# Patient Record
Sex: Female | Born: 1965
Health system: Southern US, Community
[De-identification: ages and names within clinical notes are randomized; demographics above are authoritative.]

## PROBLEM LIST (undated history)

## (undated) DIAGNOSIS — K5790 Diverticulosis of intestine, part unspecified, without perforation or abscess without bleeding: Secondary | ICD-10-CM

## (undated) DIAGNOSIS — R112 Nausea with vomiting, unspecified: Secondary | ICD-10-CM

## (undated) DIAGNOSIS — I10 Essential (primary) hypertension: Secondary | ICD-10-CM

## (undated) DIAGNOSIS — M797 Fibromyalgia: Secondary | ICD-10-CM

## (undated) DIAGNOSIS — E119 Type 2 diabetes mellitus without complications: Secondary | ICD-10-CM

## (undated) DIAGNOSIS — M1990 Unspecified osteoarthritis, unspecified site: Secondary | ICD-10-CM

## (undated) DIAGNOSIS — Z9889 Other specified postprocedural states: Secondary | ICD-10-CM

## (undated) DIAGNOSIS — K56609 Unspecified intestinal obstruction, unspecified as to partial versus complete obstruction: Secondary | ICD-10-CM

## (undated) DIAGNOSIS — G4730 Sleep apnea, unspecified: Secondary | ICD-10-CM

## (undated) DIAGNOSIS — F909 Attention-deficit hyperactivity disorder, unspecified type: Secondary | ICD-10-CM

## (undated) HISTORY — DX: Sleep apnea, unspecified: G47.30

## (undated) HISTORY — PX: OOPHORECTOMY: SHX86

## (undated) HISTORY — PX: COLONOSCOPY: SHX174

## (undated) HISTORY — PX: ABDOMINAL HYSTERECTOMY: SHX81

## (undated) HISTORY — DX: Other specified postprocedural states: Z98.890

## (undated) HISTORY — DX: Diverticulosis of intestine, part unspecified, without perforation or abscess without bleeding: K57.90

## (undated) HISTORY — PX: TOTAL ABDOMINAL HYSTERECTOMY: SHX209

## (undated) HISTORY — PX: MYOMECTOMY: SHX85

## (undated) HISTORY — DX: Unspecified osteoarthritis, unspecified site: M19.90

## (undated) HISTORY — DX: Other specified postprocedural states: R11.2

## (undated) HISTORY — PX: LAPAROSCOPY: SHX197

---

## 2008-07-08 ENCOUNTER — Inpatient Hospital Stay: Payer: Self-pay | Admitting: Unknown Physician Specialty

## 2015-09-12 DIAGNOSIS — G4733 Obstructive sleep apnea (adult) (pediatric): Secondary | ICD-10-CM | POA: Insufficient documentation

## 2015-09-12 DIAGNOSIS — Z9989 Dependence on other enabling machines and devices: Secondary | ICD-10-CM

## 2015-09-12 DIAGNOSIS — Z9109 Other allergy status, other than to drugs and biological substances: Secondary | ICD-10-CM | POA: Insufficient documentation

## 2015-09-12 DIAGNOSIS — F419 Anxiety disorder, unspecified: Secondary | ICD-10-CM | POA: Insufficient documentation

## 2015-09-12 DIAGNOSIS — I10 Essential (primary) hypertension: Secondary | ICD-10-CM | POA: Insufficient documentation

## 2017-01-06 DIAGNOSIS — F331 Major depressive disorder, recurrent, moderate: Secondary | ICD-10-CM | POA: Insufficient documentation

## 2017-01-06 DIAGNOSIS — F411 Generalized anxiety disorder: Secondary | ICD-10-CM | POA: Insufficient documentation

## 2017-06-05 ENCOUNTER — Encounter: Payer: Self-pay | Admitting: Osteopathic Medicine

## 2017-06-09 ENCOUNTER — Encounter: Payer: Self-pay | Admitting: Osteopathic Medicine

## 2017-11-02 LAB — HM MAMMOGRAPHY

## 2018-02-27 ENCOUNTER — Ambulatory Visit (INDEPENDENT_AMBULATORY_CARE_PROVIDER_SITE_OTHER): Payer: No Typology Code available for payment source | Admitting: Osteopathic Medicine

## 2018-02-27 ENCOUNTER — Telehealth: Payer: Self-pay

## 2018-02-27 ENCOUNTER — Encounter: Payer: Self-pay | Admitting: Osteopathic Medicine

## 2018-02-27 VITALS — BP 138/81 | HR 66 | Temp 98.1°F | Ht 66.54 in | Wt 226.5 lb

## 2018-02-27 DIAGNOSIS — R21 Rash and other nonspecific skin eruption: Secondary | ICD-10-CM

## 2018-02-27 DIAGNOSIS — Z1211 Encounter for screening for malignant neoplasm of colon: Secondary | ICD-10-CM | POA: Insufficient documentation

## 2018-02-27 DIAGNOSIS — N8110 Cystocele, unspecified: Secondary | ICD-10-CM | POA: Insufficient documentation

## 2018-02-27 DIAGNOSIS — Z87898 Personal history of other specified conditions: Secondary | ICD-10-CM

## 2018-02-27 DIAGNOSIS — Z8659 Personal history of other mental and behavioral disorders: Secondary | ICD-10-CM | POA: Diagnosis not present

## 2018-02-27 DIAGNOSIS — G4733 Obstructive sleep apnea (adult) (pediatric): Secondary | ICD-10-CM

## 2018-02-27 DIAGNOSIS — Z8639 Personal history of other endocrine, nutritional and metabolic disease: Secondary | ICD-10-CM | POA: Diagnosis not present

## 2018-02-27 DIAGNOSIS — M2550 Pain in unspecified joint: Secondary | ICD-10-CM | POA: Insufficient documentation

## 2018-02-27 DIAGNOSIS — Z90710 Acquired absence of both cervix and uterus: Secondary | ICD-10-CM | POA: Insufficient documentation

## 2018-02-27 DIAGNOSIS — K7581 Nonalcoholic steatohepatitis (NASH): Secondary | ICD-10-CM | POA: Insufficient documentation

## 2018-02-27 DIAGNOSIS — Z9989 Dependence on other enabling machines and devices: Secondary | ICD-10-CM

## 2018-02-27 DIAGNOSIS — F900 Attention-deficit hyperactivity disorder, predominantly inattentive type: Secondary | ICD-10-CM | POA: Insufficient documentation

## 2018-02-27 DIAGNOSIS — G8929 Other chronic pain: Secondary | ICD-10-CM

## 2018-02-27 DIAGNOSIS — E039 Hypothyroidism, unspecified: Secondary | ICD-10-CM | POA: Insufficient documentation

## 2018-02-27 DIAGNOSIS — M549 Dorsalgia, unspecified: Secondary | ICD-10-CM

## 2018-02-27 HISTORY — DX: Acquired absence of both cervix and uterus: Z90.710

## 2018-02-27 HISTORY — DX: Personal history of other specified conditions: Z87.898

## 2018-02-27 MED ORDER — CLOTRIMAZOLE-BETAMETHASONE 1-0.05 % EX CREA: 1.0000 "application " | TOPICAL_CREAM | Freq: Two times a day (BID) | CUTANEOUS | 0 refills | Status: DC

## 2018-02-27 MED ORDER — HYDROCHLOROTHIAZIDE 25 MG PO TABS: 25.0000 mg | ORAL_TABLET | Freq: Every day | ORAL | 1 refills | Status: DC

## 2018-02-27 NOTE — Telephone Encounter (Signed)
Sent!

## 2018-02-27 NOTE — Progress Notes (Signed)
HPI: Melanie [name] is a 52 y.o. female who  has no past medical history on file.  she presents to [facility] today, 02/27/18,  for chief complaint of: New to establish  Pleasant new patient here to establish care.  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  No complaints to address today.  We spent some time going over previous medical history.   CARDIOVASCULAR Essential hypertension, controlled on hydrochlorothiazide 25 mg Hyperlipidemia: Per patient, last available records reviewed from 2017: Total cholesterol 248, triglyceride 308, HDL 51, LDL 135.  NEUROLOGICAL/PSYCH History of anxiety depression, later attributed mostly to attention deficit disorder.  Patient states her moods tend to go in waves/cycles throughout the year, typically worse in the darker winter months. Limited psych records available - last visit appears to be 09/04/2017 w/ Dr Antony Contras. Meds at that time Wellbutrin and Lexapro. Dx MDD, ADHD, GAD.  Hx sleep apnea, patient is using CPAP and doing well on this device.  REPRODUCTIVE Status post hysterectomy for fibroids.  History of GYN abscess/ovarian tumor.  Would also like to get set up with GYN, has some concerns about possible vaginal prolapse, mom uses a pessary, she is concerned that this might be something that she could use.  GASTROINTESTINAL Previous colonoscopy done several years ago.  Requests referral for colonoscopy for colon cancer screening purposes.  Ports history of [place]/evaded liver enzymes.  ENDOCRINE History of hypothyroidism.  Previously on Armour Thyroid, not on any thyroid replacement right now. Deviously following with [facility]. History of PCOS History of prediabetes  MUSCULOSKELETAL/RHEUM Previously followed with rheumatologist.  Intermittent joint pain.  HEENT  SKIN Rash: on L hand between 3rd and 4th digits (see photo).  Cracking, has been present  for about a week or so.  Thinks is getting worse since she has to use alcohol to wash her hands frequently at work.  Other: Patient is not particularly clear about which supplementation/over-the-counter medicines she is taking or was previously prescribed by [facility].  She states that she may be thinking about getting back on magnesium, zinc, berberine  Last available records from previous PCP 12/07/2016 reviewed. EKG ok (obtained for clearance prior to starting stimulant meds w/ psych), concern for UTI.        Past medical, surgical, social and family history reviewed:  Patient Active Problem List   Diagnosis Date Noted  . Attention deficit hyperactivity disorder (ADHD), predominantly inattentive type 02/27/2018  . Arthralgia 02/27/2018  . Vaginal prolapse 02/27/2018  . Screening for malignant neoplasm of colon 02/27/2018  . H/O: hysterectomy 02/27/2018  . History of prediabetes 02/27/2018  . History of hypothyroidism 02/27/2018  . History of depression 02/27/2018  . History of anxiety 02/27/2018  . History of ADHD 02/27/2018  . NASH (nonalcoholic steatohepatitis) 02/27/2018  . GAD (generalized anxiety disorder) 01/06/2017  . Moderate episode of recurrent major depressive disorder ([place]) 01/06/2017  . Anxiety 09/12/2015  . Environmental allergies 09/12/2015  . Essential hypertension 09/12/2015  . OSA on CPAP 09/12/2015    History reviewed. No pertinent surgical history.  Social History   Tobacco Use  . Smoking status: Never Smoker  . Smokeless tobacco: Never Used  Substance Use Topics  . Alcohol use: Not Currently    History reviewed. No pertinent family history.   Current medication list and allergy/intolerance information reviewed:    Hydrochlorothiazide 25 mg daily  Allergies  Allergen Reactions  . Sulfa Antibiotics Hives      Review  of Systems:  Constitutional:  No  fever, no chills, No recent illness, No unintentional weight changes. No significant  fatigue.   HEENT: No  headache, no vision change, no hearing change, No sore throat, No  sinus pressure  Cardiac: No  chest pain, No  pressure, No palpitations, No  Orthopnea  Respiratory:  No  shortness of breath. No  Cough  Gastrointestinal: No  abdominal pain, No  nausea, No  vomiting,  No  blood in stool, No  diarrhea, No  constipation   Musculoskeletal: No new myalgia/arthralgia  Skin: +Rash, No other wounds/concerning lesions  Genitourinary: No  incontinence, No  abnormal genital bleeding, No abnormal genital discharge  Hem/Onc: No  easy bruising/bleeding, No  abnormal lymph node  Endocrine: No cold intolerance,  No heat intolerance. No polyuria/polydipsia/polyphagia   Neurologic: No  weakness, No  dizziness, No  slurred speech/focal weakness/facial droop  Psychiatric: No  concerns with depression, No  concerns with anxiety, No sleep problems, No mood problems  Exam:  BP 138/81 (BP Location: Left Arm, Patient Position: Sitting, Cuff Size: Large)   Pulse 66   Temp 98.1 F (36.7 C) (Oral)   Ht 5' 6.54" (1.69 m)   Wt 226 lb 8 oz (102.7 kg)   BMI 35.97 kg/m   Constitutional: VS see above. General Appearance: alert, well-developed, well-nourished, NAD  Eyes: Normal lids and conjunctive, non-icteric sclera  Ears, Nose, Mouth, Throat: MMM, Normal external inspection ears/nares/mouth/lips/gums. TM normal bilaterally. Pharynx/tonsils no erythema, no exudate. Nasal mucosa normal.   Neck: No masses, trachea midline. No thyroid enlargement. No tenderness/mass appreciated. No lymphadenopathy  Respiratory: Normal respiratory effort. no wheeze, no rhonchi, no rales  Cardiovascular: S1/S2 normal, no murmur, no rub/gallop auscultated. RRR. No lower extremity edema. Pedal pulse II/IV bilaterally DP and PT. No carotid bruit or JVD. No abdominal aortic bruit.  Gastrointestinal: Nontender, no masses. No hepatomegaly, no splenomegaly. No hernia appreciated. Bowel sounds normal. Rectal  exam deferred.   Musculoskeletal: Gait normal. No clubbing/cyanosis of digits.   Neurological: Normal balance/coordination. No tremor. No cranial nerve deficit on limited exam. Motor and sensation intact and symmetric. Cerebellar reflexes intact.   Skin: warm, dry. See photo of rash: No concerning nevi or subq nodules on limited exam.    Psychiatric: Normal judgment/insight. Normal mood and affect. Oriented x3. A bit disorganized/flighty but normal thought process                    ASSESSMENT/PLAN: The primary encounter diagnosis was Rash and nonspecific skin eruption. Diagnoses of NASH (nonalcoholic steatohepatitis), History of ADHD, History of anxiety, History of depression, History of hypothyroidism, History of prediabetes, OSA on CPAP, H/O: hysterectomy, Screening for malignant neoplasm of colon, Vaginal prolapse, Arthralgia, unspecified joint, and Chronic bilateral back pain, unspecified back location were also pertinent to this visit.  Chronic medical conditions overall seems stable, will of course wait on lab results for certain things particularly thyroid levels.  Rash seems consistent with possible eczema, irritated by frequent handwashing/alcohol use at work.  Possible fungal superinfection, will treat with Lotrisone and consider biopsy/dermatology referral if not improving as expected.  Believable ADHD history.   Orders Placed This Encounter  Procedures  . CBC  . COMPLETE METABOLIC PANEL WITH GFR  . Lipid panel  . TSH  . T4, free  . Hemoglobin A1c  . Ambulatory referral to Gastroenterology  . Ambulatory referral to Obstetrics / Gynecology  . Ambulatory referral to Chiropractic       Visit summary  with medication list and pertinent instructions was printed for patient to review. All questions at time of visit were answered - patient instructed to contact office with any additional concerns. ER/RTC precautions were reviewed with the patient.   Follow-up  plan: Return for annual wellness physical sometime next few months, otherwise as needed for other medical concerns .   Please note: voice recognition software was used to produce this document, and typos may escape review. Please contact Dr. Sheppard Coil for any needed clarifications.

## 2018-02-27 NOTE — Telephone Encounter (Signed)
Pt returned to clinic - she requested meds to be transferred to [place]. I've called and cancelled rxs sent to [facility]. Pls send rxs to [facility] on file. Thanks.

## 2018-02-28 LAB — CBC
HEMATOCRIT: 43.2 % (ref 35.0–45.0)
HEMOGLOBIN: 15.1 g/dL (ref 11.7–15.5)
MCH: 30.3 pg (ref 27.0–33.0)
MCHC: 35 g/dL (ref 32.0–36.0)
MCV: 86.7 fL (ref 80.0–100.0)
MPV: 11.5 fL (ref 7.5–12.5)
Platelets: 343 10*3/uL (ref 140–400)
RBC: 4.98 10*6/uL (ref 3.80–5.10)
RDW: 11.8 % (ref 11.0–15.0)
WBC: 5.4 10*3/uL (ref 3.8–10.8)

## 2018-02-28 LAB — COMPLETE METABOLIC PANEL WITH GFR
AG RATIO: 1.3 (calc) (ref 1.0–2.5)
ALBUMIN MSPROF: 4.4 g/dL (ref 3.6–5.1)
ALT: 64 U/L — ABNORMAL HIGH (ref 6–29)
AST: 39 U/L — ABNORMAL HIGH (ref 10–35)
Alkaline phosphatase (APISO): 95 U/L (ref 33–130)
BUN: 12 mg/dL (ref 7–25)
CALCIUM: 9.8 mg/dL (ref 8.6–10.4)
CO2: 27 mmol/L (ref 20–32)
CREATININE: 0.75 mg/dL (ref 0.50–1.05)
Chloride: 102 mmol/L (ref 98–110)
GFR, EST AFRICAN AMERICAN: 108 mL/min/{1.73_m2} (ref >=60)
GFR, EST NON AFRICAN AMERICAN: 93 mL/min/{1.73_m2} (ref >=60)
GLOBULIN: 3.4 g/dL (ref 1.9–3.7)
Glucose, Bld: 112 mg/dL — ABNORMAL HIGH (ref 65–99)
POTASSIUM: 3.9 mmol/L (ref 3.5–5.3)
SODIUM: 139 mmol/L (ref 135–146)
TOTAL PROTEIN: 7.8 g/dL (ref 6.1–8.1)
Total Bilirubin: 0.6 mg/dL (ref 0.2–1.2)

## 2018-02-28 LAB — LIPID PANEL
CHOL/HDL RATIO: 5.2 (calc) — AB (ref <5.0)
Cholesterol: 283 mg/dL — ABNORMAL HIGH (ref <200)
HDL: 54 mg/dL (ref >50)
LDL Cholesterol (Calc): 179 mg/dL (calc) — ABNORMAL HIGH
NON-HDL CHOLESTEROL (CALC): 229 mg/dL — AB (ref <130)
Triglycerides: 281 mg/dL — ABNORMAL HIGH (ref <150)

## 2018-02-28 LAB — TSH: TSH: 2.81 m[IU]/L

## 2018-02-28 LAB — HEMOGLOBIN A1C
HEMOGLOBIN A1C: 6.2 %{Hb} — AB (ref <5.7)
MEAN PLASMA GLUCOSE: 131 (calc)
eAG (mmol/L): 7.3 (calc)

## 2018-02-28 LAB — T4, FREE: FREE T4: 1.1 ng/dL (ref 0.8–1.8)

## 2018-03-14 MED FILL — HYDROCHLOROTHIAZIDE 25 MG T: 25 | 90 days supply | Qty: 90 | Fill #0

## 2018-03-23 ENCOUNTER — Telehealth: Payer: Self-pay

## 2018-03-23 ENCOUNTER — Telehealth: Payer: Self-pay | Admitting: Nurse Practitioner

## 2018-03-23 DIAGNOSIS — R059 Cough, unspecified: Secondary | ICD-10-CM

## 2018-03-23 DIAGNOSIS — R05 Cough: Secondary | ICD-10-CM

## 2018-03-23 MED ORDER — BENZONATATE 100 MG PO CAPS: 100.0000 mg | ORAL_CAPSULE | Freq: Three times a day (TID) | ORAL | 0 refills | Status: DC | PRN

## 2018-03-23 NOTE — Telephone Encounter (Signed)
Pt called requesting a rx for benzonatate pills. As per pt, thinks she has URI. Pt was given a appt for 03/26/18. Pls advise if rx is appropriate. Thanks.

## 2018-03-23 NOTE — Progress Notes (Signed)

## 2018-03-24 MED ORDER — BENZONATATE 100 MG PO CAPS: 100.0000 mg | ORAL_CAPSULE | Freq: Three times a day (TID) | ORAL | 0 refills | Status: DC | PRN

## 2018-03-24 NOTE — Telephone Encounter (Signed)
Went ahead and sent medication into [facility]

## 2018-03-26 ENCOUNTER — Ambulatory Visit: Payer: Self-pay | Admitting: Osteopathic Medicine

## 2018-03-26 NOTE — Telephone Encounter (Signed)
Noted. Pt received a call from pharmacy regarding rx is ready to be pick up.

## 2018-03-27 ENCOUNTER — Telehealth: Payer: No Typology Code available for payment source | Admitting: Nurse Practitioner

## 2018-03-27 DIAGNOSIS — J00 Acute nasopharyngitis [common cold]: Secondary | ICD-10-CM

## 2018-03-27 NOTE — Progress Notes (Signed)

## 2018-03-28 ENCOUNTER — Ambulatory Visit (INDEPENDENT_AMBULATORY_CARE_PROVIDER_SITE_OTHER): Payer: No Typology Code available for payment source | Admitting: Physician Assistant

## 2018-03-28 ENCOUNTER — Encounter: Payer: Self-pay | Admitting: Physician Assistant

## 2018-03-28 VITALS — BP 130/83 | HR 82 | Temp 98.9°F | Ht 66.54 in | Wt 226.5 lb

## 2018-03-28 DIAGNOSIS — R059 Cough, unspecified: Secondary | ICD-10-CM

## 2018-03-28 DIAGNOSIS — R05 Cough: Secondary | ICD-10-CM

## 2018-03-28 DIAGNOSIS — J0100 Acute maxillary sinusitis, unspecified: Secondary | ICD-10-CM | POA: Diagnosis not present

## 2018-03-28 MED ORDER — HYDROCODONE-HOMATROPINE 5-1.5 MG/5ML PO SYRP: 5.0000 mL | ORAL_SOLUTION | Freq: Two times a day (BID) | ORAL | 0 refills | Status: DC

## 2018-03-28 MED ORDER — PREDNISONE 20 MG PO TABS: ORAL_TABLET | ORAL | 0 refills | Status: DC

## 2018-03-28 NOTE — Progress Notes (Signed)
Subjective:     Patient ID: Melanie [name], female   DOB: June 07, 1966, 52 y.o.   MRN: [id number]  HPI Patient is a 52 yo female presenting today complaining of a cough and sinus pressure. She states that she started feeling sick last Sunday and by Tuesday she had developed a cough. She had an E-visit on 03/23/18 and was told to use conservative management of ibuprofen and Tessalon Perles. She tried this but a few days later she developed congestion, sinus pain, a headache, and some ear pain. She had another E-visit yesteday (03/27/18) and was prescribed Augmentin for sinusitis. She has taken 3 doses of the Augmentin and does state that the sinus pain, ear pain, and headache have lessened. In addition to the [employer], she also tried Mucinex, sudafed, and takes an albuterol inhaler at night for the cough. She still complains of a cough, postnasal drip, and states that her throat feels irritated. She admits to some exertional SOB and states that her chest feels tight but says it might be from coughing. She denies chest pain. She denies fever or chills. She states she feels like she is not getting better, and just feels worn out and weak. 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   .. Active Ambulatory Problems    Diagnosis Date Noted  . Anxiety 09/12/2015  . Attention deficit hyperactivity disorder (ADHD), predominantly inattentive type 02/27/2018  . Environmental allergies 09/12/2015  . Essential hypertension 09/12/2015  . GAD (generalized anxiety disorder) 01/06/2017  . Moderate episode of recurrent major depressive disorder ([place]) 01/06/2017  . OSA on CPAP 09/12/2015  . Arthralgia 02/27/2018  . Vaginal prolapse 02/27/2018  . Screening for malignant neoplasm of colon 02/27/2018  . H/O: hysterectomy 02/27/2018  . History of prediabetes 02/27/2018  . History of hypothyroidism 02/27/2018  . History of depression 02/27/2018  . History of anxiety 02/27/2018  .  History of ADHD 02/27/2018  . NASH (nonalcoholic steatohepatitis) 02/27/2018   Resolved Ambulatory Problems    Diagnosis Date Noted  . No Resolved Ambulatory Problems   No Additional Past Medical History    .  Review of Systems  Constitutional: Positive for fatigue. Negative for chills and fever.  HENT: Positive for congestion, postnasal drip, rhinorrhea, sinus pressure, sinus pain and sore throat.   Respiratory: Positive for cough, chest tightness and shortness of breath.   Cardiovascular: Negative for chest pain.  Neurological: Positive for headaches.       Objective:   Physical Exam  Constitutional: She is oriented to person, place, and time. She appears well-developed and well-nourished.  HENT:  Head: Normocephalic and atraumatic.  Right Ear: Tympanic membrane and external ear normal.  Left Ear: Tympanic membrane, external ear and ear canal normal.  Nose: Rhinorrhea present.  Mouth/Throat: Posterior oropharyngeal erythema present.  Nasal mucousa is erythematous and irritated.  Cardiovascular: Normal rate and regular rhythm.  Pulmonary/Chest: Effort normal and breath sounds normal. She has no wheezes.  Neurological: She is alert and oriented to person, place, and time.  Psychiatric: She has a normal mood and affect. Her behavior is normal.       Assessment:     Marland KitchenMarland KitchenDiagnoses and all orders for this visit:  Cough -     predniSONE (DELTASONE) 20 MG tablet; One tablet twice a day for 5 days. -     HYDROcodone-homatropine (HYCODAN) 5-1.5 MG/5ML syrup; Take 5 mLs by mouth 2 (two) times daily.  Acute non-recurrent maxillary sinusitis -  predniSONE (DELTASONE) 20 MG tablet; One tablet twice a day for 5 days.       Plan:     Discussed importance of continuing to take and finish Augmentin to help clear up the lingering sinusitis. Should start to feel better with continued use of the antibiotic Prescribed oral prednisone to help alleviate the cough and reduce  inflammation. Explained that prednisone should help reduce the cough. Continue to use albuterol as needed Prescribed Hycodan for next 5 days to help with the cough at night. Instructed patient to take before bed.   Discussed importance of rest. Can use cough drops or lozenges to help reduce coughing while at work.  [place] controlled substance database reviewed without concerns.    Marland KitchenVernetta Honey PA-C, have reviewed and agree with the above documentation in it's entirety.

## 2018-03-28 NOTE — Patient Instructions (Signed)

## 2018-03-29 ENCOUNTER — Encounter: Payer: Self-pay | Admitting: Physician Assistant

## 2018-04-02 ENCOUNTER — Encounter: Payer: Self-pay | Admitting: Obstetrics & Gynecology

## 2018-04-02 ENCOUNTER — Ambulatory Visit (INDEPENDENT_AMBULATORY_CARE_PROVIDER_SITE_OTHER): Payer: No Typology Code available for payment source | Admitting: Obstetrics & Gynecology

## 2018-04-02 VITALS — BP 127/86 | HR 77 | Ht 67.0 in | Wt 225.0 lb

## 2018-04-02 DIAGNOSIS — N952 Postmenopausal atrophic vaginitis: Secondary | ICD-10-CM | POA: Diagnosis not present

## 2018-04-02 MED ORDER — ESTRADIOL 10 MCG VA TABS: ORAL_TABLET | VAGINAL | 6 refills | Status: DC

## 2018-04-02 NOTE — Progress Notes (Signed)
   Subjective:    Patient ID: Melanie [name], female    DOB: [birth year], 52 y.o.   MRN: [id number]  HPI  Pt presents for first visit.  She had a TAH BSO many years ago with dr. Clarene Essex in [place] due to a large ovarian cyst (it was not cancer).  Pt does not have a cervix, all nml paps, and cervical pathology benign on hysterectomy specimen.  Pt feels a heaviness and pain at times deep on perineum / vagina when she is pushing stretchers at work.  It hasn't happened recently.  Not present today. Pt has not taken anything for the pain.  Pt wonders if there is prolapse causing the heaviness.  No discharge.  Not sexually active at this time.   Review of Systems  Constitutional: Negative.   Respiratory: Negative.   Cardiovascular: Negative.   Gastrointestinal: Negative.   Genitourinary:       Vaginal heaviness att times  Musculoskeletal:       Pulling sensation when pushing stretchers, right side  Psychiatric/Behavioral: Negative.        Objective:   Physical Exam  Constitutional: She is oriented to person, place, and time. She appears well-developed and well-nourished. No distress.  HENT:  Head: Normocephalic and atraumatic.  Eyes: Conjunctivae are normal.  Cardiovascular: Normal rate.  Pulmonary/Chest: Effort normal.  Abdominal: Soft. Bowel sounds are normal. She exhibits no distension and no mass. There is no tenderness. There is no rebound and no guarding.  Genitourinary:  Genitourinary Comments: Tanner V No rectocele, cystocele, vaginal prolapse Uterus and adnexa surgically absent +atrohpic vaginitis  Musculoskeletal: She exhibits no edema.  Neurological: She is alert and oriented to person, place, and time.  Skin: Skin is warm and dry.  Psychiatric: She has a normal mood and affect.  Vitals reviewed.  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  Atrohpic vaginitis--Rx with vagifem 2.  If not better, can refer to pelvic PT for evaluation 3.  Pap smear not indicated due to histroy above 4.  Mammogram--I don't see one in chart; will have RN look nad call patient if has not received one.  30 minutes spent face to face with patient with >50% counseling

## 2018-04-06 ENCOUNTER — Telehealth: Payer: Self-pay

## 2018-04-06 DIAGNOSIS — F419 Anxiety disorder, unspecified: Secondary | ICD-10-CM

## 2018-04-06 DIAGNOSIS — F900 Attention-deficit hyperactivity disorder, predominantly inattentive type: Secondary | ICD-10-CM

## 2018-04-06 NOTE — Telephone Encounter (Signed)
Pt called requesting a [place] referral. Pt would like referral to be sent to our [facility] providers in our building [address] [address]). Thanks.

## 2018-04-09 NOTE — Telephone Encounter (Signed)
Spoke with Pt, she is looking for a combination of both talk therapy and medication management. Routing back to PCP.

## 2018-04-09 NOTE — Telephone Encounter (Signed)
I am happy to place the referral.  Was she specific whether she wanted to see a counselor to talk therapy, psychiatrist for medication management, or both? Referral pended

## 2018-04-09 NOTE — Telephone Encounter (Signed)
Pt has been updated. No other inquiries during call.

## 2018-04-09 NOTE — Telephone Encounter (Signed)
Okay, order is in for referral to behavioral health downstairs for counseling and for psychiatry.  Thank you!

## 2018-04-23 ENCOUNTER — Ambulatory Visit (INDEPENDENT_AMBULATORY_CARE_PROVIDER_SITE_OTHER): Payer: No Typology Code available for payment source | Admitting: Osteopathic Medicine

## 2018-04-23 DIAGNOSIS — Z23 Encounter for immunization: Secondary | ICD-10-CM | POA: Diagnosis not present

## 2018-05-17 MED FILL — ESTRADIOL 10 MCG TABS: 10 | 30 days supply | Qty: 18 | Fill #0

## 2018-05-18 ENCOUNTER — Ambulatory Visit (HOSPITAL_COMMUNITY): Payer: Self-pay | Admitting: Licensed Clinical Social Worker

## 2018-05-21 ENCOUNTER — Ambulatory Visit (HOSPITAL_COMMUNITY): Payer: Self-pay | Admitting: Psychiatry

## 2018-05-22 ENCOUNTER — Ambulatory Visit (HOSPITAL_COMMUNITY): Payer: Self-pay | Admitting: Licensed Clinical Social Worker

## 2018-05-30 ENCOUNTER — Ambulatory Visit (HOSPITAL_COMMUNITY): Payer: Self-pay | Admitting: Psychiatry

## 2018-06-04 ENCOUNTER — Encounter (HOSPITAL_COMMUNITY): Payer: Self-pay | Admitting: Psychiatry

## 2018-06-04 ENCOUNTER — Other Ambulatory Visit: Payer: Self-pay

## 2018-06-04 ENCOUNTER — Ambulatory Visit (INDEPENDENT_AMBULATORY_CARE_PROVIDER_SITE_OTHER): Payer: No Typology Code available for payment source | Admitting: Psychiatry

## 2018-06-04 VITALS — BP 144/100 | HR 89 | Ht 67.0 in | Wt 231.0 lb

## 2018-06-04 DIAGNOSIS — F900 Attention-deficit hyperactivity disorder, predominantly inattentive type: Secondary | ICD-10-CM

## 2018-06-04 DIAGNOSIS — F331 Major depressive disorder, recurrent, moderate: Secondary | ICD-10-CM | POA: Diagnosis not present

## 2018-06-04 DIAGNOSIS — F411 Generalized anxiety disorder: Secondary | ICD-10-CM | POA: Diagnosis not present

## 2018-06-04 MED ORDER — BUPROPION HCL ER (SR) 100 MG PO TB12: 100.0000 mg | ORAL_TABLET | Freq: Two times a day (BID) | ORAL | 1 refills | Status: DC

## 2018-06-04 MED FILL — buPROPion HCL ER (SR) 100 M: 100 | 30 days supply | Qty: 60 | Fill #0

## 2018-06-04 MED FILL — HYDROCHLOROTHIAZIDE 25 MG T: 25 | 90 days supply | Qty: 90 | Fill #1

## 2018-06-04 NOTE — Progress Notes (Signed)
Psychiatric Initial Adult Assessment   Patient Identification: Melanie [name] MRN:  [id number] Date of Evaluation:  06/04/2018 Referral Source: primary care Chief Complaint:   Chief Complaint    Establish Care     Visit Diagnosis:    ICD-10-CM   1. Moderate episode of recurrent major depressive disorder (HCC) F33.1   2. GAD (generalized anxiety disorder) F41.1   3. Attention deficit hyperactivity disorder (ADHD), predominantly inattentive type F90.0     History of Present Illness: 52 years old currently single Caucasian female referred by primary care physician for management of depression and anxiety. Works as Therapist, sports at [employer]   Patient has been on different medication the past but became noncompliant states Wellbutrin has helped in the past.  She is feeling down depressed a motivation decreased energy crying spells says holiday season is difficult for her because she does not have any kids States medication did help was Wellbutrin but then she stopped it.  She is currently working as a Therapist, sports at [employer] worries excessive at times she has difficulty sleeping or maintaining sleep.  She worries about her future she does not have any kids she worries about her mom and finances. Says more dysphoric as holiday season is here and gets dysphoric during this and winter time.   States she has ADHD since early years but was never treated in the past when she was a kid but then later on she has been on different medication but she believes she is still functioning at work because she tries to keep herself busy but she does get distracted at times at home cannot finish chores gets lazy and forgetful  Modifying factor mom Aggravating factors; being single no kids  Duration on and off for 5 to 10 years  Severity of depression is 4 out of 10.  10 being no depression No associated psychotic symptoms no manic symptoms currently or in the past Denies drug  Has seen Psychiatrist in  [place] for 2 years. She left her practice, later patient stopped her meds.   Past Psychiatric History: depression  Previous Psychotropic Medications: Yes   Substance Abuse History in the last 12 months:  No.  Consequences of Substance Abuse: NA  Past Medical History: History reviewed. No pertinent past medical history.  Past Surgical History:  Procedure Laterality Date  . ABDOMINAL HYSTERECTOMY    . MYOMECTOMY      Family Psychiatric History: depression. Mom has depression, sister has Depression, anxiety  Family History:  Family History  Problem Relation Age of Onset  . Lung cancer Father   . Diabetes Mother   . Skin cancer Mother     Social History:   Social History   Socioeconomic History  . Marital status: Divorced    Spouse name: Not on file  . Number of children: Not on file  . Years of education: Not on file  . Highest education level: Not on file  Occupational History  . Not on file  Social Needs  . Financial resource strain: Not on file  . Food insecurity:    Worry: Not on file    Inability: Not on file  . Transportation needs:    Medical: Not on file    Non-medical: Not on file  Tobacco Use  . Smoking status: Never Smoker  . Smokeless tobacco: Never Used  Substance and Sexual Activity  . Alcohol use: Yes    Alcohol/week: 1.0 standard drinks    Types: 1 Cans of  beer per week    Comment: occ  . Drug use: Never  . Sexual activity: Not Currently    Birth control/protection: Surgical  Lifestyle  . Physical activity:    Days per week: Not on file    Minutes per session: Not on file  . Stress: Not on file  Relationships  . Social connections:    Talks on phone: Not on file    Gets together: Not on file    Attends religious service: Not on file    Active member of club or organization: Not on file    Attends meetings of clubs or organizations: Not on file    Relationship status: Not on file  Other Topics Concern  . Not on file  Social  History Narrative  . Not on file    Additional Social History: grew up with parents, GM . Somewhat stressed growing up. DAd was alcoholic, there was bickering at home . No physical trauma Married once he was controlling so she left. No kids   Allergies:   Allergies  Allergen Reactions  . Clarithromycin Other (See Comments) and Rash    Other Reaction: Fever  . Morphine Other (See Comments)    Other Reaction: mental status alerted  . Sulfa Antibiotics Hives    Metabolic Disorder Labs: Lab Results  Component Value Date   HGBA1C 6.2 (H) 02/27/2018   MPG 131 02/27/2018   No results found for: PROLACTIN Lab Results  Component Value Date   CHOL 283 (H) 02/27/2018   TRIG 281 (H) 02/27/2018   HDL 54 02/27/2018   CHOLHDL 5.2 (H) 02/27/2018   LDLCALC 179 (H) 02/27/2018   Lab Results  Component Value Date   TSH 2.81 02/27/2018    Therapeutic Level Labs: No results found for: LITHIUM No results found for: CBMZ No results found for: VALPROATE  Current Medications: Current Outpatient Medications  Medication Sig Dispense Refill  . aspirin EC 81 MG tablet Take 81 mg by mouth daily.    . cholecalciferol (VITAMIN D3) 25 MCG (1000 UT) tablet Take 2,000 Units by mouth daily.    . Estradiol 10 MCG TABS vaginal tablet Insert one tablet vaginally nightly for 2 weeks then twice a week. 20 tablet 6  . hydrochlorothiazide (HYDRODIURIL) 25 MG tablet Take 1 tablet (25 mg total) by mouth daily. 90 tablet 1  . Omega-3 Fatty Acids (FISH OIL) 1000 MG CAPS Take 1,400 mg by mouth.    . vitamin B-12 (CYANOCOBALAMIN) 500 MCG tablet Take 2,500 mcg by mouth daily.    Marland Kitchen amoxicillin-clavulanate (AUGMENTIN) 250-125 MG tablet Take 1 tablet by mouth 3 (three) times daily.    . benzonatate (TESSALON PERLES) 100 MG capsule Take 1 capsule (100 mg total) by mouth 3 (three) times daily as needed for cough. (Patient not taking: Reported on 04/02/2018) 20 capsule 0  . buPROPion (WELLBUTRIN SR) 100 MG 12 hr tablet  Take 1 tablet (100 mg total) by mouth 2 (two) times daily. 60 tablet 1  . clotrimazole-betamethasone (LOTRISONE) cream Apply 1 application topically 2 (two) times daily. (Patient not taking: Reported on 04/02/2018) 45 g 0  . HYDROcodone-homatropine (HYCODAN) 5-1.5 MG/5ML syrup Take 5 mLs by mouth 2 (two) times daily. (Patient not taking: Reported on 04/02/2018) 50 mL 0  . predniSONE (DELTASONE) 20 MG tablet One tablet twice a day for 5 days. (Patient not taking: Reported on 04/02/2018) 10 tablet 0   No current facility-administered medications for this visit.     Musculoskeletal: Strength &  Muscle Tone: within normal limits Gait & Station: normal Patient leans: no lean  Psychiatric Specialty Exam: Review of Systems  Constitutional: Positive for malaise/fatigue.  Cardiovascular: Negative for chest pain.  Skin: Negative for rash.  Neurological: Negative for tremors.  Psychiatric/Behavioral: Positive for depression.    Blood pressure (!) 144/100, pulse 89, height 5' 7"  (1.702 m), weight 231 lb (104.8 kg).Body mass index is 36.18 kg/m.  General Appearance: Casual  Eye Contact:  Fair  Speech:  Normal Rate  Volume:  Decreased  Mood:  Depressed  Affect:  Congruent  Thought Process:  Goal Directed  Orientation:  Full (Time, Place, and Person)  Thought Content:  Logical  Suicidal Thoughts:  No  Homicidal Thoughts:  No  Memory:  Immediate;   Fair Recent;   Fair  Judgement:  Fair  Insight:  Fair  Psychomotor Activity:  Decreased  Concentration:  Concentration: Fair and Attention Span: Fair  Recall:  [employer] of Knowledge:Fair  Language: Good  Akathisia:  No  Handed:  Right  AIMS (if indicated):  not done  Assets:  Desire for Improvement  ADL's:  Intact  Cognition: WNL  Sleep:  Fair   Screenings: GAD-7     Office Visit from 06/04/2018 in [place]  Total GAD-7 Score  13    PHQ2-9     Office Visit from 06/04/2018 in [place] Office Visit from 02/27/2018 in [place]  PHQ-2 Total Score  4  1  PHQ-9 Total Score  19  8      Assessment and Plan: as follows  MDD , moderate recurrent: re start wellbutrin 166m increase to 2058min one week GAD: refer to therapy. Build up depression medication for now as it effects and makes anxiety worse  AdHD: functioning at job fair. Will keep wellbutrin  For now for adhd Says she does not have high blood pressure, had coffee and anxiety when came here today. Gets it checked by primary care. No chest pain .  More than 50% time spent in counseling and coordination of care including patient education reviewed side effects and concerns were addressed  Follow-up in 4 weeks or earlier if needed   NaMerian CapronMD 12/2/201911:40 AM

## 2018-06-06 ENCOUNTER — Ambulatory Visit (INDEPENDENT_AMBULATORY_CARE_PROVIDER_SITE_OTHER): Payer: No Typology Code available for payment source | Admitting: Licensed Clinical Social Worker

## 2018-06-06 DIAGNOSIS — F900 Attention-deficit hyperactivity disorder, predominantly inattentive type: Secondary | ICD-10-CM

## 2018-06-06 DIAGNOSIS — F411 Generalized anxiety disorder: Secondary | ICD-10-CM

## 2018-06-06 DIAGNOSIS — F331 Major depressive disorder, recurrent, moderate: Secondary | ICD-10-CM | POA: Diagnosis not present

## 2018-06-07 ENCOUNTER — Encounter (HOSPITAL_COMMUNITY): Payer: Self-pay | Admitting: Licensed Clinical Social Worker

## 2018-06-07 NOTE — Progress Notes (Signed)
Comprehensive Clinical Assessment (CCA) Note  06/07/2018 Melanie [name] [id number]  Visit Diagnosis:      ICD-10-CM   1. Moderate episode of recurrent major depressive disorder (HCC) F33.1   2. GAD (generalized anxiety disorder) F41.1   3. Attention deficit hyperactivity disorder (ADHD), predominantly inattentive type F90.0       CCA Part One  Part One has been completed on paper by the patient.  (See scanned document in Chart Review)  CCA Part Two A  Intake/Chief Complaint:  CCA Intake With Chief Complaint CCA Part Two Date: 06/06/18 CCA Part Two Time: 1514 Chief Complaint/Presenting Problem: Referred for concerns related to depression and anxiety Patients Currently Reported Symptoms/Problems: Tearful   Not wanting to get up in the morning.  Can't focus.  Mind races.  Upset with herself for not accomplishing as much as she would have liked during a period of time when she took time off work.  Notes this time of year has historically been difficult for her because it reminds her of things she doesn't have (family, children, a significant other)  Stays up until she is exhausted because otherwise she has trouble falling asleep Amount of sleep varies.  Appetite has varied, sometimes overeats and other times lacks appetite  Everything feels like it takes a big effort  Anxiety has been elevated.  Sometimes gets stuck on what ifs  Individual's Strengths: Mom is her main source of support  Has some friends  Says she is funny, caring, and can be creative and crafty Individual's Preferences: Wants to get back to her norm...feel like it doesn't take so much effort to do things.  Also wants to learn to accept things as they are. Type of Services Patient Feels Are Needed: Therapy and medication management Initial Clinical Notes/Concerns: Patient stopped taking psych meds in early summer.  Over time symptoms of depression and anxiety worsened to the point where she concluded she needed to  reestablish MH care.  Diagnosed with ADHD in the 2000s by her psychiatrist and therapist.  History of taking Ritalin and Adderall.  Has found them to be helpful.     Mental Health Symptoms Depression:  Depression: Difficulty Concentrating, Fatigue, Hopelessness, Increase/decrease in appetite, Irritability, Sleep (too much or little), Tearfulness, Worthlessness  Mania:     Anxiety:   Anxiety: Worrying, Tension, Sleep, Restlessness, Irritability, Fatigue, Difficulty concentrating  Psychosis:  Psychosis: N/A  Trauma:  Trauma: N/A  Obsessions:  Obsessions: N/A  Compulsions:  Compulsions: N/A  Inattention:  Inattention: Fails to pay attention/makes careless mistakes, Forgetful, Loses things, Poor follow-through on tasks, Does not seem to listen, Disorganized, Symptoms before age 61, Symptoms present in 2 or more settings(Has to write down things on paper to remember them)  Hyperactivity/Impulsivity:  Hyperactivity/Impulsivity: Feeling of restlessness, Difficulty waiting turn, Fidgets with hands/feet, Symptoms present before age 9, Several symptoms present in 2 of more settings  Oppositional/Defiant Behaviors:  Oppositional/Defiant Behaviors: N/A  Borderline Personality:  Emotional Irregularity: N/A  Other Mood/Personality Symptoms:      Mental Status Exam Appearance and self-care  Stature:  Stature: Average  Weight:  Weight: Obese  Clothing:  Clothing: Casual  Grooming:  Grooming: Normal  Cosmetic use:  Cosmetic Use: None  Posture/gait:  Posture/Gait: Normal  Motor activity:  Motor Activity: Not Remarkable  Sensorium  Attention:  Attention: Normal  Concentration:  Concentration: Normal  Orientation:  Orientation: X5  Recall/memory:  Recall/Memory: Normal  Affect and Mood  Affect:  Affect: Tearful  Mood:  Mood: Anxious, Depressed  Relating  Eye contact:  Eye Contact: Fleeting  Facial expression:  Facial Expression: Depressed  Attitude toward examiner:  Attitude Toward Examiner:  Cooperative  Thought and Language  Speech flow: Speech Flow: Normal  Thought content:     Preoccupation:  Preoccupations: Guilt  Hallucinations:     Organization:     Transport planner of Knowledge:  Fund of Knowledge: Average  Intelligence:  Intelligence: Average  Abstraction:  Abstraction: Normal  Judgement:  Judgement: Normal  Reality Testing:  Reality Testing: Adequate  Insight:  Insight: Fair  Decision Making:  Decision Making: Vacilates(Tends to [employer])  Social Functioning  Social Maturity:  Social Maturity: Isolates  Social Judgement:  Social Judgement: Normal  Stress  Stressors:  Stressors: Work  Coping Ability:  Coping Ability: Research officer, political party Deficits:     Supports:      Family and Psychosocial History: Family history Marital status: Single Does patient have children?: No  Childhood History:  Childhood History By whom was/is the patient raised?: Grandparents, Mother Additional childhood history information: Dad drank a lot  Mom was high strung  Sister acted out a lot  "We never had money.  It was rough."  Mom also had depression and anxiety. Patient's description of current relationship with people who raised him/her: Dad died in 11-Sep-1997.  Had lung cancer and COPD.  He had been sick for a long time.  Relationship with him had been good.    Mom "I love her to death.  She is my best friend.  Sometimes I want to pinch her head off though."  Sometimes feels like mom doesn't treat her like an adult.   Does patient have siblings?: Yes Number of Siblings: 1 Description of patient's current relationship with siblings: Sister, Maudie Mercury- 60 years older, not very close Lives in [place].   Did patient suffer any verbal/emotional/physical/sexual abuse as a child?: No Did patient suffer from severe childhood neglect?: No Has patient ever been sexually abused/assaulted/raped as an adolescent or adult?: No Was the patient ever a victim of a crime or a disaster?:  No Witnessed domestic violence?: (Parents argued a lot.  Sometimes threw things.) Has patient been effected by domestic violence as an adult?: No  CCA Part Two B  Employment/Work Situation: Employment / Work Situation Employment situation: Employed Where is patient currently employed?: [employer] as an Therapist, sports How long has patient been employed?: 6 months in current position  Education: Education Did Teacher, adult education From [employer]?: Yes Did Physicist, medical?: Yes What Type of College Degree Do you Have?: BA Did You Have Any Difficulty At School?: Yes(Notes it took a lot of effort to do well in school because of her issues with inattention) Were Any Medications Ever Prescribed For These Difficulties?: No  Religion: Religion/Spirituality Are You A Religious Person?: No  Leisure/Recreation: Leisure / Recreation Leisure and Hobbies: Too much social media  Watching TV  Wants to be more active  Sometimes chooses not to engage in activities because she doesn't want to go to them alone  Exercise/Diet: Exercise/Diet Do You Exercise?: Yes(but not too much  She is on her feet at work) Have You Gained or Lost A Significant Amount of Weight in the Past Six Months?: Yes-Gained Number of Pounds Gained: 10 Do You Follow a Special Diet?: No(Admits to too many sweets, doesn't eat beef or pork) Do You Have Any Trouble Sleeping?: Yes Explanation of Sleeping Difficulties: Trouble falling asleep  CCA Part Two C  Alcohol/Drug Use:  Alcohol / Drug Use History of alcohol / drug use?: No history of alcohol / drug abuse                      CCA Part Three  ASAM's:  Six Dimensions of Multidimensional Assessment  Dimension 1:  Acute Intoxication and/or Withdrawal Potential:     Dimension 2:  Biomedical Conditions and Complications:     Dimension 3:  Emotional, Behavioral, or Cognitive Conditions and Complications:     Dimension 4:  Readiness to Change:     Dimension 5:   Relapse, Continued use, or Continued Problem Potential:     Dimension 6:  Recovery/Living Environment:      Substance use Disorder (SUD)    Social Function:  Social Functioning Social Maturity: Isolates Social Judgement: Normal  Stress:  Stress Stressors: Work Coping Ability: Exhausted Patient Takes Medications The Way The Doctor Instructed?: Yes  Risk Assessment- Self-Harm Potential: Risk Assessment For Self-Harm Potential Thoughts of Self-Harm: No current thoughts Additional Comments for Self-Harm Potential: Denies history of self-harm  Risk Assessment -Dangerous to Others Potential: Risk Assessment For Dangerous to Others Potential Method: No Plan Additional Comments for Danger to Others Potential: Denies history of harm to others  DSM5 Diagnoses: Patient Active Problem List   Diagnosis Date Noted  . Attention deficit hyperactivity disorder (ADHD), predominantly inattentive type 02/27/2018  . Arthralgia 02/27/2018  . Screening for malignant neoplasm of colon 02/27/2018  . H/O: hysterectomy 02/27/2018  . History of prediabetes 02/27/2018  . History of hypothyroidism 02/27/2018  . History of depression 02/27/2018  . History of anxiety 02/27/2018  . History of ADHD 02/27/2018  . NASH (nonalcoholic steatohepatitis) 02/27/2018  . GAD (generalized anxiety disorder) 01/06/2017  . Moderate episode of recurrent major depressive disorder ([place]) 01/06/2017  . Anxiety 09/12/2015  . Environmental allergies 09/12/2015  . Essential hypertension 09/12/2015  . OSA on CPAP 09/12/2015      Recommendations for Services/Supports/Treatments: Recommendations for Services/Supports/Treatments Recommendations For Services/Supports/Treatments: Individual Therapy, Medication Management    Garnette Scheuermann

## 2018-06-15 ENCOUNTER — Encounter: Payer: Self-pay | Admitting: Osteopathic Medicine

## 2018-06-18 ENCOUNTER — Ambulatory Visit (INDEPENDENT_AMBULATORY_CARE_PROVIDER_SITE_OTHER): Payer: No Typology Code available for payment source | Admitting: Licensed Clinical Social Worker

## 2018-06-18 DIAGNOSIS — F900 Attention-deficit hyperactivity disorder, predominantly inattentive type: Secondary | ICD-10-CM | POA: Diagnosis not present

## 2018-06-18 DIAGNOSIS — F331 Major depressive disorder, recurrent, moderate: Secondary | ICD-10-CM | POA: Diagnosis not present

## 2018-06-18 DIAGNOSIS — F411 Generalized anxiety disorder: Secondary | ICD-10-CM | POA: Diagnosis not present

## 2018-06-18 NOTE — Progress Notes (Signed)
   THERAPIST PROGRESS NOTE  Session Time: 11:43am-12:30pm  Participation Level: Active  Behavioral Response: CasualAlert  Anxious and tearful  Type of Therapy: Individual Therapy  Treatment Goals addressed:   Interventions: Treatment planning   Suicidal/Homicidal: Denied both  Therapist Interventions: Collaborated with patient to develop her treatment plan after exploring her concerns further.  Introduced the concept of mindfulness and explained how there is potential for it to be helpful in addressing a number of issues.   Showed patient a workbook they might use called The Mindful Way Workbook.  Patient took a picture of it to see if she might want to purchase her own copy.     Summary:  Apologized for being late.  She overslept.  Had some trouble articulating what she would like to achieve in therapy.  Tearful as she described how overwhelmed she feels at times when her mind is scattered in different directions.    Not familiar with mindfulness.  Indicated she is open to learning more about it.  Hoping it will help her to feel more calm and focused.            Plan: Return in approximately two weeks.  Will educate further about mindfulness.  Diagnosis:  GAD                     MDD recurrent, moderate                      ADHD Inattentive type    Garnette Scheuermann, LCSW 06/18/2018

## 2018-07-09 ENCOUNTER — Ambulatory Visit (INDEPENDENT_AMBULATORY_CARE_PROVIDER_SITE_OTHER): Payer: No Typology Code available for payment source | Admitting: Licensed Clinical Social Worker

## 2018-07-09 ENCOUNTER — Ambulatory Visit (HOSPITAL_COMMUNITY): Payer: No Typology Code available for payment source | Admitting: Licensed Clinical Social Worker

## 2018-07-09 ENCOUNTER — Ambulatory Visit (HOSPITAL_COMMUNITY): Payer: Self-pay | Admitting: Licensed Clinical Social Worker

## 2018-07-09 DIAGNOSIS — F900 Attention-deficit hyperactivity disorder, predominantly inattentive type: Secondary | ICD-10-CM

## 2018-07-09 DIAGNOSIS — F411 Generalized anxiety disorder: Secondary | ICD-10-CM | POA: Diagnosis not present

## 2018-07-09 DIAGNOSIS — F331 Major depressive disorder, recurrent, moderate: Secondary | ICD-10-CM | POA: Diagnosis not present

## 2018-07-10 ENCOUNTER — Ambulatory Visit (HOSPITAL_COMMUNITY): Payer: No Typology Code available for payment source | Admitting: Psychiatry

## 2018-07-10 NOTE — Progress Notes (Signed)
   THERAPIST PROGRESS NOTE  Session Time: 2:11pm-3:04pm  Participation Level: Active  Behavioral Response: CasualAlert  Anxious and tearful  Type of Therapy: Individual Therapy  Treatment Goals addressed: Reduce anxiety and increase satisfaction with accomplishments  Interventions: Mindfulness   Suicidal/Homicidal: Denied both  Therapist Interventions: Returned to the topic of mindfulness.  Emphasized how learning to focus on the present can help you to feel more in control of your emotions.  Explained how it can be useful to practice at times when you catch yourself having unhelpful thoughts.  Had patient watch a couple videos about mindfulness including one describing the practice of meditation.  Recommended a daily practice of mindfulness for 5-10 minutes.        Summary:  Reported her anxiety remains elevated and her thoughts are scattered.  She was tearful as she talked about how exhausted she feels.  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  She wonders if it is a good fit for her.   Noted feeling as though she is not accomplishing the things that need to get done. Appeared to be skeptical about the potential to train her brain to slow down and let go of judgment.  Willing to learn more about how to apply mindfulness though.               Plan: Return in approximately two weeks.  Will explain different ways of practicing mindfulness.  Teach a focused breathing exercise. Treatment plan review is due 09/17/18  Diagnosis:  GAD                     MDD recurrent, moderate                      ADHD Inattentive type    Garnette Scheuermann, LCSW 07/10/2018

## 2018-07-17 ENCOUNTER — Encounter

## 2018-07-17 ENCOUNTER — Ambulatory Visit (INDEPENDENT_AMBULATORY_CARE_PROVIDER_SITE_OTHER): Payer: No Typology Code available for payment source | Admitting: Psychiatry

## 2018-07-17 ENCOUNTER — Encounter (HOSPITAL_COMMUNITY): Payer: Self-pay | Admitting: Psychiatry

## 2018-07-17 VITALS — BP 136/88 | HR 75 | Ht 67.0 in | Wt 228.0 lb

## 2018-07-17 DIAGNOSIS — F331 Major depressive disorder, recurrent, moderate: Secondary | ICD-10-CM | POA: Diagnosis not present

## 2018-07-17 DIAGNOSIS — F900 Attention-deficit hyperactivity disorder, predominantly inattentive type: Secondary | ICD-10-CM

## 2018-07-17 DIAGNOSIS — F411 Generalized anxiety disorder: Secondary | ICD-10-CM

## 2018-07-17 MED ORDER — ESCITALOPRAM OXALATE 10 MG PO TABS: 10.0000 mg | ORAL_TABLET | Freq: Every day | ORAL | 0 refills | Status: DC

## 2018-07-17 MED ORDER — AMPHETAMINE-DEXTROAMPHETAMINE 10 MG PO TABS: 10.0000 mg | ORAL_TABLET | Freq: Every day | ORAL | 0 refills | Status: DC

## 2018-07-17 NOTE — Progress Notes (Signed)
[facility] Outpatient Follow up visit   Patient Identification: Melanie [name] MRN:  [id number] Date of Evaluation:  07/17/2018 Referral Source: primary care Chief Complaint:    Visit Diagnosis:    ICD-10-CM   1. Moderate episode of recurrent major depressive disorder (HCC) F33.1   2. GAD (generalized anxiety disorder) F41.1   3. Attention deficit hyperactivity disorder (ADHD), predominantly inattentive type F90.0     History of Present Illness: 53 years old currently single Caucasian female referred by primary care physician for management of depression and anxiety. Works as Therapist, sports at [employer]   Patient has been on different medication in the past but noncompliant history. We started Wellbutrin last time because she felt it has helped some in the past.  She is endorsing some increased anxiety she did not continue for long  She has initially mentioned that she feels lonely because of having no kids.  As of now she brought some notes that she is distracted forgetful she cannot get things done at home doing her job although this is 13 hours she is able to function because she keeps herself busy but in general at home she gets more distracted and forgetful that upsets her makes her frustrated and that adds to her depression anxiety. She is working therapy but does not feel that it is a good fit or she does not feel that she is able to get her point across over follow-up the modality of therapy as of now  And may discuss it t in the next 1-1 session Says more dysphoric as holiday season is here and gets dysphoric but some better since holiday season is gone.    Modifying factor mom Aggravating factors; being single no kids  Duration on and off for 5 to 10 years  Severity of depression 5/10 No associated psychotic symptoms no manic symptoms currently or in the past Denies drug use   Past Psychiatric History: depression  Previous Psychotropic Medications: Yes   Substance Abuse  History in the last 12 months:  No.  Consequences of Substance Abuse: NA  Past Medical History: History reviewed. No pertinent past medical history.  Past Surgical History:  Procedure Laterality Date  . ABDOMINAL HYSTERECTOMY    . MYOMECTOMY      Family Psychiatric History: depression. Mom has depression, sister has Depression, anxiety  Family History:  Family History  Problem Relation Age of Onset  . Lung cancer Father   . Diabetes Mother   . Skin cancer Mother     Social History:   Social History   Socioeconomic History  . Marital status: Divorced    Spouse name: Not on file  . Number of children: Not on file  . Years of education: Not on file  . Highest education level: Not on file  Occupational History  . Not on file  Social Needs  . Financial resource strain: Not on file  . Food insecurity:    Worry: Not on file    Inability: Not on file  . Transportation needs:    Medical: Not on file    Non-medical: Not on file  Tobacco Use  . Smoking status: Never Smoker  . Smokeless tobacco: Never Used  Substance and Sexual Activity  . Alcohol use: Yes    Alcohol/week: 1.0 standard drinks    Types: 1 Cans of beer per week    Comment: occ  . Drug use: Never  . Sexual activity: Not Currently    Birth control/protection: Surgical  Lifestyle  . Physical activity:    Days per week: Not on file    Minutes per session: Not on file  . Stress: Not on file  Relationships  . Social connections:    Talks on phone: Not on file    Gets together: Not on file    Attends religious service: Not on file    Active member of club or organization: Not on file    Attends meetings of clubs or organizations: Not on file    Relationship status: Not on file  Other Topics Concern  . Not on file  Social History Narrative  . Not on file      Allergies:   Allergies  Allergen Reactions  . Clarithromycin Other (See Comments) and Rash    Other Reaction: Fever  . Morphine Other  (See Comments)    Other Reaction: mental status alerted  . Sulfa Antibiotics Hives    Metabolic Disorder Labs: Lab Results  Component Value Date   HGBA1C 6.2 (H) 02/27/2018   MPG 131 02/27/2018   No results found for: PROLACTIN Lab Results  Component Value Date   CHOL 283 (H) 02/27/2018   TRIG 281 (H) 02/27/2018   HDL 54 02/27/2018   CHOLHDL 5.2 (H) 02/27/2018   LDLCALC 179 (H) 02/27/2018   Lab Results  Component Value Date   TSH 2.81 02/27/2018    Therapeutic Level Labs: No results found for: LITHIUM No results found for: CBMZ No results found for: VALPROATE  Current Medications: Current Outpatient Medications  Medication Sig Dispense Refill  . 5-Hydroxytryptophan (5-HTP MAXIMUM STRENGTH PO) Take by mouth.    Marland Kitchen aspirin EC 81 MG tablet Take 81 mg by mouth daily.    Marland Kitchen buPROPion (WELLBUTRIN SR) 100 MG 12 hr tablet Take 1 tablet (100 mg total) by mouth 2 (two) times daily. 60 tablet 1  . cholecalciferol (VITAMIN D3) 25 MCG (1000 UT) tablet Take 2,000 Units by mouth daily.    . Estradiol 10 MCG TABS vaginal tablet Insert one tablet vaginally nightly for 2 weeks then twice a week. 20 tablet 6  . hydrochlorothiazide (HYDRODIURIL) 25 MG tablet Take 1 tablet (25 mg total) by mouth daily. 90 tablet 1  . Omega-3 Fatty Acids (FISH OIL) 1000 MG CAPS Take 1,400 mg by mouth.    . vitamin B-12 (CYANOCOBALAMIN) 500 MCG tablet Take 2,500 mcg by mouth daily.    Marland Kitchen amoxicillin-clavulanate (AUGMENTIN) 250-125 MG tablet Take 1 tablet by mouth 3 (three) times daily.    Marland Kitchen amphetamine-dextroamphetamine (ADDERALL) 10 MG tablet Take 1 tablet (10 mg total) by mouth daily. 30 tablet 0  . benzonatate (TESSALON PERLES) 100 MG capsule Take 1 capsule (100 mg total) by mouth 3 (three) times daily as needed for cough. (Patient not taking: Reported on 04/02/2018) 20 capsule 0  . clotrimazole-betamethasone (LOTRISONE) cream Apply 1 application topically 2 (two) times daily. (Patient not taking: Reported on  04/02/2018) 45 g 0  . escitalopram (LEXAPRO) 10 MG tablet Take 1 tablet (10 mg total) by mouth daily. 30 tablet 0  . HYDROcodone-homatropine (HYCODAN) 5-1.5 MG/5ML syrup Take 5 mLs by mouth 2 (two) times daily. (Patient not taking: Reported on 04/02/2018) 50 mL 0  . predniSONE (DELTASONE) 20 MG tablet One tablet twice a day for 5 days. (Patient not taking: Reported on 04/02/2018) 10 tablet 0   No current facility-administered medications for this visit.       Psychiatric Specialty Exam: Review of Systems  Constitutional: Positive for malaise/fatigue.  Cardiovascular: Negative for chest pain.  Skin: Negative for rash.  Neurological: Negative for tremors.  Psychiatric/Behavioral: Positive for depression.    Blood pressure 136/88, pulse 75, height 5' 7"  (1.702 m), weight 228 lb (103.4 kg), SpO2 95 %.Body mass index is 35.71 kg/m.  General Appearance: Casual  Eye Contact:  Fair  Speech:  Normal Rate  Volume:  Decreased  Mood:  subdued  Affect:  Congruent  Thought Process:  Goal Directed  Orientation:  Full (Time, Place, and Person)  Thought Content:  Logical  Suicidal Thoughts:  No  Homicidal Thoughts:  No  Memory:  Immediate;   Fair Recent;   Fair  Judgement:  Fair  Insight:  Fair  Psychomotor Activity:  Decreased  Concentration:  Concentration: Fair and Attention Span: Fair  Recall:  [employer] of Knowledge:Fair  Language: Good  Akathisia:  No  Handed:  Right  AIMS (if indicated):  not done  Assets:  Desire for Improvement  ADL's:  Intact  Cognition: WNL  Sleep:  Fair   Screenings: GAD-7     Office Visit from 06/04/2018 in [place]  Total GAD-7 Score  13    PHQ2-9     Office Visit from 06/04/2018 in [place] Office Visit from 02/27/2018 in [place]  PHQ-2 Total Score  4  1  PHQ-9 Total Score  19  8      Assessment and Plan: as follows  MDD  , moderate recurrent: has stopped wellubtrin. Will start lexapro. Says have used 35m or higher dose. willl start [id number] fluctuates, continue therapy discuss more of different modalities that would help Add lexapro as above AdHD: functioning at job fair but distracted at home and gets frustrated. Will start adderall says 1084mr higher has helepd before  reviewed concerns and side effects including it may incrase anxiety  Cut down coffee intake  Fu 4-6 w, she wants to come in 51m 17montinue therapy Call in earlier for concerns  NadeMerian Capron 1/14/20204:10 PM

## 2018-07-24 ENCOUNTER — Ambulatory Visit (HOSPITAL_COMMUNITY): Payer: No Typology Code available for payment source | Admitting: Licensed Clinical Social Worker

## 2018-08-06 ENCOUNTER — Ambulatory Visit (INDEPENDENT_AMBULATORY_CARE_PROVIDER_SITE_OTHER): Payer: No Typology Code available for payment source | Admitting: Licensed Clinical Social Worker

## 2018-08-06 DIAGNOSIS — F411 Generalized anxiety disorder: Secondary | ICD-10-CM

## 2018-08-06 DIAGNOSIS — F331 Major depressive disorder, recurrent, moderate: Secondary | ICD-10-CM | POA: Diagnosis not present

## 2018-08-06 DIAGNOSIS — F900 Attention-deficit hyperactivity disorder, predominantly inattentive type: Secondary | ICD-10-CM

## 2018-08-06 NOTE — Progress Notes (Signed)
THERAPIST PROGRESS NOTE  Session Time: 2:03 PM to 3 PM  Participation Level: Active  Behavioral Response: CasualAlertDepressed and tearful throughout session  Type of Therapy: Individual Therapy  Treatment Goals addressed:  reduce anxiety and increased satisfaction with accomplishments  Interventions: CBT, Solution Focused, Strength-based, Supportive and Other: education on panic and anxiety, coping  Summary: Melanie [name] is a 53 y.o. female who presents the session for first session with this therapist.  Provided background as well as update to symptoms.  Shares she is struggling a lot.  Lot of anxiety explains she does not feel she can get things done even when she tries, cannot keep up with stuff, describes that her "mind keeps going", that she has problems with racing thoughts.  She is frustrated with herself, feels she is caught in a cycle of not doing anything right and cannot break it.  Shares then she gets anxious, depressed, wakes up in the morning with heart pounding relates anxiety is nothing new have had the symptoms but bad right now.  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  Pointed out that she relate to being exhausted when comes home from work.  Shares "I do not have the ability to focus" shares that kitchen in office "look like a bomb went off".  Shares overwhelm with things and when tries to do something can accomplish anything, says has wanted to be by herself, recognizes connection with depression.  (Patient was tearful throughout session) shares she is unhappy and dissatisfied with her life and looked at relationship between her situation and symptoms.  Patient related moved to this area and regretted the decision, really missed [facility], that has evolved into current difficulty in functioning.  Shares she feels as if  symptoms are "monumental right now" and wants to escape.  Shares that she feels "existing and not living".  Describes being overwhelmed easily.  Therapist pointed out how critical patient was to herself and challenged this perspective with perspective difficulty in dealing and functioning with distressing emotions.  Discussed involving herself in a simple task instead of trying to take on too much which will be overwhelming.  Shares mind never shuts down.  Shares never has had depression where she has not been able to function, now tries to do stuff and cannot get it together, shares she does not feel like she is "effectively adulting" she looked up ADHD symptoms and fits what is happening to her right now, that she will stop one thing and start another, get frustrated, feels stupid for not completing tasks.  Elaborated on anxiety that can feel like hand around throat so much tension in body and neck.  Felt that "I care too much about things".  Relates implementation is her issue and the problem when she is motivated she is overwhelmed, is from task to task, it is all or nothing, stuck with time management, being consistent, very forgetful, indecisive.  Shares feels "defeated and ashamed".  Started worksheet on anxiety (see below) Therapist assessed patient current functioning per report and gathered information related to history of significant events and update to current symptoms as this is the first time therapist has met with patient.  Identified life situation is contributing to symptoms and to look at making some changes that will help with symptoms.  Assess patient's symptoms  related to problems significant problems with ADHD symptoms.  Worked on reframing and education and discussed how medications argues with severe symptoms to help patient stabilized to be able to utilize coping strategies so they are part of effective treatment plan especially with distressing symptoms.  Provided education about anxiety  that is fight or flight being activated and often a person's misperception of dangerousness.  The symptoms related to adrenaline response and with this understanding of causes will help with coping.  Discussed the importance of relaxing once body as in order to decrease anxiety 1 has to decrease once physiological arousal, discussed neurobiology of the brain and how deep breathing and relaxation helps deactivate overactive amygdala.  Assessed patient is being very critical of self and place apart and escalation of symptoms.  Developed initial plan to help help begin to work on symptoms including deep breathing daily, engaging in relaxation exercises a few times a week, countering negative self talk, taking meds to help decrease symptoms, starting with something small to accomplish instead of overwhelming herself with something larger.  Discussed that strategies that help with anxiety will help with focus.  Started work sheet on anxiety, to help explain cause of symptoms and coping strategies and patient to finish at home.  Provided strength based and supportive interventions as well as working on relationship Suicidal/Homicidal: No  Plan: Return again in 1-2 weeks.2.  Patient will start relaxation exercises to include deep breathing, and relaxation activities during the week.  She will work on setting small goals for her to accomplish so she does not feel overwhelmed.  She will stay on meds that will help her with functioning to implement effective coping strategies.  She will count her negative self talk and review worksheet on anxiety.3.  Therapist will focus on different areas to help patient with functioning including strategies for ADHD symptoms, changing stressors in environment, coping for anxiety and depression  Diagnosis: Axis I:  GAD                     MDD recurrent, moderate                      ADHD Inattentive type    Axis II: No diagnosis    Mary Bowman, LCSW 08/06/2018  

## 2018-08-07 ENCOUNTER — Encounter: Payer: Self-pay | Admitting: Family Medicine

## 2018-08-07 ENCOUNTER — Ambulatory Visit (INDEPENDENT_AMBULATORY_CARE_PROVIDER_SITE_OTHER): Payer: No Typology Code available for payment source | Admitting: Family Medicine

## 2018-08-07 VITALS — BP 118/77 | HR 69 | Temp 98.3°F | Wt 225.0 lb

## 2018-08-07 DIAGNOSIS — J0110 Acute frontal sinusitis, unspecified: Secondary | ICD-10-CM | POA: Diagnosis not present

## 2018-08-07 MED ORDER — PREDNISONE 10 MG PO TABS: 30.0000 mg | ORAL_TABLET | Freq: Every day | ORAL | 0 refills | Status: DC

## 2018-08-07 MED ORDER — CEFDINIR 300 MG PO CAPS: 300.0000 mg | ORAL_CAPSULE | Freq: Two times a day (BID) | ORAL | 0 refills | Status: DC

## 2018-08-07 NOTE — Progress Notes (Signed)
Melanie [name] is a 53 y.o. female who presents to [facility] today for headache.  Melanie [name] has felt sick off and on for the last month.  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  She developed left frontal and maxillary facial pain and pressure associated with some tooth pain.  She developed nasal discharge and congestion.  She developed a worsening headache yesterday.  She notes the headache was quite severe at times.  She did have some photophobia along with a headache.  She notes that she has headaches occasionally but has never been diagnosed with migraines.  She took some over-the-counter medication such as Sudafed which helped a bit.  She is feeling a bit poorly now but notes the headache is present but significantly improved.  No weakness or numbness or loss of function.   ROS as above:  Exam:  BP 118/77   Pulse 69   Temp 98.3 F (36.8 C) (Oral)   Wt 225 lb (102.1 kg)   BMI 35.24 kg/m  Wt Readings from Last 5 Encounters:  08/07/18 225 lb (102.1 kg)  04/02/18 225 lb (102.1 kg)  03/28/18 226 lb 8 oz (102.7 kg)  02/27/18 226 lb 8 oz (102.7 kg)    Gen: Well NAD HEENT: EOMI,  MMM mildly tender palpation left frontal maxillary sinuses.  Normal tympanic membranes.  Normal posterior pharynx.  No significant cervical lymphadenopathy Lungs: Normal work of breathing. CTABL Heart: RRR no MRG Abd: NABS, Soft. Nondistended, Nontender Exts: Brisk capillary refill, warm and well perfused.  Neuro: Alert and oriented normal coordination balance and gait.  Lab and Radiology Results No results found for this or any previous visit (from the past 72 hour(s)). No results found.    Assessment and Plan: 53 y.o. female with left frontal maxillary headache.  Likely sinusitis possibly migraine type.  Plan for treatment with  Omnicef and prednisone.  Reasonable to use Sudafed as needed as well as Tylenol.  Recheck if not improving.  Proceed with further work-up as needed.  PDMP reviewed during this encounter. No orders of the defined types were placed in this encounter.  Meds ordered this encounter  Medications  . cefdinir (OMNICEF) 300 MG capsule    Sig: Take 1 capsule (300 mg total) by mouth 2 (two) times daily.    Dispense:  14 capsule    Refill:  0  . predniSONE (DELTASONE) 10 MG tablet    Sig: Take 3 tablets (30 mg total) by mouth daily with breakfast.    Dispense:  15 tablet    Refill:  0     Historical information moved to improve visibility of documentation.  No past medical history on file. Past Surgical History:  Procedure Laterality Date  . ABDOMINAL HYSTERECTOMY    . MYOMECTOMY     Social History   Tobacco Use  . Smoking status: Never Smoker  . Smokeless tobacco: Never Used  Substance Use Topics  . Alcohol use: Yes    Alcohol/week: 1.0 standard drinks    Types: 1 Cans of beer per week    Comment: occ   family history includes Diabetes in her mother; Lung cancer in her father; Skin cancer in her mother.  Medications: Current Outpatient Medications  Medication Sig Dispense Refill  . amphetamine-dextroamphetamine (ADDERALL) 10 MG tablet Take 1 tablet (10 mg total) by mouth daily. 30 tablet 0  . aspirin EC  81 MG tablet Take 81 mg by mouth daily.    . cholecalciferol (VITAMIN D3) 25 MCG (1000 UT) tablet Take 2,000 Units by mouth daily.    Marland Kitchen escitalopram (LEXAPRO) 10 MG tablet Take 1 tablet (10 mg total) by mouth daily. 30 tablet 0  . hydrochlorothiazide (HYDRODIURIL) 25 MG tablet Take 1 tablet (25 mg total) by mouth daily. 90 tablet 1  . Omega-3 Fatty Acids (FISH OIL) 1000 MG CAPS Take 1,400 mg by mouth.    . vitamin B-12 (CYANOCOBALAMIN) 500 MCG tablet Take 2,500 mcg by mouth daily.    . cefdinir (OMNICEF) 300 MG capsule Take 1 capsule (300 mg total) by mouth 2 (two) times daily. 14  capsule 0  . predniSONE (DELTASONE) 10 MG tablet Take 3 tablets (30 mg total) by mouth daily with breakfast. 15 tablet 0   No current facility-administered medications for this visit.    Allergies  Allergen Reactions  . Clarithromycin Other (See Comments) and Rash    Other Reaction: Fever  . Morphine Other (See Comments)    Other Reaction: mental status alerted  . Sulfa Antibiotics Hives     Discussed warning signs or symptoms. Please see discharge instructions. Patient expresses understanding.

## 2018-08-07 NOTE — Patient Instructions (Signed)
Thank you for coming in today. Call or go to the emergency room if you get worse, have trouble breathing, have chest pains, or palpitations.  I think this is probably a sinus headache.  Take the prednisone and omnicef.  OK to take with tylenol.  Recheck if not improving.   Go to the emergency room if your headache becomes excruciating or you have weakness or numbness or uncontrolled vomiting.   Ok to continue sudafed.   Ok to use humidifier.   Ok to use netti pot.    Sinusitis, Adult Sinusitis is inflammation of your sinuses. Sinuses are hollow spaces in the bones around your face. Your sinuses are located:  Around your eyes.  In the middle of your forehead.  Behind your nose.  In your cheekbones. Mucus normally drains out of your sinuses. When your nasal tissues become inflamed or swollen, mucus can become trapped or blocked. This allows bacteria, viruses, and fungi to grow, which leads to infection. Most infections of the sinuses are caused by a virus. Sinusitis can develop quickly. It can last for up to 4 weeks (acute) or for more than 12 weeks (chronic). Sinusitis often develops after a cold. What are the causes? This condition is caused by anything that creates swelling in the sinuses or stops mucus from draining. This includes:  Allergies.  Asthma.  Infection from bacteria or viruses.  Deformities or blockages in your nose or sinuses.  Abnormal growths in the nose (nasal polyps).  Pollutants, such as chemicals or irritants in the air.  Infection from fungi (rare). What increases the risk? You are more likely to develop this condition if you:  Have a weak body defense system (immune system).  Do a lot of swimming or diving.  Overuse nasal sprays.  Smoke. What are the signs or symptoms? The main symptoms of this condition are pain and a feeling of pressure around the affected sinuses. Other symptoms include:  Stuffy nose or congestion.  Thick drainage  from your nose.  Swelling and warmth over the affected sinuses.  Headache.  Upper toothache.  A cough that may get worse at night.  Extra mucus that collects in the throat or the back of the nose (postnasal drip).  Decreased sense of smell and taste.  Fatigue.  A fever.  Sore throat.  Bad breath. How is this diagnosed? This condition is diagnosed based on:  Your symptoms.  Your medical history.  A physical exam.  Tests to find out if your condition is acute or chronic. This may include: ? Checking your nose for nasal polyps. ? Viewing your sinuses using a device that has a light (endoscope). ? Testing for allergies or bacteria. ? Imaging tests, such as an MRI or CT scan. In rare cases, a bone biopsy may be done to rule out more serious types of fungal sinus disease. How is this treated? Treatment for sinusitis depends on the cause and whether your condition is chronic or acute.  If caused by a virus, your symptoms should go away on their own within 10 days. You may be given medicines to relieve symptoms. They include: ? Medicines that shrink swollen nasal passages (topical intranasal decongestants). ? Medicines that treat allergies (antihistamines). ? A spray that eases inflammation of the nostrils (topical intranasal corticosteroids). ? Rinses that help get rid of thick mucus in your nose (nasal saline washes).  If caused by bacteria, your health care provider may recommend waiting to see if your symptoms improve. Most bacterial infections  will get better without antibiotic medicine. You may be given antibiotics if you have: ? A severe infection. ? A weak immune system.  If caused by narrow nasal passages or nasal polyps, you may need to have surgery. Follow these instructions at home: Medicines  Take, use, or apply over-the-counter and prescription medicines only as told by your health care provider. These may include nasal sprays.  If you were prescribed an  antibiotic medicine, take it as told by your health care provider. Do not stop taking the antibiotic even if you start to feel better. Hydrate and humidify   Drink enough fluid to keep your urine pale yellow. Staying hydrated will help to thin your mucus.  Use a cool mist humidifier to keep the humidity level in your home above 50%.  Inhale steam for 10-15 minutes, 3-4 times a day, or as told by your health care provider. You can do this in the bathroom while a hot shower is running.  Limit your exposure to cool or dry air. Rest  Rest as much as possible.  Sleep with your head raised (elevated).  Make sure you get enough sleep each night. General instructions   Apply a warm, moist washcloth to your face 3-4 times a day or as told by your health care provider. This will help with discomfort.  Wash your hands often with soap and water to reduce your exposure to germs. If soap and water are not available, use hand sanitizer.  Do not smoke. Avoid being around people who are smoking (secondhand smoke).  Keep all follow-up visits as told by your health care provider. This is important. Contact a health care provider if:  You have a fever.  Your symptoms get worse.  Your symptoms do not improve within 10 days. Get help right away if:  You have a severe headache.  You have persistent vomiting.  You have severe pain or swelling around your face or eyes.  You have vision problems.  You develop confusion.  Your neck is stiff.  You have trouble breathing. Summary  Sinusitis is soreness and inflammation of your sinuses. Sinuses are hollow spaces in the bones around your face.  This condition is caused by nasal tissues that become inflamed or swollen. The swelling traps or blocks the flow of mucus. This allows bacteria, viruses, and fungi to grow, which leads to infection.  If you were prescribed an antibiotic medicine, take it as told by your health care provider. Do not  stop taking the antibiotic even if you start to feel better.  Keep all follow-up visits as told by your health care provider. This is important. This information is not intended to replace advice given to you by your health care provider. Make sure you discuss any questions you have with your health care provider. Document Released: 06/20/2005 Document Revised: 11/20/2017 Document Reviewed: 11/20/2017 Elsevier Interactive Patient Education  2019 [employer].

## 2018-08-13 ENCOUNTER — Ambulatory Visit (INDEPENDENT_AMBULATORY_CARE_PROVIDER_SITE_OTHER): Payer: No Typology Code available for payment source | Admitting: Psychiatry

## 2018-08-13 ENCOUNTER — Encounter (HOSPITAL_COMMUNITY): Payer: Self-pay | Admitting: Psychiatry

## 2018-08-13 ENCOUNTER — Ambulatory Visit (HOSPITAL_COMMUNITY): Payer: No Typology Code available for payment source | Admitting: Psychiatry

## 2018-08-13 VITALS — BP 124/82 | HR 69 | Ht 67.0 in | Wt 226.0 lb

## 2018-08-13 DIAGNOSIS — F900 Attention-deficit hyperactivity disorder, predominantly inattentive type: Secondary | ICD-10-CM

## 2018-08-13 DIAGNOSIS — F411 Generalized anxiety disorder: Secondary | ICD-10-CM | POA: Diagnosis not present

## 2018-08-13 DIAGNOSIS — F331 Major depressive disorder, recurrent, moderate: Secondary | ICD-10-CM | POA: Diagnosis not present

## 2018-08-13 MED ORDER — ESCITALOPRAM OXALATE 10 MG PO TABS: 10.0000 mg | ORAL_TABLET | Freq: Every day | ORAL | 0 refills | Status: DC

## 2018-08-13 NOTE — Progress Notes (Signed)
[facility] Follow up visit   Patient Identification: Melanie [name] MRN:  [id number] Date of Evaluation:  08/13/2018 Referral Source: primary care Chief Complaint:    Visit Diagnosis:    ICD-10-CM   1. Moderate episode of recurrent major depressive disorder (HCC) F33.1   2. GAD (generalized anxiety disorder) F41.1   3. Attention deficit hyperactivity disorder (ADHD), predominantly inattentive type F90.0     History of Present Illness: 53 years old currently single Caucasian female referred by primary care physician for management of depression and anxiety. Works as Therapist, sports at [employer]   Patient has been on different medication in the past but noncompliant history.  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  She uses CPAP machine  Has been more dysphoric during the holiday season but somewhat better now.   Modifying factor mom Aggravating factors; being single no kids  Duration on and off for 5 to 10 years  Severity of depression 6/10 No associated psychotic symptoms no manic symptoms currently or in the past Denies drug use   Past Psychiatric History: depression  Previous Psychotropic Medications: Yes   Substance Abuse History in the last 12 months:  No.  Consequences of Substance Abuse: NA  Past Medical History: History reviewed. No pertinent past medical history.  Past Surgical History:  Procedure Laterality Date  . ABDOMINAL HYSTERECTOMY    . MYOMECTOMY      Family Psychiatric History: depression. Mom has depression, sister has Depression, anxiety  Family History:  Family History   Problem Relation Age of Onset  . Lung cancer Father   . Diabetes Mother   . Skin cancer Mother     Social History:   Social History   Socioeconomic History  . Marital status: Divorced    Spouse name: Not on file  . Number of children: Not on file  . Years of education: Not on file  . Highest education level: Not on file  Occupational History  . Not on file  Social Needs  . Financial resource strain: Not on file  . Food insecurity:    Worry: Not on file    Inability: Not on file  . Transportation needs:    Medical: Not on file    Non-medical: Not on file  Tobacco Use  . Smoking status: Never Smoker  . Smokeless tobacco: Never Used  Substance and Sexual Activity  . Alcohol use: Yes    Alcohol/week: 1.0 standard drinks    Types: 1 Cans of beer per week    Comment: occ  . Drug use: Never  . Sexual activity: Not Currently    Birth control/protection: Surgical  Lifestyle  . Physical activity:    Days per week: Not on file    Minutes per session: Not on file  . Stress: Not on file  Relationships  . Social connections:    Talks on phone: Not on file    Gets together: Not on file    Attends religious service: Not on file    Active member of club or organization: Not on file    Attends  meetings of clubs or organizations: Not on file    Relationship status: Not on file  Other Topics Concern  . Not on file  Social History Narrative  . Not on file      Allergies:   Allergies  Allergen Reactions  . Clarithromycin Other (See Comments) and Rash    Other Reaction: Fever  . Morphine Other (See Comments)    Other Reaction: mental status alerted  . Sulfa Antibiotics Hives    Metabolic Disorder Labs: Lab Results  Component Value Date   HGBA1C 6.2 (H) 02/27/2018   MPG 131 02/27/2018   No results found for: PROLACTIN Lab Results  Component Value Date   CHOL 283 (H) 02/27/2018   TRIG 281 (H) 02/27/2018   HDL 54 02/27/2018   CHOLHDL 5.2 (H) 02/27/2018    LDLCALC 179 (H) 02/27/2018   Lab Results  Component Value Date   TSH 2.81 02/27/2018    Therapeutic Level Labs: No results found for: LITHIUM No results found for: CBMZ No results found for: VALPROATE  Current Medications: Current Outpatient Medications  Medication Sig Dispense Refill  . amphetamine-dextroamphetamine (ADDERALL) 10 MG tablet Take 1 tablet (10 mg total) by mouth daily. 30 tablet 0  . aspirin EC 81 MG tablet Take 81 mg by mouth daily.    . cholecalciferol (VITAMIN D3) 25 MCG (1000 UT) tablet Take 2,000 Units by mouth daily.    Marland Kitchen escitalopram (LEXAPRO) 10 MG tablet Take 1 tablet (10 mg total) by mouth daily. 30 tablet 0  . hydrochlorothiazide (HYDRODIURIL) 25 MG tablet Take 1 tablet (25 mg total) by mouth daily. 90 tablet 1  . Omega-3 Fatty Acids (FISH OIL) 1000 MG CAPS Take 1,400 mg by mouth.    . vitamin B-12 (CYANOCOBALAMIN) 500 MCG tablet Take 2,500 mcg by mouth daily.    . cefdinir (OMNICEF) 300 MG capsule Take 1 capsule (300 mg total) by mouth 2 (two) times daily. (Patient not taking: Reported on 08/13/2018) 14 capsule 0  . predniSONE (DELTASONE) 10 MG tablet Take 3 tablets (30 mg total) by mouth daily with breakfast. 15 tablet 0   No current facility-administered medications for this visit.       Psychiatric Specialty Exam: Review of Systems  Constitutional: Positive for malaise/fatigue.  Cardiovascular: Negative for chest pain.  Skin: Negative for itching.  Neurological: Negative for tremors.  Psychiatric/Behavioral: Positive for depression.    Blood pressure 124/82, pulse 69, height 5' 7"  (1.702 m), weight 226 lb (102.5 kg), SpO2 98 %.Body mass index is 35.4 kg/m.  General Appearance: Casual  Eye Contact:  Fair  Speech:  Normal Rate  Volume:  Decreased  Mood: somewhat subdued  Affect:  Congruent  Thought Process:  Goal Directed  Orientation:  Full (Time, Place, and Person)  Thought Content:  Logical  Suicidal Thoughts:  No  Homicidal Thoughts:   No  Memory:  Immediate;   Fair Recent;   Fair  Judgement:  Fair  Insight:  Fair  Psychomotor Activity:  Decreased  Concentration:  Concentration: Fair and Attention Span: Fair  Recall:  [employer] of Knowledge:Fair  Language: Good  Akathisia:  No  Handed:  Right  AIMS (if indicated):  not done  Assets:  Desire for Improvement  ADL's:  Intact  Cognition: WNL  Sleep:  Fair   Screenings: GAD-7     Office Visit from 06/04/2018 in [facility]  Total GAD-7 Score  13    PHQ2-9  Office Visit from 06/04/2018 in [facility] Office Visit from 02/27/2018 in [facility]  PHQ-2 Total Score  4  1  PHQ-9 Total Score  19  8      Assessment and Plan: as follows  MDD , moderate recurrent:  Some better, still subdued, increase lexapro to 8m as suiggested last visit GTGA:IDKSMMOCAR increase lexapro to 166mAdHD: functioning at job gets distracted at home, has not started adderall, can begin now or in few days   reviewed concerns and side effects including it may incrase anxiety  Cut down coffee intake  Fu 4-6 w, she wants to come in 86m50mContinue therapy Call in earlier for concerns  NadMerian CapronD 2/10/20204:11 PM

## 2018-08-20 ENCOUNTER — Ambulatory Visit (HOSPITAL_COMMUNITY): Payer: No Typology Code available for payment source | Admitting: Licensed Clinical Social Worker

## 2018-08-20 ENCOUNTER — Ambulatory Visit (INDEPENDENT_AMBULATORY_CARE_PROVIDER_SITE_OTHER): Payer: No Typology Code available for payment source | Admitting: Licensed Clinical Social Worker

## 2018-08-20 DIAGNOSIS — F331 Major depressive disorder, recurrent, moderate: Secondary | ICD-10-CM

## 2018-08-20 DIAGNOSIS — F900 Attention-deficit hyperactivity disorder, predominantly inattentive type: Secondary | ICD-10-CM | POA: Diagnosis not present

## 2018-08-20 DIAGNOSIS — F411 Generalized anxiety disorder: Secondary | ICD-10-CM

## 2018-08-20 NOTE — Progress Notes (Signed)
THERAPIST PROGRESS NOTE  Session Time: 3:16 PM  To 4:06 PM   Participation Level: Active  Behavioral Response: CasualAlertappropriate  Type of Therapy: Individual Therapy  Treatment Goals addressed:  reduce anxiety and increased satisfaction with accomplishments  Interventions: CBT, Solution Focused, Strength-based, Supportive and Reframing  Summary: Melanie [name] is a 53 y.o. female who presents with taking therapist's advice and starting Lexapro. Relates that it has helped and not waking up with heart out of chest, helps at times but still have feelings of being overwhelmed, how she is going to be able to catch up or get started. overwhelming catch up or trouble getting started. Trying to take therapist's advice to be nicer to self.related recent episode of breaking her house and able to say "oh well" and moving on.  Therapist pointed out effective coping strategy in general of not holding onto things and letting go that helps 1 move on and helps with functioning.  Patient says 1 root for anxiety is perfectionist thinking.  This is why she hesitates in starting something because of fear she will mess up, leads to her putting a lot of pressure on herself.  Therapist challenged patient on perfectionist thinking relating there is no such thing is perfect, though this mindset sets her up for roadblocks in her functioning.  Related that lots of times achievements are based on rejections, feels the mistakes that lead to growth and achievement.  Reviewed medication Adderall that helps her with focus but also shares that when she comes down from medication its a bad transitional period of feeling irritable.  Thinking of trying especially at home because it does help her focus.  Discussed as well issues with sleep that she has had for years, shares that one strategy that works is to wake up early and take Adderall and go back to bed, when she wakes up in the morning she does a lot better with  functioning.  Patient shares she feels that she addresses anxiety issues that will be a direct correlation with improvement in sleep, and will also help her work on other things..  Reviewed worksheets of adults with ADHD (see below) patient shared her experiences of worrying and ruminating over things they are not that bad and then the worrying makes it worse, then has trouble getting starting with things, shares some fear of things, indecisiveness and what if.  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  Shared mindset that she tells her self she does not deserve to do certain things because she has not accomplished enough around the house.  Worked on for reframing this self talk recognize it is important part of functioning, important part of treatment plan.  Patient has done a lot of reading on ADHD for adults and it has been helpful, but still frustrated with it.  Reviewed session and patient relates helpful to find forgiveness for herself, finding strategies to deal with ADHD, recognizing she is not perfect, therapist relates understanding underlying reason for issues helps better assess and be less hard on oneself.   Therapist to assess patient current functioning per report and noted some progress with start of medications.  Review possible helpfulness of starting Adderall, help with focus, help with sleeping problems.  Identified perfectionist thinking as source of issues with rumination, feeds into critical voice.  Identified patient working on being less critical of self as helpful for coping as well as not holding onto things  but letting them go.  Discussed how this is helpful in many aspects of life.  Reviewed worksheets on ADHD ng "ADHD and obsessive thoughts: too clingy, Insecure" From worksheet reviewed strategies that may help with obsessing and ruminating thoughts.  Discussed journaling as a way to put thoughts down on paper, another place  they can stick besides one's prain, also the writing process puts one in touch with subconscious beliefs that may be the root of your concerns, it can be helpful in dealing with past issues that may relate to current symptoms.  Discussed focusing on something outside your mind, something she is naturally drawn to.  Discussed distraction helps a person to calm down and then when one returns to the situation one is an rational mind and better able to cope.(distress tolerance skills) Discussed things like getting out in nature also help with increasing positive mood, and identifying it is important part of treatment plan help to engage in activity, additionally exercises helps with brain chemistry and improvement in mood.  Started worksheet "panic buttons: How to stop anxiety and its triggers and of "and provided education that when we react to something emotionally we can quickly appraise the situation but in that appraisal there can be inaccuracies so helpful to slow reaction down and challenge inaccurate appraisals.  During session therapist help in reframing patient's negative self talk.  Provided strength based and supportive interventions.   Suicidal/Homicidal: No  Plan: Return again in 2 weeks.2.  Therapist continue to work with patient on strategies to manage ADHD, decrease anxiety and depression  Diagnosis: Axis I:  GAD                     MDD recurrent, moderate                      ADHD Inattentive type    Axis II: No diagnosis    Cordella Register, LCSW 08/20/2018

## 2018-08-27 ENCOUNTER — Ambulatory Visit (INDEPENDENT_AMBULATORY_CARE_PROVIDER_SITE_OTHER): Payer: No Typology Code available for payment source | Admitting: Licensed Clinical Social Worker

## 2018-08-27 DIAGNOSIS — F411 Generalized anxiety disorder: Secondary | ICD-10-CM | POA: Diagnosis not present

## 2018-08-27 DIAGNOSIS — F331 Major depressive disorder, recurrent, moderate: Secondary | ICD-10-CM

## 2018-08-27 DIAGNOSIS — F900 Attention-deficit hyperactivity disorder, predominantly inattentive type: Secondary | ICD-10-CM

## 2018-08-27 NOTE — Progress Notes (Signed)
THERAPIST PROGRESS NOTE  Session Time:  1:03 PM to 1:55 PM  Participation Level: Active  Behavioral Response: CasualAlertappropriate  Type of Therapy: Individual Therapy  Treatment Goals addressed:  reduce anxiety and increased satisfaction with accomplishments  Interventions: CBT, Solution Focused, Strength-based, Supportive and Other: coping  Summary: Melanie [name] is a 53 y.o. female who presents with most of the time feeling better. Shared feeling that days when she is working doesn't get anything done, but it is only three days.  Discussed acceptance and reality that on days that she is working she will be tired so she does not have to fight against it helps with coping.  There she feels she is headed in the right direction.  Reviewed that doing a journal will be therapeutic for her, it will help her in remembering things.  This pointed out with remembering things that she will be better able to identify situations that create anxiety and stress for her though it will serve as a valuable tool that will help her and developing coping strategies.  Patient shares better at laughing and herself and therapist pointed this out as any good strength, very helpful for navigating stressors in life.  Shared sleep problems, goes to bed and has combination of not getting enough rest or not good quality of sleep.  Identified ways she is trying to address it on her own such as oils in her diffuser, relaxation apps.  Tears sometimes sitting up until she is exhausted so she can sleep.  Did take Adderall in the morning on Saturday, part of helping to slow her brain down, not sure if getting to sleep early that night had to do with medication but is planning to restart and take it slowly plan is to get on it consistently again.  There is Lexapro she thinks is helping with increased dosage.  Feels she needs Adderall more work but therapist also pointed out that it will help her slow down that will help  with anxiety and better able to approach tasks.  Shares better and not crying all the time tenly Adderall, need it more at work, not crying all the time. Reviewed Worksheets on Adult ADHD and sheet on rumination.  Highlighted coping strategies that would be helpful, discussed changing the negative self critic and like replacing it with "I am a work in progress".  Discussed getting out of her head more would be helpful for her she recognizes herself that she is caught up in her thoughts.  Had ways she can do this including exercise and journaling, annoying because she has "a lot of things rolling in my head", discussed making goals and putting on paper would be helpful.  Discussed identifying and labeling negative self talk and ruminating as a start to being able to manage it.  Reviewed session and patient relates she is working on sleep, recognizes suppressed anger that needs to be worked on, the journal is helpful in another capacity as a brain do not of things in her head, a way to get out of her head is through cardiovascular exercises.  Therapist assessed patient current functioning per report and noted some progress with symptoms.  Work with patient on more helpful insights such as recognizing on nights that she work and does not get anything done not to be so critical and demanding, having more awareness that this happens when she is working long shifts that she is physically and emotionally drained (giving herself a break).  Discussed journaling as  something patient will start as it will help her remember situations better, help recognize patterns, will be able to better address coping when we can address specific situations, also is a way to experience her emotions by expressing them and releasing them.  Reviewed worksheet on obsessional thinking and pointed out ways to address this includes recognition that often individuals with this thinking around up intense, they appear to be almost continually in a  state of high stress.  Discussed strategies to address include engage in activities that help her be more in touch with her physical body, attempt to slow down and pace herself often this will require fairly basic changes in her priorities and her attitudes and overcoming isolation and reviewed worksheets on adult ADHD and highlighted patient has a internal negative critic, ruminating thoughts and helpful for her to label them that will help her in beginning to make changes, with rumination helpful to switch direction when she identifies ruminating, labeling by saying things like "there is worry" also discussed labeling self critic and then changed to something more accurate and helpful such as "remind yourself I am a work in progress.  Discussed other strategies such as having fun, improving sleep, and increase structure, viewed breathing and how breathing helps 1 to become more mindful and present good strategy to help her get out of her head.  We will continue to work on mindfulness next session.  Provided strength based on supportive intervention  Suicidal/Homicidal: No  Plan: Return again in 1-2 weeks.2.  Therapist continue to work with patient on self-esteem, strategies to decrease anxiety, stress management  Diagnosis: Axis I:  GAD                     MDD recurrent, moderate                      ADHD Inattentive type    Axis II: No diagnosis    Cordella Register, LCSW 08/27/2018

## 2018-09-03 ENCOUNTER — Encounter: Payer: Self-pay | Admitting: Osteopathic Medicine

## 2018-09-03 ENCOUNTER — Ambulatory Visit (INDEPENDENT_AMBULATORY_CARE_PROVIDER_SITE_OTHER): Payer: No Typology Code available for payment source | Admitting: Osteopathic Medicine

## 2018-09-03 VITALS — BP 135/71 | HR 64 | Temp 98.1°F | Wt 223.8 lb

## 2018-09-03 DIAGNOSIS — G4733 Obstructive sleep apnea (adult) (pediatric): Secondary | ICD-10-CM

## 2018-09-03 DIAGNOSIS — Z1239 Encounter for other screening for malignant neoplasm of breast: Secondary | ICD-10-CM

## 2018-09-03 DIAGNOSIS — Z9989 Dependence on other enabling machines and devices: Secondary | ICD-10-CM

## 2018-09-03 DIAGNOSIS — Z87898 Personal history of other specified conditions: Secondary | ICD-10-CM

## 2018-09-03 DIAGNOSIS — I10 Essential (primary) hypertension: Secondary | ICD-10-CM

## 2018-09-03 DIAGNOSIS — Z0000 Encounter for general adult medical examination without abnormal findings: Secondary | ICD-10-CM | POA: Diagnosis not present

## 2018-09-03 DIAGNOSIS — R748 Abnormal levels of other serum enzymes: Secondary | ICD-10-CM

## 2018-09-03 DIAGNOSIS — Z8639 Personal history of other endocrine, nutritional and metabolic disease: Secondary | ICD-10-CM

## 2018-09-03 DIAGNOSIS — K7581 Nonalcoholic steatohepatitis (NASH): Secondary | ICD-10-CM

## 2018-09-03 DIAGNOSIS — Z1211 Encounter for screening for malignant neoplasm of colon: Secondary | ICD-10-CM

## 2018-09-03 MED ORDER — HYDROCHLOROTHIAZIDE 25 MG PO TABS: 25.0000 mg | ORAL_TABLET | Freq: Every day | ORAL | 3 refills | Status: DC

## 2018-09-03 MED ORDER — AMBULATORY NON FORMULARY MEDICATION: 0 refills | Status: DC

## 2018-09-03 NOTE — Progress Notes (Addendum)
HPI: Melanie [name] is a 53 y.o. female who  has no past medical history on file.  she presents to [facility] today, 09/03/18,  for chief complaint of: Annual physical     Patient here for annual physical / wellness exam.  See preventive care reviewed as below.  Recent labs reviewed from 02/2018: AST/ALT, Glc, A1C, Lipids elevated   Additional concerns today include:  CPAP needs new equipment: using machine and receiving benefit from this therapy Referral for Chiropractics, Massage covered thru [facility] plan?     Past medical, surgical, social and family history reviewed:  Patient Active Problem List   Diagnosis Date Noted  . Attention deficit hyperactivity disorder (ADHD), predominantly inattentive type 02/27/2018  . Arthralgia 02/27/2018  . Screening for malignant neoplasm of colon 02/27/2018  . H/O: hysterectomy 02/27/2018  . History of prediabetes 02/27/2018  . History of hypothyroidism 02/27/2018  . History of depression 02/27/2018  . History of anxiety 02/27/2018  . History of ADHD 02/27/2018  . NASH (nonalcoholic steatohepatitis) 02/27/2018  . GAD (generalized anxiety disorder) 01/06/2017  . Moderate episode of recurrent major depressive disorder ([place]) 01/06/2017  . Anxiety 09/12/2015  . Environmental allergies 09/12/2015  . Essential hypertension 09/12/2015  . OSA on CPAP 09/12/2015    Past Surgical History:  Procedure Laterality Date  . ABDOMINAL HYSTERECTOMY    . MYOMECTOMY      Social History   Tobacco Use  . Smoking status: Never Smoker  . Smokeless tobacco: Never Used  Substance Use Topics  . Alcohol use: Yes    Alcohol/week: 1.0 standard drinks    Types: 1 Cans of beer per week    Comment: occ    Family History  Problem Relation Age of Onset  . Lung cancer Father   . Diabetes Mother   . Skin cancer Mother      Current medication list and allergy/intolerance information reviewed:    Current  Outpatient Medications  Medication Sig Dispense Refill  . amphetamine-dextroamphetamine (ADDERALL) 10 MG tablet Take 1 tablet (10 mg total) by mouth daily. 30 tablet 0  . aspirin EC 81 MG tablet Take 81 mg by mouth daily.    . cholecalciferol (VITAMIN D3) 25 MCG (1000 UT) tablet Take 2,000 Units by mouth daily.    Marland Kitchen escitalopram (LEXAPRO) 10 MG tablet Take 1 tablet (10 mg total) by mouth daily. 30 tablet 0  . Omega-3 Fatty Acids (FISH OIL) 1000 MG CAPS Take 1,400 mg by mouth.    . vitamin B-12 (CYANOCOBALAMIN) 500 MCG tablet Take 2,500 mcg by mouth daily.    . AMBULATORY NON FORMULARY MEDICATION Continuous positive airway pressure (CPAP) machine auto-titrate to max 20 cm of H2O pressure, with all supplemental supplies as needed. 1 each 0  . hydrochlorothiazide (HYDRODIURIL) 25 MG tablet Take 1 tablet (25 mg total) by mouth daily. 90 tablet 3   No current facility-administered medications for this visit.     Allergies  Allergen Reactions  . Clarithromycin Other (See Comments) and Rash    Other Reaction: Fever  . Morphine Other (See Comments)    Other Reaction: mental status alerted  . Sulfa Antibiotics Hives      Review of Systems:  Constitutional:  No  fever, no chills, No recent illness, No unintentional weight changes. No significant fatigue.   HEENT: No  headache, no vision change  Cardiac: No  chest pain, No  pressure, No palpitations, No  Orthopnea  Respiratory:  No  shortness of breath. No  Cough  Gastrointestinal: No  abdominal pain, No  nausea, No  vomiting,  No  blood in stool, No  diarrhea, No  constipation   Musculoskeletal: No new myalgia/arthralgia  Skin: No  Rash, No other wounds/concerning lesions  Genitourinary: No  incontinence, No  abnormal genital bleeding, No abnormal genital discharge  Hem/Onc: No  easy bruising/bleeding, No  abnormal lymph node  Endocrine: No cold intolerance,  No heat intolerance. No polyuria/polydipsia/polyphagia   Neurologic: No   weakness, No  dizziness  Psychiatric: No  concerns with depression, No  concerns with anxiety, No sleep problems, No mood problems  Exam:  BP 135/71 (BP Location: Left Arm, Patient Position: Sitting, Cuff Size: Normal)   Pulse 64   Temp 98.1 F (36.7 C) (Oral)   Wt 223 lb 12.8 oz (101.5 kg)   BMI 35.05 kg/m   Constitutional: VS see above. General Appearance: alert, well-developed, well-nourished, NAD  Eyes: Normal lids and conjunctive, non-icteric sclera  Ears, Nose, Mouth, Throat: MMM, Normal external inspection ears/nares/mouth/lips/gums. TM normal bilaterally. Pharynx/tonsils no erythema, no exudate. Nasal mucosa normal.   Neck: No masses, trachea midline. No thyroid enlargement. No tenderness/mass appreciated. No lymphadenopathy  Respiratory: Normal respiratory effort. no wheeze, no rhonchi, no rales  Cardiovascular: S1/S2 normal, no murmur, no rub/gallop auscultated. RRR. No lower extremity edema.  Gastrointestinal: Nontender, no masses. No hepatomegaly, no splenomegaly. No hernia appreciated. Bowel sounds normal. Rectal exam deferred.   Musculoskeletal: Gait normal. No clubbing/cyanosis of digits.   Neurological: Normal balance/coordination. No tremor. No cranial nerve deficit on limited exam. Motor and sensation intact and symmetric. Cerebellar reflexes intact.   Skin: warm, dry, intact. No rash/ulcer. No concerning nevi or subq nodules on limited exam.    Psychiatric: Normal judgment/insight. Normal mood and affect. Oriented x3.      ASSESSMENT/PLAN: The primary encounter diagnosis was Annual physical exam. Diagnoses of Elevated liver enzymes, NASH (nonalcoholic steatohepatitis), History of prediabetes, History of hypothyroidism, OSA on CPAP, Essential hypertension, Screening for breast cancer, and Screen for colon cancer were also pertinent to this visit.   Pt asked to check w/ Focus plan re: providers for chiropractics and massage, can MyChart message me w/ names  and I can place referrals   Requests thyroid testing, was previously on Rx   Orders Placed This Encounter  Procedures  . MM 3D SCREEN BREAST BILATERAL  . COMPLETE METABOLIC PANEL WITH GFR  . Hemoglobin A1c  . TSH  . T4, free  . Thyroid peroxidase antibody  . Lipid panel  . Cologuard    Meds ordered this encounter  Medications  . hydrochlorothiazide (HYDRODIURIL) 25 MG tablet    Sig: Take 1 tablet (25 mg total) by mouth daily.    Dispense:  90 tablet    Refill:  3  . AMBULATORY NON FORMULARY MEDICATION    Sig: Continuous positive airway pressure (CPAP) machine auto-titrate to max 20 cm of H2O pressure, with all supplemental supplies as needed.    Dispense:  1 each    Refill:  0    Patient Instructions  General Preventive Care  Most recent routine screening lipids/other labs: looked ok in 02/2018 but should recheck liver enzymes and sugars to keep an eye on these.   Everyone should have blood pressure checked once per year. Goal 130/80  Tobacco: don't!  Alcohol: responsible moderation is ok for most adults - if you have concerns about your alcohol intake, please talk to me!   Exercise: as tolerated  to reduce risk of cardiovascular disease and diabetes. Strength training will also prevent osteoporosis.   Mental health: if need for mental health care (medicines, counseling, other), or concerns about moods, please let me know!   Sexual health: if need for STD testing, or if concerns with libido/pain problems, please let me know!   Advanced Directive: Living Will and/or Healthcare Power of Attorney recommended for all adults, regardless of age or health.  Vaccines  Flu vaccine: recommended for almost everyone, every fall.   Shingles vaccine: Shingrix recommended after age 28. Will put you on our waiting list here and you will get a call from our office when vaccine is ready for you!   Pneumonia vaccines: recommended after age 66  Tetanus booster: Tdap recommended every  10 years. Done 11/2015 Cancer screenings   Colon cancer screening: will send Cologuard, let me know if you change your mind and I can send a referral.   Breast cancer screening: mammogram annually after age 93.   Cervical cancer screening: Can stop after hysterectomy.   Lung cancer screening: not needed for non-smokers  Infection screenings . HIV, Gonorrhea/Chlamydia: screening as needed . Hepatitis C: recommended for anyone born 30-1964 = not needed  . TB: certain at-risk populations, or depending on work requirements and/or travel history Other . Bone Density Test: recommended for women at age 32        Visit summary with medication list and pertinent instructions was printed for patient to review. All questions at time of visit were answered - patient instructed to contact office with any additional concerns or updates. ER/RTC precautions were reviewed with the patient.    Please note: voice recognition software was used to produce this document, and typos may escape review. Please contact Dr. Sheppard Coil for any needed clarifications.     Follow-up plan: Return for recheck depending on labs, for sure follow-up in 6 mos recheck labs .

## 2018-09-03 NOTE — Patient Instructions (Signed)
General Preventive Care  Most recent routine screening lipids/other labs: looked ok in 02/2018 but should recheck liver enzymes and sugars to keep an eye on these.   Everyone should have blood pressure checked once per year. Goal 130/80  Tobacco: don't!  Alcohol: responsible moderation is ok for most adults - if you have concerns about your alcohol intake, please talk to me!   Exercise: as tolerated to reduce risk of cardiovascular disease and diabetes. Strength training will also prevent osteoporosis.   Mental health: if need for mental health care (medicines, counseling, other), or concerns about moods, please let me know!   Sexual health: if need for STD testing, or if concerns with libido/pain problems, please let me know!   Advanced Directive: Living Will and/or Healthcare Power of Attorney recommended for all adults, regardless of age or health.  Vaccines  Flu vaccine: recommended for almost everyone, every fall.   Shingles vaccine: Shingrix recommended after age 4. Will put you on our waiting list here and you will get a call from our office when vaccine is ready for you!   Pneumonia vaccines: recommended after age 68  Tetanus booster: Tdap recommended every 10 years. Done 11/2015 Cancer screenings   Colon cancer screening: will send Cologuard, let me know if you change your mind and I can send a referral.   Breast cancer screening: mammogram annually after age 65.   Cervical cancer screening: Can stop after hysterectomy.   Lung cancer screening: not needed for non-smokers  Infection screenings . HIV, Gonorrhea/Chlamydia: screening as needed . Hepatitis C: recommended for anyone born 24-1964 = not needed  . TB: certain at-risk populations, or depending on work requirements and/or travel history Other . Bone Density Test: recommended for women at age 60

## 2018-09-04 ENCOUNTER — Telehealth: Payer: Self-pay | Admitting: Osteopathic Medicine

## 2018-09-04 NOTE — Telephone Encounter (Signed)
Added

## 2018-09-04 NOTE — Telephone Encounter (Signed)
-----   Message from Emeterio Reeve, DO sent at 09/03/2018  2:39 PM EST ----- Regarding: shingrix Shingles list please and thanks!

## 2018-09-07 ENCOUNTER — Encounter: Payer: Self-pay | Admitting: Osteopathic Medicine

## 2018-09-17 ENCOUNTER — Ambulatory Visit (HOSPITAL_COMMUNITY): Payer: No Typology Code available for payment source | Admitting: Licensed Clinical Social Worker

## 2018-09-21 ENCOUNTER — Telehealth: Payer: Self-pay

## 2018-09-21 MED ORDER — AMBULATORY NON FORMULARY MEDICATION: 0 refills | Status: DC

## 2018-09-21 MED FILL — AMPHETAMINE-DEXTROAMPHETAMI: 10 | 30 days supply | Qty: 30 | Fill #0

## 2018-09-21 MED FILL — ESCITALOPRAM 10 MG TABLET: 10 | 30 days supply | Qty: 30 | Fill #0

## 2018-09-21 MED FILL — HYDROCHLOROTHIAZIDE 25 MG T: 25 | 90 days supply | Qty: 90 | Fill #0

## 2018-09-21 NOTE — Telephone Encounter (Signed)
Attempted to contact Melanie [name] at [facility] for a fax number, no answer. No option to leave a vm msg. Will attempt  to call later on today.

## 2018-09-21 NOTE — Telephone Encounter (Signed)
Melanie [name] from [facility] called regarding a new rx. She is requesting a corrected rx indicating the autopressure setting for CPAP machine, along with most recent OV notes indicating need for CPAP.

## 2018-09-21 NOTE — Telephone Encounter (Signed)
Printed note and Rx placed I nVanicia's basket

## 2018-09-24 ENCOUNTER — Other Ambulatory Visit: Payer: Self-pay

## 2018-09-24 ENCOUNTER — Encounter: Payer: Self-pay | Admitting: Sports Medicine

## 2018-09-24 ENCOUNTER — Ambulatory Visit (INDEPENDENT_AMBULATORY_CARE_PROVIDER_SITE_OTHER): Payer: No Typology Code available for payment source | Admitting: Sports Medicine

## 2018-09-24 ENCOUNTER — Ambulatory Visit (INDEPENDENT_AMBULATORY_CARE_PROVIDER_SITE_OTHER): Payer: No Typology Code available for payment source | Admitting: Licensed Clinical Social Worker

## 2018-09-24 DIAGNOSIS — F331 Major depressive disorder, recurrent, moderate: Secondary | ICD-10-CM | POA: Diagnosis not present

## 2018-09-24 DIAGNOSIS — F900 Attention-deficit hyperactivity disorder, predominantly inattentive type: Secondary | ICD-10-CM | POA: Diagnosis not present

## 2018-09-24 DIAGNOSIS — F411 Generalized anxiety disorder: Secondary | ICD-10-CM | POA: Diagnosis not present

## 2018-09-24 DIAGNOSIS — H02844 Edema of left upper eyelid: Secondary | ICD-10-CM

## 2018-09-24 MED ORDER — AZELASTINE HCL 0.05 % OP SOLN: 2.0000 [drp] | Freq: Two times a day (BID) | OPHTHALMIC | 11 refills | Status: DC

## 2018-09-24 MED ORDER — ERYTHROMYCIN 5 MG/GM OP OINT: 1.0000 "application " | TOPICAL_OINTMENT | Freq: Three times a day (TID) | OPHTHALMIC | 0 refills | Status: AC

## 2018-09-24 MED FILL — AZELASTINE HCL 0.05 % SOLN: 0.05 | 13 days supply | Qty: 6 | Fill #0

## 2018-09-24 MED FILL — ERYTHROMYCIN EYE OINTMENT: 5 | 5 days supply | Qty: 4 | Fill #0

## 2018-09-24 NOTE — Progress Notes (Signed)
Subjective:    CC: Eye problem  HPI: For the past couple weeks this pleasant 53 year old female has noted a slight redness, swelling on her left upper eyelid.  No visual changes, no red eye, no contralateral symptoms.  No upper respiratory symptoms, fevers, chills, myalgias.  No trauma to the eye.  Denies any itching, mostly pain and warmth.  She does report however that sometime later this month and April she generally gets significant allergic conjunctivitis symptoms.  I reviewed the past medical history, family history, social history, surgical history, and allergies today and no changes were needed.  Please see the problem list section below in epic for further details.  Past Medical History: No past medical history on file. Past Surgical History: Past Surgical History:  Procedure Laterality Date  . ABDOMINAL HYSTERECTOMY    . MYOMECTOMY     Social History: Social History   Socioeconomic History  . Marital status: Divorced    Spouse name: Not on file  . Number of children: Not on file  . Years of education: Not on file  . Highest education level: Not on file  Occupational History  . Not on file  Social Needs  . Financial resource strain: Not on file  . Food insecurity:    Worry: Not on file    Inability: Not on file  . Transportation needs:    Medical: Not on file    Non-medical: Not on file  Tobacco Use  . Smoking status: Never Smoker  . Smokeless tobacco: Never Used  Substance and Sexual Activity  . Alcohol use: Yes    Alcohol/week: 1.0 standard drinks    Types: 1 Cans of beer per week    Comment: occ  . Drug use: Never  . Sexual activity: Not Currently    Birth control/protection: Surgical  Lifestyle  . Physical activity:    Days per week: Not on file    Minutes per session: Not on file  . Stress: Not on file  Relationships  . Social connections:    Talks on phone: Not on file    Gets together: Not on file    Attends religious service: Not on file   Active member of club or organization: Not on file    Attends meetings of clubs or organizations: Not on file    Relationship status: Not on file  Other Topics Concern  . Not on file  Social History Narrative  . Not on file   Family History: Family History  Problem Relation Age of Onset  . Lung cancer Father   . Diabetes Mother   . Skin cancer Mother    Allergies: Allergies  Allergen Reactions  . Clarithromycin Other (See Comments) and Rash    Other Reaction: Fever  . Morphine Other (See Comments)    Other Reaction: mental status alerted  . Sulfa Antibiotics Hives   Medications: See med rec.  Review of Systems: No fevers, chills, night sweats, weight loss, chest pain, or shortness of breath.   Objective:    General: Well Developed, well nourished, and in no acute distress.  Neuro: Alert and oriented x3, extra-ocular muscles intact, sensation grossly intact.  HEENT: Normocephalic, atraumatic, pupils equal round reactive to light, neck supple, no masses, no lymphadenopathy, thyroid nonpalpable.  Left upper eyelid is slightly erythematous, no palpable fluid collections, no obvious visible stye or chalazion. Skin: Warm and dry, no rashes. Cardiac: Regular rate and rhythm, no murmurs rubs or gallops, no lower extremity edema.  Respiratory: Clear  to auscultation bilaterally. Not using accessory muscles, speaking in full sentences.  Impression and Recommendations:    Swelling of left upper eyelid Unclear as to whether this is a stye or chalazion. Symptoms are mild and superimposed on allergic conjunctivitis. Adding topical erythromycin. Also adding azelastine to use when spring comes in full. No visual changes.   ___________________________________________ [name]. [name], [name], ABFM., CAQSM. Primary Care and Sports Medicine [facility]  Adjunct Professor of [employer]

## 2018-09-24 NOTE — Progress Notes (Signed)
THERAPIST PROGRESS NOTE  Session Time: 1:02 PM to 1:58 PM  Participation Level: Active  Behavioral Response: CasualAlertsubdued  Type of Therapy: Individual Therapy  Treatment Goals addressed:   reduce anxiety and increased satisfaction with accomplishments Interventions: CBT, Solution Focused, Strength-based, Supportive, Meditation: mindfulness walk, color breathing and Other: coping  Summary: Melanie [name] is a 53 y.o. female who presents with physical and emotional exhaustion, little too much sleeping, not jounaling, anxiety ramping up, describes liking sleeping.  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  Also feels time change she still adjusting to, has a hard time getting up in the morning anyway.  Shares has not been active with working on some of the coping strategies talked about such as Research officer, trade union.  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  Patient also shared concerns about herself in this environment and also she is older with medical issues.  Shares does not want to go overboard with worry but still feels that way.  Shares she is physically and emotionally exhausted.  Feels limited exposure to news would be helpful but also likes knowing what is going on.  Gust cognitive strategies that would help with anxiety such as not feeding into things or anxiety that is not helpful but reacting in ways to situation that are helpful and effective.  Therapist shared right now for patient recommended engagement in relaxation strategies to help her distress.  Reviewed mindfulness walking discuss it as being present focused that helps to not be focused on negative cognitions, also involves exercise both meditation and exercise are good for stress, good for positive mood.  Explained  meditation is also helping in gaining perspective of one's thoughts, can see for example cognitive distortions that cause negative mood.  Asked about replacing unhelpful worry with more helpful things such as exercise.  Therapist related also serves as distraction that helps de-escalate, then person can revisit stress and better able to find healthy coping strategies.  Reviewed color breathing as another possible relaxation strategy.  The different colors signify different peaceful and positive mood and to incorporate into deep breathing.  Reviewed session patient relates coronavirus has been a major distraction and draining a lot of her energy but that she could put elsewhere also sees that everybody is like that now.  Reviewed relaxation strategies would help with stress during session and also strategy of acknowledging thought and letting it go so as not to stay in negative state of anxiety.   Therapist assessed patient current functioning per report and processed feelings for current stressors noted elevation of anxiety.  Worked on coping strategies to include cognitive strategies to manage anxiety such as simply expressing feelings helps in reduction and subjective relief. Discussed naming your fears, that the total involves the skill of taking the vague dread of anxiety and turning it into number of specific concerns, helps reduce over estimating and ability to cope as gaining better understanding of strategies to use that help you recognizes you can cope.  Shared in managing anxiety better to allow the thought and then walking to the door of your mind and release it and when it returns to do the same thing again, trying to suppress it does the opposite and increases negative emotion.  Also discussed exercise is both helpful for positive mood, introduced mindfulness walk and discuss  how can turn a walk into a meditation by using senses to notice what is around you.  Discussed mindfulness helps 1 to have  perspective of one's own thoughts, but also can help you get out of your thoughts that helps his delusional regulation impaired tension to what surround you, as a relaxation strategy can help decrease stress.  Reviewed relaxation strategies as helpful right now in decreasing anxiety and stress, looked at taking a rational approach to managing stress such as virus and recognizing involving oneself and emotions as not helpful.  Discussed distraction as an emotional regulation strategy that helps to calm down and then when calmer to be able to develop more effective coping strategies.  Discussed not overwhelming oneself by taking things day by day.  Provided supportive and strength-based intervention  Suicidal/Homicidal: No  Plan: Return again in 2 weeks. 2.  Therapist continue to work with patient on decreasing anxiety, management of ADHD symptoms, coping. 3.  Patient to her review worksheet titled "understanding perfectionism" and handout on "Tips for Managing ADHD"  Diagnosis: Axis I:   GAD                     MDD recurrent, moderate                      ADHD Inattentive type    Axis II: No diagnosis    Cordella Register, LCSW 09/24/2018

## 2018-09-24 NOTE — Assessment & Plan Note (Signed)
Unclear as to whether this is a stye or chalazion. Symptoms are mild and superimposed on allergic conjunctivitis. Adding topical erythromycin. Also adding azelastine to use when spring comes in full. No visual changes.

## 2018-09-25 ENCOUNTER — Telehealth: Payer: Self-pay

## 2018-09-25 DIAGNOSIS — Z9109 Other allergy status, other than to drugs and biological substances: Secondary | ICD-10-CM

## 2018-09-25 NOTE — Telephone Encounter (Signed)
Left a message for a return call about where to send the medication.

## 2018-09-25 NOTE — Telephone Encounter (Signed)
[facility].  Where to since [facility] closed?

## 2018-09-25 NOTE — Telephone Encounter (Signed)
Melanie [name] called and states she would like a 90 day prescription of Xyzal. Not on current medication list.

## 2018-09-26 NOTE — Telephone Encounter (Signed)
Updated CPAP rx with most recent office visit faxed to [facility] at [phone]. Confirmation rec'd.

## 2018-09-27 MED ORDER — FLUTICASONE PROPIONATE 50 MCG/ACT NA SUSP: NASAL | 3 refills | Status: DC

## 2018-09-27 MED ORDER — LEVOCETIRIZINE DIHYDROCHLORIDE 5 MG PO TABS: 5.0000 mg | ORAL_TABLET | Freq: Every evening | ORAL | 1 refills | Status: DC

## 2018-09-27 NOTE — Telephone Encounter (Signed)
Done

## 2018-09-27 NOTE — Addendum Note (Signed)
Addended by: Silverio Decamp on: 09/27/2018 04:58 PM   Modules accepted: Orders

## 2018-09-27 NOTE — Telephone Encounter (Signed)
Pt called states wants to use [facility] for Xyzal. Pt also request Flonase called in. Pt state she did confirm with [facility] they are accepting prescriptions.

## 2018-09-27 NOTE — Telephone Encounter (Addendum)
Xyzal sent.  Flonase not on med list: OK to RF?

## 2018-09-28 NOTE — Telephone Encounter (Signed)
Left pt msg advising RX sent

## 2018-10-01 ENCOUNTER — Encounter (HOSPITAL_COMMUNITY): Payer: Self-pay | Admitting: Psychiatry

## 2018-10-01 ENCOUNTER — Ambulatory Visit (INDEPENDENT_AMBULATORY_CARE_PROVIDER_SITE_OTHER): Payer: No Typology Code available for payment source | Admitting: Psychiatry

## 2018-10-01 ENCOUNTER — Other Ambulatory Visit: Payer: Self-pay

## 2018-10-01 DIAGNOSIS — F909 Attention-deficit hyperactivity disorder, unspecified type: Secondary | ICD-10-CM

## 2018-10-01 DIAGNOSIS — F411 Generalized anxiety disorder: Secondary | ICD-10-CM | POA: Diagnosis not present

## 2018-10-01 DIAGNOSIS — F900 Attention-deficit hyperactivity disorder, predominantly inattentive type: Secondary | ICD-10-CM

## 2018-10-01 DIAGNOSIS — F331 Major depressive disorder, recurrent, moderate: Secondary | ICD-10-CM | POA: Diagnosis not present

## 2018-10-01 MED ORDER — AMPHETAMINE-DEXTROAMPHET ER 15 MG PO CP24: 15.0000 mg | ORAL_CAPSULE | Freq: Every day | ORAL | 0 refills | Status: DC

## 2018-10-01 MED ORDER — ESCITALOPRAM OXALATE 10 MG PO TABS: 15.0000 mg | ORAL_TABLET | Freq: Every day | ORAL | 1 refills | Status: DC

## 2018-10-01 NOTE — Progress Notes (Signed)
[facility] virtual tele medicine fu  Patient Identification: Melanie [name] MRN:  [id number] Date of Evaluation:  10/01/2018 Referral Source: primary care Chief Complaint:    Visit Diagnosis:  No diagnosis found.  History of Present Illness: 53 years old currently single Caucasian female referred by primary care physician for management of depression and anxiety. Works as Therapist, sports at [employer]   I  connected with [name] on 10/01/18 at  1:00 PM EDT by telephone and verified that I am speaking with the correct person using two identifiers.   I discussed the limitations, risks, security and privacy concerns of performing an evaluation and management service by telephone and the availability of in person appointments. I also discussed with the patient that there may be a patient responsible charge related to this service. The patient expressed understanding and agreed to proceed.  adderall helps but gets distracted if long hours lexapro helped some anxiety but still somewhat subdued  Patient has been on different medication in the past but noncompliant history.  Works as Health visitor, somewhat anxious since virus outbreak   Modifying factor mom Aggravating factors; being single no kids  Duration on and off for 7 plus years Severity of depression 6/10 No associated psychotic symptoms no manic symptoms currently or in the past Denies drug use   Past Psychiatric History: depression  Previous Psychotropic Medications: Yes   Substance Abuse History in the last 12 months:  No.  Consequences of Substance Abuse: NA  Past Medical History: History reviewed. No pertinent past medical history.  Past Surgical History:  Procedure Laterality Date  . ABDOMINAL HYSTERECTOMY    . MYOMECTOMY      Family Psychiatric History: depression. Mom has depression, sister has Depression, anxiety  Family History:  Family History  Problem Relation Age of Onset  . Lung cancer Father   .  Diabetes Mother   . Skin cancer Mother     Social History:   Social History   Socioeconomic History  . Marital status: Divorced    Spouse name: Not on file  . Number of children: Not on file  . Years of education: Not on file  . Highest education level: Not on file  Occupational History  . Not on file  Social Needs  . Financial resource strain: Not on file  . Food insecurity:    Worry: Not on file    Inability: Not on file  . Transportation needs:    Medical: Not on file    Non-medical: Not on file  Tobacco Use  . Smoking status: Never Smoker  . Smokeless tobacco: Never Used  Substance and Sexual Activity  . Alcohol use: Yes    Alcohol/week: 1.0 standard drinks    Types: 1 Cans of beer per week    Comment: occ  . Drug use: Never  . Sexual activity: Not Currently    Birth control/protection: Surgical  Lifestyle  . Physical activity:    Days per week: Not on file    Minutes per session: Not on file  . Stress: Not on file  Relationships  . Social connections:    Talks on phone: Not on file    Gets together: Not on file    Attends religious service: Not on file    Active member of club or organization: Not on file    Attends meetings of clubs or organizations: Not on file    Relationship status: Not on file  Other Topics Concern  . Not  on file  Social History Narrative  . Not on file      Allergies:   Allergies  Allergen Reactions  . Clarithromycin Other (See Comments) and Rash    Other Reaction: Fever  . Morphine Other (See Comments)    Other Reaction: mental status alerted  . Sulfa Antibiotics Hives    Metabolic Disorder Labs: Lab Results  Component Value Date   HGBA1C 6.2 (H) 02/27/2018   MPG 131 02/27/2018   No results found for: PROLACTIN Lab Results  Component Value Date   CHOL 283 (H) 02/27/2018   TRIG 281 (H) 02/27/2018   HDL 54 02/27/2018   CHOLHDL 5.2 (H) 02/27/2018   LDLCALC 179 (H) 02/27/2018   Lab Results  Component Value Date    TSH 2.81 02/27/2018    Therapeutic Level Labs: No results found for: LITHIUM No results found for: CBMZ No results found for: VALPROATE  Current Medications: Current Outpatient Medications  Medication Sig Dispense Refill  . AMBULATORY NON FORMULARY MEDICATION Continuous positive airway pressure (CPAP) machine auto-titrate to max 20 cm of H2O pressure, with all supplemental supplies as needed. 1 each 0  . amphetamine-dextroamphetamine (ADDERALL XR) 15 MG 24 hr capsule Take 1 capsule by mouth daily. 30 capsule 0  . aspirin EC 81 MG tablet Take 81 mg by mouth daily.    Marland Kitchen azelastine (OPTIVAR) 0.05 % ophthalmic solution Place 2 drops into both eyes 2 (two) times daily. 6 mL 11  . cholecalciferol (VITAMIN D3) 25 MCG (1000 UT) tablet Take 2,000 Units by mouth daily.    Marland Kitchen escitalopram (LEXAPRO) 10 MG tablet Take 1.5 tablets (15 mg total) by mouth daily. 45 tablet 1  . fluticasone (FLONASE) 50 MCG/ACT nasal spray One spray in each nostril twice a day, use left hand for right nostril, and right hand for left nostril. 48 g 3  . hydrochlorothiazide (HYDRODIURIL) 25 MG tablet Take 1 tablet (25 mg total) by mouth daily. 90 tablet 3  . levocetirizine (XYZAL) 5 MG tablet Take 1 tablet (5 mg total) by mouth every evening. 90 tablet 1  . Omega-3 Fatty Acids (FISH OIL) 1000 MG CAPS Take 1,400 mg by mouth.    . vitamin B-12 (CYANOCOBALAMIN) 500 MCG tablet Take 2,500 mcg by mouth daily.     No current facility-administered medications for this visit.       Psychiatric Specialty Exam: Review of Systems  Constitutional: Positive for malaise/fatigue.  Cardiovascular: Negative for chest pain.  Skin: Negative for itching.  Neurological: Negative for tremors.    There were no vitals taken for this visit.There is no height or weight on file to calculate BMI.  General Appearance:  Eye Contact:    Speech:  Normal Rate  Volume:  Decreased  Mood: somewhat subdued  Affect:    Thought Process:  Goal  Directed  Orientation:  Full (Time, Place, and Person)  Thought Content:  Logical  Suicidal Thoughts:  No  Homicidal Thoughts:  No  Memory:  Immediate;   Fair Recent;   Fair  Judgement:  Fair  Insight:  Fair  Psychomotor Activity:   Concentration:  Concentration: Fair and Attention Span: Fair  Recall:  [employer] of Knowledge:Fair  Language: Good  Akathisia:  No  Handed:  Right  AIMS (if indicated):  not done  Assets:  Desire for Improvement  ADL's:  Intact  Cognition: WNL  Sleep:  Fair   Screenings: GAD-7     Office Visit from 09/03/2018 in [facility] [place] Visit from 06/04/2018 in [place]  Total GAD-7 Score  7  13    PHQ2-9     Office Visit from 09/03/2018 in [place] Office Visit from 06/04/2018 in [place] Office Visit from 02/27/2018 in [place]  PHQ-2 Total Score  3  4  1   PHQ-9 Total Score  11  19  8       Assessment and Plan: as follows  MDD , moderate recurrent: somewhat subdued, lexapro does help, will increase to 59m GCEY:EMVVKPQAES increase lexapro to 12mAdHD:gets distracted if long hours, wants to change to xr will change to 1066mr Cut down coffee intake Will send meds I discussed the assessment and treatment plan with the patient. The patient was provided an opportunity to ask questions and all were answered. The patient agreed with the plan and demonstrated an understanding of the instructions.   The patient was advised to call back or seek an in-person evaluation if the symptoms worsen or if the condition fails to improve as anticipated.  I provided 20 minutes of non-face-to-face time during this encounter.  NadMerian CapronD 3/30/20201:08 PM

## 2018-10-02 ENCOUNTER — Ambulatory Visit (HOSPITAL_COMMUNITY): Payer: No Typology Code available for payment source | Admitting: Licensed Clinical Social Worker

## 2018-10-05 MED FILL — LEVOCETIRIZINE 5 MG TABLET: 5 | 90 days supply | Qty: 90 | Fill #0

## 2018-10-05 MED FILL — ADDERALL XR 15 MG CAP SA: 15 | 30 days supply | Qty: 30 | Fill #0

## 2018-10-05 MED FILL — FLUTICASONE PROP 50 MCG SPR: 50 | 90 days supply | Qty: 48 | Fill #0

## 2018-10-11 ENCOUNTER — Telehealth: Payer: Self-pay | Admitting: Osteopathic Medicine

## 2018-10-11 DIAGNOSIS — G4733 Obstructive sleep apnea (adult) (pediatric): Secondary | ICD-10-CM

## 2018-10-11 NOTE — Telephone Encounter (Signed)
Left a detailed vm msg for pt regarding provider's note / sleep study. Direct call back info provided.

## 2018-10-11 NOTE — Telephone Encounter (Signed)
Please call patient: I received some communication from [facility] requesting previous sleep study results.  I was unable to locate these anywhere in the chart.  If Melanie [name] has a copy at home somewhere, that would be helpful.  Otherwise, [facility] /insurance is going to require a repeat sleep study. Order placed for repeat sleep study if needed, can cancel if Melanie [name] has a report of her sleep study at home.

## 2018-10-16 ENCOUNTER — Ambulatory Visit (HOSPITAL_COMMUNITY): Payer: No Typology Code available for payment source | Admitting: Licensed Clinical Social Worker

## 2018-11-13 ENCOUNTER — Telehealth: Payer: Self-pay

## 2018-11-13 DIAGNOSIS — M7918 Myalgia, other site: Secondary | ICD-10-CM

## 2018-11-13 MED FILL — ESCITALOPRAM 10 MG TABLET: 10 | 30 days supply | Qty: 45 | Fill #0

## 2018-11-13 NOTE — Telephone Encounter (Signed)
Pt left a vm msg requesting an update on Chiropractor referral. Pls advise, thanks.

## 2018-11-14 NOTE — Telephone Encounter (Signed)
She has [employer] plan, I believe. With that plan, she needs to let me know where to send the referral cased on covered providers on the list online.   Previously referred to [facility] at [phone] and [phone], is this who she'd like to see?

## 2018-11-14 NOTE — Telephone Encounter (Signed)
Pt is requesting for referral to be for [facility]  (Dr. Artis Flock) located in [place]. Pt would also like a referral for Acupuncturist. As per pt, preferred location for referral is [facility]. Pt did not request a particular doctor for Acupuncturist. Pls advise, thanks.

## 2018-11-14 NOTE — Telephone Encounter (Signed)
Pt has been updated of provider's note regarding referrals. No other inquiries during call.

## 2018-11-14 NOTE — Telephone Encounter (Signed)
Order is in for both  Again, if she wants it to be covered under health insurance, she needs to check online which, if any, acupuncturists are covered under her plan. If it's outside insurance she does not need referral.

## 2018-11-27 ENCOUNTER — Telehealth: Payer: Self-pay | Admitting: Osteopathic Medicine

## 2018-11-27 ENCOUNTER — Ambulatory Visit (INDEPENDENT_AMBULATORY_CARE_PROVIDER_SITE_OTHER): Payer: No Typology Code available for payment source | Admitting: Osteopathic Medicine

## 2018-11-27 ENCOUNTER — Other Ambulatory Visit: Payer: Self-pay

## 2018-11-27 ENCOUNTER — Encounter: Payer: Self-pay | Admitting: Osteopathic Medicine

## 2018-11-27 VITALS — BP 134/76 | HR 63 | Temp 98.2°F | Wt 229.2 lb

## 2018-11-27 DIAGNOSIS — M791 Myalgia, unspecified site: Secondary | ICD-10-CM | POA: Insufficient documentation

## 2018-11-27 DIAGNOSIS — M2550 Pain in unspecified joint: Secondary | ICD-10-CM | POA: Diagnosis not present

## 2018-11-27 DIAGNOSIS — M2560 Stiffness of unspecified joint, not elsewhere classified: Secondary | ICD-10-CM | POA: Insufficient documentation

## 2018-11-27 DIAGNOSIS — M5030 Other cervical disc degeneration, unspecified cervical region: Secondary | ICD-10-CM

## 2018-11-27 DIAGNOSIS — R5383 Other fatigue: Secondary | ICD-10-CM

## 2018-11-27 NOTE — Progress Notes (Signed)
HPI: Melanie [name] is a 53 y.o. female who  has no past medical history on file.  she presents to [facility] today, 11/27/18,  for chief complaint of:  Joint pain   Joint pain, bothering her chronically for years.  Including right shoulder pain, neck pain with previous diagnosis of degenerative disc disease, lower back pain, right knee pain, right arm pain, pain in bilateral hands.  Reports has been to a couple of rheumatologists in the past, no definitive diagnosis.  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   Reports intermittent rashes, including rash on the face and eyelids about this time last year.  At some point a rash on her torso was biopsied, she does not recall exact diagnosis but the dermatologist reportedly had some kind of opinion about the pathology being incorrect, stating that if she had what ever they said it was, she would be dead.  History of cervical degenerative disc disease, has been seeing a chiropractor for this and for lower back pain.     At today's visit 11/27/18 ... PMH, PSH, FH reviewed and updated as needed.  Current medication list and allergy/intolerance hx reviewed and updated as needed. (See remainder of HPI, ROS, Phys Exam below)   No results found.  No results found for this or any previous visit (from the past 72 hour(s)).        ASSESSMENT/PLAN: The primary encounter diagnosis was Arthralgia, unspecified joint. Diagnoses of Myalgia, Morning stiffness of joints, Fatigue, unspecified type, and DDD (degenerative disc disease), cervical were also pertinent to this visit.   Suspect rheumatoid arthritis, see diagnostic criteria below, await lab results.  Sounds like incomplete past work-up/patient was not being taken seriously by her physicians.  Hopefully we will be  able to help.  Patient was counseled on the stepwise nature of diagnostic approach to widespread pain / fatigue.   Patient is having some cervical radicular symptoms, will get MRI of cervical spine given that patient has failed greater than 6 weeks of conservative management.  Orders Placed This Encounter  Procedures  . MR Cervical Spine Wo Contrast  . Sedimentation rate  . High sensitivity CRP  . ANA  . Rheumatoid factor  . Cyclic citrul peptide antibody, IgG  . Anti-Smith antibody  . Anti-DNA antibody, double-stranded     No orders of the defined types were placed in this encounter.   Patient Instructions  Plan:  Await lab results to see if you are meeting criteria for rheumatoid arthritis, based on your symptoms it sounds like this is certainly a possibility.  Lab results may take some time to come back, should not take more than a week, I will reach out to you as soon as I have results!  If all labs are normal, will discuss other possible conditions which may be causing your symptoms.   He should also hear back about scheduling the MRI for the neck, please let us know if you do not hear anything.       Follow-up plan: Return for Recheck depending on results - will call!Marland Kitchen                                                 ################################################# ################################################# ################################################# #################################################  Current Meds  Medication Sig  . AMBULATORY NON FORMULARY MEDICATION Continuous positive airway pressure (CPAP) machine auto-titrate to max 20 cm of H2O pressure, with all supplemental supplies as needed.  Marland Kitchen amphetamine-dextroamphetamine (ADDERALL XR) 15 MG 24 hr capsule Take 1 capsule by mouth daily.  Marland Kitchen aspirin EC 81 MG tablet Take 81 mg by mouth daily.  Marland Kitchen azelastine (OPTIVAR) 0.05 % ophthalmic solution  Place 2 drops into both eyes 2 (two) times daily.  . cholecalciferol (VITAMIN D3) 25 MCG (1000 UT) tablet Take 2,000 Units by mouth daily.  Marland Kitchen escitalopram (LEXAPRO) 10 MG tablet Take 1.5 tablets (15 mg total) by mouth daily.  . fluticasone (FLONASE) 50 MCG/ACT nasal spray One spray in each nostril twice a day, use left hand for right nostril, and right hand for left nostril.  . hydrochlorothiazide (HYDRODIURIL) 25 MG tablet Take 1 tablet (25 mg total) by mouth daily.  Marland Kitchen levocetirizine (XYZAL) 5 MG tablet Take 1 tablet (5 mg total) by mouth every evening.  . Omega-3 Fatty Acids (FISH OIL) 1000 MG CAPS Take 1,400 mg by mouth.  . vitamin B-12 (CYANOCOBALAMIN) 500 MCG tablet Take 2,500 mcg by mouth daily.    Allergies  Allergen Reactions  . Clarithromycin Other (See Comments) and Rash    Other Reaction: Fever  . Morphine Other (See Comments)    Other Reaction: mental status alerted  . Sulfa Antibiotics Hives       Review of Systems:  Constitutional: No recent illness, no fever or chills, + fatigue  HEENT: No  headache, no vision change  Cardiac: No  chest pain, No  pressure, No palpitations  Respiratory:  No  shortness of breath. No  Cough  Gastrointestinal: No  abdominal pain, no change on bowel habits  Musculoskeletal: No new myalgia/arthralgia  Skin: +Rash  Hem/Onc: No  easy bruising/bleeding, No  abnormal lumps/bumps  Neurologic: No  weakness, No  Dizziness  Psychiatric: No  concerns with depression, No  concerns with anxiety   Depression screen [facility] 2/9 09/03/2018 03/06/2018  Decreased Interest 2 1  Down, Depressed, Hopeless 1 0  PHQ - 2 Score 3 1  Altered sleeping 2 2  Tired, decreased energy 2 1  Change in appetite 1 2  Feeling bad or failure about yourself  1 0  Trouble concentrating 1 1  Moving slowly or fidgety/restless 1 1  Suicidal thoughts 0 0  PHQ-9 Score 11 8  Difficult doing work/chores Very difficult -  Some encounter information is confidential and  restricted. Go to Review Flowsheets activity to see all data.    Exam:  BP 134/76 (BP Location: Left Arm, Patient Position: Sitting, Cuff Size: Large)   Pulse 63   Temp 98.2 F (36.8 C) (Oral)   Wt 229 lb 3.2 oz (104 kg)   BMI 35.90 kg/m   Constitutional: VS see above. General Appearance: alert, well-developed, well-nourished, NAD  Eyes: Normal lids and conjunctive, non-icteric sclera  Ears, Nose, Mouth, Throat: MMM, Normal external inspection ears/nares/mouth/lips/gums.  Neck: No masses, trachea midline.   Respiratory: Normal respiratory effort. no wheeze, no rhonchi, no rales  Cardiovascular: S1/S2 normal, no murmur, no rub/gallop auscultated. RRR.   Musculoskeletal: Gait normal. Symmetric and independent movement of all extremities  Abdominal: non-tender, non-distended, no appreciable organomegaly, neg Murphy's, BS WNLx4  Neurological: Normal balance/coordination. No tremor.  Spurling sign negative bilaterally, positive Phalen's test on the right.   Skin: warm, dry, intact.  Very faint maculopapular rash on right wrist.  Psychiatric: Normal judgment/insight. Normal  mood and affect. Oriented x3.       Visit summary with medication list and pertinent instructions was printed for patient to review, patient was advised to alert [place] if any updates are needed. All questions at time of visit were answered - patient instructed to contact office with any additional concerns. ER/RTC precautions were reviewed with the patient and understanding verbalized.   Note: Total time spent 40 minutes, greater than 50% of the visit was spent face-to-face counseling and coordinating care for the following: The primary encounter diagnosis was Arthralgia, unspecified joint. Diagnoses of Myalgia, Morning stiffness of joints, Fatigue, unspecified type, and DDD (degenerative disc disease), cervical were also pertinent to this visit.Marland Kitchen  Please note: voice recognition software was used to produce this  document, and typos may escape review. Please contact Dr. Sheppard Coil for any needed clarifications.    Follow up plan: Return for Recheck depending on results - will call!.        From UpToDate: "The diagnosis of rheumatoid arthritis (RA) can be made when the following clinical features are all present: ?Inflammatory arthritis involving three or more joints. (See "Clinical manifestations of rheumatoid arthritis", section on 'Symptoms and physical findings'.) ?Positive rheumatoid factor (RF) and/or anti-citrullinated peptide/protein antibody (such as anti-cyclic citrullinated peptide [CCP])) testing. (See "Biologic markers in the diagnosis and assessment of rheumatoid arthritis", section on 'Anti-citrullinated peptide antibodies'.) ?Elevated levels of C-reactive protein (CRP) or the erythrocyte sedimentation rate (ESR). (See "Biologic markers in the diagnosis and assessment of rheumatoid arthritis", section on 'Erythrocyte sedimentation rate'.) ?Diseases with similar clinical features have been excluded, particularly psoriatic arthritis, acute viral polyarthritis, polyarticular gout or calcium pyrophosphate deposition disease, and systemic lupus erythematosus (SLE). (See 'Differential diagnosis' below.) ?The duration of symptoms is more than six weeks."

## 2018-11-27 NOTE — Patient Instructions (Addendum)
Plan:  Await lab results to see if you are meeting criteria for rheumatoid arthritis, based on your symptoms it sounds like this is certainly a possibility.  Lab results may take some time to come back, should not take more than a week, I will reach out to you as soon as I have results!  If all labs are normal, will discuss other possible conditions which may be causing your symptoms.   He should also hear back about scheduling the MRI for the neck, please let us know if you do not hear anything.

## 2018-11-28 LAB — COMPLETE METABOLIC PANEL WITH GFR
AG Ratio: 1.3 (calc) (ref 1.0–2.5)
ALBUMIN MSPROF: 4.1 g/dL (ref 3.6–5.1)
ALT: 64 U/L — ABNORMAL HIGH (ref 6–29)
AST: 45 U/L — ABNORMAL HIGH (ref 10–35)
Alkaline phosphatase (APISO): 87 U/L (ref 37–153)
BUN: 17 mg/dL (ref 7–25)
CO2: 27 mmol/L (ref 20–32)
Calcium: 9.5 mg/dL (ref 8.6–10.4)
Chloride: 108 mmol/L (ref 98–110)
Creat: 0.69 mg/dL (ref 0.50–1.05)
GFR, Est African American: 117 mL/min/{1.73_m2} (ref >=60)
GFR, Est Non African American: 101 mL/min/{1.73_m2} (ref >=60)
Globulin: 3.2 g/dL (calc) (ref 1.9–3.7)
Glucose, Bld: 114 mg/dL — ABNORMAL HIGH (ref 65–99)
POTASSIUM: 3.9 mmol/L (ref 3.5–5.3)
Sodium: 142 mmol/L (ref 135–146)
Total Bilirubin: 0.6 mg/dL (ref 0.2–1.2)
Total Protein: 7.3 g/dL (ref 6.1–8.1)

## 2018-11-28 LAB — LIPID PANEL
Cholesterol: 240 mg/dL — ABNORMAL HIGH (ref <200)
HDL: 45 mg/dL — ABNORMAL LOW (ref >=50)
LDL Cholesterol (Calc): 161 mg/dL (calc) — ABNORMAL HIGH
Non-HDL Cholesterol (Calc): 195 mg/dL (calc) — ABNORMAL HIGH (ref <130)
Total CHOL/HDL Ratio: 5.3 (calc) — ABNORMAL HIGH (ref <5.0)
Triglycerides: 181 mg/dL — ABNORMAL HIGH (ref <150)

## 2018-11-28 LAB — HEMOGLOBIN A1C
Hgb A1c MFr Bld: 6 % of total Hgb — ABNORMAL HIGH (ref <5.7)
Mean Plasma Glucose: 126 (calc)
eAG (mmol/L): 7 (calc)

## 2018-11-28 LAB — TSH: TSH: 1.26 mIU/L

## 2018-11-28 LAB — THYROID PEROXIDASE ANTIBODY: Thyroperoxidase Ab SerPl-aCnc: 2 IU/mL (ref <9)

## 2018-11-28 LAB — T4, FREE: Free T4: 1 ng/dL (ref 0.8–1.8)

## 2018-11-28 NOTE — Telephone Encounter (Signed)
error 

## 2018-11-30 ENCOUNTER — Encounter: Payer: Self-pay | Admitting: Osteopathic Medicine

## 2018-11-30 LAB — HIGH SENSITIVITY CRP: hs-CRP: 7.4 mg/L — ABNORMAL HIGH

## 2018-11-30 LAB — SEDIMENTATION RATE: Sed Rate: 22 mm/h (ref 0–30)

## 2018-11-30 LAB — ANTI-SMITH ANTIBODY: ENA SM Ab Ser-aCnc: <1 AI

## 2018-11-30 LAB — CYCLIC CITRUL PEPTIDE ANTIBODY, IGG: Cyclic Citrullin Peptide Ab: <16 UNITS

## 2018-11-30 LAB — ANA: Anti Nuclear Antibody (ANA): NEGATIVE

## 2018-11-30 LAB — ANTI-DNA ANTIBODY, DOUBLE-STRANDED: ds DNA Ab: <1 IU/mL

## 2018-11-30 LAB — RHEUMATOID FACTOR: Rheumatoid fact SerPl-aCnc: <14 IU/mL (ref <14)

## 2018-11-30 NOTE — Telephone Encounter (Signed)
Called Pt - she would like to discuss lab results and recommendations with PCP. Scheduled for telephone visit on Monday.

## 2018-12-03 ENCOUNTER — Encounter: Payer: Self-pay | Admitting: Osteopathic Medicine

## 2018-12-03 ENCOUNTER — Ambulatory Visit (INDEPENDENT_AMBULATORY_CARE_PROVIDER_SITE_OTHER): Payer: No Typology Code available for payment source | Admitting: Osteopathic Medicine

## 2018-12-03 DIAGNOSIS — M2550 Pain in unspecified joint: Secondary | ICD-10-CM | POA: Diagnosis not present

## 2018-12-03 MED ORDER — MELOXICAM 15 MG PO TABS: 15.0000 mg | ORAL_TABLET | Freq: Every day | ORAL | 1 refills | Status: DC

## 2018-12-03 MED FILL — MELOXICAM 15 MG TABLET: 15 | 90 days supply | Qty: 90 | Fill #0

## 2018-12-03 NOTE — Progress Notes (Signed)
Virtual Visit via Video (App used: Doximity) Note  I connected with      Melanie [name] on 12/03/18 at 11:00 by a telemedicine application and verified that I am speaking with the correct person using two identifiers.  Patient is at home I am working from home    I discussed the limitations of evaluation and management by telemedicine and the availability of in person appointments. The patient expressed understanding and agreed to proceed.  History of Present Illness: Melanie [name] is a 53 y.o. female who would like to discuss  Chief Complaint  Patient presents with  . Follow-up    Lab results and medication    Chronic joint and back pain. Morning stiffness especially hands.  Knee and shoulder pain.  Tingling in arms especially at nighttime. Occasional muscle cramps.   Previous pain treatments tried?  NSAID: Ibuprofen inconsistent use, helps occasionally TCA: Amitriptyline or Trazodone No  SSRI/SNRI: Duloxetine No  Muscle Relaxer: Cyclobenzaprine No  Anticonvulsants: Gabapentin No , Pregabalin No  physical activity active job, minimal formal exercise   Previous rheumatologists:  One in [place] and one in [place]   The 10-year ASCVD risk score Mikey Bussing DC Brooke Bonito., et al., 2013) is: 3.2%   Values used to calculate the score:     Age: 23 years     Sex: Female     Is Non-Hispanic African American: No     Diabetic: No     Tobacco smoker: No     Systolic Blood Pressure: 818 mmHg     Is BP treated: Yes     HDL Cholesterol: 45 mg/dL     Total Cholesterol: 240 mg/dL   Observations/Objective: There were no vitals taken for this visit. BP Readings from Last 3 Encounters:  11/27/18 134/76  09/24/18 139/84  09/03/18 135/71   Exam: Normal Speech.   Lab and Radiology Results No results found for this or any previous visit (from the past 72 hour(s)). No results found.  Recent Results (from the past 2160 hour(s))  Sedimentation rate     Status: None   Collection Time: 11/27/18  1:38 PM  Result Value Ref Range   Sed Rate 22 0 - 30 mm/h  High sensitivity CRP     Status: Abnormal   Collection Time: 11/27/18  1:38 PM  Result Value Ref Range   hs-CRP 7.4 (H) mg/L    Comment: Reference Range Optimal <1.0 Jellinger PS et al. [name].2017;23(Suppl 2):1-87. . For ages >78 Years: hs-CRP mg/L  Risk According to AHA/CDC Guidelines <1.0         Lower relative cardiovascular risk. 1.0-3.0      Average relative cardiovascular risk. 3.1-10.0     Higher relative cardiovascular risk.              Consider retesting in 1 to 2 weeks to              exclude a benign transient elevation              in the baseline CRP value secondary              to infection or inflammation. >10.0        Persistent elevation, upon retesting,              may be associated with infection and              inflammation. .   ANA     Status:  None   Collection Time: 11/27/18  1:38 PM  Result Value Ref Range   Anti Nuclear Antibody (ANA) NEGATIVE NEGATIVE    Comment: ANA IFA is a first line screen for detecting the presence of up to approximately 150 autoantibodies in various autoimmune diseases. A negative ANA IFA result suggests an ANA-associated autoimmune disease is not present at this time, but is not definitive. If there is high clinical suspicion for Sjogren's syndrome, testing for anti-SS-A/Ro antibody should be considered. Anti-Jo-1 antibody should be considered for clinically suspected inflammatory myopathies. . AC-0: Negative . International Consensus on ANA Patterns (https://www.hernandez-brewer.com/) . For additional information, please refer to http://education.[facility].com/faq/FAQ177 (This link is being provided for informational/ educational purposes only.) .   Rheumatoid factor     Status: None   Collection Time: 11/27/18  1:38 PM  Result Value Ref Range   Rhuematoid fact SerPl-aCnc <29 <93 IU/mL  Cyclic citrul peptide  antibody, IgG     Status: None   Collection Time: 11/27/18  1:38 PM  Result Value Ref Range   Cyclic Citrullin Peptide Ab <16 UNITS    Comment: Reference Range Negative:            <20 Weak Positive:       20-39 Moderate Positive:   40-59 Strong Positive:     >59 .   Anti-Smith antibody     Status: None   Collection Time: 11/27/18  1:38 PM  Result Value Ref Range   ENA SM Ab Ser-aCnc <1.0 NEG <1.0 NEG AI  Anti-DNA antibody, double-stranded     Status: None   Collection Time: 11/27/18  1:38 PM  Result Value Ref Range   ds DNA Ab <1 IU/mL    Comment:                            IU/mL       Interpretation                            < or = 4    Negative                            5-9         Indeterminate                            > or = 10   Positive .   COMPLETE METABOLIC PANEL WITH GFR     Status: Abnormal   Collection Time: 11/27/18  1:40 PM  Result Value Ref Range   Glucose, Bld 114 (H) 65 - 99 mg/dL    Comment: .            Fasting reference interval . For someone without known diabetes, a glucose value between 100 and 125 mg/dL is consistent with prediabetes and should be confirmed with a follow-up test. .    BUN 17 7 - 25 mg/dL   Creat 0.69 0.50 - 1.05 mg/dL    Comment: For patients >33 years of age, the reference limit for Creatinine is approximately 13% higher for people identified as African-American. .    GFR, Est Non African American 101 > OR = 60 mL/min/1.63m   GFR, Est African American 117 > OR = 60 mL/min/1.728m  BUN/Creatinine Ratio NOT APPLICABLE 6 - 22 (calc)  Sodium 142 135 - 146 mmol/L   Potassium 3.9 3.5 - 5.3 mmol/L   Chloride 108 98 - 110 mmol/L   CO2 27 20 - 32 mmol/L   Calcium 9.5 8.6 - 10.4 mg/dL   Total Protein 7.3 6.1 - 8.1 g/dL   Albumin 4.1 3.6 - 5.1 g/dL   Globulin 3.2 1.9 - 3.7 g/dL (calc)   AG Ratio 1.3 1.0 - 2.5 (calc)   Total Bilirubin 0.6 0.2 - 1.2 mg/dL   Alkaline phosphatase (APISO) 87 37 - 153 U/L   AST 45 (H) 10 - 35  U/L   ALT 64 (H) 6 - 29 U/L  Hemoglobin A1c     Status: Abnormal   Collection Time: 11/27/18  1:40 PM  Result Value Ref Range   Hgb A1c MFr Bld 6.0 (H) <5.7 % of total Hgb    Comment: For someone without known diabetes, a hemoglobin  A1c value between 5.7% and 6.4% is consistent with prediabetes and should be confirmed with a  follow-up test. . For someone with known diabetes, a value <7% indicates that their diabetes is well controlled. A1c targets should be individualized based on duration of diabetes, age, comorbid conditions, and other considerations. . This assay result is consistent with an increased risk of diabetes. . Currently, no consensus exists regarding use of hemoglobin A1c for diagnosis of diabetes for children. .    Mean Plasma Glucose 126 (calc)   eAG (mmol/L) 7.0 (calc)  TSH     Status: None   Collection Time: 11/27/18  1:40 PM  Result Value Ref Range   TSH 1.26 mIU/L    Comment:           Reference Range .           > or = 20 Years  0.40-4.50 .                Pregnancy Ranges           First trimester    0.26-2.66           Second trimester   0.55-2.73           Third trimester    0.43-2.91   T4, free     Status: None   Collection Time: 11/27/18  1:40 PM  Result Value Ref Range   Free T4 1.0 0.8 - 1.8 ng/dL  Thyroid peroxidase antibody     Status: None   Collection Time: 11/27/18  1:40 PM  Result Value Ref Range   Thyroperoxidase Ab SerPl-aCnc 2 <9 IU/mL  Lipid panel     Status: Abnormal   Collection Time: 11/27/18  1:40 PM  Result Value Ref Range   Cholesterol 240 (H) <200 mg/dL   HDL 45 (L) > OR = 50 mg/dL   Triglycerides 181 (H) <150 mg/dL   LDL Cholesterol (Calc) 161 (H) mg/dL (calc)    Comment: Reference range: <100 . Desirable range <100 mg/dL for primary prevention;   <70 mg/dL for patients with CHD or diabetic patients  with > or = 2 CHD risk factors. Marland Kitchen LDL-C is now calculated using the Martin-Hopkins  calculation, which is a  validated novel method providing  better accuracy than the Friedewald equation in the  estimation of LDL-C.  Cresenciano Genre et al. Annamaria Helling. 4098;119(14): 2061-2068  (http://education.[facility].com/faq/FAQ164)    Total CHOL/HDL Ratio 5.3 (H) <5.0 (calc)   Non-HDL Cholesterol (Calc) 195 (H) <130 mg/dL (calc)    Comment: For patients with diabetes plus  1 major ASCVD risk  factor, treating to a non-HDL-C goal of <100 mg/dL  (LDL-C of <70 mg/dL) is considered a therapeutic  option.        Assessment and Plan: 53 y.o. female with The encounter diagnosis was Arthralgia, unspecified joint. Concern for possible seronegative RA, would consider trial MTX but let's try NSAID first, possible gabapentin or Lyrica, switch Lexapro to Cymbalta. Let's get a second opinion from Rheum re: seronegative RA and med management considerations.    PDMP not reviewed this encounter. Orders Placed This Encounter  Procedures  . Ambulatory referral to Rheumatology    Referral Priority:   Routine    Referral Type:   Consultation    Referral Reason:   Specialty Services Required    Referred to Provider:   Hennie Duos, MD    Requested Specialty:   Rheumatology    Number of Visits Requested:   1   Meds ordered this encounter  Medications  . meloxicam (MOBIC) 15 MG tablet    Sig: Take 1 tablet (15 mg total) by mouth daily.    Dispense:  90 tablet    Refill:  1   There are no Patient Instructions on file for this visit.  Instructions sent via MyChart. If MyChart not available, pt was given option for info via personal e-mail w/ no guarantee of protected health info over unsecured e-mail communication, and MyChart sign-up instructions were included.   Follow Up Instructions: Return if symptoms worsen or fail to improve let me know sooner, for recheck on meloxicam in 1-2 weeks.    I discussed the assessment and treatment plan with the patient. The patient was provided an opportunity to ask questions and  all were answered. The patient agreed with the plan and demonstrated an understanding of the instructions.   The patient was advised to call back or seek an in-person evaluation if any new concerns, if symptoms worsen or if the condition fails to improve as anticipated.  25 minutes of non-face-to-face time was provided during this encounter.                      Historical information moved to improve visibility of documentation.  History reviewed. No pertinent past medical history. Past Surgical History:  Procedure Laterality Date  . ABDOMINAL HYSTERECTOMY    . MYOMECTOMY     Social History   Tobacco Use  . Smoking status: Never Smoker  . Smokeless tobacco: Never Used  Substance Use Topics  . Alcohol use: Yes    Alcohol/week: 1.0 standard drinks    Types: 1 Cans of beer per week    Comment: occ   family history includes Diabetes in her mother; Lung cancer in her father; Skin cancer in her mother.  Medications: Current Outpatient Medications  Medication Sig Dispense Refill  . AMBULATORY NON FORMULARY MEDICATION Continuous positive airway pressure (CPAP) machine auto-titrate to max 20 cm of H2O pressure, with all supplemental supplies as needed. 1 each 0  . amphetamine-dextroamphetamine (ADDERALL XR) 15 MG 24 hr capsule Take 1 capsule by mouth daily. 30 capsule 0  . aspirin EC 81 MG tablet Take 81 mg by mouth daily.    Marland Kitchen azelastine (OPTIVAR) 0.05 % ophthalmic solution Place 2 drops into both eyes 2 (two) times daily. 6 mL 11  . cholecalciferol (VITAMIN D3) 25 MCG (1000 UT) tablet Take 2,000 Units by mouth daily.    Marland Kitchen escitalopram (LEXAPRO) 10 MG tablet Take 1.5 tablets (15 mg  total) by mouth daily. 45 tablet 1  . fluticasone (FLONASE) 50 MCG/ACT nasal spray One spray in each nostril twice a day, use left hand for right nostril, and right hand for left nostril. 48 g 3  . hydrochlorothiazide (HYDRODIURIL) 25 MG tablet Take 1 tablet (25 mg total) by mouth daily. 90  tablet 3  . levocetirizine (XYZAL) 5 MG tablet Take 1 tablet (5 mg total) by mouth every evening. 90 tablet 1  . Omega-3 Fatty Acids (FISH OIL) 1000 MG CAPS Take 1,400 mg by mouth.    . vitamin B-12 (CYANOCOBALAMIN) 500 MCG tablet Take 2,500 mcg by mouth daily.    . meloxicam (MOBIC) 15 MG tablet Take 1 tablet (15 mg total) by mouth daily. 90 tablet 1   No current facility-administered medications for this visit.    Allergies  Allergen Reactions  . Clarithromycin Other (See Comments) and Rash    Other Reaction: Fever  . Morphine Other (See Comments)    Other Reaction: mental status alerted  . Sulfa Antibiotics Hives    PDMP not reviewed this encounter. Orders Placed This Encounter  Procedures  . Ambulatory referral to Rheumatology    Referral Priority:   Routine    Referral Type:   Consultation    Referral Reason:   Specialty Services Required    Referred to Provider:   Hennie Duos, MD    Requested Specialty:   Rheumatology    Number of Visits Requested:   1   Meds ordered this encounter  Medications  . meloxicam (MOBIC) 15 MG tablet    Sig: Take 1 tablet (15 mg total) by mouth daily.    Dispense:  90 tablet    Refill:  1

## 2018-12-06 MED FILL — ESCITALOPRAM 10 MG TABLET: 10 | 30 days supply | Qty: 45 | Fill #1

## 2018-12-12 ENCOUNTER — Telehealth (INDEPENDENT_AMBULATORY_CARE_PROVIDER_SITE_OTHER): Payer: No Typology Code available for payment source

## 2018-12-12 DIAGNOSIS — M549 Dorsalgia, unspecified: Secondary | ICD-10-CM

## 2018-12-12 DIAGNOSIS — G8929 Other chronic pain: Secondary | ICD-10-CM

## 2018-12-12 DIAGNOSIS — M25551 Pain in right hip: Secondary | ICD-10-CM

## 2018-12-12 NOTE — Telephone Encounter (Signed)
Pt is requesting a referral for x-ray to do before MRI. As per pt, would like to to check thoracic, lumbar and hip areas. Current order for MRI is for cervical spine check. She states that meloxicam rx is helping, but today is one of her worst pains. Pls advise, thanks.

## 2018-12-14 NOTE — Telephone Encounter (Signed)
Orders are in  15 minutes spent (reviewing chart, placing orders, reviewing results when they come in)

## 2018-12-14 NOTE — Telephone Encounter (Signed)
Left a detailed vm msg for pt regarding referral request. Direct call back info provided.

## 2018-12-17 ENCOUNTER — Ambulatory Visit (INDEPENDENT_AMBULATORY_CARE_PROVIDER_SITE_OTHER): Payer: No Typology Code available for payment source

## 2018-12-17 ENCOUNTER — Other Ambulatory Visit: Payer: Self-pay

## 2018-12-17 DIAGNOSIS — M25552 Pain in left hip: Secondary | ICD-10-CM | POA: Diagnosis not present

## 2018-12-17 DIAGNOSIS — M549 Dorsalgia, unspecified: Secondary | ICD-10-CM | POA: Diagnosis not present

## 2018-12-17 DIAGNOSIS — M5030 Other cervical disc degeneration, unspecified cervical region: Secondary | ICD-10-CM

## 2018-12-17 DIAGNOSIS — G8929 Other chronic pain: Secondary | ICD-10-CM | POA: Diagnosis not present

## 2018-12-17 DIAGNOSIS — M25551 Pain in right hip: Secondary | ICD-10-CM | POA: Diagnosis not present

## 2018-12-18 ENCOUNTER — Encounter: Payer: Self-pay | Admitting: Osteopathic Medicine

## 2018-12-18 ENCOUNTER — Ambulatory Visit (INDEPENDENT_AMBULATORY_CARE_PROVIDER_SITE_OTHER): Payer: No Typology Code available for payment source | Admitting: Osteopathic Medicine

## 2018-12-18 DIAGNOSIS — M2560 Stiffness of unspecified joint, not elsewhere classified: Secondary | ICD-10-CM

## 2018-12-18 DIAGNOSIS — G5601 Carpal tunnel syndrome, right upper limb: Secondary | ICD-10-CM | POA: Diagnosis not present

## 2018-12-18 DIAGNOSIS — M5030 Other cervical disc degeneration, unspecified cervical region: Secondary | ICD-10-CM

## 2018-12-18 DIAGNOSIS — M2550 Pain in unspecified joint: Secondary | ICD-10-CM

## 2018-12-18 DIAGNOSIS — M419 Scoliosis, unspecified: Secondary | ICD-10-CM

## 2018-12-18 NOTE — Patient Instructions (Addendum)
1. Try splint for wrist/hand pain, suspect carpal tunnel, consider EMG testing / possible surgery.  2. Continue Meloxicam, consider starting antacid for GI protection  3. Will send referral for injection of c-spine arthritis   4. Will look into what the hold-up with rheumatology might be, please call them directly if you haven't heard back in 1-2 days.  5. Discuss scoliosis w/ your chiropractor, this might change their approach to your back pain.

## 2018-12-18 NOTE — Progress Notes (Signed)
Virtual Visit via  Video (App used: Doximity) Note  I connected with      Melanie [name] on 12/18/18 at 1:00 PM by a telemedicine application and verified that I am speaking with the correct person using two identifiers.  Patient is at home I am in office    I discussed the limitations of evaluation and management by telemedicine and the availability of in person appointments. The patient expressed understanding and agreed to proceed.  History of Present Illness: Melanie [name] is a 53 y.o. female who would like to discuss  Chief Complaint  Patient presents with  . Follow-up    Arthritis/Pain    Main symptoms of concern include:  Chronic joint and back pain.  Morning stiffness especially hands.   Knee and shoulder pain.   Tingling in arms especially at nighttime.  Occasional muscle cramps.   Numbness/tingling in R hand, 1st/2nd/3rd digits, worse w/ writing.    Previous pain treatments tried?  NSAID: Ibuprofen inconsistent use, helps occasionally. Mobic which was started last visit has definitely improved pain but still having stiffness and soreness.  TCA: Amitriptyline or Trazodone No  SSRI/SNRI: Duloxetine No  Muscle Relaxer: Cyclobenzaprine No  Anticonvulsants: Gabapentin No , Pregabalin No  physical activity active job, minimal formal exercise   Previous rheumatologists:  One in [place] and one in [place] - pt cannot recall the exact office, rheumatology locally requested that we get records from previous specialists but we have obviously not been able to do this. THis was years ago and she cannot recall where she went.          Observations/Objective: There were no vitals taken for this visit. BP Readings from Last 3 Encounters:  11/27/18 134/76  09/24/18 139/84  09/03/18 135/71   Exam: Normal Speech.    Lab and Radiology Results No results found for this or any previous visit (from the past 72 hour(s)). Dg Thoracic Spine  W/swimmers  Result Date: 12/18/2018 CLINICAL DATA:  Chronic thoracic and lumbar spine pain. No known injury. EXAM: THORACIC SPINE - 3 VIEWS COMPARISON:  None. FINDINGS: There is no evidence of thoracic spine fracture. Alignment is normal. Mild convex right thoracic scoliosis noted. No other significant bone abnormalities are identified. IMPRESSION: Mild convex right thoracic scoliosis.  Otherwise negative. Electronically Signed   By: Inge Rise [name]   On: 12/18/2018 10:01   Dg Lumbar Spine Complete  Result Date: 12/18/2018 CLINICAL DATA:  Low back pain EXAM: LUMBAR SPINE - COMPLETE 4+ VIEW COMPARISON:  None. FINDINGS: Mild levoscoliosis at L3-4. Normal sagittal alignment. Negative for fracture or pars defect. Mild disc degeneration throughout the lumbar spine most notably L1-2 and L2-3. SI joints intact. IMPRESSION: Scoliosis and mild disc degeneration lumbar spine. No acute abnormality. Electronically Signed   By: Franchot Gallo [name]   On: 12/18/2018 10:09   Mr Cervical Spine Wo Contrast  Result Date: 12/18/2018 CLINICAL DATA:  Cervical disc degeneration.  Neck pain EXAM: MRI CERVICAL SPINE WITHOUT CONTRAST TECHNIQUE: Multiplanar, multisequence MR imaging of the cervical spine was performed. No intravenous contrast was administered. COMPARISON:  None. FINDINGS: Alignment: Mild anterolisthesis C3-4 and C4-5. Mild retrolisthesis C5-6 and C6-7 with straightening of the cervical lordosis. Vertebrae: Negative for fracture or mass.  Normal bone marrow Cord: Normal signal and morphology of the spinal cord Posterior Fossa, vertebral arteries, paraspinal tissues: Negative Disc levels: C2-3: Negative C3-4: Small central disc protrusion without significant stenosis. C4-5: Small central disc protrusion.  Negative for stenosis.  C5-6: Disc degeneration and diffuse uncinate spurring. Moderate foraminal narrowing on the right and mild foraminal narrowing on the left due to spurring. Mild spinal stenosis. C6-7: Disc  degeneration and spondylosis with diffuse uncinate spurring. Moderate right foraminal encroachment and mild left foraminal encroachment due to spurring C7-T1: Negative IMPRESSION: Multilevel degenerative change in the cervical spine as above. Moderate right foraminal encroachment C5-6 and C6-7 due to spurring. Mild left foraminal encroachment C5-6 and C6-7 due to spurring. Electronically Signed   By: Franchot Gallo [name]   On: 12/18/2018 10:06   Dg Hip Unilat W Or W/o Pelvis 2-3 Views Left  Result Date: 12/18/2018 CLINICAL DATA:  Chronic BILATERAL back pain, BILATERAL hip pain EXAM: DG HIP (WITH OR WITHOUT PELVIS) 2-3V LEFT COMPARISON:  None FINDINGS: Osseous mineralization normal. Hip and SI joint spaces preserved. No acute fracture, dislocation, or bone destruction. IMPRESSION: Normal exam. Electronically Signed   By: Lavonia Dana [name]   On: 12/18/2018 09:47   Dg Hip Unilat W Or W/o Pelvis 2-3 Views Right  Result Date: 12/18/2018 CLINICAL DATA:  Chronic BILATERAL back pain, BILATERAL hip pain EXAM: DG HIP (WITH OR WITHOUT PELVIS) 2-3V RIGHT COMPARISON:  None FINDINGS: Osseous mineralization normal. Hip and SI joint spaces preserved. No fracture, dislocation, or bone destruction. IMPRESSION: Normal exam. Electronically Signed   By: Lavonia Dana [name]   On: 12/18/2018 09:47       Assessment and Plan: 53 y.o. female with The primary encounter diagnosis was DDD (degenerative disc disease), cervical. Diagnoses of Carpal tunnel syndrome of right wrist, Arthralgia, unspecified joint, Morning stiffness of joints, and Scoliosis of thoracolumbar spine, unspecified scoliosis type were also pertinent to this visit.  The scoliosis diagnosis is a bit of a surprise to the patient.  No previous history of this but it is quite obvious on the imaging, which I reviewed personally and was shared with the patient.  Basically, from my understanding of the diagnostic criteria, she may meet criteria for seronegative  rheumatoid arthritis, so would like assistance from rheumatology regarding definitive diagnosis and medication management if she needs DMARDS   Patient Instructions  1. Try splint for wrist/hand pain, suspect carpal tunnel, consider EMG testing / possible surgery.  2. Continue Meloxicam, consider starting antacid for GI protection  3. Will send referral for injection of c-spine arthritis   4. Will look into what the hold-up with rheumatology might be, please call them directly if you haven't heard back in 1-2 days.  5. Discuss scoliosis w/ your chiropractor, this might change their approach to your back pain.     Instructions sent via MyChart. If MyChart not available, pt was given option for info via personal e-mail w/ no guarantee of protected health info over unsecured e-mail communication, and MyChart sign-up instructions were included.   Follow Up Instructions: Return for recheck after rheumatology visit .    I discussed the assessment and treatment plan with the patient. The patient was provided an opportunity to ask questions and all were answered. The patient agreed with the plan and demonstrated an understanding of the instructions.   The patient was advised to call back or seek an in-person evaluation if any new concerns, if symptoms worsen or if the condition fails to improve as anticipated.  40 minutes of non-face-to-face time was provided during this encounter.                      Historical information moved to improve visibility of documentation.  No past medical history on file. Past Surgical History:  Procedure Laterality Date  . ABDOMINAL HYSTERECTOMY    . MYOMECTOMY     Social History   Tobacco Use  . Smoking status: Never Smoker  . Smokeless tobacco: Never Used  Substance Use Topics  . Alcohol use: Yes    Alcohol/week: 1.0 standard drinks    Types: 1 Cans of beer per week    Comment: occ   family history includes Diabetes in her  mother; Lung cancer in her father; Skin cancer in her mother.  Medications: Current Outpatient Medications  Medication Sig Dispense Refill  . AMBULATORY NON FORMULARY MEDICATION Continuous positive airway pressure (CPAP) machine auto-titrate to max 20 cm of H2O pressure, with all supplemental supplies as needed. 1 each 0  . amphetamine-dextroamphetamine (ADDERALL XR) 15 MG 24 hr capsule Take 1 capsule by mouth daily. 30 capsule 0  . aspirin EC 81 MG tablet Take 81 mg by mouth daily.    Marland Kitchen azelastine (OPTIVAR) 0.05 % ophthalmic solution Place 2 drops into both eyes 2 (two) times daily. 6 mL 11  . cholecalciferol (VITAMIN D3) 25 MCG (1000 UT) tablet Take 2,000 Units by mouth daily.    Marland Kitchen escitalopram (LEXAPRO) 10 MG tablet Take 1.5 tablets (15 mg total) by mouth daily. 45 tablet 1  . fluticasone (FLONASE) 50 MCG/ACT nasal spray One spray in each nostril twice a day, use left hand for right nostril, and right hand for left nostril. 48 g 3  . hydrochlorothiazide (HYDRODIURIL) 25 MG tablet Take 1 tablet (25 mg total) by mouth daily. 90 tablet 3  . levocetirizine (XYZAL) 5 MG tablet Take 1 tablet (5 mg total) by mouth every evening. 90 tablet 1  . meloxicam (MOBIC) 15 MG tablet Take 1 tablet (15 mg total) by mouth daily. 90 tablet 1  . Omega-3 Fatty Acids (FISH OIL) 1000 MG CAPS Take 1,400 mg by mouth.    . vitamin B-12 (CYANOCOBALAMIN) 500 MCG tablet Take 2,500 mcg by mouth daily.     No current facility-administered medications for this visit.    Allergies  Allergen Reactions  . Clarithromycin Other (See Comments) and Rash    Other Reaction: Fever  . Morphine Other (See Comments)    Other Reaction: mental status alerted  . Sulfa Antibiotics Hives    PDMP not reviewed this encounter. No orders of the defined types were placed in this encounter.  No orders of the defined types were placed in this encounter.

## 2018-12-19 DIAGNOSIS — M5136 Other intervertebral disc degeneration, lumbar region: Secondary | ICD-10-CM | POA: Insufficient documentation

## 2019-01-03 ENCOUNTER — Emergency Department (HOSPITAL_COMMUNITY)
Admission: EM | Admit: 2019-01-03 | Discharge: 2019-01-03 | Disposition: A | Discharge status: Home / Self Care | Payer: No Typology Code available for payment source | Attending: Emergency Medicine | Admitting: Emergency Medicine

## 2019-01-03 ENCOUNTER — Encounter (HOSPITAL_COMMUNITY): Payer: Self-pay

## 2019-01-03 ENCOUNTER — Emergency Department (HOSPITAL_COMMUNITY): Payer: No Typology Code available for payment source

## 2019-01-03 DIAGNOSIS — Z79899 Other long term (current) drug therapy: Secondary | ICD-10-CM | POA: Diagnosis not present

## 2019-01-03 DIAGNOSIS — R51 Headache: Secondary | ICD-10-CM | POA: Diagnosis not present

## 2019-01-03 DIAGNOSIS — E039 Hypothyroidism, unspecified: Secondary | ICD-10-CM | POA: Diagnosis not present

## 2019-01-03 DIAGNOSIS — I10 Essential (primary) hypertension: Secondary | ICD-10-CM | POA: Diagnosis not present

## 2019-01-03 DIAGNOSIS — R0602 Shortness of breath: Secondary | ICD-10-CM | POA: Diagnosis not present

## 2019-01-03 DIAGNOSIS — Z7982 Long term (current) use of aspirin: Secondary | ICD-10-CM | POA: Diagnosis not present

## 2019-01-03 DIAGNOSIS — R0789 Other chest pain: Secondary | ICD-10-CM | POA: Insufficient documentation

## 2019-01-03 LAB — TROPONIN I (HIGH SENSITIVITY)
Troponin I (High Sensitivity): <2 ng/L (ref <18)
Troponin I (High Sensitivity): 2 ng/L (ref <18)

## 2019-01-03 LAB — D-DIMER, QUANTITATIVE: D-Dimer, Quant: 0.33 ug/mL-FEU (ref 0.00–0.50)

## 2019-01-03 LAB — COMPREHENSIVE METABOLIC PANEL
ALT: 86 U/L — ABNORMAL HIGH (ref 0–44)
AST: 59 U/L — ABNORMAL HIGH (ref 15–41)
Albumin: 4 g/dL (ref 3.5–5.0)
Alkaline Phosphatase: 84 U/L (ref 38–126)
Anion gap: 11 (ref 5–15)
BUN: 12 mg/dL (ref 6–20)
CO2: 25 mmol/L (ref 22–32)
Calcium: 9.2 mg/dL (ref 8.9–10.3)
Chloride: 101 mmol/L (ref 98–111)
Creatinine, Ser: 0.77 mg/dL (ref 0.44–1.00)
GFR calc Af Amer: >60 mL/min (ref >60)
GFR calc non Af Amer: >60 mL/min (ref >60)
Glucose, Bld: 96 mg/dL (ref 70–99)
Potassium: 3.5 mmol/L (ref 3.5–5.1)
Sodium: 137 mmol/L (ref 135–145)
Total Bilirubin: 1.1 mg/dL (ref 0.3–1.2)
Total Protein: 7.1 g/dL (ref 6.5–8.1)

## 2019-01-03 LAB — CBC WITH DIFFERENTIAL/PLATELET
Abs Immature Granulocytes: 0.02 10*3/uL (ref 0.00–0.07)
Basophils Absolute: 0.1 10*3/uL (ref 0.0–0.1)
Basophils Relative: 1 %
Eosinophils Absolute: 0.2 10*3/uL (ref 0.0–0.5)
Eosinophils Relative: 3 %
HCT: 41.5 % (ref 36.0–46.0)
Hemoglobin: 14.2 g/dL (ref 12.0–15.0)
Immature Granulocytes: 0 %
Lymphocytes Relative: 33 %
Lymphs Abs: 2.2 10*3/uL (ref 0.7–4.0)
MCH: 30.7 pg (ref 26.0–34.0)
MCHC: 34.2 g/dL (ref 30.0–36.0)
MCV: 89.8 fL (ref 80.0–100.0)
Monocytes Absolute: 0.8 10*3/uL (ref 0.1–1.0)
Monocytes Relative: 11 %
Neutro Abs: 3.4 10*3/uL (ref 1.7–7.7)
Neutrophils Relative %: 52 %
Platelets: 317 10*3/uL (ref 150–400)
RBC: 4.62 MIL/uL (ref 3.87–5.11)
RDW: 11.5 % (ref 11.5–15.5)
WBC: 6.7 10*3/uL (ref 4.0–10.5)
nRBC: 0 % (ref 0.0–0.2)

## 2019-01-03 LAB — LIPASE, BLOOD: Lipase: 95 U/L — ABNORMAL HIGH (ref 11–51)

## 2019-01-03 MED ORDER — OMEPRAZOLE 20 MG PO CPDR: 20.0000 mg | DELAYED_RELEASE_CAPSULE | Freq: Every day | ORAL | 0 refills | Status: DC

## 2019-01-03 MED ORDER — PANTOPRAZOLE SODIUM 40 MG IV SOLR
40.0000 mg | Freq: Once | INTRAVENOUS | Status: AC
  Administered 2019-01-03: 40 mg via INTRAVENOUS
  Filled 2019-01-03: qty 40

## 2019-01-03 MED ORDER — LIDOCAINE VISCOUS HCL 2 % MT SOLN
15.0000 mL | Freq: Once | OROMUCOSAL | Status: AC
  Administered 2019-01-03: 15 mL via ORAL
  Filled 2019-01-03: qty 15

## 2019-01-03 MED ORDER — ALUM & MAG HYDROXIDE-SIMETH 200-200-20 MG/5ML PO SUSP
30.0000 mL | Freq: Once | ORAL | Status: AC
  Administered 2019-01-03: 30 mL via ORAL
  Filled 2019-01-03: qty 30

## 2019-01-03 MED ORDER — SODIUM CHLORIDE 0.9 % IV BOLUS
500.0000 mL | Freq: Once | INTRAVENOUS | Status: AC
  Administered 2019-01-03: 500 mL via INTRAVENOUS

## 2019-01-03 MED ORDER — ACETAMINOPHEN 500 MG PO TABS
1000.0000 mg | ORAL_TABLET | Freq: Once | ORAL | Status: AC
  Administered 2019-01-03: 1000 mg via ORAL
  Filled 2019-01-03: qty 2

## 2019-01-03 NOTE — ED Triage Notes (Signed)
Pt had sudden onset of R sided CP with SOB, radiation to back and, diaphoretic and dizziness while working

## 2019-01-03 NOTE — Discharge Instructions (Signed)
As discussed, your evaluation today has been largely reassuring.  But, it is important that you monitor your condition carefully, and do not hesitate to return to the ED if you develop new, or concerning changes in your condition. ? ?Otherwise, please follow-up with your physician for appropriate ongoing care. ? ?

## 2019-01-03 NOTE — ED Provider Notes (Signed)
[facility] EMERGENCY DEPARTMENT Provider Note   CSN: [id number] Arrival date & time: 01/03/19  1937    History   Chief Complaint Chief Complaint  Patient presents with  . Chest Pain    HPI Melanie [name] is a 53 y.o. female with a past medical history of anxiety, depression, hypothyroid, prediabetes, Karlene Lineman, who presents today for evaluation of chest pain.  She works as a Marine scientist in the emergency room and was seated at her desk when she had sudden onset of right-sided chest pain with radiation to her back.  She became mildly diaphoretic, flushed and felt lightheaded.  This lasted for approximately 10 minutes and was associated with shortness of breath.  No vomiting, or nausea.  She does not take any estrogen/hormones.  No history of DVT/PE.  No recent hemoptysis.    She reports that her pain feels burning in nature.  She reports that she has mild continued pain.         HPI  History reviewed. No pertinent past medical history.  Patient Active Problem List   Diagnosis Date Noted  . Scoliosis of thoracolumbar spine 12/19/2018  . Myalgia 11/27/2018  . Morning stiffness of joints 11/27/2018  . DDD (degenerative disc disease), cervical 11/27/2018  . Swelling of left upper eyelid 09/24/2018  . Attention deficit hyperactivity disorder (ADHD), predominantly inattentive type 02/27/2018  . Arthralgia 02/27/2018  . Screening for malignant neoplasm of colon 02/27/2018  . H/O: hysterectomy 02/27/2018  . History of prediabetes 02/27/2018  . History of hypothyroidism 02/27/2018  . History of depression 02/27/2018  . History of anxiety 02/27/2018  . History of ADHD 02/27/2018  . NASH (nonalcoholic steatohepatitis) 02/27/2018  . GAD (generalized anxiety disorder) 01/06/2017  . Moderate episode of recurrent major depressive disorder ([place]) 01/06/2017  . Anxiety 09/12/2015  . Environmental allergies 09/12/2015  . Essential hypertension 09/12/2015  . OSA on CPAP  09/12/2015    Past Surgical History:  Procedure Laterality Date  . ABDOMINAL HYSTERECTOMY    . MYOMECTOMY       OB History    Gravida  0   Para  0   Term  0   Preterm  0   AB  0   Living  0     SAB  0   TAB  0   Ectopic  0   Multiple  0   Live Births  0            Home Medications    Prior to Admission medications   Medication Sig Start Date End Date Taking? Authorizing Provider  AMBULATORY NON FORMULARY MEDICATION Continuous positive airway pressure (CPAP) machine auto-titrate to max 20 cm of H2O pressure, with all supplemental supplies as needed. 09/21/18   Emeterio Reeve, DO  amphetamine-dextroamphetamine (ADDERALL XR) 15 MG 24 hr capsule Take 1 capsule by mouth daily. 10/01/18 10/01/19  Merian Capron, MD  aspirin EC 81 MG tablet Take 81 mg by mouth daily.    [provider]  azelastine (OPTIVAR) 0.05 % ophthalmic solution Place 2 drops into both eyes 2 (two) times daily. 09/24/18   Silverio Decamp, MD  cholecalciferol (VITAMIN D3) 25 MCG (1000 UT) tablet Take 2,000 Units by mouth daily.    [provider]  escitalopram (LEXAPRO) 10 MG tablet Take 1.5 tablets (15 mg total) by mouth daily. 10/01/18 10/01/19  Merian Capron, MD  fluticasone (FLONASE) 50 MCG/ACT nasal spray One spray in each nostril twice a day, use left hand for  right nostril, and right hand for left nostril. 09/27/18   Silverio Decamp, MD  hydrochlorothiazide (HYDRODIURIL) 25 MG tablet Take 1 tablet (25 mg total) by mouth daily. 09/03/18   Emeterio Reeve, DO  levocetirizine (XYZAL) 5 MG tablet Take 1 tablet (5 mg total) by mouth every evening. 09/27/18   Silverio Decamp, MD  meloxicam (MOBIC) 15 MG tablet Take 1 tablet (15 mg total) by mouth daily. 12/03/18   Emeterio Reeve, DO  Omega-3 Fatty Acids (FISH OIL) 1000 MG CAPS Take 1,400 mg by mouth.    [provider]  vitamin B-12 (CYANOCOBALAMIN) 500 MCG tablet Take 2,500 mcg by mouth daily.     [provider]    Family History Family History  Problem Relation Age of Onset  . Lung cancer Father   . Diabetes Mother   . Skin cancer Mother     Social History Social History   Tobacco Use  . Smoking status: Never Smoker  . Smokeless tobacco: Never Used  Substance Use Topics  . Alcohol use: Yes    Alcohol/week: 1.0 standard drinks    Types: 1 Cans of beer per week    Comment: occ  . Drug use: Never     Allergies   Clarithromycin, Morphine, and Sulfa antibiotics   Review of Systems Review of Systems  Constitutional: Negative for chills and fever.  HENT: Negative for congestion.   Eyes: Negative for visual disturbance.  Respiratory: Positive for chest tightness and shortness of breath (Brief with pain, has resolved. ). Negative for cough.   Cardiovascular: Positive for chest pain. Negative for palpitations and leg swelling.  Gastrointestinal: Positive for nausea. Negative for abdominal pain, diarrhea and vomiting.  Genitourinary: Negative for dysuria and urgency.  Musculoskeletal: Positive for back pain.  Neurological: Positive for light-headedness (During episode) and headaches. Negative for dizziness, tremors and numbness.  Psychiatric/Behavioral: Negative for confusion.  All other systems reviewed and are negative.    Physical Exam Updated Vital Signs BP (!) 131/95   Pulse 68   Temp 97.9 F (36.6 C)   Resp 18   Ht 5' 7"  (1.702 m)   SpO2 96%   BMI 35.90 kg/m   Physical Exam Vitals signs and nursing note reviewed.  Constitutional:      General: She is not in acute distress.    Appearance: She is well-developed. She is not ill-appearing.  HENT:     Head: Normocephalic and atraumatic.  Eyes:     Conjunctiva/sclera: Conjunctivae normal.  Neck:     Musculoskeletal: Normal range of motion and neck supple.     Vascular: No JVD.  Cardiovascular:     Rate and Rhythm: Normal rate and regular rhythm.     Pulses:          Radial pulses are 2+  on the right side and 2+ on the left side.       Posterior tibial pulses are 2+ on the right side and 2+ on the left side.     Heart sounds: Normal heart sounds. Heart sounds not distant. No murmur.  Pulmonary:     Effort: Pulmonary effort is normal. No respiratory distress.     Breath sounds: Normal breath sounds. No decreased breath sounds, wheezing, rhonchi or rales.  Chest:     Chest wall: No tenderness.  Abdominal:     Palpations: Abdomen is soft.     Tenderness: There is no abdominal tenderness.  Musculoskeletal:     Right lower leg: She  exhibits no tenderness. No edema.     Left lower leg: She exhibits no tenderness. No edema.  Skin:    General: Skin is warm and dry.  Neurological:     General: No focal deficit present.     Mental Status: She is alert.     Motor: No weakness.  Psychiatric:        Mood and Affect: Mood normal. Mood is not anxious.        Behavior: Behavior normal.      ED Treatments / Results  Labs (all labs ordered are listed, but only abnormal results are displayed) Labs Reviewed  COMPREHENSIVE METABOLIC PANEL - Abnormal; Notable for the following components:      Result Value   AST 59 (*)    ALT 86 (*)    All other components within normal limits  LIPASE, BLOOD - Abnormal; Notable for the following components:   Lipase 95 (*)    All other components within normal limits  CBC WITH DIFFERENTIAL/PLATELET  D-DIMER, QUANTITATIVE (NOT AT [facility])  TROPONIN I (HIGH SENSITIVITY)  TROPONIN I (HIGH SENSITIVITY)    EKG EKG Interpretation  Date/Time:  Thursday January 03 2019 20:07:44 EDT Ventricular Rate:  63 PR Interval:    QRS Duration: 97 QT Interval:  428 QTC Calculation: 439 R Axis:   48 Text Interpretation:  Sinus rhythm Prolonged PR interval Low voltage, precordial leads Probable anteroseptal infarct, old ST-t wave abnormality Confirmed by Carmin Muskrat [phone]) on 01/03/2019 9:02:33 PM   Radiology Dg Chest 2 View  Result Date: 01/03/2019  CLINICAL DATA:  53 year old female with chest pain x1 0.5 hours. EXAM: CHEST - 2 VIEW COMPARISON:  Thoracic spine radiographs 12/17/2018. FINDINGS: Stable cardiac size at the upper limits of normal. Mildly tortuous thoracic aorta. Other mediastinal contours are within normal limits. Visualized tracheal air column is within normal limits. Stable lung volumes. No pneumothorax, pulmonary edema, pleural effusion or confluent pulmonary opacity. No acute osseous abnormality identified. Negative visible bowel gas pattern. IMPRESSION: No acute cardiopulmonary abnormality. Electronically Signed   By: Genevie Ann [name]   On: 01/03/2019 20:42    Procedures Procedures (including critical care time)  Medications Ordered in ED Medications  pantoprazole (PROTONIX) injection 40 mg (40 mg Intravenous Given 01/03/19 2016)  acetaminophen (TYLENOL) tablet 1,000 mg (1,000 mg Oral Given 01/03/19 2016)  sodium chloride 0.9 % bolus 500 mL (0 mLs Intravenous Stopped 01/03/19 2154)  alum & mag hydroxide-simeth (MAALOX/MYLANTA) 200-200-20 MG/5ML suspension 30 mL (30 mLs Oral Given 01/03/19 2119)    And  lidocaine (XYLOCAINE) 2 % viscous mouth solution 15 mL (15 mLs Oral Given 01/03/19 2119)     Initial Impression / Assessment and Plan / ED Course  I have reviewed the triage vital signs and the nursing notes.  Pertinent labs & imaging results that were available during my care of the patient were reviewed by me and considered in my medical decision making (see chart for details).       Patient presents today for evaluation of chest pain that occurred while she was at work in the emergency room today.  Chest pain is right-sided in nature, has been present for under 1 hour and radiates into her back.  Orders for labs were placed.  Unable to apply PERC criteria based on her age therefore d-dimer was ordered which was negative.  EKG without evidence of ischemia.  She reported a mild headache, therefore Tylenol was given.  She feels like  her pain is burning  in nature, was initially treated with IV Protonix without significant relief, orders for 500 cc IV fluid, Maalox, and lidocaine were ordered.  This patient was seen as a shared visit with Dr. Tamera Punt.  At shift change care was transferred to Dr. Vanita Panda who will follow pending studies, re-evaulate and determine disposition.     Final Clinical Impressions(s) / ED Diagnoses   Final diagnoses:  Atypical chest pain    ED Discharge Orders    None       Carla, Rashad 01/03/19 2226    Carmin Muskrat, MD 01/03/19 [phone]

## 2019-01-03 NOTE — ED Provider Notes (Signed)
Patient awake and alert, aware of all findings including mild elevation in lipase, patient troponin value. Some suspicion for gastroesophageal etiology, with consideration of mild pancreatitis as well. No evidence for ACS, no distress, and symptoms have improved substantially. Patient amenable to discharge with initiation of PPI, clear liquid diet, outpatient follow-up.   Carmin Muskrat, MD 01/03/19 2253

## 2019-02-18 ENCOUNTER — Encounter: Payer: Self-pay | Admitting: Osteopathic Medicine

## 2019-03-14 ENCOUNTER — Encounter: Payer: Self-pay | Admitting: Osteopathic Medicine

## 2019-03-14 ENCOUNTER — Other Ambulatory Visit: Payer: Self-pay

## 2019-03-14 ENCOUNTER — Ambulatory Visit (INDEPENDENT_AMBULATORY_CARE_PROVIDER_SITE_OTHER): Payer: No Typology Code available for payment source | Admitting: Osteopathic Medicine

## 2019-03-14 VITALS — BP 140/88 | HR 67 | Temp 98.3°F | Wt 231.9 lb

## 2019-03-14 DIAGNOSIS — R748 Abnormal levels of other serum enzymes: Secondary | ICD-10-CM

## 2019-03-14 DIAGNOSIS — Z23 Encounter for immunization: Secondary | ICD-10-CM

## 2019-03-14 DIAGNOSIS — M797 Fibromyalgia: Secondary | ICD-10-CM | POA: Diagnosis not present

## 2019-03-14 MED ORDER — CYCLOBENZAPRINE HCL 10 MG PO TABS: 5.0000 mg | ORAL_TABLET | Freq: Three times a day (TID) | ORAL | 1 refills | Status: DC | PRN

## 2019-03-14 MED ORDER — DULOXETINE HCL 20 MG PO CPEP: 20.0000 mg | ORAL_CAPSULE | Freq: Every day | ORAL | 1 refills | Status: DC

## 2019-03-14 MED FILL — HYDROCHLOROTHIAZIDE 25 MG T: 25 | 90 days supply | Qty: 90 | Fill #0

## 2019-03-14 MED FILL — DULOXETINE HCL 20 MG CPEP: 20 | 52 days supply | Qty: 90 | Fill #0

## 2019-03-14 MED FILL — CYCLOBENZAPRINE HCL 10 MG T: 10 | 30 days supply | Qty: 90 | Fill #0

## 2019-03-14 MED FILL — MELOXICAM 15 MG TABLET: 15 | 90 days supply | Qty: 90 | Fill #1

## 2019-03-14 NOTE — Progress Notes (Signed)
HPI: Melanie [name] is a 53 y.o. female who  has no past medical history on file.  she presents to [facility] today, 03/14/19,  for chief complaint of:  Pain follow-up  Longstanding issues with widespread pain symptoms.  Recent visit with rheumatology was nonrevealing except for slightly positive ANA, the rest of the work-up was negative for autoimmune/rheumatologic condition.  There is some osteoarthritis issues, but patient is definitely meeting criteria for fibromyalgia.  See scanned documents for WPI/SS scoring  She is understandably frustrated that there is not a more clear plan of action.  We discussed in detail the options for treatment for fibromyalgia.  Previous fibromyalgia treatments include... TCA: Amitriptyline No  SSRI/SNRI: Duloxetine No She has been taking Lexapro, has been off of this for a couple of weeks Muscle Relaxer: Cyclobenzaprine No  Anticonvulsants: Gabapentin No , Pregabalin No  NSAID Meloxicam, OTC meds physical activity Physically active at work, not much in the way of intentional exercise      At today's visit 03/14/19 ... PMH, PSH, FH reviewed and updated as needed.  Current medication list and allergy/intolerance hx reviewed and updated as needed. (See remainder of HPI, ROS, Phys Exam below)   No results found.  No results found for this or any previous visit (from the past 72 hour(s)).        ASSESSMENT/PLAN: The primary encounter diagnosis was Fibromyalgia. Diagnoses of Need for influenza vaccination and Elevated lipase were also pertinent to this visit.    Orders Placed This Encounter  Procedures  . Flu Vaccine QUAD 6+ mos PF IM (Fluarix Quad PF)  . Lipase     Meds ordered this encounter  Medications  . DULoxetine (CYMBALTA) 20 MG capsule    Sig: Take 1 capsule (20 mg total) by mouth daily. Can increase to 40 mg after 2 weeks    Dispense:  90 capsule    Refill:  1  . cyclobenzaprine  (FLEXERIL) 10 MG tablet    Sig: Take 0.5-1 tablets (5-10 mg total) by mouth 3 (three) times daily as needed for muscle spasms. Caution: can cause drowsiness    Dispense:  90 tablet    Refill:  1    Patient Instructions  Plan:  Continue the meloxicam as well as the omeprazole.  He does take daily as long as you are taking both.  If GI upset continues, or worsens, let me know and we can increase the omeprazole from 20 mg to 40 mg.  We started a couple other medications that will hopefully help with fibromyalgia pain.  Trial Flexeril 5 to 10 mg 3 times per day as needed for muscle spasm, definitely take 10 mg before bedtime, hopefully this will help with the waking up in pain.  After on the Flexeril for 1 to 2 weeks, can start the duloxetine/Cymbalta.  Can start with 20 mg and if you feel comfortable going up to 40 mg after a couple of weeks, let me know.  Otherwise, we can stay on 20 mg until I see you next and we can talk about this more.  Any side effects or concerns, please let us know right away.  Would also recommend scheduling an appointment with [name] to evaluate joint pain and determine the extent that osteoarthritis may be playing a role in your pain.       Follow-up plan: Return in about 4 weeks (around 04/11/2019) for RECHECK PAIN ON NEW MEDICATION.  Advised to follow-up with sports  medicine sooner than that, can do this while I am out of the office, there is probably some overlap with fibromyalgia/osteoarthritis other orthopedic issues.                                                 ################################################# ################################################# ################################################# #################################################    Current Meds  Medication Sig  . amphetamine-dextroamphetamine (ADDERALL XR) 15 MG 24 hr capsule Take 1 capsule by mouth daily.    Allergies   Allergen Reactions  . Clarithromycin Other (See Comments) and Rash    Other Reaction: Fever  . Morphine Other (See Comments)    Other Reaction: mental status alerted  . Sulfa Antibiotics Hives       Review of Systems:  Constitutional: No recent illness  Cardiac: No  chest pain, No  pressure, No palpitations  Respiratory:  No  shortness of breath.  Gastrointestinal: No  abdominal pain  Musculoskeletal: +chronic myalgia/arthralgia  Skin: No  Rash  Neurologic: No  Focal weakness, No  Dizziness  Exam:  BP 140/88 (BP Location: Left Arm, Patient Position: Sitting, Cuff Size: Large)   Pulse 67   Temp 98.3 F (36.8 C) (Oral)   Wt 231 lb 14.4 oz (105.2 kg)   BMI 36.32 kg/m   Constitutional: VS see above. General Appearance: alert, well-developed, well-nourished, NAD  Eyes: Normal lids and conjunctive, non-icteric sclera  Neck: No masses, trachea midline.   Respiratory: Normal respiratory effort.   Musculoskeletal: Gait normal. Symmetric and independent movement of all extremities  Neurological: Normal balance/coordination. No tremor.  Skin: warm, dry, intact.   Psychiatric: Normal judgment/insight. Normal mood and affect. Oriented x3.       Visit summary with medication list and pertinent instructions was printed for patient to review, patient was advised to alert [place] if any updates are needed. All questions at time of visit were answered - patient instructed to contact office with any additional concerns. ER/RTC precautions were reviewed with the patient and understanding verbalized.   Note: Total time spent 25 minutes, greater than 50% of the visit was spent face-to-face counseling and coordinating care for the following: The primary encounter diagnosis was Fibromyalgia. Diagnoses of Need for influenza vaccination and Elevated lipase were also pertinent to this visit.Marland Kitchen  Please note: voice recognition software was used to produce this document, and typos may escape  review. Please contact Dr. Sheppard Coil for any needed clarifications.    Follow up plan: Return in about 4 weeks (around 04/11/2019) for RECHECK PAIN ON NEW MEDICATION.

## 2019-03-14 NOTE — Patient Instructions (Signed)
Plan:  Continue the meloxicam as well as the omeprazole.  He does take daily as long as you are taking both.  If GI upset continues, or worsens, let me know and we can increase the omeprazole from 20 mg to 40 mg.  We started a couple other medications that will hopefully help with fibromyalgia pain.  Trial Flexeril 5 to 10 mg 3 times per day as needed for muscle spasm, definitely take 10 mg before bedtime, hopefully this will help with the waking up in pain.  After on the Flexeril for 1 to 2 weeks, can start the duloxetine/Cymbalta.  Can start with 20 mg and if you feel comfortable going up to 40 mg after a couple of weeks, let me know.  Otherwise, we can stay on 20 mg until I see you next and we can talk about this more.  Any side effects or concerns, please let us know right away.  Would also recommend scheduling an appointment with [name] to evaluate joint pain and determine the extent that osteoarthritis may be playing a role in your pain.

## 2019-03-15 DIAGNOSIS — M797 Fibromyalgia: Secondary | ICD-10-CM | POA: Insufficient documentation

## 2019-03-18 ENCOUNTER — Ambulatory Visit (INDEPENDENT_AMBULATORY_CARE_PROVIDER_SITE_OTHER): Payer: No Typology Code available for payment source

## 2019-03-18 ENCOUNTER — Ambulatory Visit (INDEPENDENT_AMBULATORY_CARE_PROVIDER_SITE_OTHER): Payer: No Typology Code available for payment source | Admitting: Sports Medicine

## 2019-03-18 ENCOUNTER — Encounter: Payer: Self-pay | Admitting: Sports Medicine

## 2019-03-18 ENCOUNTER — Other Ambulatory Visit: Payer: Self-pay

## 2019-03-18 ENCOUNTER — Other Ambulatory Visit: Payer: Self-pay | Admitting: Osteopathic Medicine

## 2019-03-18 DIAGNOSIS — G8929 Other chronic pain: Secondary | ICD-10-CM

## 2019-03-18 DIAGNOSIS — M5030 Other cervical disc degeneration, unspecified cervical region: Secondary | ICD-10-CM

## 2019-03-18 DIAGNOSIS — M25562 Pain in left knee: Secondary | ICD-10-CM

## 2019-03-18 DIAGNOSIS — M25511 Pain in right shoulder: Secondary | ICD-10-CM | POA: Diagnosis not present

## 2019-03-18 DIAGNOSIS — M797 Fibromyalgia: Secondary | ICD-10-CM

## 2019-03-18 DIAGNOSIS — M79671 Pain in right foot: Secondary | ICD-10-CM | POA: Insufficient documentation

## 2019-03-18 DIAGNOSIS — M25561 Pain in right knee: Secondary | ICD-10-CM

## 2019-03-18 DIAGNOSIS — M51369 Other intervertebral disc degeneration, lumbar region without mention of lumbar back pain or lower extremity pain: Secondary | ICD-10-CM

## 2019-03-18 DIAGNOSIS — Z1231 Encounter for screening mammogram for malignant neoplasm of breast: Secondary | ICD-10-CM

## 2019-03-18 DIAGNOSIS — M1811 Unilateral primary osteoarthritis of first carpometacarpal joint, right hand: Secondary | ICD-10-CM | POA: Diagnosis not present

## 2019-03-18 DIAGNOSIS — M170 Bilateral primary osteoarthritis of knee: Secondary | ICD-10-CM | POA: Insufficient documentation

## 2019-03-18 DIAGNOSIS — M5136 Other intervertebral disc degeneration, lumbar region: Secondary | ICD-10-CM

## 2019-03-18 DIAGNOSIS — M79672 Pain in left foot: Secondary | ICD-10-CM

## 2019-03-18 MED ORDER — OMEPRAZOLE 20 MG PO CPDR: 20.0000 mg | DELAYED_RELEASE_CAPSULE | Freq: Every day | ORAL | 0 refills | Status: DC

## 2019-03-18 MED ORDER — CELECOXIB 200 MG PO CAPS: ORAL_CAPSULE | ORAL | 2 refills | Status: DC

## 2019-03-18 MED ORDER — CYCLOBENZAPRINE HCL 5 MG PO TABS: 5.0000 mg | ORAL_TABLET | Freq: Every day | ORAL | 3 refills | Status: DC

## 2019-03-18 MED ORDER — PREDNISONE 50 MG PO TABS: ORAL_TABLET | ORAL | 0 refills | Status: DC

## 2019-03-18 NOTE — Assessment & Plan Note (Signed)
Referral for Dr. Raeford Razor for custom molded orthotics.

## 2019-03-18 NOTE — Assessment & Plan Note (Addendum)
I think this is more orthopedic rather than rheumatologic or myofascial. Rheumatoid testing was negative. Certainly Cymbalta is a good idea, she has not yet started it. We can keep this on the back burner for now.

## 2019-03-18 NOTE — Assessment & Plan Note (Signed)
With right-sided C7 radiculitis. Formal PT, prednisone, switching to Celebrex as she was getting some dyspepsia with meloxicam.

## 2019-03-18 NOTE — Assessment & Plan Note (Signed)
Aggressive formal physical therapy to start out with, prednisone. MRI for interventional planning if no better. There is radiation to the right hip.

## 2019-03-18 NOTE — Progress Notes (Signed)
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  Neck pain: Present for years, radiation down the right arm to the first through third fingers.  No progressive weakness, no trauma, no constitutional symptoms  Low back pain: Present for a long time, localized in the low back, worse with sitting, flexion Valsalva, radiation to the right posterior lateral hip.  No bowel or bladder dysfunction, saddle numbness, constitutional symptoms.  Right shoulder pain: Localized anteriorly, worse with overhead activities and reaching across chest.  Knee pain: Right-sided, no mechanical symptoms, moderate gelling.  Right hand pain: Localized to the thumb basal joint, moderate, persistent, localized without radiation.  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  I reviewed the past medical history, family history, social history, surgical history, and allergies today and no changes were needed.  Please see the problem list section below in epic for further details.  Past Medical History: No past medical history on file. Past Surgical History: Past Surgical History:  Procedure Laterality Date  . ABDOMINAL HYSTERECTOMY    . MYOMECTOMY     Social History: Social History   Socioeconomic History  . Marital status: Divorced    Spouse name: Not on file  . Number of children: Not on file  . Years of education: Not on file  . Highest education level: Not on file  Occupational History  . Not on file  Social Needs  . Financial resource strain: Not on file  . Food insecurity    Worry: Not on file    Inability: Not on file  . Transportation needs    Medical: Not on file    Non-medical: Not on file  Tobacco Use  . Smoking status: Never Smoker  . Smokeless tobacco: Never Used  Substance and Sexual Activity  . Alcohol use: Yes    Alcohol/week: 1.0 standard drinks     Types: 1 Cans of beer per week    Comment: occ  . Drug use: Never  . Sexual activity: Not Currently    Birth control/protection: Surgical  Lifestyle  . Physical activity    Days per week: Not on file    Minutes per session: Not on file  . Stress: Not on file  Relationships  . Social Herbalist on phone: Not on file    Gets together: Not on file    Attends religious service: Not on file    Active member of club or organization: Not on file    Attends meetings of clubs or organizations: Not on file    Relationship status: Not on file  Other Topics Concern  . Not on file  Social History Narrative  . Not on file   Family History: Family History  Problem Relation Age of Onset  . Lung cancer Father   . Diabetes Mother   . Skin cancer Mother    Allergies: Allergies  Allergen Reactions  . Clarithromycin Other (See Comments) and Rash    Other Reaction: Fever  . Morphine Other (See Comments)    Other Reaction: mental status alerted  . Sulfa Antibiotics Hives   Medications: See med rec.  Review of Systems: No fevers, chills, night sweats, weight loss, chest pain, or shortness of breath.   Objective:    General: Well Developed, well nourished, and in no acute distress.  Neuro: Alert and oriented  x3, extra-ocular muscles intact, sensation grossly intact.  HEENT: Normocephalic, atraumatic, pupils equal round reactive to light, neck supple, no masses, no lymphadenopathy, thyroid nonpalpable.  Skin: Warm and dry, no rashes. Cardiac: Regular rate and rhythm, no murmurs rubs or gallops, no lower extremity edema.  Respiratory: Clear to auscultation bilaterally. Not using accessory muscles, speaking in full sentences. Right shoulder: Inspection reveals no abnormalities, atrophy or asymmetry. Positive crossarm sign, tender over the acromioclavicular joint and bicipital groove ROM is full in all planes. Rotator cuff strength weak to abduction Positive Neer and Hawkin's  tests, empty can. Speeds and Yergason's tests normal. No labral pathology noted with negative Obrien's, negative crank, negative clunk, and good stability. Normal scapular function observed. No painful arc and no drop arm sign. No apprehension sign Right hand: Tender to palpation at the thumb basal joint.  Impression and Recommendations:    Bilateral foot pain Referral for Dr. Raeford Razor for custom molded orthotics.  Chronic right shoulder pain Pain at the acromioclavicular joint with some rotator cuff signs. X-rays, formal PT.  DDD (degenerative disc disease), cervical With right-sided C7 radiculitis. Formal PT, prednisone, switching to Celebrex as she was getting some dyspepsia with meloxicam.  Fibromyalgia I think this is more orthopedic rather than rheumatologic or myofascial. Rheumatoid testing was negative. Certainly Cymbalta is a good idea, she has not yet started it. We can keep this on the back burner for now.  Lumbar degenerative disc disease Aggressive formal physical therapy to start out with, prednisone. MRI for interventional planning if no better. There is radiation to the right hip.  Primary osteoarthritis of first carpometacarpal joint of right hand X-rays, Celebrex  Bilateral knee pain Suspect knee osteoarthritis, bilateral x-rays.  I spent 40 minutes with this patient, greater than 50% was face-to-face time counseling regarding the above diagnoses  ___________________________________________ [name]. [name], [name], ABFM., CAQSM. Primary Care and Sports Medicine  [facility]  Adjunct Professor of [employer]

## 2019-03-18 NOTE — Assessment & Plan Note (Signed)
Suspect knee osteoarthritis, bilateral x-rays.

## 2019-03-18 NOTE — Assessment & Plan Note (Signed)
X-rays, Celebrex

## 2019-03-18 NOTE — Assessment & Plan Note (Signed)
Pain at the acromioclavicular joint with some rotator cuff signs. X-rays, formal PT.

## 2019-04-01 ENCOUNTER — Encounter: Payer: No Typology Code available for payment source | Admitting: Family Medicine

## 2019-04-01 NOTE — Progress Notes (Deleted)
  Synia Douglass - 53 y.o. female MRN [id number]  Date of birth: June 10, 1966  SUBJECTIVE:  Including CC & ROS.  No chief complaint on file.   Elvis Boot Hartwell is a 53 y.o. female that is  ***.  ***   Review of Systems  HISTORY: Past Medical, Surgical, Social, and Family History Reviewed & Updated per EMR.   Pertinent Historical Findings include:  No past medical history on file.  Past Surgical History:  Procedure Laterality Date  . ABDOMINAL HYSTERECTOMY    . MYOMECTOMY      Allergies  Allergen Reactions  . Clarithromycin Other (See Comments) and Rash    Other Reaction: Fever  . Morphine Other (See Comments)    Other Reaction: mental status alerted  . Sulfa Antibiotics Hives    Family History  Problem Relation Age of Onset  . Lung cancer Father   . Diabetes Mother   . Skin cancer Mother      Social History   Socioeconomic History  . Marital status: Divorced    Spouse name: Not on file  . Number of children: Not on file  . Years of education: Not on file  . Highest education level: Not on file  Occupational History  . Not on file  Social Needs  . Financial resource strain: Not on file  . Food insecurity    Worry: Not on file    Inability: Not on file  . Transportation needs    Medical: Not on file    Non-medical: Not on file  Tobacco Use  . Smoking status: Never Smoker  . Smokeless tobacco: Never Used  Substance and Sexual Activity  . Alcohol use: Yes    Alcohol/week: 1.0 standard drinks    Types: 1 Cans of beer per week    Comment: occ  . Drug use: Never  . Sexual activity: Not Currently    Birth control/protection: Surgical  Lifestyle  . Physical activity    Days per week: Not on file    Minutes per session: Not on file  . Stress: Not on file  Relationships  . Social Herbalist on phone: Not on file    Gets together: Not on file    Attends religious service: Not on file    Active member of club or organization: Not on  file    Attends meetings of clubs or organizations: Not on file    Relationship status: Not on file  . Intimate partner violence    Fear of current or ex partner: Not on file    Emotionally abused: Not on file    Physically abused: Not on file    Forced sexual activity: Not on file  Other Topics Concern  . Not on file  Social History Narrative  . Not on file     PHYSICAL EXAM:  VS: There were no vitals taken for this visit. Physical Exam Gen: NAD, alert, cooperative with exam, well-appearing ENT: normal lips, normal nasal mucosa,  Eye: normal EOM, normal conjunctiva and lids CV:  no edema, +2 pedal pulses   Resp: no accessory muscle use, non-labored,  GI: no masses or tenderness, no hernia  Skin: no rashes, no areas of induration  Neuro: normal tone, normal sensation to touch Psych:  normal insight, alert and oriented MSK:  ***      ASSESSMENT & PLAN:   No problem-specific Assessment & Plan notes found for this encounter.

## 2019-04-03 MED FILL — FLUTICASONE PROP 50 MCG SPR: 50 | 90 days supply | Qty: 48 | Fill #0

## 2019-04-04 ENCOUNTER — Other Ambulatory Visit: Payer: Self-pay

## 2019-04-04 ENCOUNTER — Ambulatory Visit (INDEPENDENT_AMBULATORY_CARE_PROVIDER_SITE_OTHER): Payer: No Typology Code available for payment source | Admitting: Physical Therapy

## 2019-04-04 DIAGNOSIS — M25511 Pain in right shoulder: Secondary | ICD-10-CM

## 2019-04-04 DIAGNOSIS — M79641 Pain in right hand: Secondary | ICD-10-CM

## 2019-04-04 DIAGNOSIS — M6281 Muscle weakness (generalized): Secondary | ICD-10-CM | POA: Diagnosis not present

## 2019-04-04 DIAGNOSIS — M25611 Stiffness of right shoulder, not elsewhere classified: Secondary | ICD-10-CM

## 2019-04-04 NOTE — Patient Instructions (Signed)
Access Code: [email or web address]  URL: [email or web address]  Date: 04/04/2019  Prepared by: Almyra Free Amyla Heffner   Exercises Neck Retraction - 10 reps - 2 sets - 2-3 hold - 1x daily - 7x weekly Seated Thoracic Lumbar Extension - 10 reps - 1 sets - 2x daily - 7x weekly Supine Chest Stretch on Foam Roll - 2 reps - 1 sets - max hold - 1x daily - 7x weekly Thoracic Extension Mobilization on Foam Roll - 5 reps - 2 sets - 2x daily - 7x weekly Patient Education Trigger Point Dry Needling
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
## 2019-04-08 ENCOUNTER — Encounter: Payer: No Typology Code available for payment source | Admitting: Physical Therapy

## 2019-04-10 ENCOUNTER — Encounter: Payer: Self-pay | Admitting: Physical Therapy

## 2019-04-10 ENCOUNTER — Ambulatory Visit (INDEPENDENT_AMBULATORY_CARE_PROVIDER_SITE_OTHER): Payer: No Typology Code available for payment source | Admitting: Physical Therapy

## 2019-04-10 ENCOUNTER — Other Ambulatory Visit: Payer: Self-pay

## 2019-04-10 DIAGNOSIS — M6281 Muscle weakness (generalized): Secondary | ICD-10-CM | POA: Diagnosis not present

## 2019-04-10 DIAGNOSIS — M25611 Stiffness of right shoulder, not elsewhere classified: Secondary | ICD-10-CM

## 2019-04-10 DIAGNOSIS — M79641 Pain in right hand: Secondary | ICD-10-CM | POA: Diagnosis not present

## 2019-04-10 DIAGNOSIS — M25511 Pain in right shoulder: Secondary | ICD-10-CM

## 2019-04-10 NOTE — Therapy (Signed)
[place], [place], [address] Phone: [phone]   Fax:  [phone]  Physical Therapy Treatment  Patient Details  Name: Melanie [name] MRN: [id number] Date of Birth: [birth year] Referring Provider (PT): Dr. Helane Rima   Encounter Date: 04/10/2019  PT End of Session - 04/10/19 1209    Visit Number  2    Number of Visits  12    Date for PT Re-Evaluation  05/16/19    PT Start Time  3846   pt arrived late   PT Stop Time  1245    PT Time Calculation (min)  50 min    Activity Tolerance  Patient tolerated treatment well    Behavior During Therapy  [facility] for tasks assessed/performed       History reviewed. No pertinent past medical history.  Past Surgical History:  Procedure Laterality Date  . ABDOMINAL HYSTERECTOMY    . MYOMECTOMY      There were no vitals filed for this visit.  Subjective Assessment - 04/10/19 1202    Subjective  Pt reports she has not done any of the exercises yet.  She is interested in DN; also interested in having Rt knee evaluated.    Currently in Pain?  Yes    Pain Score  6     Pain Location  Arm    Pain Orientation  Right    Pain Descriptors / Indicators  Heaviness;Tightness;Dull    Aggravating Factors   end range shoulder, abdct    Pain Relieving Factors  ?         [facility] PT Assessment - 04/10/19 0001      Assessment   Medical Diagnosis  chronic right shoulder pain    Referring Provider (PT)  Dr. Helane Rima    Onset Date/Surgical Date  09/16/18    Hand Dominance  Right       [facility] Adult PT Treatment/Exercise - 04/10/19 0001      Self-Care   Self-Care  Other Self-Care Comments;Posture    Posture  Pt educated on posture and how it relates to radicular symptoms; pt verbalized understanding    Other Self-Care Comments   pt educated on self massage with ball to posterior shoulder girdle; demonstration provided; pt verbalized understanding       Neck  Exercises: Seated   Neck Retraction  5 reps;5 secs    Neck Retraction Limitations  tactile cues for improved form    Other Seated Exercise  scap retraction x 10 sec x 5 reps     Other Seated Exercise  Rt ulnar nerve stretch (bird man) x 15 sec;  Rt median nerve stretch x 2 reps       Shoulder Exercises: Stretch   Other Shoulder Stretches  3 position doorway stretch - x 15 sec each position (limited tolerance for low and high positions) ; seated thoracic ext over back of chair (hands supporting head) x 3 reps of 5 sec     Other Shoulder Stretches  pt shown thoracic ext over bolster in supine (review of HEP)      Wrist Exercises   Other wrist exercises  Rt wrist ext stretch x 3 reps of 5-10 sec; prayer stretch x 15 sec x 2 (limited wrist ext in Rt wrist compared to LUE)      Modalities   Modalities  Moist Heat;Electrical Stimulation      Moist Heat Therapy   Number Minutes Moist Heat  10 Minutes  Moist Heat Location  Shoulder;Wrist   Rt     Electrical Stimulation   Electrical Stimulation Location  Rt shoulder/ Rt wrist and lateral elbow     Electrical Stimulation Action  premod to each area    Electrical Stimulation Parameters  intensity to tolerance     Electrical Stimulation Goals  Pain      Manual Therapy   Manual Therapy  Soft tissue mobilization    Manual therapy comments  Pt seated    Soft tissue mobilization  IASTM to ant/ post Rt shoulder to decrease fascial restrictions and improve ROM.               PT Education - 04/10/19 1240    Education Details  HEP, TENS    Person(s) Educated  Patient    Methods  Explanation;Demonstration;Verbal cues;Handout;Tactile cues    Comprehension  Verbalized understanding;Returned demonstration          PT Long Term Goals - 04/04/19 1309      PT LONG TERM GOAL #1   Title  Ind with HEP for strengthening and flexibility    Time  6    Period  Weeks    Status  New    Target Date  05/16/19      PT LONG TERM GOAL #2    Title  Improved Right shoulder strength to 4+/5 or better to normalize ADLS    Time  6    Status  New      PT LONG TERM GOAL #3   Title  Decreased pain in right shoulder and hand with ADLS by 81%.    Time  6    Period  Weeks    Status  New      PT LONG TERM GOAL #4   Title  improved avg grip strength in right hand to equal left    Time  6    Period  Weeks    Status  New            Plan - 04/10/19 1251    Clinical Impression Statement  Pt tolerated new exercises without increase in radicular symptoms. Pt reported tenderness in Rt posterior shoulder with IASTM; fascial tightness noted throughout entire chain in RUE.  Pt given encouragement to comply with HEP to assist in reduction of pain and meeting goals. No goals met yet; only 2nd visit.    Personal Factors and Comorbidities  Fitness;Comorbidity 3+    Comorbidities  DDD cervical and lumbar, 4 abdominal surgeries, HTN, prediabetic, anxiety/dep, ADHD, right foot pain    Examination-Activity Limitations  Reach Overhead    Stability/Clinical Decision Making  Stable/Uncomplicated    Rehab Potential  Good    PT Frequency  2x / week    PT Duration  6 weeks    PT Treatment/Interventions  ADLs/Self Care Home Management;Cryotherapy;Electrical Stimulation;Iontophoresis 109m/ml Dexamethasone;Moist Heat;Ultrasound;Therapeutic exercise;Neuromuscular re-education;Patient/family education;Manual techniques;Dry needling;Taping    PT Next Visit Plan  postural and shoulder strengthening, thoracic mobilty, DN to hand/UT    PT Home Exercise Plan  [id number]    Consulted and Agree with Plan of Care  Patient       Patient will benefit from skilled therapeutic intervention in order to improve the following deficits and impairments:  Decreased range of motion, Pain, Impaired UE functional use, Increased muscle spasms, Impaired flexibility, Decreased strength, Postural dysfunction  Visit Diagnosis: Acute pain of right shoulder  Stiffness of right  shoulder, not elsewhere classified  Pain in right hand  Muscle  weakness (generalized)     Problem List Patient Active Problem List   Diagnosis Date Noted  . Primary osteoarthritis of first carpometacarpal joint of right hand 03/18/2019  . Chronic right shoulder pain 03/18/2019  . Bilateral foot pain 03/18/2019  . Bilateral knee pain 03/18/2019  . Fibromyalgia 03/15/2019  . Lumbar degenerative disc disease 12/19/2018  . Myalgia 11/27/2018  . Morning stiffness of joints 11/27/2018  . DDD (degenerative disc disease), cervical 11/27/2018  . Swelling of left upper eyelid 09/24/2018  . Attention deficit hyperactivity disorder (ADHD), predominantly inattentive type 02/27/2018  . Arthralgia 02/27/2018  . Screening for malignant neoplasm of colon 02/27/2018  . H/O: hysterectomy 02/27/2018  . History of prediabetes 02/27/2018  . History of hypothyroidism 02/27/2018  . NASH (nonalcoholic steatohepatitis) 02/27/2018  . GAD (generalized anxiety disorder) 01/06/2017  . Moderate episode of recurrent major depressive disorder ([place]) 01/06/2017  . Environmental allergies 09/12/2015  . Essential hypertension 09/12/2015  . OSA on CPAP 09/12/2015   Kerin Perna, PTA 04/10/19 1:10 PM  [facility] [place], [place], [address] Phone: [phone]   Fax:  [phone]  Name: Melanie [name] MRN: [id number] Date of Birth: 1966/02/05

## 2019-04-10 NOTE — Patient Instructions (Signed)
TENS UNIT: This is helpful for muscle pain and spasm.   Search and Purchase a TENS 7000 2nd edition at [employer] It should be less than $30.     TENS unit instructions: Do not shower or bathe with the unit on Turn the unit off before removing electrodes or batteries If the electrodes lose stickiness add a drop of water to the electrodes after they are disconnected from the unit and place on plastic sheet. If you continued to have difficulty, call the TENS unit company to purchase more electrodes. Do not apply lotion on the skin area prior to use. Make sure the skin is clean and dry as this will help prolong the life of the electrodes. After use, always check skin for unusual red areas, rash or other skin difficulties. If there are any skin problems, does not apply electrodes to the same area. Never remove the electrodes from the unit by pulling the wires. Do not use the TENS unit or electrodes other than as directed. Do not change electrode placement without consultating your therapist or physician. Keep 2 fingers with between each electrode. Wear time ratio is 2:1, on to off times.    For example on for 30 minutes off for 15 minutes and then on for 30 minutes off for 15 minutes   TENS stands for Transcutaneous Electrical Nerve Stimulation. In other words, electrical impulses are allowed to pass through the skin in order to excite a nerve.   Purpose and Use of TENS:  TENS is a method used to manage acute and chronic pain without the use of drugs. It has been effective in managing pain associated with surgery, sprains, strains, trauma, rheumatoid arthritis, and neuralgias. It is a non-addictive, low risk, and non-invasive technique used to control pain. It is not, by any means, a curative form of treatment.   How TENS Works:  Most TENS units are a Paramedic unit powered by one 9 volt battery. Attached to the outside of the unit are two lead wires where two pins and/or snaps  connect on each wire. All units come with a set of four reusable pads or electrodes. These are placed on the skin surrounding the area involved. By inserting the leads into  the pads, the electricity can pass from the unit making the circuit complete.  As the intensity is turned up slowly, the electrical current enters the body from the electrodes through the skin to the surrounding nerve fibers. This triggers the release of hormones from within the body. These hormones contain pain relievers. By increasing the circulation of these hormones, the person's pain may be lessened. It is also believed that the electrical stimulation itself helps to block the pain messages being sent to the brain, thus also decreasing the body's perception of pain.   Hazards:  TENS units are NOT to be used by patients with PACEMAKERS, DEFIBRILLATORS, DIABETIC PUMPS, PREGNANT WOMEN, and patients with SEIZURE DISORDERS.  TENS units are NOT to be used over the heart, throat, brain, or spinal cord.  One of the major side effects from the TENS unit may be skin irritation. Some people may develop a rash if they are sensitive to the materials used in the electrodes or the connecting wires.     Avoid overuse due the body getting used to the stem making it not as effective over time.

## 2019-04-11 ENCOUNTER — Ambulatory Visit: Payer: No Typology Code available for payment source | Admitting: Osteopathic Medicine

## 2019-04-11 ENCOUNTER — Ambulatory Visit: Payer: No Typology Code available for payment source

## 2019-04-15 ENCOUNTER — Ambulatory Visit: Payer: No Typology Code available for payment source | Admitting: Sports Medicine

## 2019-04-15 ENCOUNTER — Other Ambulatory Visit: Payer: Self-pay

## 2019-04-15 ENCOUNTER — Ambulatory Visit (INDEPENDENT_AMBULATORY_CARE_PROVIDER_SITE_OTHER): Payer: No Typology Code available for payment source | Admitting: Physical Therapy

## 2019-04-15 DIAGNOSIS — M25511 Pain in right shoulder: Secondary | ICD-10-CM | POA: Diagnosis not present

## 2019-04-15 DIAGNOSIS — M79641 Pain in right hand: Secondary | ICD-10-CM | POA: Diagnosis not present

## 2019-04-15 DIAGNOSIS — M6281 Muscle weakness (generalized): Secondary | ICD-10-CM

## 2019-04-15 DIAGNOSIS — M25611 Stiffness of right shoulder, not elsewhere classified: Secondary | ICD-10-CM

## 2019-04-15 NOTE — Therapy (Addendum)
[facility] [place], [place], [address] Phone: [phone]   Fax:  [phone]  Physical Therapy Treatment  Patient Details  Name: Melanie [name] MRN: [id number] Date of Birth: 01/17/1966 Referring Provider (PT): Dr. Helane Rima   Encounter Date: 04/15/2019  PT End of Session - 04/15/19 1524    Visit Number  3    Number of Visits  12    Date for PT Re-Evaluation  05/16/19    PT Start Time  1524    PT Stop Time  1621    PT Time Calculation (min)  57 min    Activity Tolerance  Patient tolerated treatment well    Behavior During Therapy  [facility] for tasks assessed/performed       No past medical history on file.  Past Surgical History:  Procedure Laterality Date  . ABDOMINAL HYSTERECTOMY    . MYOMECTOMY      There were no vitals filed for this visit.  Subjective Assessment - 04/15/19 1525    Subjective  a little bit better than last visit. I've been splinting some at night. I haven't done my exercises yet.    Pertinent History  DDD cervical and lumbar, 4 abdominal surgeries, HTN, prediabetic, anxiety/dep, ADHD    Patient Stated Goals  increase motion of shoulder    Currently in Pain?  Yes    Pain Score  4     Pain Location  Arm    Pain Orientation  Right    Pain Type  Chronic pain                       [facility] Adult PT Treatment/Exercise - 04/15/19 0001      Neck Exercises: Seated   Other Seated Exercise  Rt ulnar nerve stretch; no pull except in wrist joint      Shoulder Exercises: Stretch   Other Shoulder Stretches  standing upper back stretch in serve position with pinkies and elbows together 5 x 5 sec      Wrist Exercises   Other wrist exercises  seated wrist ext stretch 2 x 30 sec; prayer stretch 2x 20 sec      Modalities   Modalities  Electrical Stimulation;Moist Heat      Moist Heat Therapy   Number Minutes Moist Heat  12 Minutes    Moist Heat Location   Hand;Other (comment)   t-spine     Electrical Stimulation   Electrical Stimulation Location  Rt thoracic    Electrical Stimulation Action  premod    Electrical Stimulation Parameters  to tolerance x 12 min    Electrical Stimulation Goals  Pain      Manual Therapy   Manual Therapy  Soft tissue mobilization    Soft tissue mobilization  bil hands, ant post shoulder and deltoids       Trigger Point Dry Needling - 04/15/19 0001    Consent Given?  Yes    Education Handout Provided  Yes    Muscles Treated Upper Quadrant  Pectoralis major;Teres major;Latissimus dorsi    Muscles Treated Wrist/Hand  Opponens pollicis   interossei; bil   Pectoralis Major Response  Palpable increased muscle length    Latissimus dorsi Response  Palpable increased muscle length    Teres major Response  Palpable increased muscle length    Opponens pollicis Response  Twitch response elicited;Palpable increased muscle length           PT Education -  04/15/19 1620    Education Details  importance of HEP compliance, DN education and aftercare    Person(s) Educated  Patient    Methods  Explanation    Comprehension  Verbalized understanding          PT Long Term Goals - 04/04/19 1309      PT LONG TERM GOAL #1   Title  Ind with HEP for strengthening and flexibility    Time  6    Period  Weeks    Status  New    Target Date  05/16/19      PT LONG TERM GOAL #2   Title  Improved Right shoulder strength to 4+/5 or better to normalize ADLS    Time  6    Status  New      PT LONG TERM GOAL #3   Title  Decreased pain in right shoulder and hand with ADLS by 81%.    Time  6    Period  Weeks    Status  New      PT LONG TERM GOAL #4   Title  improved avg grip strength in right hand to equal left    Time  6    Period  Weeks    Status  New            Plan - 04/15/19 1612    Clinical Impression Statement  Patient responded very well to DN in bil hands. No significant twitch response in left  shoulder, but less tension noted afterwards. Improved mobility in Right wrist today with stretches. Patient non compliant with HEP. PT educated on importance of compliance to pain relief.    Comorbidities  DDD cervical and lumbar, 4 abdominal surgeries, HTN, prediabetic, anxiety/dep, ADHD, right foot pain    PT Treatment/Interventions  ADLs/Self Care Home Management;Cryotherapy;Electrical Stimulation;Iontophoresis 31m/ml Dexamethasone;Moist Heat;Ultrasound;Therapeutic exercise;Neuromuscular re-education;Patient/family education;Manual techniques;Dry needling;Taping    PT Next Visit Plan  postural and shoulder strengthening, thoracic mobilty, Assess DN    PT Home Exercise Plan  [id number]       Patient will benefit from skilled therapeutic intervention in order to improve the following deficits and impairments:  Decreased range of motion, Pain, Impaired UE functional use, Increased muscle spasms, Impaired flexibility, Decreased strength, Postural dysfunction  Visit Diagnosis: Acute pain of right shoulder  Pain in right hand  Muscle weakness (generalized)  Stiffness of right shoulder, not elsewhere classified     Problem List Patient Active Problem List   Diagnosis Date Noted  . Primary osteoarthritis of first carpometacarpal joint of right hand 03/18/2019  . Chronic right shoulder pain 03/18/2019  . Bilateral foot pain 03/18/2019  . Bilateral knee pain 03/18/2019  . Fibromyalgia 03/15/2019  . Lumbar degenerative disc disease 12/19/2018  . Myalgia 11/27/2018  . Morning stiffness of joints 11/27/2018  . DDD (degenerative disc disease), cervical 11/27/2018  . Swelling of left upper eyelid 09/24/2018  . Attention deficit hyperactivity disorder (ADHD), predominantly inattentive type 02/27/2018  . Arthralgia 02/27/2018  . Screening for malignant neoplasm of colon 02/27/2018  . H/O: hysterectomy 02/27/2018  . History of prediabetes 02/27/2018  . History of hypothyroidism 02/27/2018  .  NASH (nonalcoholic steatohepatitis) 02/27/2018  . GAD (generalized anxiety disorder) 01/06/2017  . Moderate episode of recurrent major depressive disorder (HLeal 01/06/2017  . Environmental allergies 09/12/2015  . Essential hypertension 09/12/2015  . OSA on CPAP 09/12/2015    Lynnley Doddridge PT 04/15/2019, 4:26 PM  [id number]  [place], [place], [address] Phone: [phone]   Fax:  [phone]  Name: Melanie [name] MRN: [id number] Date of Birth: 08-16-1965  PHYSICAL THERAPY DISCHARGE SUMMARY  Visits from Start of Care: 3  Current functional level related to goals / functional outcomes: See above   Remaining deficits: See above   Education / Equipment: HEP Plan: Patient agrees to discharge.  Patient goals were not met. Patient is being discharged due to not returning since the last visit.  ?????    Madelyn Flavors, PT 07/10/19 9:23 AM  [facility] [place], [place] [address]  [phone] (office) [phone] (fax)

## 2019-04-19 ENCOUNTER — Ambulatory Visit: Payer: No Typology Code available for payment source | Admitting: Sports Medicine

## 2019-04-19 ENCOUNTER — Encounter: Payer: No Typology Code available for payment source | Admitting: Physical Therapy

## 2019-05-02 ENCOUNTER — Ambulatory Visit: Payer: No Typology Code available for payment source

## 2019-05-08 ENCOUNTER — Ambulatory Visit: Payer: No Typology Code available for payment source

## 2019-05-29 MED FILL — CEPHALEXIN 500 MG CAPSULE: 500 | 7 days supply | Qty: 14 | Fill #0

## 2019-06-06 ENCOUNTER — Ambulatory Visit: Payer: No Typology Code available for payment source

## 2019-06-24 ENCOUNTER — Ambulatory Visit (INDEPENDENT_AMBULATORY_CARE_PROVIDER_SITE_OTHER): Payer: No Typology Code available for payment source | Admitting: Medical-Surgical

## 2019-06-24 ENCOUNTER — Encounter: Payer: Self-pay | Admitting: Medical-Surgical

## 2019-06-24 ENCOUNTER — Other Ambulatory Visit: Payer: Self-pay

## 2019-06-24 VITALS — BP 116/81 | HR 74 | Temp 97.7°F | Ht 67.0 in | Wt 229.0 lb

## 2019-06-24 DIAGNOSIS — N3000 Acute cystitis without hematuria: Secondary | ICD-10-CM | POA: Diagnosis not present

## 2019-06-24 DIAGNOSIS — R300 Dysuria: Secondary | ICD-10-CM | POA: Diagnosis not present

## 2019-06-24 DIAGNOSIS — M654 Radial styloid tenosynovitis [de Quervain]: Secondary | ICD-10-CM | POA: Diagnosis not present

## 2019-06-24 LAB — POCT URINALYSIS DIP (CLINITEK)
Bilirubin, UA: NEGATIVE
Blood, UA: NEGATIVE
Glucose, UA: NEGATIVE mg/dL
Ketones, POC UA: NEGATIVE mg/dL
Leukocytes, UA: NEGATIVE
Nitrite, UA: POSITIVE — AB
POC PROTEIN,UA: NEGATIVE
Spec Grav, UA: 1.02 (ref 1.010–1.025)
Urobilinogen, UA: 1 E.U./dL
pH, UA: 6.5 (ref 5.0–8.0)

## 2019-06-24 MED ORDER — PHENAZOPYRIDINE HCL 100 MG PO TABS: 100.0000 mg | ORAL_TABLET | Freq: Three times a day (TID) | ORAL | 0 refills | Status: DC | PRN

## 2019-06-24 MED ORDER — CIPROFLOXACIN HCL 500 MG PO TABS: 500.0000 mg | ORAL_TABLET | Freq: Two times a day (BID) | ORAL | 0 refills | Status: AC

## 2019-06-24 NOTE — Progress Notes (Signed)
Subjective:    CC: Dysuria and right wrist pain  HPI:  Pleasant 53 year old female presenting with complaints of dysuria, frequency, urgency, burning, and occasional foul odor to her urine.  Was treated with Keflex for a UTI around Thanksgiving but symptoms have returned since then.  No hematuria noted but urine has been occasionally cloudy.  Reports pressure in suprapubic area.  Denies fever and chills.  Also reports right wrist pain that she has seen [name] for several years ago.  Pain along the thumb extending up wrist, worse with certain movements.  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  Taking Celebrex daily.   I reviewed the past medical history, family history, social history, surgical history, and allergies today and no changes were needed.  Please see the problem list section below in epic for further details.  Past Medical History: History reviewed. No pertinent past medical history. Past Surgical History: Past Surgical History:  Procedure Laterality Date  . ABDOMINAL HYSTERECTOMY    . MYOMECTOMY     Social History: Social History   Socioeconomic History  . Marital status: Divorced    Spouse name: Not on file  . Number of children: Not on file  . Years of education: Not on file  . Highest education level: Not on file  Occupational History  . Not on file  Tobacco Use  . Smoking status: Never Smoker  . Smokeless tobacco: Never Used  Substance and Sexual Activity  . Alcohol use: Yes    Alcohol/week: 1.0 standard drinks    Types: 1 Cans of beer per week    Comment: occ  . Drug use: Never  . Sexual activity: Not Currently    Birth control/protection: Surgical  Other Topics Concern  . Not on file  Social History Narrative  . Not on file   Social Determinants of Health   Financial Resource Strain:   . Difficulty of Paying Living Expenses: Not on file  Food Insecurity:   . Worried About Charity fundraiser in the Last Year: Not on file  . Ran Out of  Food in the Last Year: Not on file  Transportation Needs:   . Lack of Transportation (Medical): Not on file  . Lack of Transportation (Non-Medical): Not on file  Physical Activity:   . Days of Exercise per Week: Not on file  . Minutes of Exercise per Session: Not on file  Stress:   . Feeling of Stress : Not on file  Social Connections:   . Frequency of Communication with Friends and Family: Not on file  . Frequency of Social Gatherings with Friends and Family: Not on file  . Attends Religious Services: Not on file  . Active Member of Clubs or Organizations: Not on file  . Attends Archivist Meetings: Not on file  . Marital Status: Not on file   Family History: Family History  Problem Relation Age of Onset  . Lung cancer Father   . Diabetes Mother   . Skin cancer Mother    Allergies: Allergies  Allergen Reactions  . Clarithromycin Other (See Comments) and Rash    Other Reaction: Fever  . Morphine Other (See Comments)    Other Reaction: mental status alerted  . Sulfa Antibiotics Hives   Medications: See med rec.  Review of Systems: No fevers, chills, night sweats, weight loss, chest pain, or shortness of breath.   Objective:    General: Well Developed, well nourished, and in no acute distress.  Neuro: Alert and oriented x3. HEENT: Normocephalic, atraumatic, .  Skin: Warm and dry, no rashes. Cardiac: Regular rate and rhythm, no murmurs rubs or gallops, no lower extremity edema.  Respiratory: Clear to auscultation bilaterally. Not using accessory muscles, speaking in full sentences. Abdomen: Soft, nontender.  Bowel sounds positive x4.  No suprapubic tenderness or distention noted. Musculoskeletal: No CVA tenderness.  Positive Finkelstein's test.  Slight tenderness to palpation along thumb side of wrist.     Impression and Recommendations:    Acute cystitis without hematuria Urine sent for culture.  7-day course of Cipro ordered.  Pyridium ordered as needed  dysuria.  Tenosynovitis, de Quervain Right thumb spica splint given for splinting as often as possible.  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  Advised to continue taking Celebrex as ordered daily.  Plan for appointment with [name] for injection per patient preference.  Return if symptoms worsen or fail to improve.  ___________________________________________ Clearnce Sorrel, DNP, APRN, FNP-BC Primary Care and [place]

## 2019-06-24 NOTE — Assessment & Plan Note (Addendum)
Right thumb spica splint given for splinting as often as possible.  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  Advised to continue taking Celebrex as ordered daily.  Plan for appointment with [name] ASAP for injection per patient preference.

## 2019-06-24 NOTE — Assessment & Plan Note (Signed)
Urine sent for culture.  7-day course of Cipro ordered.  Pyridium ordered as needed dysuria.

## 2019-06-26 ENCOUNTER — Ambulatory Visit (INDEPENDENT_AMBULATORY_CARE_PROVIDER_SITE_OTHER): Payer: No Typology Code available for payment source | Admitting: Sports Medicine

## 2019-06-26 ENCOUNTER — Other Ambulatory Visit: Payer: Self-pay

## 2019-06-26 ENCOUNTER — Ambulatory Visit (INDEPENDENT_AMBULATORY_CARE_PROVIDER_SITE_OTHER): Payer: No Typology Code available for payment source

## 2019-06-26 ENCOUNTER — Ambulatory Visit: Payer: No Typology Code available for payment source

## 2019-06-26 VITALS — BP 147/77 | HR 80 | Ht 67.0 in | Wt 230.0 lb

## 2019-06-26 DIAGNOSIS — M654 Radial styloid tenosynovitis [de Quervain]: Secondary | ICD-10-CM | POA: Diagnosis not present

## 2019-06-26 DIAGNOSIS — M5030 Other cervical disc degeneration, unspecified cervical region: Secondary | ICD-10-CM | POA: Diagnosis not present

## 2019-06-26 DIAGNOSIS — M25561 Pain in right knee: Secondary | ICD-10-CM | POA: Diagnosis not present

## 2019-06-26 DIAGNOSIS — G8929 Other chronic pain: Secondary | ICD-10-CM | POA: Diagnosis not present

## 2019-06-26 DIAGNOSIS — Z23 Encounter for immunization: Secondary | ICD-10-CM

## 2019-06-26 DIAGNOSIS — M1811 Unilateral primary osteoarthritis of first carpometacarpal joint, right hand: Secondary | ICD-10-CM | POA: Diagnosis not present

## 2019-06-26 DIAGNOSIS — M25562 Pain in left knee: Secondary | ICD-10-CM

## 2019-06-26 DIAGNOSIS — Z1231 Encounter for screening mammogram for malignant neoplasm of breast: Secondary | ICD-10-CM

## 2019-06-26 LAB — URINE CULTURE
MICRO NUMBER:: 1218743
SPECIMEN QUALITY:: ADEQUATE

## 2019-06-26 MED ORDER — AMOXICILLIN-POT CLAVULANATE 875-125 MG PO TABS: 1.0000 | ORAL_TABLET | Freq: Two times a day (BID) | ORAL | 0 refills | Status: DC

## 2019-06-26 MED ORDER — CYCLOBENZAPRINE HCL 5 MG PO TABS: 5.0000 mg | ORAL_TABLET | Freq: Every day | ORAL | 3 refills | Status: DC

## 2019-06-26 MED ORDER — CELECOXIB 200 MG PO CAPS: ORAL_CAPSULE | ORAL | 2 refills | Status: DC

## 2019-06-26 MED FILL — CYCLOBENZAPRINE 5 MG TABLET: 5 | 30 days supply | Qty: 30 | Fill #0

## 2019-06-26 MED FILL — CELECOXIB 200 MG CAP: 200 | 30 days supply | Qty: 60 | Fill #0

## 2019-06-26 NOTE — Addendum Note (Signed)
Addended bySamuel Bouche on: 06/26/2019 02:55 PM   Modules accepted: Orders

## 2019-06-26 NOTE — Assessment & Plan Note (Signed)
Primary osteoarthritis of both knees, right worse than left, she does have a parapatellar plica that is somewhat uncomfortable, injection today, return in a month. I would also like her to do some PT

## 2019-06-26 NOTE — Assessment & Plan Note (Signed)
Right-sided first extensor compartment injection today. Return to see me in 1 month.

## 2019-06-26 NOTE — Assessment & Plan Note (Addendum)
Partial response to Celebrex, pain today was referrable more to the first extensor compartment, this was injected today, if recurrence or persistence of pain we will proceed with injection of the first [facility] joint.

## 2019-06-26 NOTE — Progress Notes (Signed)
Subjective:    CC: Multiple complaints  HPI: Melanie [name] returns, she has pain in her right wrist, she localizes it both at the thumb basal joint as well as the first extensor compartment over the radial styloid process, mild, persistent, localized without radiation.  In addition she has pain in her knee, anterior aspect, somewhat lateral to the patella with a slight popping sensation.  I reviewed the past medical history, family history, social history, surgical history, and allergies today and no changes were needed.  Please see the problem list section below in epic for further details.  Past Medical History: No past medical history on file. Past Surgical History: Past Surgical History:  Procedure Laterality Date  . ABDOMINAL HYSTERECTOMY    . MYOMECTOMY     Social History: Social History   Socioeconomic History  . Marital status: Divorced    Spouse name: Not on file  . Number of children: Not on file  . Years of education: Not on file  . Highest education level: Not on file  Occupational History  . Not on file  Tobacco Use  . Smoking status: Never Smoker  . Smokeless tobacco: Never Used  Substance and Sexual Activity  . Alcohol use: Yes    Alcohol/week: 1.0 standard drinks    Types: 1 Cans of beer per week    Comment: occ  . Drug use: Never  . Sexual activity: Not Currently    Birth control/protection: Surgical  Other Topics Concern  . Not on file  Social History Narrative  . Not on file   Social Determinants of Health   Financial Resource Strain:   . Difficulty of Paying Living Expenses: Not on file  Food Insecurity:   . Worried About Charity fundraiser in the Last Year: Not on file  . Ran Out of Food in the Last Year: Not on file  Transportation Needs:   . Lack of Transportation (Medical): Not on file  . Lack of Transportation (Non-Medical): Not on file  Physical Activity:   . Days of Exercise per Week: Not on file  . Minutes of Exercise per Session:  Not on file  Stress:   . Feeling of Stress : Not on file  Social Connections:   . Frequency of Communication with Friends and Family: Not on file  . Frequency of Social Gatherings with Friends and Family: Not on file  . Attends Religious Services: Not on file  . Active Member of Clubs or Organizations: Not on file  . Attends Archivist Meetings: Not on file  . Marital Status: Not on file   Family History: Family History  Problem Relation Age of Onset  . Lung cancer Father   . Diabetes Mother   . Skin cancer Mother    Allergies: Allergies  Allergen Reactions  . Clarithromycin Other (See Comments) and Rash    Other Reaction: Fever  . Morphine Other (See Comments)    Other Reaction: mental status alerted  . Sulfa Antibiotics Hives   Medications: See med rec.  Review of Systems: No fevers, chills, night sweats, weight loss, chest pain, or shortness of breath.   Objective:    General: Well Developed, well nourished, and in no acute distress.  Neuro: Alert and oriented x3, extra-ocular muscles intact, sensation grossly intact.  HEENT: Normocephalic, atraumatic, pupils equal round reactive to light, neck supple, no masses, no lymphadenopathy, thyroid nonpalpable.  Skin: Warm and dry, no rashes. Cardiac: Regular rate and rhythm, no murmurs rubs or gallops,  no lower extremity edema.  Respiratory: Clear to auscultation bilaterally. Not using accessory muscles, speaking in full sentences. Right wrist: Inspection normal with no visible erythema or swelling. ROM smooth and normal with good flexion and extension and ulnar/radial deviation that is symmetrical with opposite wrist. Palpation is normal over metacarpals, navicular, lunate, and TFCC; tendons without tenderness/ swelling No snuffbox tenderness. No tenderness over Canal of [place]. Strength 5/5 in all directions without pain. Negative tinel's and phalens signs. Very mild Finkelstein sign, also mild tenderness at the  thumb basal joint. Negative Watson's test. Right knee: Normal to inspection with no erythema or effusion or obvious bony abnormalities. Tender palpation in the medial joint line, both the medial and lateral patellar facets, palpable lateral parapatellar plica which explains the mechanical symptoms. ROM normal in flexion and extension and lower leg rotation. Ligaments with solid consistent endpoints including ACL, PCL, LCL, MCL. Negative Mcmurray's and provocative meniscal tests. Non painful patellar compression. Patellar and quadriceps tendons unremarkable. Hamstring and quadriceps strength is normal.  Procedure: Real-time Ultrasound Guided injection of the right first extensor compartment Device: Samsung HS60  Verbal informed consent obtained.  Time-out conducted.  Noted no overlying erythema, induration, or other signs of local infection.  Skin prepped in a sterile fashion.  Local anesthesia: Topical Ethyl chloride.  With sterile technique and under real time ultrasound guidance:  Using a 25-gauge needle advanced between the abductor pollicis longus and extensor pollicis brevis tendons and injected 1/2 cc Kenalog 40, 1/2 cc lidocaine.  Completed without difficulty  Pain immediately resolved suggesting accurate placement of the medication.  Advised to call if fevers/chills, erythema, induration, drainage, or persistent bleeding.  Images permanently stored and available for review in the ultrasound unit.  Impression: Technically successful ultrasound guided injection.  Procedure: Real-time Ultrasound Guided injection of the right knee Device: Samsung HS60  Verbal informed consent obtained.  Time-out conducted.  Noted no overlying erythema, induration, or other signs of local infection.  Skin prepped in a sterile fashion.  Local anesthesia: Topical Ethyl chloride.  With sterile technique and under real time ultrasound guidance: 1 cc Kenalog 40, 2 cc lidocaine, 2 cc bupivacaine  injected easily Completed without difficulty  Pain immediately resolved suggesting accurate placement of the medication.  Advised to call if fevers/chills, erythema, induration, drainage, or persistent bleeding.  Images permanently stored and available for review in the ultrasound unit.  Impression: Technically successful ultrasound guided injection.  Impression and Recommendations:    Primary osteoarthritis of first carpometacarpal joint of right hand Partial response to Celebrex, pain today was referrable more to the first extensor compartment, this was injected today, if recurrence or persistence of pain we will proceed with injection of the first [facility] joint.  Bilateral knee pain Primary osteoarthritis of both knees, right worse than left, she does have a parapatellar plica that is somewhat uncomfortable, injection today, return in a month. I would also like her to do some PT  Tenosynovitis, de Quervain Right-sided first extensor compartment injection today. Return to see me in 1 month.   ___________________________________________ [name]. [name], [name], ABFM., CAQSM. Primary Care and Sports Medicine [facility]  Adjunct Professor of [employer]

## 2019-07-10 MED FILL — FLUTICASONE PROP 50 MCG SPR: 50 | 90 days supply | Qty: 48 | Fill #1

## 2019-07-10 MED FILL — HYDROCHLOROTHIAZIDE 25 MG T: 25 | 90 days supply | Qty: 90 | Fill #0

## 2019-07-19 ENCOUNTER — Telehealth: Payer: Self-pay

## 2019-07-19 NOTE — Telephone Encounter (Signed)
Patient advised of recommendations. Also sent messsage on MyChart.

## 2019-07-19 NOTE — Telephone Encounter (Signed)
Here's my go-to list if she has [place]!   Medications & Home Remedies for Respiratory Illness   OTC Acetaminophen (Tylenol) 500 mg tablets - take max 2 tablets (1000 mg) every 6 hours (4 times per day)  OTC Ibuprofen (Motrin) 200 mg tablets - take max 4 tablets (800 mg) every 6 hours OTC Nasal Saline if desired to rinse OTC Oxymetolazone (Afrin, others) sparing use due to rebound congestion, NEVER use in kids OTC Phenylephrine (Sudafed) 10 mg tablets every 4 hours (or the 12-hour formulation)* OTC Diphenhydramine (Benadryl) 25 mg tablets - take max 2 tablets every 4 hours OTC Dextromethorphan (Robitussin, others) - cough suppressant OTC Guaifenesin (Robitussin, Mucinex, others) - expectorant (helps cough up mucus) (Dextromethorphan and Guaifenesin also come in a combination tablet/syrup) OTC Lozenges w/ Benzocaine + Menthol (Cepacol) Honey - as much as you want! Teas which "coat the throat" - look for ingredients Elm Bark, Licorice Root, Marshmallow Root

## 2019-07-19 NOTE — Telephone Encounter (Signed)
Melanie [name] called and reports congestion, cough, sore throat and fatigue. We do not have any openings today. She will get tested this afternoon for COVID. She wanted to know if she should taking any over the counter medications. Also she has prednisone taper and wanted to know if she should start the taper. Please advise.

## 2019-07-22 ENCOUNTER — Ambulatory Visit: Payer: No Typology Code available for payment source | Admitting: Medical-Surgical

## 2019-07-24 ENCOUNTER — Ambulatory Visit: Payer: No Typology Code available for payment source | Admitting: Sports Medicine

## 2019-08-29 ENCOUNTER — Encounter: Payer: Self-pay | Admitting: Medical-Surgical

## 2019-08-29 ENCOUNTER — Telehealth (INDEPENDENT_AMBULATORY_CARE_PROVIDER_SITE_OTHER): Payer: No Typology Code available for payment source | Admitting: Medical-Surgical

## 2019-08-29 DIAGNOSIS — F331 Major depressive disorder, recurrent, moderate: Secondary | ICD-10-CM | POA: Diagnosis not present

## 2019-08-29 DIAGNOSIS — M5030 Other cervical disc degeneration, unspecified cervical region: Secondary | ICD-10-CM

## 2019-08-29 DIAGNOSIS — F900 Attention-deficit hyperactivity disorder, predominantly inattentive type: Secondary | ICD-10-CM

## 2019-08-29 DIAGNOSIS — J019 Acute sinusitis, unspecified: Secondary | ICD-10-CM | POA: Diagnosis not present

## 2019-08-29 MED ORDER — IPRATROPIUM BROMIDE 0.03 % NA SOLN: 2.0000 | Freq: Two times a day (BID) | NASAL | 0 refills | Status: DC

## 2019-08-29 MED ORDER — CELECOXIB 200 MG PO CAPS: ORAL_CAPSULE | ORAL | 1 refills | Status: DC

## 2019-08-29 MED ORDER — BUPROPION HCL ER (XL) 150 MG PO TB24: 150.0000 mg | ORAL_TABLET | Freq: Every day | ORAL | 1 refills | Status: DC

## 2019-08-29 MED ORDER — AMOXICILLIN-POT CLAVULANATE 875-125 MG PO TABS: 1.0000 | ORAL_TABLET | Freq: Two times a day (BID) | ORAL | 0 refills | Status: DC

## 2019-08-29 MED FILL — CELECOXIB 200 MG CAPSULE: 200 | 90 days supply | Qty: 180 | Fill #0

## 2019-08-29 MED FILL — IPRATROPIUM 0.03% SPRAY: 0.03 | 25 days supply | Qty: 30 | Fill #0

## 2019-08-29 MED FILL — buPROPion HCL ER (XL) 150 M: 150 | 90 days supply | Qty: 90 | Fill #0

## 2019-08-29 MED FILL — AMOX-CLAV 875-125 MG TABLET: 875-125 | 7 days supply | Qty: 14 | Fill #0

## 2019-08-29 MED FILL — AZELASTINE HCL 0.05 % SOLN: 0.05 | 13 days supply | Qty: 6 | Fill #1

## 2019-08-29 MED FILL — LEVOCETIRIZINE 5 MG TABLET: 5 | 90 days supply | Qty: 90 | Fill #1

## 2019-08-29 MED FILL — CYCLOBENZAPRINE HCL 5 MG TA: 5 | 30 days supply | Qty: 30 | Fill #0

## 2019-08-29 NOTE — Progress Notes (Signed)
Virtual Visit via Video Note  I connected with Melanie [name] on 08/29/19 at  1:00 PM EST by a video enabled telemedicine application and verified that I am speaking with the correct person using two identifiers.   I discussed the limitations of evaluation and management by telemedicine and the availability of in person appointments. The patient expressed understanding and agreed to proceed.  Subjective:    CC: Sinus congestion  HPI: 54 year old female presenting via [facility] with complaints of sinus congestion, headaches, ear fullness, post nasal drip, and dry/itchy eyes for the past couple of weeks.  Small amount of clear nasal drainage but reports her sinuses feel full with increased pressure. Was Covid tested on January 15 with negative result.  Has since then received both her Covid vaccines.  She has tried [facility] and Sudafed with minimal relief.   ADHD-reports taking Adderall approximately once a week or less because she does not like how this medication makes her feel.  Reports it does help her focus but makes her feel horrible when it begins to wear off.  Interested in switching to Vyvanse as she is heard this is well-tolerated.  Depression-not currently on depression medication but has been seen by psychiatry and counseling.  Reports increase in depressive symptoms with job stress, seasonal changes, and Covid pandemic concerns.  Little interest in doing things.  Was treated with Wellbutrin successfully in the past and is interested in restarting this.  No SI/HI.  Taking Celebrex twice daily that helps manage her pain well.  Would like to have refills switched to 90-day supply.  Past medical history, Surgical history, Family history not pertinant except as noted below, Social history, Allergies, and medications have been entered into the medical record, reviewed, and corrections made.   Review of Systems: No fevers, chills, night sweats, weight loss, chest pain, or shortness of  breath.   Objective:    General: Speaking clearly in complete sentences without any shortness of breath.  Alert and oriented x3.  Normal judgment. No apparent acute distress.    Impression and Recommendations:    Acute sinusitis Treating empirically with Augmentin twice daily x7 days.  Atrovent nasal spray twice daily as needed.  May continue to use OTC Sudafed but monitor blood pressure closely for elevations.  Depression Restart Wellbutrin XL 150 mg daily.  ADHD Discussed changing medications from Adderall to Vyvanse.  Would like to get depressive symptoms under control prior to doing this so that we are able to adequately titrate her medications.  Patient agrees with plan.  DDD, cervical Continue Celebrex 200 mg 1 to 2 tablets daily as needed for pain.  90-day refill provided.  Return in about 4 weeks (around 09/26/2019) for Depression/ADHD follow-up.  I discussed the assessment and treatment plan with the patient. The patient was provided an opportunity to ask questions and all were answered. The patient agreed with the plan and demonstrated an understanding of the instructions.   The patient was advised to call back or seek an in-person evaluation if the symptoms worsen or if the condition fails to improve as anticipated.  35 minutes of non-face-to-face time was provided during this encounter.  Clearnce Sorrel, DNP, APRN, FNP-BC [facility]

## 2019-09-26 ENCOUNTER — Telehealth (INDEPENDENT_AMBULATORY_CARE_PROVIDER_SITE_OTHER): Payer: No Typology Code available for payment source | Admitting: Medical-Surgical

## 2019-09-26 ENCOUNTER — Encounter: Payer: Self-pay | Admitting: Medical-Surgical

## 2019-09-26 DIAGNOSIS — Z9109 Other allergy status, other than to drugs and biological substances: Secondary | ICD-10-CM | POA: Diagnosis not present

## 2019-09-26 DIAGNOSIS — F331 Major depressive disorder, recurrent, moderate: Secondary | ICD-10-CM | POA: Diagnosis not present

## 2019-09-26 DIAGNOSIS — F900 Attention-deficit hyperactivity disorder, predominantly inattentive type: Secondary | ICD-10-CM | POA: Diagnosis not present

## 2019-09-26 MED ORDER — OLOPATADINE HCL 0.2 % OP SOLN: OPHTHALMIC | 0 refills | Status: DC

## 2019-09-26 MED ORDER — LISDEXAMFETAMINE DIMESYLATE 30 MG PO CAPS: 30.0000 mg | ORAL_CAPSULE | Freq: Every day | ORAL | 0 refills | Status: DC

## 2019-09-26 MED ORDER — IPRATROPIUM BROMIDE 0.03 % NA SOLN: 2.0000 | Freq: Two times a day (BID) | NASAL | 0 refills | Status: DC

## 2019-09-26 MED FILL — VYVANSE 30 MG CAPSULE: 30 | 30 days supply | Qty: 30 | Fill #0

## 2019-09-26 MED FILL — IPRATROPIUM 0.03% SPRAY: 0.03 | 43 days supply | Qty: 30 | Fill #0

## 2019-09-26 MED FILL — OLOPATADINE HCL 0.2 % SOLN: 0.2 | 25 days supply | Qty: 3 | Fill #0

## 2019-09-26 NOTE — Progress Notes (Signed)
Virtual Visit via Video Note  I connected with Melanie [name] on 09/26/19 at  1:00 PM EDT by a video enabled telemedicine application and verified that I am speaking with the correct person using two identifiers.   I discussed the limitations of evaluation and management by telemedicine and the availability of in person appointments. The patient expressed understanding and agreed to proceed.  Subjective:    CC: Depression and ADHD follow-up  HPI: Pleasant 54 year old female presenting today via [facility] video visit for depression and ADHD follow-up.  Depression-taking Wellbutrin 150 mg daily.  Feels that this has helped with her depression symptoms some.  She did start a few days after her appointment so she has been on this a little over 3 weeks.  Tolerating well without side effects.  Does feel that this medication has made her more in touch with her emotions and reports that she did cry some a couple of days.  Thinks that for the first week or 2 she was a little more irritable than usual but this has resolved.  Would like to continue this medication.  Not attending any counseling at this time but has in the past.  Would like a new referral to reconnect with the therapist from this location named Stanton Kidney.  ADHD-prescribed Adderall but does not like the way this makes her feel as the dose wears off.  Avoids taking it and only takes it if absolutely necessary.  Would like to try Vyvanse.  Environmental allergies-exacerbation of environmental allergies for the last week or so.  Reports that she has eyedrops for this but they burn with administration.  Would like to try different allergy eyedrops if available.  Also reports that Atrovent nasal spray prescribed 1 month prior has tremendously helped with her nasal congestion and rhinorrhea.  Would like to have a refill on this medication if possible.  Past medical history, Surgical history, Family history not pertinant except as noted below,  Social history, Allergies, and medications have been entered into the medical record, reviewed, and corrections made.   Review of Systems: No fevers, chills, night sweats, weight loss, chest pain, or shortness of breath.   Objective:    General: Speaking clearly in complete sentences without any shortness of breath.  Alert and oriented x3.  Normal judgment. No apparent acute distress.   Impression and Recommendations:    1. Moderate episode of recurrent major depressive disorder (HCC) Discussed increasing dose of Wellbutrin to 300 mg daily and risks and benefits associated with higher dose of the medication.  Patient will stay at 150 mg daily for now but may decide to try 384m daily to see if this improves her depression symptoms.  Referral to behavioral health for establishing a counseling relationship with her prior counselor. - Ambulatory referral to [facility] 2. Attention deficit hyperactivity disorder (ADHD), predominantly inattentive type Discontinue Adderall.  Start Vyvanse 30 mg daily. - lisdexamfetamine (VYVANSE) 30 MG capsule; Take 1 capsule (30 mg total) by mouth daily.  Dispense: 30 capsule; Refill: 0  3. Environmental allergies Continue Atrovent nasal spray twice daily as needed, refill provided.  Sending in Pat[place] eyedrops to use 1 drop each eye daily. - ipratropium (ATROVENT) 0.03 % nasal spray; Place 2 sprays into both nostrils every 12 (twelve) hours.  Dispense: 30 mL; Refill: 0 - Olopatadine HCl 0.2 % SOLN; Place 1 drop to each eye once daily.  Dispense: 2.5 mL; Refill: 0  I discussed the assessment and treatment plan with the patient. The  patient was provided an opportunity to ask questions and all were answered. The patient agreed with the plan and demonstrated an understanding of the instructions.   The patient was advised to call back or seek an in-person evaluation if the symptoms worsen or if the condition fails to improve as anticipated.  Return in  about 4 weeks (around 10/24/2019) for ADHD, depression follow-up.   30 minutes of non-face-to-face time was provided during this encounter.   Clearnce Sorrel, DNP, APRN, FNP-BC [facility]

## 2019-10-04 MED FILL — CYCLOBENZAPRINE HCL 5 MG TA: 5 | 30 days supply | Qty: 30 | Fill #0

## 2019-10-08 ENCOUNTER — Other Ambulatory Visit: Payer: Self-pay | Admitting: Osteopathic Medicine

## 2019-10-08 MED FILL — HYDROCHLOROTHIAZIDE 25 MG T: 25 | 90 days supply | Qty: 90 | Fill #0

## 2019-10-09 ENCOUNTER — Other Ambulatory Visit: Payer: Self-pay

## 2019-10-09 DIAGNOSIS — Z9109 Other allergy status, other than to drugs and biological substances: Secondary | ICD-10-CM

## 2019-10-09 MED ORDER — LEVOCETIRIZINE DIHYDROCHLORIDE 5 MG PO TABS: 5.0000 mg | ORAL_TABLET | Freq: Every evening | ORAL | 1 refills | Status: DC

## 2019-11-11 ENCOUNTER — Other Ambulatory Visit: Payer: Self-pay | Admitting: Medical-Surgical

## 2019-11-11 ENCOUNTER — Other Ambulatory Visit: Payer: Self-pay

## 2019-11-11 DIAGNOSIS — F900 Attention-deficit hyperactivity disorder, predominantly inattentive type: Secondary | ICD-10-CM

## 2019-11-11 NOTE — Telephone Encounter (Signed)
Pt called requesting a refill of Vyvanse. Pt states it is working well for her without SE/AE, but would like to know about getting the dose increased. Pt is not scheduled for 4 week f/up as instructed at 09/26/2019 video visit. Call transferred to the front desk for scheduling in which I told her that would be discussed when she comes in for her appt. Call transferred to front desk for scheduling.

## 2019-11-12 ENCOUNTER — Other Ambulatory Visit: Payer: Self-pay | Admitting: Sports Medicine

## 2019-11-12 DIAGNOSIS — M5030 Other cervical disc degeneration, unspecified cervical region: Secondary | ICD-10-CM

## 2019-11-12 MED ORDER — CYCLOBENZAPRINE HCL 5 MG PO TABS: 5.0000 mg | ORAL_TABLET | Freq: Every day | ORAL | 3 refills | Status: DC

## 2019-11-13 ENCOUNTER — Other Ambulatory Visit: Payer: Self-pay

## 2019-11-13 ENCOUNTER — Other Ambulatory Visit: Payer: Self-pay | Admitting: *Deleted

## 2019-11-13 ENCOUNTER — Ambulatory Visit (INDEPENDENT_AMBULATORY_CARE_PROVIDER_SITE_OTHER): Payer: No Typology Code available for payment source | Admitting: Sports Medicine

## 2019-11-13 DIAGNOSIS — M170 Bilateral primary osteoarthritis of knee: Secondary | ICD-10-CM | POA: Diagnosis not present

## 2019-11-13 DIAGNOSIS — M5030 Other cervical disc degeneration, unspecified cervical region: Secondary | ICD-10-CM

## 2019-11-13 MED ORDER — GABAPENTIN 300 MG PO CAPS: ORAL_CAPSULE | ORAL | 3 refills | Status: DC

## 2019-11-13 NOTE — Progress Notes (Signed)
    Procedures performed today:    None.  Independent interpretation of notes and tests performed by another provider:   MRI from summer 2020 personally reviewed, cervical spine, multilevel DDD with clear right C6-C7 foraminal stenosis.  Brief History, Exam, Impression, and Recommendations:    Primary osteoarthritis of both knees Beatris returns, she has bilateral knee osteoarthritis with recurrence of pain in her right knee, we last did an injection in December 2020 and she did well until recently. Repeat right knee injection today.  DDD (degenerative disc disease), cervical Leonardo has a history of cervical radiculitis, historically in a right-sided C7 distribution, we added physical therapy, prednisone, Celebrex back in September of last year, now having recurrence of symptoms, worse at night, adding gabapentin, home rehab exercises, return to see me in a month, epidural if no better, she does have an MRI from the summer 2020.    ___________________________________________ [name]. [name], [name], ABFM., CAQSM. Primary Care and [place] [employer] of [place] [employer] [facility] [employer] [facility]

## 2019-11-13 NOTE — Assessment & Plan Note (Signed)
Melanie [name] returns, she has bilateral knee osteoarthritis with recurrence of pain in her right knee, we last did an injection in December 2020 and she did well until recently. Repeat right knee injection today.

## 2019-11-13 NOTE — Assessment & Plan Note (Signed)
Melanie [name] has a history of cervical radiculitis, historically in a right-sided C7 distribution, we added physical therapy, prednisone, Celebrex back in September of last year, now having recurrence of symptoms, worse at night, adding gabapentin, home rehab exercises, return to see me in a month, epidural if no better, she does have an MRI from the summer 2020.

## 2019-11-14 NOTE — Progress Notes (Signed)
Virtual Visit via Video Note  I connected with [name] on 11/15/19 at  1:20 PM EDT by a video enabled telemedicine application and verified that I am speaking with the correct person using two identifiers.   I discussed the limitations of evaluation and management by telemedicine and the availability of in person appointments. The patient expressed understanding and agreed to proceed.  Subjective:    CC: ADHD and depression follow up  HPI: Pleasant 54 year old female presenting via [place] video visit for follow-up on ADHD and depression/anxiety.  ADHD-taking Vyvanse 30 mg daily, tolerating very well without side effects except for dry mouth.  Good appetite and is able to eat regularly.  Ran out of Vyvanse, so she ended up taking an Adderall.  Did not tolerate this medicine well at all and realized how badly it affects her.  Does feel that she could benefit from a slightly increased dose of the Vyvanse.  She reports she picked up her new Vyvanse prescription yesterday, is okay with starting new higher dose in 1 month.  Depression/anxiety-taking Wellbutrin 150 mg daily.  Feels this does work fairly well with her and is tolerating the medication without side effects.  Previous discussion regarding increasing dose to 300 mg a day but she has not done this so far.  She feels that that is a very big dose jump from 150 to 300 mg and has been hesitant to do this.  Was contacted by behavioral health to reestablish a counseling relationship but that there were several things going on and she did not call them back to get scheduled.  Denies SI/HI.  Cervical DDD-reports she picked up her gabapentin prescription yesterday and started taking the medication last night.  Notes that she did "feel funny" this morning but this is since resolved.  Colon cancer screening-reports she had opted to do the Cologuard for colon cancer screening and was sent to the kit.  She does not remember what date this  was but she never completed the testing.  Is wondering if the test is still good or what she needs to do to be able to complete this now.  Past medical history, Surgical history, Family history not pertinant except as noted below, Social history, Allergies, and medications have been entered into the medical record, reviewed, and corrections made.   Review of Systems: See HPI for pertinent positives and negatives.   Objective:    General: Speaking clearly in complete sentences without any shortness of breath.  Alert and oriented x3.  Normal judgment. No apparent acute distress.  Impression and Recommendations:    1. Attention deficit hyperactivity disorder (ADHD), predominantly inattentive type Continue Vyvanse 30 mg daily for this prescription.  We will increase Vyvanse to 40 mg daily for the next month.   2. GAD (generalized anxiety disorder)/depression Discussed increasing Wellbutrin versus maintaining current dose.  Patient reports that she may try Wellbutrin 150 mg twice daily.  She will let me know if this works better for her or if she would like to stay at the 150 mg dose daily.  Advised patient that she is a current patient with the behavioral health group as she was seen in 09/2018 so if she would like to have an appointment all she will have to do is call and schedule that.  3.  Cervical DDD Continue gabapentin as prescribed and discussed with [name].  If having difficulty tolerating the medications, recommend discussing this further with [name].  4.  Colon cancer  screening Advised patient to contact the number included with the Cologuard kit.  They should be able to answer questions regarding the date she was sent the kit as well as the appropriate timeframe for use.  Return in about 8 weeks (around 01/10/2020) for ADHD/depression follow-up.  30 minutes of non-face-to-face time was provided during this encounter.  I discussed the assessment and treatment plan with the patient. The  patient was provided an opportunity to ask questions and all were answered. The patient agreed with the plan and demonstrated an understanding of the instructions.   The patient was advised to call back or seek an in-person evaluation if the symptoms worsen or if the condition fails to improve as anticipated.  Clearnce Sorrel, DNP, APRN, FNP-BC [facility]

## 2019-11-15 ENCOUNTER — Other Ambulatory Visit: Payer: Self-pay

## 2019-11-15 ENCOUNTER — Telehealth (INDEPENDENT_AMBULATORY_CARE_PROVIDER_SITE_OTHER): Payer: No Typology Code available for payment source | Admitting: Medical-Surgical

## 2019-11-15 ENCOUNTER — Encounter: Payer: Self-pay | Admitting: Medical-Surgical

## 2019-11-15 ENCOUNTER — Telehealth: Payer: No Typology Code available for payment source | Admitting: Medical-Surgical

## 2019-11-15 DIAGNOSIS — F411 Generalized anxiety disorder: Secondary | ICD-10-CM

## 2019-11-15 DIAGNOSIS — F331 Major depressive disorder, recurrent, moderate: Secondary | ICD-10-CM

## 2019-11-15 DIAGNOSIS — M5030 Other cervical disc degeneration, unspecified cervical region: Secondary | ICD-10-CM

## 2019-11-15 DIAGNOSIS — F900 Attention-deficit hyperactivity disorder, predominantly inattentive type: Secondary | ICD-10-CM

## 2019-11-29 ENCOUNTER — Telehealth (INDEPENDENT_AMBULATORY_CARE_PROVIDER_SITE_OTHER): Payer: No Typology Code available for payment source | Admitting: Medical-Surgical

## 2019-11-29 ENCOUNTER — Encounter: Payer: Self-pay | Admitting: Medical-Surgical

## 2019-11-29 DIAGNOSIS — B349 Viral infection, unspecified: Secondary | ICD-10-CM | POA: Diagnosis not present

## 2019-11-29 MED ORDER — GUAIFENESIN-CODEINE 100-10 MG/5ML PO SYRP: 5.0000 mL | ORAL_SOLUTION | Freq: Three times a day (TID) | ORAL | 0 refills | Status: DC | PRN

## 2019-11-29 NOTE — Progress Notes (Signed)
Virtual Visit via Video Note  I connected with Melanie [name] on 11/29/19 at  3:00 PM EDT by a video enabled telemedicine application and verified that I am speaking with the correct person using two identifiers.   I discussed the limitations of evaluation and management by telemedicine and the availability of in person appointments. The patient expressed understanding and agreed to proceed.  Patient location: home Provider locations: office  Subjective:    CC: upper respiratory symptoms  HPI: Pleasant 54 year old female presenting via MyChart video visit with complaints of 2-3 days of sore throat, PND, HA, fatigue, nausea, sinus congestion, ear fullness, and cough productive of small amounts of light yellow mucus. Cough not worse at any time of the day, not keeping her awake at night. Noted to have slight burning in the chest when coughing. No fever or chills but has noted her cheeks have occasionally been flushed. Was feeling some better yesterday but the cough is worse today.  Has been out of town recently. No known sick contacts. Completed her COVID vaccines. Has been using Atrovent nasal spray that helps with rhinorrhea/sinus congestion. Also using Tylenol with minimal relief of symptoms.   Past medical history, Surgical history, Family history not pertinant except as noted below, Social history, Allergies, and medications have been entered into the medical record, reviewed, and corrections made.   Review of Systems: See HPI for pertinent positives and negatives.   Objective:    General: Speaking clearly in complete sentences without any shortness of breath.  Alert and oriented x3.  Normal judgment. No apparent acute distress.  Impression and Recommendations:    1. Viral syndrome Has completed COVID vaccination but not unreasonable to be tested for COVID. Recommend supportive treatment for symptoms (see list below). Sending in Robitussin [facility] as needed for use at night. May use  Tessalon perls (on hand) as needed. May benefit from Mucinex. Increase rest and hydration.   Return if symptoms worsen or fail to improve.  15 minutes of non-face-to-face time was provided during this encounter.  I discussed the assessment and treatment plan with the patient. The patient was provided an opportunity to ask questions and all were answered. The patient agreed with the plan and demonstrated an understanding of the instructions.   The patient was advised to call back or seek an in-person evaluation if the symptoms worsen or if the condition fails to improve as anticipated.  Clearnce Sorrel, DNP, APRN, FNP-BC [facility]  Medications & Home Remedies for Upper Respiratory Illness   Note: the following list assumes no pregnancy, normal liver & kidney function and no other drug interactions. Always ask a pharmacist or qualified medical provider if you have any questions!    Aches/Pains, Fever, Headache OTC Acetaminophen (Tylenol) 500 mg tablets - take max 2 tablets (1000 mg) every 6 hours (4 times per day)  OTC Ibuprofen (Motrin) 200 mg tablets - take max 4 tablets (800 mg) every 6 hours*   Sinus Congestion Prescription Atrovent as directed OTC Nasal Saline if desired to rinse OTC Oxymetolazone (Afrin, others) sparing use due to rebound congestion, NEVER use in kids OTC Phenylephrine (Sudafed) 10 mg tablets every 4 hours (or the 12-hour formulation)* OTC Diphenhydramine (Benadryl) 25 mg tablets - take max 2 tablets every 4 hours   Cough & Sore Throat Prescription cough pills or syrups as directed  OTC Dextromethorphan (Robitussin, others) - cough suppressant OTC Guaifenesin (Robitussin, Mucinex, others) - expectorant (helps cough  up mucus) (Dextromethorphan and Guaifenesin also come in a combination tablet/syrup) OTC Lozenges w/ Benzocaine + Menthol (Cepacol) Honey - as much as you want! Teas which "coat the throat" -  look for ingredients Elm Bark, Licorice Root, Marshmallow Root   Other   OTC Zinc Lozenges within 24 hours of symptoms onset - mixed evidence this shortens the duration of the common cold Don't waste your money on Vitamin C or Echinacea in acute illness - it's already too late!    *Caution in patients with high blood pressure

## 2019-12-05 ENCOUNTER — Other Ambulatory Visit: Payer: Self-pay

## 2019-12-05 ENCOUNTER — Other Ambulatory Visit: Payer: Self-pay | Admitting: Medical-Surgical

## 2019-12-05 DIAGNOSIS — Z9109 Other allergy status, other than to drugs and biological substances: Secondary | ICD-10-CM

## 2019-12-05 DIAGNOSIS — F900 Attention-deficit hyperactivity disorder, predominantly inattentive type: Secondary | ICD-10-CM

## 2019-12-05 DIAGNOSIS — F331 Major depressive disorder, recurrent, moderate: Secondary | ICD-10-CM

## 2019-12-05 MED ORDER — BUPROPION HCL ER (XL) 150 MG PO TB24: 300.0000 mg | ORAL_TABLET | Freq: Every day | ORAL | 0 refills | Status: DC

## 2019-12-05 MED ORDER — LISDEXAMFETAMINE DIMESYLATE 40 MG PO CAPS: 40.0000 mg | ORAL_CAPSULE | Freq: Every day | ORAL | 0 refills | Status: DC

## 2019-12-05 MED FILL — buPROPion HCL ER (XL) 150 M: 150 | 90 days supply | Qty: 180 | Fill #0

## 2019-12-05 MED FILL — CYCLOBENZAPRINE HCL 5 MG TA: 5 | 90 days supply | Qty: 90 | Fill #0

## 2019-12-05 MED FILL — VYVANSE 40 MG CAPSULE: 40 | 30 days supply | Qty: 30 | Fill #0

## 2019-12-05 MED FILL — IPRATROPIUM 0.03% SPRAY: 0.03 | 43 days supply | Qty: 30 | Fill #0

## 2019-12-05 MED FILL — CELECOXIB 200 MG CAP: 200 | 90 days supply | Qty: 180 | Fill #0

## 2019-12-05 MED FILL — OLOPATADINE HCL 0.2 % SOLN: 0.2 | 25 days supply | Qty: 3 | Fill #0

## 2019-12-05 MED FILL — LEVOCETIRIZINE 5 MG TABLET: 5 | 90 days supply | Qty: 90 | Fill #0

## 2019-12-05 NOTE — Telephone Encounter (Signed)
Pt requesting refills of Vyvanse and Wellbutrin XR. At last virtual visit with Joy, the Vyvanse was going to be increased to 40 mg when it was time for renewal. Pt was to increase the Wellbutrin to 300 mg daily and let us know how it worked for her. She has increased her meds but is not certain she wants to stay at the 300 mg dose. Requesting the Rx be refilled at the 150 mg dose, 2 qd. Pt is also requesting #90 day supplies.

## 2019-12-06 NOTE — Telephone Encounter (Signed)
Patient aware of via voicemail that her Rx's were sent to the pharmacy late yesterday afternoon. Instructed patient to call back if there are any questions or concerns.

## 2019-12-12 ENCOUNTER — Ambulatory Visit: Payer: No Typology Code available for payment source | Admitting: Sports Medicine

## 2020-01-03 ENCOUNTER — Other Ambulatory Visit: Payer: Self-pay

## 2020-01-03 DIAGNOSIS — F900 Attention-deficit hyperactivity disorder, predominantly inattentive type: Secondary | ICD-10-CM

## 2020-01-03 MED ORDER — LISDEXAMFETAMINE DIMESYLATE 40 MG PO CAPS: 40.0000 mg | ORAL_CAPSULE | Freq: Every day | ORAL | 0 refills | Status: DC

## 2020-01-03 MED FILL — VYVANSE 40 MG CAPSULE: 40 | 30 days supply | Qty: 30 | Fill #0

## 2020-01-03 NOTE — Telephone Encounter (Signed)
Pt called and LVM requesting a refill of Vyvanse 40 mg. Pt states the dosage was increased to 40 mg and she is tolerating it well and is more helpful. Pharmacy verified

## 2020-01-03 NOTE — Telephone Encounter (Signed)
Pt aware Rx has been sent to the pharmacy. No further questions or concerns at this time.

## 2020-01-27 ENCOUNTER — Other Ambulatory Visit: Payer: Self-pay | Admitting: Medical-Surgical

## 2020-01-27 DIAGNOSIS — F900 Attention-deficit hyperactivity disorder, predominantly inattentive type: Secondary | ICD-10-CM

## 2020-01-27 MED ORDER — LISDEXAMFETAMINE DIMESYLATE 40 MG PO CAPS: 40.0000 mg | ORAL_CAPSULE | Freq: Every day | ORAL | 0 refills | Status: DC

## 2020-01-27 NOTE — Telephone Encounter (Signed)
Per Joy's note:   Return in about 8 weeks (around 01/10/2020) for ADHD/depression follow-up [w/ PCP]   OK to refill 30 days but needs f/u

## 2020-01-27 NOTE — Telephone Encounter (Signed)
Routing to PCP

## 2020-01-27 NOTE — Telephone Encounter (Signed)
Patient called and left a message for a refill of her Vyvanse. I see she had a visit with you in May. Dr. Sheppard Coil is PCP. Please advise.

## 2020-01-29 NOTE — Telephone Encounter (Signed)
Left brief message for patient that PCP has sent in a prescription to the pharmacy and to call back for a follow up to get further refills. Patient was asked to call the office to make an appointment.

## 2020-02-07 ENCOUNTER — Other Ambulatory Visit: Payer: Self-pay | Admitting: Medical-Surgical

## 2020-02-07 DIAGNOSIS — Z9109 Other allergy status, other than to drugs and biological substances: Secondary | ICD-10-CM

## 2020-02-07 MED FILL — OLOPATADINE HCL 0.2 % SOLN: 0.2 | 25 days supply | Qty: 3 | Fill #0

## 2020-02-07 MED FILL — VYVANSE 40 MG CAPSULE: 40 | 30 days supply | Qty: 30 | Fill #0

## 2020-02-25 ENCOUNTER — Ambulatory Visit (INDEPENDENT_AMBULATORY_CARE_PROVIDER_SITE_OTHER): Payer: No Typology Code available for payment source | Admitting: Medical-Surgical

## 2020-02-25 ENCOUNTER — Encounter: Payer: Self-pay | Admitting: Medical-Surgical

## 2020-02-25 ENCOUNTER — Other Ambulatory Visit: Payer: Self-pay | Admitting: Medical-Surgical

## 2020-02-25 VITALS — BP 138/96 | HR 82 | Temp 98.4°F | Ht 67.0 in | Wt 222.3 lb

## 2020-02-25 DIAGNOSIS — F900 Attention-deficit hyperactivity disorder, predominantly inattentive type: Secondary | ICD-10-CM | POA: Diagnosis not present

## 2020-02-25 DIAGNOSIS — M5030 Other cervical disc degeneration, unspecified cervical region: Secondary | ICD-10-CM

## 2020-02-25 DIAGNOSIS — G479 Sleep disorder, unspecified: Secondary | ICD-10-CM | POA: Diagnosis not present

## 2020-02-25 DIAGNOSIS — G4733 Obstructive sleep apnea (adult) (pediatric): Secondary | ICD-10-CM

## 2020-02-25 DIAGNOSIS — M79601 Pain in right arm: Secondary | ICD-10-CM

## 2020-02-25 DIAGNOSIS — Z1211 Encounter for screening for malignant neoplasm of colon: Secondary | ICD-10-CM

## 2020-02-25 MED ORDER — TRAZODONE HCL 50 MG PO TABS: 25.0000 mg | ORAL_TABLET | Freq: Every evening | ORAL | 0 refills | Status: DC | PRN

## 2020-02-25 MED ORDER — LISDEXAMFETAMINE DIMESYLATE 50 MG PO CAPS: 50.0000 mg | ORAL_CAPSULE | Freq: Every day | ORAL | 0 refills | Status: DC

## 2020-02-25 MED FILL — traZODone HCL 50 MG TABS: 50 | 30 days supply | Qty: 30 | Fill #0

## 2020-02-25 MED FILL — LEVOCETIRIZINE 5 MG TABLET: 5 | 90 days supply | Qty: 90 | Fill #1

## 2020-02-25 MED FILL — CYCLOBENZAPRINE HCL 5 MG TA: 5 | 90 days supply | Qty: 90 | Fill #1

## 2020-02-25 MED FILL — VYVANSE 50 MG CAPSULE: 50 | 30 days supply | Qty: 30 | Fill #0

## 2020-02-25 NOTE — Progress Notes (Signed)
Subjective:    CC: ADHD follow up  HPI:  ADHD: Taking Vyvanse 40 mg daily, tolerating well without major side effects.  Does note slight dry mouth on this medication.  Feels that 40 mg is working fairly well but has noticed some mild irritation in the evenings that is intermittent.  Since the 40 mg is working fairly well and she is tolerating the medication, she is interested in increasing to 50 mg to see if this would help her further.  Right arm pain: Has been experiencing some right arm pain/numbness/tingling for a while.  She notes that she is bad about doing her physical therapy exercises as she is supposed to but she has been splinting as instructed.  MRI in 2020 showed significant cervical spine changes.  She was previously told she may need to consider epidural injections in the cervical spine and she feels like she is at a point of being ready to do that.  Previously prescribed gabapentin but admits that she did not give the medication of fair chance before stopping it.  She plans to talk with [name] regarding her options.  Sleep study: Known diagnosis of obstructive sleep apnea but her last sleep study was around 2011.  She has had 2 sleep studies before, one at [facility] and one at [facility].  She is currently using a CPAP at night but her machine is old and she has not had any update in settings.  She would like to have an updated sleep study, preferably one done at home.   STOP-BANG for SLEEP APNEA Do you Snore loudly? Yes Do you often feel Tired during day? Yes Has anyone Observed you stop breathing? Yes History of high blood Pressure? Yes BMI >35? No Age >50? Yes Neck circumference >16 in? Yes Gender female? No 5-8 = high risk 3-4 = intermediate 0-2 = low risk   Sleeping difficulty: Frequently has difficulty falling asleep at night.  She usually stays up until she is completely exhausted before being able to fall asleep.  If she goes to bed early, she does note difficulty in falling asleep  and lays there for hours.  She has tried melatonin, sleepy time tea, essential oils.  Takes a muscle relaxer at night which helps.  Has never tried any other prescription medication for sleep.  Previously took hydroxyzine for anxiety which was a bit sedating but she experienced some side effects when taking this medication.  I reviewed the past medical history, family history, social history, surgical history, and allergies today and no changes were needed.  Please see the problem list section below in epic for further details.  Past Medical History: History reviewed. No pertinent past medical history. Past Surgical History: Past Surgical History:  Procedure Laterality Date  . ABDOMINAL HYSTERECTOMY    . MYOMECTOMY     Social History: Social History   Socioeconomic History  . Marital status: Divorced    Spouse name: Not on file  . Number of children: Not on file  . Years of education: Not on file  . Highest education level: Not on file  Occupational History  . Not on file  Tobacco Use  . Smoking status: Never Smoker  . Smokeless tobacco: Never Used  Vaping Use  . Vaping Use: Never used  Substance and Sexual Activity  . Alcohol use: Yes    Alcohol/week: 1.0 standard drink    Types: 1 Cans of beer per week    Comment: occ  . Drug use: Never  .  Sexual activity: Not Currently    Birth control/protection: Surgical  Other Topics Concern  . Not on file  Social History Narrative  . Not on file   Social Determinants of Health   Financial Resource Strain:   . Difficulty of Paying Living Expenses: Not on file  Food Insecurity:   . Worried About Charity fundraiser in the Last Year: Not on file  . Ran Out of Food in the Last Year: Not on file  Transportation Needs:   . Lack of Transportation (Medical): Not on file  . Lack of Transportation (Non-Medical): Not on file  Physical Activity:   . Days of Exercise per Week: Not on file  . Minutes of Exercise per Session: Not on file   Stress:   . Feeling of Stress : Not on file  Social Connections:   . Frequency of Communication with Friends and Family: Not on file  . Frequency of Social Gatherings with Friends and Family: Not on file  . Attends Religious Services: Not on file  . Active Member of Clubs or Organizations: Not on file  . Attends Archivist Meetings: Not on file  . Marital Status: Not on file   Family History: Family History  Problem Relation Age of Onset  . Lung cancer Father   . Diabetes Mother   . Skin cancer Mother    Allergies: Allergies  Allergen Reactions  . Clarithromycin Other (See Comments) and Rash    Other Reaction: Fever  . Morphine Other (See Comments)    Other Reaction: mental status alerted  . Sulfa Antibiotics Hives   Medications: See med rec.  Review of Systems: No fevers, chills, night sweats, weight loss, chest pain, or shortness of breath.   Objective:    General: Well Developed, well nourished, and in no acute distress.  Neuro: Alert and oriented x3.  HEENT: Normocephalic, atraumatic.  Skin: Warm and dry. Cardiac: Regular rate and rhythm, no murmurs rubs or gallops, no lower extremity edema.  Respiratory: Clear to auscultation bilaterally. Not using accessory muscles, speaking in full sentences.  Impression and Recommendations:    1. Attention deficit hyperactivity disorder (ADHD), predominantly inattentive type We will go ahead and increase her Vyvanse to 50 mg daily.  She will fill this in the next couple of weeks when she runs out of her 40 mg dose.  If she is unable to tolerate the 50 mg dose, okay to reduce back to 40 mg daily but she would at least like to try for now. - lisdexamfetamine (VYVANSE) 50 MG capsule; Take 1 capsule (50 mg total) by mouth daily.  Dispense: 30 capsule; Refill: 0  2. Screening for colon cancer Referring to GI for colonoscopy. - Ambulatory referral to Gastroenterology  3. OSA (obstructive sleep apnea) Ordering a home  sleep apnea test through [facility].  Unsure if insurance will cover this with a known diagnosis of sleep apnea.  Continue using current CPAP.  She may need referral to a pulmonologist to evaluate her OSA and CPAP settings. - Home sleep test  4. Sleeping difficulty Discussed options for insomnia.  We will go ahead and start with trazodone 25-50 mg at bedtime as needed for sleep.  Advised trying the 25 mg dose first and if not helpful can increase to 50 mg.  Also discussed sleep hygiene measures. - traZODone (DESYREL) 50 MG tablet; Take 0.5-1 tablets (25-50 mg total) by mouth at bedtime as needed for sleep.  Dispense: 30 tablet; Refill: 0  5. Right arm pain Right arm pain, numbness, and tingling are likely related to cervical disc degeneration and other findings on her MRI.  Recommend discussing further options with [name].  If she does have an acute flare in the meantime, she may benefit from a burst dose of prednisone.  Also recommend retrying gabapentin as she did not take it for very long.  Return for sleep/ADHD follow up in 4-6 weeks. ___________________________________________ Clearnce Sorrel, DNP, APRN, FNP-BC Primary Care and Sports Medicine [place]

## 2020-02-26 ENCOUNTER — Other Ambulatory Visit: Payer: Self-pay | Admitting: Medical-Surgical

## 2020-02-26 DIAGNOSIS — M5030 Other cervical disc degeneration, unspecified cervical region: Secondary | ICD-10-CM

## 2020-02-27 ENCOUNTER — Telehealth: Payer: Self-pay | Admitting: Osteopathic Medicine

## 2020-02-27 NOTE — Telephone Encounter (Signed)
Received fax for PA on Vyvanse sent through cover my meds waiting on determination. - CF

## 2020-02-28 NOTE — Telephone Encounter (Signed)
Received a note from our referral coordinator regarding the order for the home sleep study through [facility]. Because the patient has the [place], she is required to use a [place] facility to complete this test. This will involve having it done at a sleep lab instead of at home. Called patient to notify her of this requirement. She is going to call her insurance to see if she can get approval to use [facility] so this can be completed at home. She will reach out to let us know if she gets approval or not. If not, the sleep study will need to be reordered to be completed at a [place] lab.

## 2020-03-02 MED FILL — VYVANSE 50 MG CAPSULE: 50 | 30 days supply | Qty: 30 | Fill #0

## 2020-03-02 MED FILL — CELECOXIB 200 MG CAP: 200 | 90 days supply | Qty: 180 | Fill #0

## 2020-03-03 NOTE — Telephone Encounter (Signed)
Received fax from [facility] stating Vyvanse does not require a PA after review they determined it is a covered benefit. - CF

## 2020-03-10 MED FILL — HYDROCHLOROTHIAZIDE 25 MG T: 25 | 90 days supply | Qty: 90 | Fill #1

## 2020-03-19 ENCOUNTER — Encounter: Payer: No Typology Code available for payment source | Admitting: Osteopathic Medicine

## 2020-03-25 ENCOUNTER — Ambulatory Visit: Payer: No Typology Code available for payment source | Admitting: Osteopathic Medicine

## 2020-03-26 ENCOUNTER — Other Ambulatory Visit: Payer: Self-pay

## 2020-03-26 ENCOUNTER — Ambulatory Visit (INDEPENDENT_AMBULATORY_CARE_PROVIDER_SITE_OTHER): Payer: No Typology Code available for payment source | Admitting: Sports Medicine

## 2020-03-26 ENCOUNTER — Ambulatory Visit (INDEPENDENT_AMBULATORY_CARE_PROVIDER_SITE_OTHER): Payer: No Typology Code available for payment source

## 2020-03-26 ENCOUNTER — Ambulatory Visit (INDEPENDENT_AMBULATORY_CARE_PROVIDER_SITE_OTHER): Payer: No Typology Code available for payment source | Admitting: Rehabilitative and Restorative Service Providers"

## 2020-03-26 DIAGNOSIS — M25511 Pain in right shoulder: Secondary | ICD-10-CM | POA: Diagnosis not present

## 2020-03-26 DIAGNOSIS — M542 Cervicalgia: Secondary | ICD-10-CM

## 2020-03-26 DIAGNOSIS — M6281 Muscle weakness (generalized): Secondary | ICD-10-CM | POA: Diagnosis not present

## 2020-03-26 DIAGNOSIS — M5030 Other cervical disc degeneration, unspecified cervical region: Secondary | ICD-10-CM | POA: Diagnosis not present

## 2020-03-26 DIAGNOSIS — M170 Bilateral primary osteoarthritis of knee: Secondary | ICD-10-CM | POA: Diagnosis not present

## 2020-03-26 DIAGNOSIS — R29898 Other symptoms and signs involving the musculoskeletal system: Secondary | ICD-10-CM

## 2020-03-26 MED ORDER — PREDNISONE 50 MG PO TABS: ORAL_TABLET | ORAL | 0 refills | Status: DC

## 2020-03-26 MED ORDER — GABAPENTIN 300 MG PO CAPS: ORAL_CAPSULE | ORAL | 3 refills | Status: DC

## 2020-03-26 MED FILL — GABAPENTIN 300 MG CAPSULE: 300 | 30 days supply | Qty: 90 | Fill #0

## 2020-03-26 MED FILL — predniSONE 50 MG TABS: 50 | 5 days supply | Qty: 5 | Fill #0

## 2020-03-26 NOTE — Assessment & Plan Note (Signed)
Good response to right knee injection approximately 4 months ago, now with recurrence of pain, we injected right knee today, return as needed for this.

## 2020-03-26 NOTE — Therapy (Signed)
[facility] [place], [place], [address] Phone: [phone]   Fax:  [phone]  Physical Therapy Evaluation  Patient Details  Name: Melanie [name] MRN: [id number] Date of Birth: [birth year] Referring Provider (PT): Aundria Mems, MD   Encounter Date: 03/26/2020   PT End of Session - 03/26/20 1825    Visit Number 1    Number of Visits 12    Date for PT Re-Evaluation 05/07/20    Authorization Type UMR    PT Start Time 1400    PT Stop Time 1450    PT Time Calculation (min) 50 min           No past medical history on file.  Past Surgical History:  Procedure Laterality Date  . ABDOMINAL HYSTERECTOMY    . MYOMECTOMY      There were no vitals filed for this visit.    Subjective Assessment - 03/26/20 1407    Subjective The patient reports she does not have good use of her right arm.  She notes a multi-year history of neck pain, R arm weakness, scapular pain.  "Now it is the whole extremity (pointing to right arm)".  She reports "I'm not a good PT student."  She is an Health visitor.  She generally has fluctuations in mobility.    Pertinent History HTN, hypothyroidism, ? fibromyalgia    Diagnostic tests Multilevel degenerative change in the cervical spine as above.Moderate right foraminal encroachment C5-6 and C6-7 due to spurring.Mild left foraminal encroachment C5-6 and C6-7 due to spurring.    Patient Stated Goals "Not be aware of my right arm constantly and improve range of motion."    Currently in Pain? Yes    Pain Score 7     Pain Location Arm    Pain Orientation Right    Pain Descriptors / Indicators Numbness;Tingling;Sharp;Shooting;Discomfort;Dull;Aching   dull, aching at rest/ sharp, shooting with movement   Pain Type Chronic pain    Pain Radiating Towards She gets numbness worse in hand    Pain Onset More than a month ago    Pain Frequency Constant    Aggravating Factors  severe pain with  reaching overhead, medial elbow pain    Pain Relieving Factors celebrex              [facility] PT Assessment - 03/26/20 1419      Assessment   Medical Diagnosis cervical DDD    Referring Provider (PT) Aundria Mems, MD    Onset Date/Surgical Date 03/26/20    Hand Dominance Right      Precautions   Precautions None      Restrictions   Weight Bearing Restrictions No      Balance Screen   Has the patient fallen in the past 6 months No    Has the patient had a decrease in activity level because of a fear of falling?  No    Is the patient reluctant to leave their home because of a fear of falling?  No      Home Ecologist residence    Living Arrangements Alone      Prior Function   Level of Independence Independent    Vocation Full time employment    Vocation Requirements ER nurse      Observation/Other Assessments   Focus on Therapeutic Outcomes (FOTO)  65% limitation      Sensation   Light Touch Impaired Detail    Light Touch  Impaired Details Impaired RUE   patient reports sensory changes into hand     Posture/Postural Control   Posture/Postural Control Postural limitations    Postural Limitations Rounded Shoulders;Forward head      ROM / Strength   AROM / PROM / Strength AROM;Strength      AROM   Overall AROM  Deficits    AROM Assessment Site Cervical;Shoulder    Right/Left Shoulder Right;Left    Right Shoulder Flexion 140 Degrees   severe pain R shoulder   Right Shoulder ABduction 100 Degrees   tightness and pain in R biceps region   Left Shoulder Flexion 160 Degrees    Left Shoulder ABduction 160 Degrees    Cervical Flexion 35    Cervical Extension 38   pain radiates into R shoulder and arm   Cervical - Right Side Bend 30    Cervical - Left Side Bend 32    Cervical - Right Rotation 65    Cervical - Left Rotation 58      Strength   Overall Strength Deficits    Strength Assessment Site Shoulder;Hand    Right/Left  Shoulder Right;Left    Right Shoulder Flexion 4-/5    Right Shoulder ABduction 4-/5    Left Shoulder Flexion 5/5    Left Shoulder ABduction 5/5    Right/Left hand Right;Left    Right Hand Grip (lbs) 55    Left Hand Grip (lbs) 75      Flexibility   Soft Tissue Assessment /Muscle Length yes   tightness anterior pec R upper quadrant     Palpation   Palpation comment myofascial trigger points noted in R pectoralis, upper trap, scalenes, levator, parascapular musculature, biceps                      Objective measurements completed on examination: See above findings.       Funk Adult PT Treatment/Exercise - 03/26/20 1419      Exercises   Exercises Shoulder      Shoulder Exercises: Supine   Other Supine Exercises T stretch *patient develops numbness in hand       Manual Therapy   Manual Therapy Soft tissue mobilization    Manual therapy comments skilled palpation with dry needling and STM    Soft tissue mobilization upper trap, scalenes, anterior shoulder/ pec            Trigger Point Dry Needling - 03/26/20 1829    Consent Given? Yes    Education Handout Provided No   has had in the past   Muscles Treated Head and Neck Upper trapezius    Muscles Treated Upper Quadrant --    Upper Trapezius Response Twitch reponse elicited    Pectoralis Major Response --                PT Education - 03/26/20 1824    Education Details dry needling education provided *to give handout next visit (has had in the past)    Person(s) Educated Patient    Methods Explanation    Comprehension Verbalized understanding               PT Long Term Goals - 03/26/20 1825      PT LONG TERM GOAL #1   Title The patient will be indep with HEP.    Time 6    Period Weeks    Target Date 05/07/20      PT LONG TERM GOAL #2  Title The patient will reduce functional limitation per FOTO from 65% to < or equal to 47%.    Time 6    Period Weeks    Target Date 05/07/20       PT LONG TERM GOAL #3   Title The patient will reduce pain from m7/10 to < or equal to 5/10.    Time 6    Period Weeks    Target Date 05/07/20      PT LONG TERM GOAL #4   Title The patient will improve grip strength R hand to equal L.    Time 6    Period Weeks    Target Date 05/07/20                  Plan - 03/26/20 1452    Clinical Impression Statement The patient is a 54 yo female with chronic h/o R C7 radiculopathy and R UE/upper quadrant pain.  She presents with impairments in UE ROM, cervical spine ROM, muscle strength, flexibility, myofascial tightness leading to dec'd ADL and IADL performance.  Patient also notes awareness of pain in every aspect of her day.  PT to address deficits to reduce pain and improve mobility.    Personal Factors and Comorbidities Comorbidity 2    Comorbidities HTN, hypothyroidism    Examination-Activity Limitations Reach Overhead;Lift;Carry    Examination-Participation Restrictions Occupation;Cleaning;Community Activity    Stability/Clinical Decision Making Stable/Uncomplicated    Clinical Decision Making Low    Rehab Potential Good    PT Frequency 2x / week    PT Duration 6 weeks    PT Treatment/Interventions ADLs/Self Care Home Management;Patient/family education;Therapeutic activities;Therapeutic exercise;Electrical Stimulation;Traction;Moist Heat;Cryotherapy;Taping;Dry needling;Manual techniques;Iontophoresis 28m/ml Dexamethasone    PT Next Visit Plan Initiate HEP for gentle AROM c-spine and shoulder, postural stabilization to tolerance, traction trial (stay near to assess t/o treatment), dry needling, STM, modalities as needed.    Consulted and Agree with Plan of Care Patient           Patient will benefit from skilled therapeutic intervention in order to improve the following deficits and impairments:  Pain, Decreased range of motion, Decreased strength, Impaired flexibility, Hypomobility, Impaired sensation, Postural dysfunction,  Decreased activity tolerance  Visit Diagnosis: Acute pain of right shoulder  Cervicalgia  Muscle weakness (generalized)  Other symptoms and signs involving the musculoskeletal system     Problem List Patient Active Problem List   Diagnosis Date Noted  . Acute cystitis without hematuria 06/24/2019  . Tenosynovitis, de Quervain 06/24/2019  . Primary osteoarthritis of first carpometacarpal joint of right hand 03/18/2019  . Chronic right shoulder pain 03/18/2019  . Bilateral foot pain 03/18/2019  . Primary osteoarthritis of both knees 03/18/2019  . Fibromyalgia 03/15/2019  . Lumbar degenerative disc disease 12/19/2018  . Myalgia 11/27/2018  . Morning stiffness of joints 11/27/2018  . DDD (degenerative disc disease), cervical 11/27/2018  . Swelling of left upper eyelid 09/24/2018  . Attention deficit hyperactivity disorder (ADHD), predominantly inattentive type 02/27/2018  . Arthralgia 02/27/2018  . Screening for malignant neoplasm of colon 02/27/2018  . H/O: hysterectomy 02/27/2018  . History of prediabetes 02/27/2018  . History of hypothyroidism 02/27/2018  . NASH (nonalcoholic steatohepatitis) 02/27/2018  . GAD (generalized anxiety disorder) 01/06/2017  . Moderate episode of recurrent major depressive disorder (HFaith 01/06/2017  . Environmental allergies 09/12/2015  . Essential hypertension 09/12/2015  . OSA on CPAP 09/12/2015    Ikenna Ohms , PT 03/26/2020, 6:36 PM  [facility] [address]  [place], [place], [address] Phone: [phone]   Fax:  [phone]  Name: Melanie [name] MRN: [id number] Date of Birth: 02/20/1966

## 2020-03-26 NOTE — Assessment & Plan Note (Signed)
This is a pleasant 54 year old female, she has a historical right-sided C7 distribution radiculopathy, she has had an MRI a year ago. We started her treatment last year, but never really followed through, we will restart with physical therapy, 5 days of prednisone, gabapentin at night. Return to see me in 4 to 6 weeks, epidural injection if no better, she does have C5-C7 DDD, the epidural will be in the form of a right C6-C7 interlaminar epidural.

## 2020-03-26 NOTE — Progress Notes (Signed)
    Procedures performed today:    Procedure: Real-time Ultrasound Guided injection of the right knee Device: Samsung HS60  Verbal informed consent obtained.  Time-out conducted.  Noted no overlying erythema, induration, or other signs of local infection.  Skin prepped in a sterile fashion.  Local anesthesia: Topical Ethyl chloride.  With sterile technique and under real time ultrasound guidance: 1 cc Kenalog 40, 2 cc lidocaine, 2 cc bupivacaine injected easily Completed without difficulty  Pain immediately resolved suggesting accurate placement of the medication.  Advised to call if fevers/chills, erythema, induration, drainage, or persistent bleeding.  Images permanently stored and available for review in PACS.  Impression: Technically successful ultrasound guided injection.  Independent interpretation of notes and tests performed by another provider:   None.  Brief History, Exam, Impression, and Recommendations:    DDD (degenerative disc disease), cervical This is a pleasant 54 year old female, she has a historical right-sided C7 distribution radiculopathy, she has had an MRI a year ago. We started her treatment last year, but never really followed through, we will restart with physical therapy, 5 days of prednisone, gabapentin at night. Return to see me in 4 to 6 weeks, epidural injection if no better, she does have C5-C7 DDD, the epidural will be in the form of a right C6-C7 interlaminar epidural.  Primary osteoarthritis of both knees Good response to right knee injection approximately 4 months ago, now with recurrence of pain, we injected right knee today, return as needed for this.    ___________________________________________ [name]. [name], [name], ABFM., CAQSM. Primary Care and [place] Instructor of [place] [facility] of Medicine

## 2020-03-30 ENCOUNTER — Encounter: Payer: No Typology Code available for payment source | Admitting: Medical-Surgical

## 2020-03-31 ENCOUNTER — Other Ambulatory Visit: Payer: Self-pay

## 2020-03-31 DIAGNOSIS — F900 Attention-deficit hyperactivity disorder, predominantly inattentive type: Secondary | ICD-10-CM

## 2020-03-31 MED ORDER — LISDEXAMFETAMINE DIMESYLATE 50 MG PO CAPS: 50.0000 mg | ORAL_CAPSULE | Freq: Every day | ORAL | 0 refills | Status: DC

## 2020-03-31 MED FILL — VYVANSE 50 MG CAPSULE: 50 | 30 days supply | Qty: 30 | Fill #0

## 2020-03-31 NOTE — Telephone Encounter (Signed)
Routing to covering provider:  Pt called requesting med refill for vyvanse. Per pt, she had to r/s her appointment due to work schedule. Her next appt is 04/10/2020. Rx pended.

## 2020-04-03 ENCOUNTER — Encounter: Payer: No Typology Code available for payment source | Admitting: Rehabilitative and Restorative Service Providers"

## 2020-04-07 ENCOUNTER — Telehealth: Payer: Self-pay

## 2020-04-07 ENCOUNTER — Encounter: Payer: No Typology Code available for payment source | Admitting: Rehabilitative and Restorative Service Providers"

## 2020-04-07 NOTE — Telephone Encounter (Signed)
Pt called and LVM stating that she is mentally and physically exhausted. She has been seeing Joy lately but is a pt of Dr. Sheppard Coil. She has also been seeing [name] for treatment for a lot of pain. She is wanting to have paperwork filled out for [facility] [employer] and needs Joy and [name] to complete the forms for her. She is scheduled for an OV with Joy on 04/10/2020 at 10:10 AM but is going to try and get a virtual appt scheduled with Joy before that day. She just wanted to give a heads up about wanting to have the paperwork completed.

## 2020-04-08 NOTE — Telephone Encounter (Signed)
We got her paperwork on the sports medicine side of things, if we are both going to need to be filling it out she will need to work with me together on it in an office visit.

## 2020-04-08 NOTE — Telephone Encounter (Signed)
Pt aware that Dr. Darene [name] has the paperwork and that if she wants him to complete the forms that she will need to come in for an OV for completion. Pt was also wanting to figure out who needed to fill out the forms for her to get FMLA, Dr. Darene [name] or Melanie [name] or PT. I told her that was a decision she needed to make. She said that the main reason she wanted FMLA is because of her neck pain. I told her that with that being the main reason that Dr. Darene [name] would be the one to complete the form because he was the provider managing that. She asked if he would be able to put information on the form based upon PT office notes. I told her that he would be able to state that he referred her to PT but in regards to progress notes that would need to be completed by PT. Pt agreeable to scheduling an OV with Dr. Darene [name] so the call was transferred to the front desk for scheduling.

## 2020-04-08 NOTE — Telephone Encounter (Signed)
Pt called and said that [employer] is supposed to be sending the paperwork to [place] regarding FMLA for completion. I told her that we had not received the paperwork yet and she requested an e-mail address to forward it to. I gave her my work e-mail address to send the papers to. Pt is scheduled for an OV with Joy on 04/10/2020.

## 2020-04-10 ENCOUNTER — Other Ambulatory Visit: Payer: Self-pay

## 2020-04-10 ENCOUNTER — Ambulatory Visit (INDEPENDENT_AMBULATORY_CARE_PROVIDER_SITE_OTHER): Payer: No Typology Code available for payment source | Admitting: Physical Therapy

## 2020-04-10 ENCOUNTER — Encounter: Payer: Self-pay | Admitting: Medical-Surgical

## 2020-04-10 ENCOUNTER — Encounter: Payer: Self-pay | Admitting: Physical Therapy

## 2020-04-10 ENCOUNTER — Ambulatory Visit (INDEPENDENT_AMBULATORY_CARE_PROVIDER_SITE_OTHER): Payer: No Typology Code available for payment source | Admitting: Medical-Surgical

## 2020-04-10 VITALS — BP 145/89 | HR 89 | Temp 98.1°F | Ht 67.0 in | Wt 224.1 lb

## 2020-04-10 DIAGNOSIS — F900 Attention-deficit hyperactivity disorder, predominantly inattentive type: Secondary | ICD-10-CM

## 2020-04-10 DIAGNOSIS — Z0000 Encounter for general adult medical examination without abnormal findings: Secondary | ICD-10-CM | POA: Diagnosis not present

## 2020-04-10 DIAGNOSIS — Z23 Encounter for immunization: Secondary | ICD-10-CM

## 2020-04-10 DIAGNOSIS — Z1159 Encounter for screening for other viral diseases: Secondary | ICD-10-CM

## 2020-04-10 DIAGNOSIS — K7581 Nonalcoholic steatohepatitis (NASH): Secondary | ICD-10-CM

## 2020-04-10 DIAGNOSIS — M6281 Muscle weakness (generalized): Secondary | ICD-10-CM

## 2020-04-10 DIAGNOSIS — Z87898 Personal history of other specified conditions: Secondary | ICD-10-CM

## 2020-04-10 DIAGNOSIS — I10 Essential (primary) hypertension: Secondary | ICD-10-CM | POA: Diagnosis not present

## 2020-04-10 DIAGNOSIS — Z8639 Personal history of other endocrine, nutritional and metabolic disease: Secondary | ICD-10-CM

## 2020-04-10 DIAGNOSIS — M542 Cervicalgia: Secondary | ICD-10-CM | POA: Diagnosis not present

## 2020-04-10 DIAGNOSIS — M25511 Pain in right shoulder: Secondary | ICD-10-CM | POA: Diagnosis not present

## 2020-04-10 DIAGNOSIS — Z114 Encounter for screening for human immunodeficiency virus [HIV]: Secondary | ICD-10-CM

## 2020-04-10 DIAGNOSIS — R29898 Other symptoms and signs involving the musculoskeletal system: Secondary | ICD-10-CM

## 2020-04-10 DIAGNOSIS — Z1211 Encounter for screening for malignant neoplasm of colon: Secondary | ICD-10-CM

## 2020-04-10 NOTE — Progress Notes (Signed)
HPI: Melanie [name] is a 54 y.o. female who  has no past medical history on file.  she presents to [facility] today, 04/10/20,  for chief complaint of: Annual physical exam  Dentist: every 6 months for cleanings on average, no concerns Eye exam: Last in May 2020, wears glasses Exercise: walking during work mainly Diet: avoids red meat, no other modification Pap smear: TAH, no longer a candidate Mammogram: done December 2020 Colon cancer screening: due, referral in place COVID vaccine: done, booster done  ADHD-taking Vyvanse 50 mg daily, tolerating well without side effects.  Has been having more headaches frequently and wonders if it is related to the dosing but is unsure.  No changes in appetite.  Continued chronic difficulty with sleep onset.  Past medical, surgical, social and family history reviewed:  Patient Active Problem List   Diagnosis Date Noted  . Acute cystitis without hematuria 06/24/2019  . Tenosynovitis, de Quervain 06/24/2019  . Primary osteoarthritis of first carpometacarpal joint of right hand 03/18/2019  . Chronic right shoulder pain 03/18/2019  . Bilateral foot pain 03/18/2019  . Primary osteoarthritis of both knees 03/18/2019  . Fibromyalgia 03/15/2019  . Lumbar degenerative disc disease 12/19/2018  . Myalgia 11/27/2018  . Morning stiffness of joints 11/27/2018  . DDD (degenerative disc disease), cervical 11/27/2018  . Swelling of left upper eyelid 09/24/2018  . Attention deficit hyperactivity disorder (ADHD), predominantly inattentive type 02/27/2018  . Arthralgia 02/27/2018  . Screening for malignant neoplasm of colon 02/27/2018  . H/O: hysterectomy 02/27/2018  . History of prediabetes 02/27/2018  . History of hypothyroidism 02/27/2018  . NASH (nonalcoholic steatohepatitis) 02/27/2018  . GAD (generalized anxiety disorder) 01/06/2017  . Moderate episode of recurrent major depressive disorder ([place]) 01/06/2017    . Environmental allergies 09/12/2015  . Essential hypertension 09/12/2015  . OSA on CPAP 09/12/2015    Past Surgical History:  Procedure Laterality Date  . ABDOMINAL HYSTERECTOMY    . MYOMECTOMY      Social History   Tobacco Use  . Smoking status: Never Smoker  . Smokeless tobacco: Never Used  Substance Use Topics  . Alcohol use: Yes    Comment: Occassionally    Family History  Problem Relation Age of Onset  . Lung cancer Father   . Diabetes Mother   . Skin cancer Mother      Current medication list and allergy/intolerance information reviewed:    Current Outpatient Medications  Medication Sig Dispense Refill  . aspirin EC 81 MG tablet Take 81 mg by mouth daily.    Marland Kitchen buPROPion (WELLBUTRIN XL) 150 MG 24 hr tablet Take 2 tablets (300 mg total) by mouth daily. (Patient taking differently: Take 150 mg by mouth daily. ) 180 tablet 0  . celecoxib (CELEBREX) 200 MG capsule TAKE 1 TO 2 CAPSULES BY MOUTH DAILY AS NEEDED FOR PAIN 180 capsule 0  . fluticasone (FLONASE) 50 MCG/ACT nasal spray One spray in each nostril twice a day, use left hand for right nostril, and right hand for left nostril. 48 g 3  . gabapentin (NEURONTIN) 300 MG capsule 1 tab p.o. nightly for a week then 2 tabs p.o. nightly for a week if needed then 3 tabs p.o. nightly if needed. 90 capsule 3  . hydrochlorothiazide (HYDRODIURIL) 25 MG tablet TAKE 1 TABLET BY MOUTH ONCE DAILY 90 tablet 1  . ipratropium (ATROVENT) 0.03 % nasal spray INSTILL 2 SPRAYS INTO EACH NOSTRIL EVERY 12 HOURS. 30 mL 0  . levocetirizine (  XYZAL) 5 MG tablet Take 1 tablet (5 mg total) by mouth every evening. 90 tablet 1  . lisdexamfetamine (VYVANSE) 50 MG capsule Take 1 capsule (50 mg total) by mouth daily. 30 capsule 0  . MULTIPLE VITAMIN PO Take 1 tablet by mouth daily.    . Olopatadine HCl 0.2 % SOLN INSTILL 1 DROP INTO EACH EYE ONCE DAILY. 2.5 mL 0  . traZODone (DESYREL) 50 MG tablet Take 0.5-1 tablets (25-50 mg total) by mouth at bedtime  as needed for sleep. (Patient not taking: Reported on 04/10/2020) 30 tablet 0   No current facility-administered medications for this visit.    Allergies  Allergen Reactions  . Clarithromycin Other (See Comments) and Rash    Other Reaction: Fever  . Morphine Other (See Comments)    Other Reaction: mental status alerted  . Sulfa Antibiotics Hives      Review of Systems:  Constitutional:  No  fever, no chills, No recent illness, No unintentional weight changes. + significant fatigue.   HEENT: + headache, no vision change, no hearing change, No sore throat, No  sinus pressure  Cardiac: No  chest pain, No  pressure, No palpitations, No  Orthopnea  Respiratory:  No  shortness of breath. No  Cough  Gastrointestinal: No  abdominal pain, No  nausea, No  vomiting,  No  blood in stool, No  diarrhea, + constipation   Musculoskeletal: + new myalgia/arthralgia  Skin: No  Rash, No other wounds/concerning lesions  Genitourinary: No  incontinence, No abnormal genital bleeding, No abnormal genital discharge  Hem/Onc: No  easy bruising/bleeding, No  abnormal lymph node  Endocrine: No cold intolerance,  No heat intolerance. No polyuria/polydipsia/polyphagia   Neurologic: No  weakness, No  dizziness, No  slurred speech/focal weakness/facial droop  Psychiatric: +  concerns with depression, +  concerns with anxiety, + sleep onset problems, + mood problems  Exam:  BP (!) 145/89   Pulse 89   Temp 98.1 F (36.7 C) (Oral)   Ht 5' 7"  (1.702 m)   Wt 224 lb 1.6 oz (101.7 kg)   SpO2 98%   BMI 35.10 kg/m   Constitutional: VS see above. General Appearance: alert, well-developed, well-nourished, NAD  Eyes: Normal lids and conjunctive, non-icteric sclera  Ears, Nose, Mouth, Throat: MMM, Normal external inspection ears/nares/mouth/lips/gums. TM normal bilaterally. Pharynx/tonsils no erythema, no exudate. Nasal mucosa normal.   Neck: No masses, trachea midline. No thyroid enlargement. No  tenderness/mass appreciated. No lymphadenopathy  Respiratory: Normal respiratory effort. no wheeze, no rhonchi, no rales  Cardiovascular: S1/S2 normal, no murmur, no rub/gallop auscultated. RRR. No lower extremity edema. Pedal pulse II/IV bilaterally DP and PT. No carotid bruit or JVD. No abdominal aortic bruit.  Gastrointestinal: Nontender, no masses. No hepatomegaly, no splenomegaly. No hernia appreciated. Bowel sounds normal. Rectal exam deferred.   Musculoskeletal: Gait normal. No clubbing/cyanosis of digits.   Neurological: Normal balance/coordination. No tremor. No cranial nerve deficit on limited exam. Motor and sensation intact and symmetric. Cerebellar reflexes intact.   Skin: warm, dry, intact. No rash/ulcer. No concerning nevi or subq nodules on limited exam.    Psychiatric: Normal judgment/insight. Normal mood and affect. Oriented x3.    No results found for this or any previous visit (from the past 72 hour(s)).  No results found.   ASSESSMENT/PLAN:   1. Annual physical exam Checking CBC, CMP, and lipid panel today.  Not a candidate for Pap smears.  Mammogram up-to-date. - CBC - COMPLETE METABOLIC PANEL WITH GFR - Lipid  panel  2. Attention deficit hyperactivity disorder (ADHD), predominantly inattentive type Continue Vyvanse 50 mg daily.    3. Essential hypertension Checking labs.  Continue hydrochlorothiazide 25 mg daily.  Monitor blood pressure closely at home as reading is elevated today.  If readings are consistently greater than 130/80 we will need to reevaluate hypertension regimen. - CBC - COMPLETE METABOLIC PANEL WITH GFR - Lipid panel  4. NASH (nonalcoholic steatohepatitis) Checking lipid panel today. - Lipid panel  5. History of prediabetes Checking hemoglobin A1c today. - Hemoglobin A1c  6. History of hypothyroidism Checking TSH today. - TSH  7. Colon cancer screening Referral in place for colonoscopy.  She needs to call and get this  scheduled.  8. Encounter for screening for HIV Discussed screening recommendations.  Patient would like to defer as she has had this done in the past and has had no exposures.  9. Need for hepatitis C screening test Discussed many recommendations.  Patient would like to defer this as well.  10. Need for influenza vaccination Flu vaccine given in office today. - Flu Vaccine QUAD 36+ mos IM   Orders Placed This Encounter  Procedures  . Flu Vaccine QUAD 36+ mos IM  . CBC  . COMPLETE METABOLIC PANEL WITH GFR  . Lipid panel  . TSH  . Hemoglobin A1c    No orders of the defined types were placed in this encounter.   Patient Instructions  Preventive Care 59-64 Years Old, Female Preventive care refers to visits with your health care provider and lifestyle choices that can promote health and wellness. This includes:  A yearly physical exam. This may also be called an annual well check.  Regular dental visits and eye exams.  Immunizations.  Screening for certain conditions.  Healthy lifestyle choices, such as eating a healthy diet, getting regular exercise, not using drugs or products that contain nicotine and tobacco, and limiting alcohol use. What can I expect for my preventive care visit? Physical exam Your health care provider will check your:  Height and weight. This may be used to calculate body mass index (BMI), which tells if you are at a healthy weight.  Heart rate and blood pressure.  Skin for abnormal spots. Counseling Your health care provider may ask you questions about your:  Alcohol, tobacco, and drug use.  Emotional well-being.  Home and relationship well-being.  Sexual activity.  Eating habits.  Work and work Statistician.  Method of birth control.  Menstrual cycle.  Pregnancy history. What immunizations do I need?  Influenza (flu) vaccine  This is recommended every year. Tetanus, diphtheria, and pertussis (Tdap) vaccine  You may need a  Td booster every 10 years. Varicella (chickenpox) vaccine  You may need this if you have not been vaccinated. Zoster (shingles) vaccine  You may need this after age 69. Measles, mumps, and rubella (MMR) vaccine  You may need at least one dose of MMR if you were born in 1957 or later. You may also need a second dose. Pneumococcal conjugate (PCV13) vaccine  You may need this if you have certain conditions and were not previously vaccinated. Pneumococcal polysaccharide (PPSV23) vaccine  You may need one or two doses if you smoke cigarettes or if you have certain conditions. Meningococcal conjugate (MenACWY) vaccine  You may need this if you have certain conditions. Hepatitis A vaccine  You may need this if you have certain conditions or if you travel or work in places where you may be exposed to hepatitis A.  Hepatitis B vaccine  You may need this if you have certain conditions or if you travel or work in places where you may be exposed to hepatitis B. Haemophilus influenzae type b (Hib) vaccine  You may need this if you have certain conditions. Human papillomavirus (HPV) vaccine  If recommended by your health care provider, you may need three doses over 6 months. You may receive vaccines as individual doses or as more than one vaccine together in one shot (combination vaccines). Talk with your health care provider about the risks and benefits of combination vaccines. What tests do I need? Blood tests  Lipid and cholesterol levels. These may be checked every 5 years, or more frequently if you are over 64 years old.  Hepatitis C test.  Hepatitis B test. Screening  Lung cancer screening. You may have this screening every year starting at age 83 if you have a 30-pack-year history of smoking and currently smoke or have quit within the past 15 years.  Colorectal cancer screening. All adults should have this screening starting at age 14 and continuing until age 82. Your health care  provider may recommend screening at age 59 if you are at increased risk. You will have tests every 1-10 years, depending on your results and the type of screening test.  Diabetes screening. This is done by checking your blood sugar (glucose) after you have not eaten for a while (fasting). You may have this done every 1-3 years.  Mammogram. This may be done every 1-2 years. Talk with your health care provider about when you should start having regular mammograms. This may depend on whether you have a family history of breast cancer.  BRCA-related cancer screening. This may be done if you have a family history of breast, ovarian, tubal, or peritoneal cancers.  Pelvic exam and Pap test. This may be done every 3 years starting at age 53. Starting at age 23, this may be done every 5 years if you have a Pap test in combination with an HPV test. Other tests  Sexually transmitted disease (STD) testing.  Bone density scan. This is done to screen for osteoporosis. You may have this scan if you are at high risk for osteoporosis. Follow these instructions at home: Eating and drinking  Eat a diet that includes fresh fruits and vegetables, whole grains, lean protein, and low-fat dairy.  Take vitamin and mineral supplements as recommended by your health care provider.  Do not drink alcohol if: ? Your health care provider tells you not to drink. ? You are pregnant, may be pregnant, or are planning to become pregnant.  If you drink alcohol: ? Limit how much you have to 0-1 drink a day. ? Be aware of how much alcohol is in your drink. In the [place] one drink equals one 12 oz bottle of beer (355 mL), one 5 oz glass of wine (148 mL), or one 1 oz glass of hard liquor (44 mL). Lifestyle  Take daily care of your teeth and gums.  Stay active. Exercise for at least 30 minutes on 5 or more days each week.  Do not use any products that contain nicotine or tobacco, such as cigarettes, e-cigarettes, and chewing  tobacco. If you need help quitting, ask your health care provider.  If you are sexually active, practice safe sex. Use a condom or other form of birth control (contraception) in order to prevent pregnancy and STIs (sexually transmitted infections).  If told by your health care provider, take  low-dose aspirin daily starting at age 9. What's next?  Visit your health care provider once a year for a well check visit.  Ask your health care provider how often you should have your eyes and teeth checked.  Stay up to date on all vaccines. This information is not intended to replace advice given to you by your health care provider. Make sure you discuss any questions you have with your health care provider. Document Revised: 03/01/2018 Document Reviewed: 03/01/2018 Elsevier Patient Education  [place]. Influenza (Flu) Vaccine (Inactivated or Recombinant): What You Need to Know 1. Why get vaccinated? Influenza vaccine can prevent influenza (flu). Flu is a contagious disease that spreads around the [place] every year, usually between October and May. Anyone can get the flu, but it is more dangerous for some people. Infants and young children, people 33 years of age and older, pregnant women, and people with certain health conditions or a weakened immune system are at greatest risk of flu complications. Pneumonia, bronchitis, sinus infections and ear infections are examples of flu-related complications. If you have a medical condition, such as heart disease, cancer or diabetes, flu can make it worse. Flu can cause fever and chills, sore throat, muscle aches, fatigue, cough, headache, and runny or stuffy nose. Some people may have vomiting and diarrhea, though this is more common in children than adults. Each year thousands of people in the [place] die from flu, and many more are hospitalized. Flu vaccine prevents millions of illnesses and flu-related visits to the doctor each  year. 2. Influenza vaccine CDC recommends everyone 68 months of age and older get vaccinated every flu season. Children 6 months through 26 years of age may need 2 doses during a single flu season. Everyone else needs only 1 dose each flu season. It takes about 2 weeks for protection to develop after vaccination. There are many flu viruses, and they are always changing. Each year a new flu vaccine is made to protect against three or four viruses that are likely to cause disease in the upcoming flu season. Even when the vaccine doesn't exactly match these viruses, it may still provide some protection. Influenza vaccine does not cause flu. Influenza vaccine may be given at the same time as other vaccines. 3. Talk with your health care provider Tell your vaccine provider if the person getting the vaccine:  Has had an allergic reaction after a previous dose of influenza vaccine, or has any severe, life-threatening allergies.  Has ever had Guillain-Barr Syndrome (also called GBS). In some cases, your health care provider may decide to postpone influenza vaccination to a future visit. People with minor illnesses, such as a cold, may be vaccinated. People who are moderately or severely ill should usually wait until they recover before getting influenza vaccine. Your health care provider can give you more information. 4. Risks of a vaccine reaction  Soreness, redness, and swelling where shot is given, fever, muscle aches, and headache can happen after influenza vaccine.  There may be a very small increased risk of Guillain-Barr Syndrome (GBS) after inactivated influenza vaccine (the flu shot). Young children who get the flu shot along with pneumococcal vaccine (PCV13), and/or DTaP vaccine at the same time might be slightly more likely to have a seizure caused by fever. Tell your health care provider if a child who is getting flu vaccine has ever had a seizure. People sometimes faint after medical  procedures, including vaccination. Tell your provider if you feel dizzy  or have vision changes or ringing in the ears. As with any medicine, there is a very remote chance of a vaccine causing a severe allergic reaction, other serious injury, or death. 5. What if there is a serious problem? An allergic reaction could occur after the vaccinated person leaves the clinic. If you see signs of a severe allergic reaction (hives, swelling of the face and throat, difficulty breathing, a fast heartbeat, dizziness, or weakness), call 9-1-1 and get the person to the nearest hospital. For other signs that concern you, call your health care provider. Adverse reactions should be reported to the Vaccine Adverse Event Reporting System (VAERS). Your health care provider will usually file this report, or you can do it yourself. Visit the VAERS website at www.vaers.SamedayNews.es or call (534) 157-1857.VAERS is only for reporting reactions, and VAERS staff do not give medical advice. 6. The National Vaccine Injury Compensation Program The [employer] Vaccine Injury Compensation Program (VICP) is a federal program that was created to compensate people who may have been injured by certain vaccines. Visit the VICP website at GoldCloset.com.ee or call 339-639-4510 to learn about the program and about filing a claim. There is a time limit to file a claim for compensation. 7. How can I learn more?  Ask your healthcare provider.  Call your local or state health department.  Contact the Centers for Disease Control and Prevention (CDC): ? Call 347-591-6893 (1-800-CDC-INFO) or ? Visit CDC's https://gibson.com/ Vaccine Information Statement (Interim) Inactivated Influenza Vaccine (02/15/2018) This information is not intended to replace advice given to you by your health care provider. Make sure you discuss any questions you have with your health care provider. Document Revised: 10/09/2018 Document Reviewed:  02/19/2018 Elsevier Patient Education  [place].   Follow-up plan: Return in about 3 months (around 07/11/2020) for ADHD/HTN follow up.  Clearnce Sorrel, DNP, APRN, FNP-BC [facility]

## 2020-04-10 NOTE — Therapy (Signed)
[facility] [place], [place], [address] Phone: [phone]   Fax:  [phone]  Physical Therapy Treatment  Patient Details  Name: Melanie [name] MRN: [id number] Date of Birth: [birth year] Referring Provider (PT): Aundria Mems, MD   Encounter Date: 04/10/2020   PT End of Session - 04/10/20 1119    Visit Number 2    Number of Visits 12    Date for PT Re-Evaluation 05/07/20    Authorization Type UMR    PT Start Time 1118    PT Stop Time 1203    PT Time Calculation (min) 45 min    Behavior During Therapy [facility] for tasks assessed/performed           History reviewed. No pertinent past medical history.  Past Surgical History:  Procedure Laterality Date  . ABDOMINAL HYSTERECTOMY    . MYOMECTOMY      There were no vitals filed for this visit.   Subjective Assessment - 04/10/20 1120    Subjective Pt reports her neck felt better initially after DN, but now it is worse "It ceased up". Pt reports that since last visit, she has completed a round of Prednisone.  She believes this may have helped the numbness in her Rt arm into hand.     Pain Score 7     Pain Location Neck    Pain Orientation Right;Left    Pain Descriptors / Indicators Aching    Aggravating Factors  reaching, moving head, driving    Pain Relieving Factors medicine, heat              [facility] PT Assessment - 04/10/20 0001      Assessment   Medical Diagnosis cervical DDD    Referring Provider (PT) Aundria Mems, MD    Onset Date/Surgical Date 03/26/20    Hand Dominance Right      AROM   Right Shoulder Flexion 153 Degrees    Right Shoulder ABduction 132 Degrees   with pain   Cervical - Right Side Bend 27    Cervical - Left Side Bend 19      Strength   Right Hand Grip (lbs) 60    Left Hand Grip (lbs) 76           [facility] Adult PT Treatment/Exercise - 04/10/20 0001      Neck Exercises: Seated   Lateral Flexion  Right;Left;5 reps      Neck Exercises: Supine   Cervical Rotation Right;Left;5 reps with head nods    Other Supine Exercise scap squeeze x 5 sec x 5 reps     Other Supine Exercise decompression position during education of importance of posture, discussing how work and sleep position affect tightness in neck/shoulder. Trialed different positions with RUE to assess affects on radicular symptoms into hand.      Shoulder Exercises: Supine   Other Supine Exercises bilat flexion to overhead (RUE range to tolerance) x 3 reps.        Modalities   Modalities Electrical Stimulation;Moist Heat      Moist Heat Therapy   Number Minutes Moist Heat 10 Minutes    Moist Heat Location Cervical      Electrical Stimulation   Electrical Stimulation Location Rt upper thoracic erector spinae, upper trap, levator     Electrical Stimulation Action TENS     Electrical Stimulation Parameters intensity to tolerance x 10 mi (with MHP)    Electrical Stimulation Goals Pain;Tone  Manual Therapy   Manual therapy comments I strip of sensitive skin Rock tape applled to Rt ant shoulder (bicep brachii origin to mid bicep with 10% stretch to decompress tissue and increase proprioception.     Soft tissue mobilization IASTM to Rt ant/lateral prox shoulder and bicep brachii to decrease fascial restrictions and improve mobility.               PT Long Term Goals - 03/26/20 1825      PT LONG TERM GOAL #1   Title The patient will be indep with HEP.    Time 6    Period Weeks    Target Date 05/07/20      PT LONG TERM GOAL #2   Title The patient will reduce functional limitation per FOTO from 65% to < or equal to 47%.    Time 6    Period Weeks    Target Date 05/07/20      PT LONG TERM GOAL #3   Title The patient will reduce pain from m7/10 to < or equal to 5/10.    Time 6    Period Weeks    Target Date 05/07/20      PT LONG TERM GOAL #4   Title The patient will improve grip strength R hand to equal L.     Time 6    Period Weeks    Target Date 05/07/20                 Plan - 04/10/20 1610    Clinical Impression Statement Improvement demonstrated in Rt shoulder ROM and Rt hand grip strength.  Cervical ROM more limited today than eval.  Pt reported mild reduction of pain with TENs/MHP; issued information for home use.  Limited tolerance for exercise, however improved from eval.  Goals are ongoing.    Personal Factors and Comorbidities Comorbidity 2    Comorbidities HTN, hypothyroidism    Examination-Activity Limitations Reach Overhead;Lift;Carry    Examination-Participation Restrictions Occupation;Cleaning;Community Activity    Stability/Clinical Decision Making Stable/Uncomplicated    Rehab Potential Good    PT Frequency 2x / week    PT Duration 6 weeks    PT Treatment/Interventions ADLs/Self Care Home Management;Patient/family education;Therapeutic activities;Therapeutic exercise;Electrical Stimulation;Traction;Moist Heat;Cryotherapy;Taping;Dry needling;Manual techniques;Iontophoresis 57m/ml Dexamethasone    PT Next Visit Plan Progress HEP for gentle AROM of cspine and shoulder; postural strengthening to tolerance.  Traction trial.    Consulted and Agree with Plan of Care Patient           Patient will benefit from skilled therapeutic intervention in order to improve the following deficits and impairments:  Pain, Decreased range of motion, Decreased strength, Impaired flexibility, Hypomobility, Impaired sensation, Postural dysfunction, Decreased activity tolerance  Visit Diagnosis: Cervicalgia  Acute pain of right shoulder  Muscle weakness (generalized)  Other symptoms and signs involving the musculoskeletal system     Problem List Patient Active Problem List   Diagnosis Date Noted  . Acute cystitis without hematuria 06/24/2019  . Tenosynovitis, de Quervain 06/24/2019  . Primary osteoarthritis of first carpometacarpal joint of right hand 03/18/2019  . Chronic right  shoulder pain 03/18/2019  . Bilateral foot pain 03/18/2019  . Primary osteoarthritis of both knees 03/18/2019  . Fibromyalgia 03/15/2019  . Lumbar degenerative disc disease 12/19/2018  . Myalgia 11/27/2018  . Morning stiffness of joints 11/27/2018  . DDD (degenerative disc disease), cervical 11/27/2018  . Swelling of left upper eyelid 09/24/2018  . Attention deficit hyperactivity disorder (ADHD), predominantly inattentive type  02/27/2018  . Arthralgia 02/27/2018  . Screening for malignant neoplasm of colon 02/27/2018  . H/O: hysterectomy 02/27/2018  . History of prediabetes 02/27/2018  . History of hypothyroidism 02/27/2018  . NASH (nonalcoholic steatohepatitis) 02/27/2018  . GAD (generalized anxiety disorder) 01/06/2017  . Moderate episode of recurrent major depressive disorder ([place]) 01/06/2017  . Environmental allergies 09/12/2015  . Essential hypertension 09/12/2015  . OSA on CPAP 09/12/2015   Kerin Perna, PTA 04/10/20 4:13 PM  [facility] [address] [place], [place], [address] Phone: [phone]   Fax:  [phone]  Name: Melanie [name] MRN: [id number] Date of Birth: 03/12/1966

## 2020-04-10 NOTE — Patient Instructions (Addendum)
Preventive Care 40-53 Years Old, Female Preventive care refers to visits with your health care provider and lifestyle choices that can promote health and wellness. This includes:  A yearly physical exam. This may also be called an annual well check.  Regular dental visits and eye exams.  Immunizations.  Screening for certain conditions.  Healthy lifestyle choices, such as eating a healthy diet, getting regular exercise, not using drugs or products that contain nicotine and tobacco, and limiting alcohol use. What can I expect for my preventive care visit? Physical exam Your health care provider will check your:  Height and weight. This may be used to calculate body mass index (BMI), which tells if you are at a healthy weight.  Heart rate and blood pressure.  Skin for abnormal spots. Counseling Your health care provider may ask you questions about your:  Alcohol, tobacco, and drug use.  Emotional well-being.  Home and relationship well-being.  Sexual activity.  Eating habits.  Work and work environment.  Method of birth control.  Menstrual cycle.  Pregnancy history. What immunizations do I need?  Influenza (flu) vaccine  This is recommended every year. Tetanus, diphtheria, and pertussis (Tdap) vaccine  You may need a Td booster every 10 years. Varicella (chickenpox) vaccine  You may need this if you have not been vaccinated. Zoster (shingles) vaccine  You may need this after age 60. Measles, mumps, and rubella (MMR) vaccine  You may need at least one dose of MMR if you were born in 1957 or later. You may also need a second dose. Pneumococcal conjugate (PCV13) vaccine  You may need this if you have certain conditions and were not previously vaccinated. Pneumococcal polysaccharide (PPSV23) vaccine  You may need one or two doses if you smoke cigarettes or if you have certain conditions. Meningococcal conjugate (MenACWY) vaccine  You may need this if you  have certain conditions. Hepatitis A vaccine  You may need this if you have certain conditions or if you travel or work in places where you may be exposed to hepatitis A. Hepatitis B vaccine  You may need this if you have certain conditions or if you travel or work in places where you may be exposed to hepatitis B. Haemophilus influenzae type b (Hib) vaccine  You may need this if you have certain conditions. Human papillomavirus (HPV) vaccine  If recommended by your health care provider, you may need three doses over 6 months. You may receive vaccines as individual doses or as more than one vaccine together in one shot (combination vaccines). Talk with your health care provider about the risks and benefits of combination vaccines. What tests do I need? Blood tests  Lipid and cholesterol levels. These may be checked every 5 years, or more frequently if you are over 50 years old.  Hepatitis C test.  Hepatitis B test. Screening  Lung cancer screening. You may have this screening every year starting at age 55 if you have a 30-pack-year history of smoking and currently smoke or have quit within the past 15 years.  Colorectal cancer screening. All adults should have this screening starting at age 50 and continuing until age 75. Your health care provider may recommend screening at age 45 if you are at increased risk. You will have tests every 1-10 years, depending on your results and the type of screening test.  Diabetes screening. This is done by checking your blood sugar (glucose) after you have not eaten for a while (fasting). You may have this   done every 1-3 years.  Mammogram. This may be done every 1-2 years. Talk with your health care provider about when you should start having regular mammograms. This may depend on whether you have a family history of breast cancer.  BRCA-related cancer screening. This may be done if you have a family history of breast, ovarian, tubal, or peritoneal  cancers.  Pelvic exam and Pap test. This may be done every 3 years starting at age 47. Starting at age 96, this may be done every 5 years if you have a Pap test in combination with an HPV test. Other tests  Sexually transmitted disease (STD) testing.  Bone density scan. This is done to screen for osteoporosis. You may have this scan if you are at high risk for osteoporosis. Follow these instructions at home: Eating and drinking  Eat a diet that includes fresh fruits and vegetables, whole grains, lean protein, and low-fat dairy.  Take vitamin and mineral supplements as recommended by your health care provider.  Do not drink alcohol if: ? Your health care provider tells you not to drink. ? You are pregnant, may be pregnant, or are planning to become pregnant.  If you drink alcohol: ? Limit how much you have to 0-1 drink a day. ? Be aware of how much alcohol is in your drink. In the [place] one drink equals one 12 oz bottle of beer (355 mL), one 5 oz glass of wine (148 mL), or one 1 oz glass of hard liquor (44 mL). Lifestyle  Take daily care of your teeth and gums.  Stay active. Exercise for at least 30 minutes on 5 or more days each week.  Do not use any products that contain nicotine or tobacco, such as cigarettes, e-cigarettes, and chewing tobacco. If you need help quitting, ask your health care provider.  If you are sexually active, practice safe sex. Use a condom or other form of birth control (contraception) in order to prevent pregnancy and STIs (sexually transmitted infections).  If told by your health care provider, take low-dose aspirin daily starting at age 40. What's next?  Visit your health care provider once a year for a well check visit.  Ask your health care provider how often you should have your eyes and teeth checked.  Stay up to date on all vaccines. This information is not intended to replace advice given to you by your health care provider. Make sure you  discuss any questions you have with your health care provider. Document Revised: 03/01/2018 Document Reviewed: 03/01/2018 Elsevier Patient Education  [place]. Influenza (Flu) Vaccine (Inactivated or Recombinant): What You Need to Know 1. Why get vaccinated? Influenza vaccine can prevent influenza (flu). Flu is a contagious disease that spreads around the [place] every year, usually between October and May. Anyone can get the flu, but it is more dangerous for some people. Infants and young children, people 68 years of age and older, pregnant women, and people with certain health conditions or a weakened immune system are at greatest risk of flu complications. Pneumonia, bronchitis, sinus infections and ear infections are examples of flu-related complications. If you have a medical condition, such as heart disease, cancer or diabetes, flu can make it worse. Flu can cause fever and chills, sore throat, muscle aches, fatigue, cough, headache, and runny or stuffy nose. Some people may have vomiting and diarrhea, though this is more common in children than adults. Each year thousands of people in the [place] die from  from flu, and many more are hospitalized. Flu vaccine prevents millions of illnesses and flu-related visits to the doctor each year. 2. Influenza vaccine CDC recommends everyone 6 months of age and older get vaccinated every flu season. Children 6 months through 8 years of age may need 2 doses during a single flu season. Everyone else needs only 1 dose each flu season. It takes about 2 weeks for protection to develop after vaccination. There are many flu viruses, and they are always changing. Each year a new flu vaccine is made to protect against three or four viruses that are likely to cause disease in the upcoming flu season. Even when the vaccine doesn't exactly match these viruses, it may still provide some protection. Influenza vaccine does not cause flu. Influenza  vaccine may be given at the same time as other vaccines. 3. Talk with your health care provider Tell your vaccine provider if the person getting the vaccine:  Has had an allergic reaction after a previous dose of influenza vaccine, or has any severe, life-threatening allergies.  Has ever had Guillain-Barr Syndrome (also called GBS). In some cases, your health care provider may decide to postpone influenza vaccination to a future visit. People with minor illnesses, such as a cold, may be vaccinated. People who are moderately or severely ill should usually wait until they recover before getting influenza vaccine. Your health care provider can give you more information. 4. Risks of a vaccine reaction  Soreness, redness, and swelling where shot is given, fever, muscle aches, and headache can happen after influenza vaccine.  There may be a very small increased risk of Guillain-Barr Syndrome (GBS) after inactivated influenza vaccine (the flu shot). Young children who get the flu shot along with pneumococcal vaccine (PCV13), and/or DTaP vaccine at the same time might be slightly more likely to have a seizure caused by fever. Tell your health care provider if a child who is getting flu vaccine has ever had a seizure. People sometimes faint after medical procedures, including vaccination. Tell your provider if you feel dizzy or have vision changes or ringing in the ears. As with any medicine, there is a very remote chance of a vaccine causing a severe allergic reaction, other serious injury, or death. 5. What if there is a serious problem? An allergic reaction could occur after the vaccinated person leaves the clinic. If you see signs of a severe allergic reaction (hives, swelling of the face and throat, difficulty breathing, a fast heartbeat, dizziness, or weakness), call 9-1-1 and get the person to the nearest hospital. For other signs that concern you, call your health care provider. Adverse  reactions should be reported to the Vaccine Adverse Event Reporting System (VAERS). Your health care provider will usually file this report, or you can do it yourself. Visit the VAERS website at www.vaers.hhs.gov or call 1-800-822-7967.VAERS is only for reporting reactions, and VAERS staff do not give medical advice. 6. The National Vaccine Injury Compensation Program The National Vaccine Injury Compensation Program (VICP) is a federal program that was created to compensate people who may have been injured by certain vaccines. Visit the VICP website at www.hrsa.gov/vaccinecompensation or call 1-800-338-2382 to learn about the program and about filing a claim. There is a time limit to file a claim for compensation. 7. How can I learn more?  Ask your healthcare provider.  Call your local or state health department.  Contact the Centers for Disease Control and Prevention (CDC): ? Call 1-800-232-4636 (1-800-CDC-INFO) or ? Visit CDC's   www.cdc.gov/flu Vaccine Information Statement (Interim) Inactivated Influenza Vaccine (02/15/2018) This information is not intended to replace advice given to you by your health care provider. Make sure you discuss any questions you have with your health care provider. Document Revised: 10/09/2018 Document Reviewed: 02/19/2018 Elsevier Patient Education  2020 Elsevier Inc.  

## 2020-04-10 NOTE — Patient Instructions (Signed)
Kinesiology tape What is kinesiology tape?  There are many brands of kinesiology tape.  KTape, [employer], [employer], Dynamic tape, to name a few. It is an elasticized tape designed to support the body's natural healing process. This tape provides stability and support to muscles and joints without restricting motion. It can also help decrease swelling in the area of application. How does it work? The tape microscopically lifts and decompresses the skin to allow for drainage of lymph (swelling) to flow away from area, reducing inflammation.  The tape has the ability to help re-educate the neuromuscular system by targeting specific receptors in the skin.  The presence of the tape increases the body's awareness of posture and body mechanics.  Do not use with: . Open wounds . Skin lesions . Adhesive allergies Safe removal of the tape: In some rare cases, mild/moderate skin irritation can occur.  This can include redness, itchiness, or hives. If this occurs, immediately remove tape and consult your primary care physician if symptoms are severe or do not resolve within 2 days.  To remove tape safely, hold nearby skin with one hand and gentle roll tape down with other hand.  You can apply oil or conditioner to tape while in shower prior to removal to loosen adhesive.  DO NOT swiftly rip tape off like a band-aid, as this could cause skin tears and additional skin irritation.   TENS UNIT: This is helpful for muscle pain and spasm.   Search and Purchase a TENS 7000 2nd edition at [employer] It should be less than $30.     TENS unit instructions: Do not shower or bathe with the unit on Turn the unit off before removing electrodes or batteries If the electrodes lose stickiness add a drop of water to the electrodes after they are disconnected from the unit and place on plastic sheet. If you continued to have difficulty, call the TENS unit company to purchase more electrodes. Do not apply lotion on  the skin area prior to use. Make sure the skin is clean and dry as this will help prolong the life of the electrodes. After use, always check skin for unusual red areas, rash or other skin difficulties. If there are any skin problems, does not apply electrodes to the same area. Never remove the electrodes from the unit by pulling the wires. Do not use the TENS unit or electrodes other than as directed. Do not change electrode placement without consultating your therapist or physician. Keep 2 fingers with between each electrode. Wear time ratio is 2:1, on to off times.    For example on for 30 minutes off for 15 minutes and then on for 30 minutes off for 15 minutes

## 2020-04-13 ENCOUNTER — Encounter: Payer: Self-pay | Admitting: Medical-Surgical

## 2020-04-14 ENCOUNTER — Encounter: Payer: No Typology Code available for payment source | Admitting: Osteopathic Medicine

## 2020-04-14 ENCOUNTER — Ambulatory Visit (INDEPENDENT_AMBULATORY_CARE_PROVIDER_SITE_OTHER): Payer: No Typology Code available for payment source | Admitting: Sports Medicine

## 2020-04-14 ENCOUNTER — Other Ambulatory Visit: Payer: Self-pay | Admitting: Sports Medicine

## 2020-04-14 DIAGNOSIS — M170 Bilateral primary osteoarthritis of knee: Secondary | ICD-10-CM | POA: Diagnosis not present

## 2020-04-14 DIAGNOSIS — G8929 Other chronic pain: Secondary | ICD-10-CM

## 2020-04-14 DIAGNOSIS — M25511 Pain in right shoulder: Secondary | ICD-10-CM

## 2020-04-14 DIAGNOSIS — M5030 Other cervical disc degeneration, unspecified cervical region: Secondary | ICD-10-CM | POA: Diagnosis not present

## 2020-04-14 MED ORDER — PREGABALIN 25 MG PO CAPS: ORAL_CAPSULE | ORAL | 3 refills | Status: DC

## 2020-04-14 MED FILL — PREGABALIN 25 MG CAPS: 25 | 30 days supply | Qty: 90 | Fill #0

## 2020-04-14 NOTE — Addendum Note (Signed)
Addended by: Silverio Decamp on: 04/14/2020 10:31 AM   Modules accepted: Orders

## 2020-04-14 NOTE — Assessment & Plan Note (Signed)
Melanie [name] returns, she continues to have right-sided C7 distribution radiculitis, neck and periscapular pain, she is doing physical therapy, we already did 5 days of prednisone and she is having trouble tolerating gabapentin, switching to Lyrica 25 with an up taper. At this point she would like to try some other modalities including physical therapy, chiropractic manipulation. Massage therapy. I think this is all reasonable before considering a cervical epidural. We did fill out extensive paperwork today.

## 2020-04-14 NOTE — Assessment & Plan Note (Signed)
Persistent pain in right knee after injection, proceeding with MRI for arthroscopy planning.

## 2020-04-14 NOTE — Progress Notes (Addendum)
    Procedures performed today:    None.  Independent interpretation of notes and tests performed by another provider:   None.  Brief History, Exam, Impression, and Recommendations:    DDD (degenerative disc disease), cervical Aleea returns, she continues to have right-sided C7 distribution radiculitis, neck and periscapular pain, she is doing physical therapy, we already did 5 days of prednisone and she is having trouble tolerating gabapentin, switching to Lyrica 25 with an up taper. At this point she would like to try some other modalities including physical therapy, chiropractic manipulation. Massage therapy. I think this is all reasonable before considering a cervical epidural. We did fill out extensive paperwork today.  Primary osteoarthritis of both knees Persistent pain in right knee after injection, proceeding with MRI for arthroscopy planning.  Chronic right shoulder pain Toniann is also had chronic shoulder pain, localized at the acromioclavicular joint with rotator cuff signs, proceeding with x-ray and MRI.  I spent 40 minutes of total time managing this patient today, this includes chart review, face to face, and non-face to face time.   ___________________________________________ [name]. [name], [name], ABFM., CAQSM. Primary Care and [place] Instructor of [employer] [facility] [employer]

## 2020-04-14 NOTE — Assessment & Plan Note (Signed)
Lamika is also had chronic shoulder pain, localized at the acromioclavicular joint with rotator cuff signs, proceeding with x-ray and MRI.

## 2020-04-15 ENCOUNTER — Ambulatory Visit (INDEPENDENT_AMBULATORY_CARE_PROVIDER_SITE_OTHER): Payer: No Typology Code available for payment source | Admitting: Physical Therapy

## 2020-04-15 ENCOUNTER — Other Ambulatory Visit: Payer: Self-pay

## 2020-04-15 DIAGNOSIS — M25511 Pain in right shoulder: Secondary | ICD-10-CM

## 2020-04-15 DIAGNOSIS — R29898 Other symptoms and signs involving the musculoskeletal system: Secondary | ICD-10-CM | POA: Diagnosis not present

## 2020-04-15 DIAGNOSIS — M542 Cervicalgia: Secondary | ICD-10-CM | POA: Diagnosis not present

## 2020-04-15 DIAGNOSIS — M6281 Muscle weakness (generalized): Secondary | ICD-10-CM

## 2020-04-15 LAB — CBC
HCT: 40.3 % (ref 35.0–45.0)
Hemoglobin: 14.1 g/dL (ref 11.7–15.5)
MCH: 31.1 pg (ref 27.0–33.0)
MCHC: 35 g/dL (ref 32.0–36.0)
MCV: 88.8 fL (ref 80.0–100.0)
MPV: 11.6 fL (ref 7.5–12.5)
Platelets: 308 10*3/uL (ref 140–400)
RBC: 4.54 10*6/uL (ref 3.80–5.10)
RDW: 11.5 % (ref 11.0–15.0)
WBC: 5.4 10*3/uL (ref 3.8–10.8)

## 2020-04-15 LAB — LIPID PANEL
Cholesterol: 253 mg/dL — ABNORMAL HIGH (ref <200)
HDL: 45 mg/dL — ABNORMAL LOW (ref >=50)
LDL Cholesterol (Calc): 156 mg/dL (calc) — ABNORMAL HIGH
Non-HDL Cholesterol (Calc): 208 mg/dL (calc) — ABNORMAL HIGH (ref <130)
Total CHOL/HDL Ratio: 5.6 (calc) — ABNORMAL HIGH (ref <5.0)
Triglycerides: 338 mg/dL — ABNORMAL HIGH (ref <150)

## 2020-04-15 LAB — HEMOGLOBIN A1C
Hgb A1c MFr Bld: 7.1 % of total Hgb — ABNORMAL HIGH (ref <5.7)
Mean Plasma Glucose: 157 (calc)
eAG (mmol/L): 8.7 (calc)

## 2020-04-15 LAB — COMPLETE METABOLIC PANEL WITH GFR
AG Ratio: 1.5 (calc) (ref 1.0–2.5)
ALT: 64 U/L — ABNORMAL HIGH (ref 6–29)
AST: 37 U/L — ABNORMAL HIGH (ref 10–35)
Albumin: 4.2 g/dL (ref 3.6–5.1)
Alkaline phosphatase (APISO): 104 U/L (ref 37–153)
BUN: 12 mg/dL (ref 7–25)
CO2: 24 mmol/L (ref 20–32)
Calcium: 8.9 mg/dL (ref 8.6–10.4)
Chloride: 103 mmol/L (ref 98–110)
Creat: 0.69 mg/dL (ref 0.50–1.05)
GFR, Est African American: 116 mL/min/{1.73_m2} (ref >=60)
GFR, Est Non African American: 100 mL/min/{1.73_m2} (ref >=60)
Globulin: 2.8 g/dL (calc) (ref 1.9–3.7)
Glucose, Bld: 183 mg/dL — ABNORMAL HIGH (ref 65–99)
Potassium: 3.7 mmol/L (ref 3.5–5.3)
Sodium: 139 mmol/L (ref 135–146)
Total Bilirubin: 0.5 mg/dL (ref 0.2–1.2)
Total Protein: 7 g/dL (ref 6.1–8.1)

## 2020-04-15 LAB — TSH: TSH: 1.49 mIU/L

## 2020-04-15 NOTE — Therapy (Signed)
[place], [place], [address] Phone: [phone]   Fax:  [phone]  Physical Therapy Treatment  Patient Details  Name: Melanie [name] MRN: [id number] Date of Birth: 1966-05-05 Referring Provider (PT): Aundria Mems, MD   Encounter Date: 04/15/2020   PT End of Session - 04/15/20 1534    Visit Number 3    Number of Visits 12    Date for PT Re-Evaluation 05/07/20    Authorization Type UMR    PT Start Time 1440    PT Stop Time 1530    PT Time Calculation (min) 50 min    Behavior During Therapy [facility] for tasks assessed/performed           No past medical history on file.  Past Surgical History:  Procedure Laterality Date  . ABDOMINAL HYSTERECTOMY    . MYOMECTOMY      There were no vitals filed for this visit.   Subjective Assessment - 04/15/20 1442    Subjective Pt reports her neck pain is more centralized. She plans to order a TENS unit.  She had positive response to tape, would like to be retaped.  She states, "I'm really trying to be mindful" of posture and body mechanics.    Currently in Pain? Yes    Pain Score 7     Pain Location Neck    Pain Orientation Right;Left    Pain Descriptors / Indicators Dull    Aggravating Factors  reaching    Pain Relieving Factors medicine, heat              [facility] PT Assessment - 04/15/20 0001      Assessment   Medical Diagnosis cervical DDD    Referring Provider (PT) Aundria Mems, MD    Onset Date/Surgical Date 03/26/20    Hand Dominance Right           [facility] Adult PT Treatment/Exercise - 04/15/20 0001      Neck Exercises: Seated   Cervical Rotation Right;Left;5 reps    Shoulder Rolls Backwards;10 reps      Neck Exercises: Supine   Cervical Rotation Right;Left;5 reps   with head nods.    Other Supine Exercise scap squeeze x 5 sec x 5 reps       Shoulder Exercises: Supine   Flexion AAROM;Both;5 reps   LUE  assisting RUE, to tolerance   Flexion Limitations some increased discomfort reported in posterior shoulder at end of available range, improved with repetition      Shoulder Exercises: Stretch   Star Gazer Stretch 2 reps;20 seconds   back of palms to forehead.    Other Shoulder Stretches L stretch with hands on counter x 15 sec; verbally reviewed midlevel doorway stretch     Other Shoulder Stretches prolonged stretch in hooklying with RUE abdct to 90 deg for pec stretch ~2 min, then R nerve glides with hand off of table.       Modalities   Modalities Traction      Electrical Stimulation   Electrical Stimulation Location Rt upper thoracic erector spinae, upper trap, levator     Electrical Stimulation Action TENS    Electrical Stimulation Parameters 10 min, intensity to toleranc e    Electrical Stimulation Goals Tone;Pain      Traction   Type of Traction Cervical    Min (lbs) 7    Max (lbs) 14    Hold Time 60    Rest Time 20  Time 15   10 min- with ramp up/down     Manual Therapy   Manual therapy comments I strip of sensitive skin Rock tape applled to Rt ant shoulder (bicep brachii origin to mid bicep, I strip to R anatomical snuff box, I strip to mid-thoracic paraspinals  with 10% stretch to decompress tissue and increase proprioception.     Soft tissue mobilization IASTM to Rt ant/lateral prox shoulder and bicep brachii, brachioradialis, wrist flexors to decrease fascial restrictions and improve mobility.                   PT Education - 04/15/20 1715    Education Details HEP - (verbally given) supine cervical rotation with head nods, scap squeeze, AAROM shoulder flexion, star gazers stretch.    Person(s) Educated Patient    Methods Explanation    Comprehension Verbalized understanding               PT Long Term Goals - 03/26/20 1825      PT LONG TERM GOAL #1   Title The patient will be indep with HEP.    Time 6    Period Weeks    Target Date 05/07/20       PT LONG TERM GOAL #2   Title The patient will reduce functional limitation per FOTO from 65% to < or equal to 47%.    Time 6    Period Weeks    Target Date 05/07/20      PT LONG TERM GOAL #3   Title The patient will reduce pain from m7/10 to < or equal to 5/10.    Time 6    Period Weeks    Target Date 05/07/20      PT LONG TERM GOAL #4   Title The patient will improve grip strength R hand to equal L.    Time 6    Period Weeks    Target Date 05/07/20                 Plan - 04/15/20 1713    Clinical Impression Statement Improved tolerance for Rt shoulder flex and abdct in supine position.  Positive response with tape; applied similar place in addition to hand and upper back.  Trial of traction initiated.  Gradually progressing towards goals.    Personal Factors and Comorbidities Comorbidity 2    Comorbidities HTN, hypothyroidism    Examination-Activity Limitations Reach Overhead;Lift;Carry    Examination-Participation Restrictions Occupation;Cleaning;Community Activity    Stability/Clinical Decision Making Stable/Uncomplicated    Rehab Potential Good    PT Frequency 2x / week    PT Duration 6 weeks    PT Treatment/Interventions ADLs/Self Care Home Management;Patient/family education;Therapeutic activities;Therapeutic exercise;Electrical Stimulation;Traction;Moist Heat;Cryotherapy;Taping;Dry needling;Manual techniques;Iontophoresis 33m/ml Dexamethasone    PT Next Visit Plan Progress HEP for gentle AROM of cspine and shoulder; postural strengthening to tolerance.  Assess response to traction.    Consulted and Agree with Plan of Care Patient           Patient will benefit from skilled therapeutic intervention in order to improve the following deficits and impairments:  Pain, Decreased range of motion, Decreased strength, Impaired flexibility, Hypomobility, Impaired sensation, Postural dysfunction, Decreased activity tolerance  Visit Diagnosis: Cervicalgia  Acute pain of  right shoulder  Muscle weakness (generalized)  Other symptoms and signs involving the musculoskeletal system     Problem List Patient Active Problem List   Diagnosis Date Noted  . Acute cystitis without hematuria 06/24/2019  . Tenosynovitis,  de Quervain 06/24/2019  . Primary osteoarthritis of first carpometacarpal joint of right hand 03/18/2019  . Chronic right shoulder pain 03/18/2019  . Bilateral foot pain 03/18/2019  . Primary osteoarthritis of both knees 03/18/2019  . Fibromyalgia 03/15/2019  . Lumbar degenerative disc disease 12/19/2018  . Myalgia 11/27/2018  . Morning stiffness of joints 11/27/2018  . DDD (degenerative disc disease), cervical 11/27/2018  . Swelling of left upper eyelid 09/24/2018  . Attention deficit hyperactivity disorder (ADHD), predominantly inattentive type 02/27/2018  . Arthralgia 02/27/2018  . Screening for malignant neoplasm of colon 02/27/2018  . H/O: hysterectomy 02/27/2018  . History of prediabetes 02/27/2018  . History of hypothyroidism 02/27/2018  . NASH (nonalcoholic steatohepatitis) 02/27/2018  . GAD (generalized anxiety disorder) 01/06/2017  . Moderate episode of recurrent major depressive disorder ([place]) 01/06/2017  . Environmental allergies 09/12/2015  . Essential hypertension 09/12/2015  . OSA on CPAP 09/12/2015   Kerin Perna, PTA 04/15/20 5:21 PM  [place], [place], [address] Phone: [phone]   Fax:  [phone]  Name: Melanie [name] MRN: [id number] Date of Birth: 1966-03-08

## 2020-04-17 ENCOUNTER — Encounter: Payer: Self-pay | Admitting: Medical-Surgical

## 2020-04-17 ENCOUNTER — Ambulatory Visit (INDEPENDENT_AMBULATORY_CARE_PROVIDER_SITE_OTHER): Payer: No Typology Code available for payment source | Admitting: Rehabilitative and Restorative Service Providers"

## 2020-04-17 ENCOUNTER — Other Ambulatory Visit: Payer: Self-pay

## 2020-04-17 DIAGNOSIS — R29898 Other symptoms and signs involving the musculoskeletal system: Secondary | ICD-10-CM | POA: Diagnosis not present

## 2020-04-17 DIAGNOSIS — M542 Cervicalgia: Secondary | ICD-10-CM | POA: Diagnosis not present

## 2020-04-17 DIAGNOSIS — M79641 Pain in right hand: Secondary | ICD-10-CM

## 2020-04-17 DIAGNOSIS — M6281 Muscle weakness (generalized): Secondary | ICD-10-CM

## 2020-04-17 DIAGNOSIS — M25511 Pain in right shoulder: Secondary | ICD-10-CM | POA: Diagnosis not present

## 2020-04-17 NOTE — Patient Instructions (Signed)
Access Code: [email or web address] URL: [email or web address] Date: 04/17/2020 Prepared by: Rudell Cobb  Exercises Supine Scapular Retraction - 2 x daily - 7 x weekly - 1 sets - 5 reps Hooklying Shoulder T - 2 x daily - 7 x weekly - 1 sets - 5 reps Supine Chest Stretch with Elbows Bent - 2 x daily - 7 x weekly - 1 sets - 3 reps - 10-15 seconds hold Sidelying Thoracic Rotation with Open Book - 2 x daily - 7 x weekly - 1 sets - 10 reps Seated Cervical Rotation AROM - 2 x daily - 7 x weekly - 1 sets - 10 reps

## 2020-04-17 NOTE — Therapy (Signed)
[place]  [place], [place], [address] Phone: [phone]   Fax:  [phone]  Physical Therapy Treatment  Patient Details  Name: Melanie [name] MRN: [id number] Date of Birth: [birth year] Referring Provider (PT): Aundria Mems, MD   Encounter Date: 04/17/2020   PT End of Session - 04/17/20 1609    Visit Number 4    Number of Visits 12    Date for PT Re-Evaluation 05/07/20    Authorization Type UMR    PT Start Time 1315    PT Stop Time 1408    PT Time Calculation (min) 53 min    Activity Tolerance Patient limited by pain    Behavior During Therapy [facility] for tasks assessed/performed           No past medical history on file.  Past Surgical History:  Procedure Laterality Date  . ABDOMINAL HYSTERECTOMY    . MYOMECTOMY      There were no vitals filed for this visit.   Subjective Assessment - 04/17/20 1322    Subjective The patient reports she had more mobility in her neck yesterday after doing traction.  She has increased pain today.    Patient Stated Goals "Not be aware of my right arm constantly and improve range of motion."    Currently in Pain? Yes    Pain Score 7     Pain Location Neck    Pain Orientation Right;Left    Pain Descriptors / Indicators Dull;Aching;Sore    Pain Radiating Towards spot in between shoulder blade and spine    Pain Onset More than a month ago    Pain Frequency Constant    Aggravating Factors  reaching    Pain Relieving Factors medicine, heat              [facility] PT Assessment - 04/17/20 1324      Assessment   Medical Diagnosis cervical DDD    Referring Provider (PT) Aundria Mems, MD    Onset Date/Surgical Date 03/26/20    Hand Dominance Right                         [facility] Adult PT Treatment/Exercise - 04/17/20 1324      Self-Care   Self-Care Other Self-Care Comments    Other Self-Care Comments  discussed use of modalities  with patient; she inquired about massage and chiropractic care.  PT discussed short term benefits of interventions, however long term success will come when she can tolerate movement and exercise.  PT explained role of modalities at our clinic as a way to reduce pain enough that she can tolerate ther ex.  She requests dry needling, tape, traction, e-stim.  PT reviewed that home exercise and movement is key to reducing long term disability.       Exercises   Exercises Shoulder;Wrist      Neck Exercises: Seated   Cervical Rotation Right;Left;5 reps      Neck Exercises: Supine   Cervical Rotation Right;Left;5 reps      Shoulder Exercises: Supine   Horizontal ABduction AROM;Both;10 reps    Flexion AROM;Right;5 reps    Other Supine Exercises supine scapular retraction initially with submaximal effort to improve tolerance and increased as patient able to tolerate      Shoulder Exercises: Sidelying   Other Sidelying Exercises thoracic mobilization with open book x 10 reps R and L with some facilitated ROM  Shoulder Exercises: ROM/Strengthening   UBE (Upper Arm Bike) L1 x x 90 seconds forward and 60 seconds backward      Shoulder Exercises: Stretch   Star Gazer Stretch 2 reps;20 seconds      Traction   Type of Traction Cervical    Min (lbs) 10    Max (lbs) 15    Hold Time 60    Rest Time 20    Time 15      Manual Therapy   Manual therapy comments I strip bicipital groove, I strip R thoracic/ parascapular mob, I strip teres region, Y strip EPB, AP                  PT Education - 04/17/20 1609    Education Details HEP progression adding written instruction    Person(s) Educated Patient    Methods Explanation;Demonstration;Handout    Comprehension Verbalized understanding;Returned demonstration               PT Long Term Goals - 03/26/20 1825      PT LONG TERM GOAL #1   Title The patient will be indep with HEP.    Time 6    Period Weeks    Target Date  05/07/20      PT LONG TERM GOAL #2   Title The patient will reduce functional limitation per FOTO from 65% to < or equal to 47%.    Time 6    Period Weeks    Target Date 05/07/20      PT LONG TERM GOAL #3   Title The patient will reduce pain from m7/10 to < or equal to 5/10.    Time 6    Period Weeks    Target Date 05/07/20      PT LONG TERM GOAL #4   Title The patient will improve grip strength R hand to equal L.    Time 6    Period Weeks    Target Date 05/07/20                 Plan - 04/17/20 1401    Clinical Impression Statement The patient has dec'd tolerance to exercise.  We are using modalities to reduce pain in order to allow for further exercise.  The patient reported she has stopped working due to pain, disability and is trying to focus on improving her health.  PT recommended she begin with home walking in addition to ther ex provided.    PT Treatment/Interventions ADLs/Self Care Home Management;Patient/family education;Therapeutic activities;Therapeutic exercise;Electrical Stimulation;Traction;Moist Heat;Cryotherapy;Taping;Dry needling;Manual techniques;Iontophoresis 7m/ml Dexamethasone    PT Next Visit Plan Progress HEP for gentle AROM of cspine and shoulder; postural strengthening to tolerance.    PT Home Exercise Plan Access Code: [email or web address]   Consulted and Agree with Plan of Care Patient           Patient will benefit from skilled therapeutic intervention in order to improve the following deficits and impairments:  Pain, Decreased range of motion, Decreased strength, Impaired flexibility, Hypomobility, Impaired sensation, Postural dysfunction, Decreased activity tolerance  Visit Diagnosis: Cervicalgia  Acute pain of right shoulder  Muscle weakness (generalized)  Other symptoms and signs involving the musculoskeletal system  Pain in right hand     Problem List Patient Active Problem List   Diagnosis Date Noted  . Acute cystitis without hematuria  06/24/2019  . Tenosynovitis, de Quervain 06/24/2019  . Primary osteoarthritis of first carpometacarpal joint of right hand 03/18/2019  . Chronic right  shoulder pain 03/18/2019  . Bilateral foot pain 03/18/2019  . Primary osteoarthritis of both knees 03/18/2019  . Fibromyalgia 03/15/2019  . Lumbar degenerative disc disease 12/19/2018  . Myalgia 11/27/2018  . Morning stiffness of joints 11/27/2018  . DDD (degenerative disc disease), cervical 11/27/2018  . Swelling of left upper eyelid 09/24/2018  . Attention deficit hyperactivity disorder (ADHD), predominantly inattentive type 02/27/2018  . Arthralgia 02/27/2018  . Screening for malignant neoplasm of colon 02/27/2018  . H/O: hysterectomy 02/27/2018  . History of prediabetes 02/27/2018  . History of hypothyroidism 02/27/2018  . NASH (nonalcoholic steatohepatitis) 02/27/2018  . GAD (generalized anxiety disorder) 01/06/2017  . Moderate episode of recurrent major depressive disorder ([place]) 01/06/2017  . Environmental allergies 09/12/2015  . Essential hypertension 09/12/2015  . OSA on CPAP 09/12/2015    Freddye Cardamone, PT 04/17/2020, 5:07 PM  [facility] [place], [place], [address] Phone: [phone]   Fax:  [phone]  Name: Melanie [name] MRN: [id number] Date of Birth: 13-Oct-1965

## 2020-04-18 ENCOUNTER — Ambulatory Visit (INDEPENDENT_AMBULATORY_CARE_PROVIDER_SITE_OTHER): Payer: No Typology Code available for payment source

## 2020-04-18 DIAGNOSIS — M7551 Bursitis of right shoulder: Secondary | ICD-10-CM

## 2020-04-18 DIAGNOSIS — M1711 Unilateral primary osteoarthritis, right knee: Secondary | ICD-10-CM | POA: Diagnosis not present

## 2020-04-18 DIAGNOSIS — M170 Bilateral primary osteoarthritis of knee: Secondary | ICD-10-CM

## 2020-04-18 DIAGNOSIS — M19011 Primary osteoarthritis, right shoulder: Secondary | ICD-10-CM | POA: Diagnosis not present

## 2020-04-21 ENCOUNTER — Other Ambulatory Visit: Payer: Self-pay

## 2020-04-21 ENCOUNTER — Ambulatory Visit (INDEPENDENT_AMBULATORY_CARE_PROVIDER_SITE_OTHER): Payer: No Typology Code available for payment source | Admitting: Rehabilitative and Restorative Service Providers"

## 2020-04-21 DIAGNOSIS — M25511 Pain in right shoulder: Secondary | ICD-10-CM | POA: Diagnosis not present

## 2020-04-21 DIAGNOSIS — M6281 Muscle weakness (generalized): Secondary | ICD-10-CM

## 2020-04-21 DIAGNOSIS — M542 Cervicalgia: Secondary | ICD-10-CM

## 2020-04-21 DIAGNOSIS — R29898 Other symptoms and signs involving the musculoskeletal system: Secondary | ICD-10-CM | POA: Diagnosis not present
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
## 2020-04-21 NOTE — Patient Instructions (Signed)
Access Code: [email or web address] URL: [email or web address] Date: 04/21/2020 Prepared by: Rudell Cobb  Exercises Hooklying Shoulder T - 2 x daily - 7 x weekly - 1 sets - 5 reps Supine Chest Stretch with Elbows Bent - 2 x daily - 7 x weekly - 1 sets - 3 reps - 10-15 seconds hold Sidelying Thoracic Rotation with Open Book - 2 x daily - 7 x weekly - 1 sets - 10 reps Seated Cervical Rotation AROM - 2 x daily - 7 x weekly - 1 sets - 10 reps Seated Shoulder Rolls - 2 x daily - 7 x weekly - 1 sets - 10 reps Standing Anatomical Position with Scapular Retraction and Depression at Wall - 2 x daily - 7 x weekly - 1 sets - 10 reps

## 2020-04-23 ENCOUNTER — Ambulatory Visit: Payer: No Typology Code available for payment source | Admitting: Sports Medicine

## 2020-04-24 ENCOUNTER — Other Ambulatory Visit: Payer: Self-pay | Admitting: Sports Medicine

## 2020-04-24 ENCOUNTER — Other Ambulatory Visit: Payer: Self-pay

## 2020-04-24 ENCOUNTER — Telehealth: Payer: Self-pay

## 2020-04-24 ENCOUNTER — Encounter: Payer: Self-pay | Admitting: Medical-Surgical

## 2020-04-24 ENCOUNTER — Other Ambulatory Visit (HOSPITAL_BASED_OUTPATIENT_CLINIC_OR_DEPARTMENT_OTHER): Payer: Self-pay | Admitting: Medical-Surgical

## 2020-04-24 ENCOUNTER — Ambulatory Visit: Payer: No Typology Code available for payment source | Admitting: Rehabilitative and Restorative Service Providers"

## 2020-04-24 ENCOUNTER — Ambulatory Visit (INDEPENDENT_AMBULATORY_CARE_PROVIDER_SITE_OTHER): Payer: No Typology Code available for payment source | Admitting: Medical-Surgical

## 2020-04-24 VITALS — BP 136/91 | HR 82 | Temp 97.9°F | Ht 67.0 in | Wt 224.6 lb

## 2020-04-24 DIAGNOSIS — M25511 Pain in right shoulder: Secondary | ICD-10-CM

## 2020-04-24 DIAGNOSIS — E669 Obesity, unspecified: Secondary | ICD-10-CM | POA: Diagnosis not present

## 2020-04-24 DIAGNOSIS — M542 Cervicalgia: Secondary | ICD-10-CM

## 2020-04-24 DIAGNOSIS — Z1211 Encounter for screening for malignant neoplasm of colon: Secondary | ICD-10-CM

## 2020-04-24 DIAGNOSIS — M6281 Muscle weakness (generalized): Secondary | ICD-10-CM

## 2020-04-24 DIAGNOSIS — E119 Type 2 diabetes mellitus without complications: Secondary | ICD-10-CM | POA: Diagnosis not present

## 2020-04-24 DIAGNOSIS — F900 Attention-deficit hyperactivity disorder, predominantly inattentive type: Secondary | ICD-10-CM

## 2020-04-24 DIAGNOSIS — I10 Essential (primary) hypertension: Secondary | ICD-10-CM | POA: Diagnosis not present

## 2020-04-24 DIAGNOSIS — K7581 Nonalcoholic steatohepatitis (NASH): Secondary | ICD-10-CM

## 2020-04-24 DIAGNOSIS — R29898 Other symptoms and signs involving the musculoskeletal system: Secondary | ICD-10-CM | POA: Diagnosis not present

## 2020-04-24 DIAGNOSIS — Z9989 Dependence on other enabling machines and devices: Secondary | ICD-10-CM

## 2020-04-24 DIAGNOSIS — G4733 Obstructive sleep apnea (adult) (pediatric): Secondary | ICD-10-CM

## 2020-04-24 DIAGNOSIS — M797 Fibromyalgia: Secondary | ICD-10-CM

## 2020-04-24 MED ORDER — FLUTICASONE PROPIONATE 50 MCG/ACT NA SUSP: NASAL | 3 refills | Status: DC

## 2020-04-24 MED ORDER — SEMAGLUTIDE-WEIGHT MANAGEMENT 1.7 MG/0.75ML ~~LOC~~ SOAJ: 1.7000 mg | SUBCUTANEOUS | 0 refills | Status: AC

## 2020-04-24 MED ORDER — SEMAGLUTIDE-WEIGHT MANAGEMENT 0.5 MG/0.5ML ~~LOC~~ SOAJ: 0.5000 mg | SUBCUTANEOUS | 0 refills | Status: AC

## 2020-04-24 MED ORDER — BLOOD GLUCOSE MONITOR KIT: PACK | 0 refills | Status: AC

## 2020-04-24 MED ORDER — SEMAGLUTIDE-WEIGHT MANAGEMENT 2.4 MG/0.75ML ~~LOC~~ SOAJ: 2.4000 mg | SUBCUTANEOUS | 1 refills | Status: AC

## 2020-04-24 MED ORDER — PRAVASTATIN SODIUM 20 MG PO TABS: 20.0000 mg | ORAL_TABLET | Freq: Every day | ORAL | 1 refills | Status: DC

## 2020-04-24 MED ORDER — LOSARTAN POTASSIUM 25 MG PO TABS: 25.0000 mg | ORAL_TABLET | Freq: Every day | ORAL | 1 refills | Status: DC

## 2020-04-24 MED ORDER — LISDEXAMFETAMINE DIMESYLATE 50 MG PO CAPS: 50.0000 mg | ORAL_CAPSULE | Freq: Every day | ORAL | 0 refills | Status: DC

## 2020-04-24 MED ORDER — SEMAGLUTIDE-WEIGHT MANAGEMENT 1 MG/0.5ML ~~LOC~~ SOAJ: 1.0000 mg | SUBCUTANEOUS | 0 refills | Status: AC

## 2020-04-24 MED ORDER — SEMAGLUTIDE-WEIGHT MANAGEMENT 0.25 MG/0.5ML ~~LOC~~ SOAJ: 0.2500 mg | SUBCUTANEOUS | 0 refills | Status: AC

## 2020-04-24 MED FILL — FLUTICASONE PROP 50 MCG SPR: 50 | 90 days supply | Qty: 48 | Fill #0

## 2020-04-24 MED FILL — FREESTYLE LITE TEST STRIP: 50 days supply | Qty: 200 | Fill #0

## 2020-04-24 MED FILL — FREESTYLE LITE METER: W/DEVICE | 1 days supply | Qty: 1 | Fill #0

## 2020-04-24 MED FILL — LOSARTAN POTASSIUM 25 MG TA: 25 | 90 days supply | Qty: 90 | Fill #0

## 2020-04-24 MED FILL — FREESTYLE LANCETS: 50 days supply | Qty: 200 | Fill #0

## 2020-04-24 MED FILL — PRAVASTATIN NA 20 MG TAB: 20 | 60 days supply | Qty: 60 | Fill #0

## 2020-04-24 NOTE — Patient Instructions (Signed)
Access Code: [email or web address] URL: [email or web address] Date: 04/24/2020 Prepared by: Rudell Cobb  Exercises Hooklying Shoulder T - 2 x daily - 7 x weekly - 1 sets - 5 reps Supine Chest Stretch with Elbows Bent - 2 x daily - 7 x weekly - 1 sets - 3 reps - 10-15 seconds hold Sidelying Thoracic Rotation with Open Book - 2 x daily - 7 x weekly - 1 sets - 10 reps Seated Cervical Rotation AROM - 2 x daily - 7 x weekly - 1 sets - 10 reps Standing Anatomical Position with Scapular Retraction and Depression at Wall - 2 x daily - 7 x weekly - 1 sets - 10 reps Standing Shoulder Extension with [place] - 2 x daily - 7 x weekly - 1 sets - 10 reps Radial Nerve Tensioner - 2 x daily - 7 x weekly - 1 sets - 5 reps Standing Median Nerve Glide - 2 x daily - 7 x weekly - 1 sets - 5 reps

## 2020-04-24 NOTE — Telephone Encounter (Signed)
Ok with me 
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
## 2020-04-24 NOTE — Telephone Encounter (Signed)
Pt is requesting to change PCP's within the practice:   Current PCP: Dr. Sheppard Coil Requested PCP: Samuel Bouche, FNP   Please advise regarding approval/denial.

## 2020-04-24 NOTE — Telephone Encounter (Signed)
Looks like patient saw Melanie [name] today, if she has a personal preference for Joy, that is fine with me if she would like to switch.  If she has any particular concerns otherwise, would direct to practice admin.

## 2020-04-27 NOTE — Telephone Encounter (Signed)
Pt aware. PCP changed in chart.

## 2020-04-28 ENCOUNTER — Encounter (HOSPITAL_BASED_OUTPATIENT_CLINIC_OR_DEPARTMENT_OTHER): Payer: Self-pay | Admitting: *Deleted

## 2020-04-28 ENCOUNTER — Other Ambulatory Visit: Payer: Self-pay

## 2020-04-28 ENCOUNTER — Inpatient Hospital Stay (HOSPITAL_BASED_OUTPATIENT_CLINIC_OR_DEPARTMENT_OTHER)
Admission: EM | Admit: 2020-04-28 | Discharge: 2020-04-30 | DRG: 390 | Disposition: A | Discharge status: Home / Self Care | Payer: No Typology Code available for payment source | Attending: Internal Medicine | Admitting: Internal Medicine

## 2020-04-28 DIAGNOSIS — Z7982 Long term (current) use of aspirin: Secondary | ICD-10-CM

## 2020-04-28 DIAGNOSIS — E039 Hypothyroidism, unspecified: Secondary | ICD-10-CM | POA: Diagnosis present

## 2020-04-28 DIAGNOSIS — Z79899 Other long term (current) drug therapy: Secondary | ICD-10-CM

## 2020-04-28 DIAGNOSIS — Z20822 Contact with and (suspected) exposure to covid-19: Secondary | ICD-10-CM | POA: Diagnosis present

## 2020-04-28 DIAGNOSIS — K56609 Unspecified intestinal obstruction, unspecified as to partial versus complete obstruction: Secondary | ICD-10-CM | POA: Diagnosis present

## 2020-04-28 DIAGNOSIS — E119 Type 2 diabetes mellitus without complications: Secondary | ICD-10-CM | POA: Diagnosis present

## 2020-04-28 DIAGNOSIS — E785 Hyperlipidemia, unspecified: Secondary | ICD-10-CM | POA: Diagnosis present

## 2020-04-28 DIAGNOSIS — K5790 Diverticulosis of intestine, part unspecified, without perforation or abscess without bleeding: Secondary | ICD-10-CM

## 2020-04-28 DIAGNOSIS — I10 Essential (primary) hypertension: Secondary | ICD-10-CM | POA: Diagnosis present

## 2020-04-28 DIAGNOSIS — Z90710 Acquired absence of both cervix and uterus: Secondary | ICD-10-CM

## 2020-04-28 DIAGNOSIS — K56600 Partial intestinal obstruction, unspecified as to cause: Secondary | ICD-10-CM | POA: Diagnosis not present

## 2020-04-28 DIAGNOSIS — R109 Unspecified abdominal pain: Secondary | ICD-10-CM | POA: Diagnosis not present

## 2020-04-28 HISTORY — DX: Unspecified intestinal obstruction, unspecified as to partial versus complete obstruction: K56.609

## 2020-04-28 HISTORY — DX: Essential (primary) hypertension: I10

## 2020-04-28 HISTORY — DX: Type 2 diabetes mellitus without complications: E11.9

## 2020-04-28 LAB — CBC WITH DIFFERENTIAL/PLATELET
Abs Immature Granulocytes: 0.02 10*3/uL (ref 0.00–0.07)
Basophils Absolute: 0.1 10*3/uL (ref 0.0–0.1)
Basophils Relative: 1 %
Eosinophils Absolute: 0.2 10*3/uL (ref 0.0–0.5)
Eosinophils Relative: 2 %
HCT: 39.9 % (ref 36.0–46.0)
Hemoglobin: 13.8 g/dL (ref 12.0–15.0)
Immature Granulocytes: 0 %
Lymphocytes Relative: 20 %
Lymphs Abs: 1.8 10*3/uL (ref 0.7–4.0)
MCH: 30.3 pg (ref 26.0–34.0)
MCHC: 34.6 g/dL (ref 30.0–36.0)
MCV: 87.7 fL (ref 80.0–100.0)
Monocytes Absolute: 0.7 10*3/uL (ref 0.1–1.0)
Monocytes Relative: 8 %
Neutro Abs: 6 10*3/uL (ref 1.7–7.7)
Neutrophils Relative %: 69 %
Platelets: 333 10*3/uL (ref 150–400)
RBC: 4.55 MIL/uL (ref 3.87–5.11)
RDW: 11.7 % (ref 11.5–15.5)
WBC: 8.7 10*3/uL (ref 4.0–10.5)
nRBC: 0 % (ref 0.0–0.2)

## 2020-04-28 LAB — URINALYSIS, ROUTINE W REFLEX MICROSCOPIC
Bilirubin Urine: NEGATIVE
Glucose, UA: NEGATIVE mg/dL
Hgb urine dipstick: NEGATIVE
Ketones, ur: NEGATIVE mg/dL
Leukocytes,Ua: NEGATIVE
Nitrite: NEGATIVE
Protein, ur: NEGATIVE mg/dL
Specific Gravity, Urine: 1.01 (ref 1.005–1.030)
pH: 6 (ref 5.0–8.0)

## 2020-04-28 MED ORDER — ONDANSETRON HCL 4 MG/2ML IJ SOLN
4.0000 mg | Freq: Once | INTRAMUSCULAR | Status: AC
  Administered 2020-04-28: 4 mg via INTRAVENOUS
  Filled 2020-04-28: qty 2

## 2020-04-28 MED ORDER — FENTANYL CITRATE (PF) 100 MCG/2ML IJ SOLN
100.0000 ug | INTRAMUSCULAR | Status: DC | PRN
  Administered 2020-04-28 – 2020-04-29 (×2): 50 ug via INTRAVENOUS
  Filled 2020-04-28 (×3): qty 2

## 2020-04-28 NOTE — ED Triage Notes (Signed)
C/o lower abd pain x 1 day

## 2020-04-28 NOTE — ED Provider Notes (Signed)
[facility] Provider Note: Georgena Spurling, MD, FACEP  CSN: [id number] MRN: [id number] ARRIVAL: 04/28/20 at 2311 ROOM: [place]  Abdominal Pain   HISTORY OF PRESENT ILLNESS  04/28/20 11:23 PM Melanie [name] is a 54 y.o. 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  She is here with abdominal pain that began earlier today and has worsened throughout the day.  She describes the pain as cramping in nature and primarily in her lower abdomen although it has generalized times.  It has been waxing and waning and has been an 8 out of 10 at times although not as severe presently.  She has had nausea but no vomiting.  She has had waxing and waning abdominal distention as well.  She had a large bowel movement this morning but has subsequently had decreased bowel movements.  Pain is worse with movement or certain positions.   Past Medical History:  Diagnosis Date  . Bowel obstruction ([place])   . Diabetes mellitus without complication ([place])   . Hypertension     Past Surgical History:  Procedure Laterality Date  . ABDOMINAL HYSTERECTOMY    . MYOMECTOMY      Family History  Problem Relation Age of Onset  . Lung cancer Father   . Diabetes Mother   . Skin cancer Mother     Social History   Tobacco Use  . Smoking status: Never Smoker  . Smokeless tobacco: Never Used  Vaping Use  . Vaping Use: Never used  Substance Use Topics  . Alcohol use: Yes    Comment: Occassionally  . Drug use: Never    Prior to Admission medications   Medication Sig Start Date End Date Taking? Authorizing Provider  aspirin EC 81 MG tablet Take 81 mg by mouth daily.    [provider]  blood glucose meter kit and supplies KIT Dispense based on patient and insurance preference. Use up to four times daily as directed. Please include lancets, test strips, control solution. 04/24/20   Samuel Bouche, NP  buPROPion (WELLBUTRIN XL) 150 MG 24 hr tablet Take  2 tablets (300 mg total) by mouth daily. Patient taking differently: Take 150 mg by mouth daily.  12/05/19   Samuel Bouche, NP  celecoxib (CELEBREX) 200 MG capsule TAKE 1 TO 2 CAPSULES BY MOUTH DAILY AS NEEDED FOR PAIN 02/25/20   Samuel Bouche, NP  fluticasone (FLONASE) 50 MCG/ACT nasal spray PLACE 1 SPRAY IN EACH NOSTRIL TWO TIMES DAILY. **USE LEFT HAND FOR RIGHT NOSTRIL AND RIGHT HAND FOR LEFT NOSTRIL** 04/24/20   Jessup, Joy, NP  ipratropium (ATROVENT) 0.03 % nasal spray INSTILL 2 SPRAYS INTO EACH NOSTRIL EVERY 12 HOURS. 12/05/19   Samuel Bouche, NP  levocetirizine (XYZAL) 5 MG tablet Take 1 tablet (5 mg total) by mouth every evening. 10/09/19   Emeterio Reeve, DO  lisdexamfetamine (VYVANSE) 50 MG capsule Take 1 capsule (50 mg total) by mouth daily. 04/24/20   Samuel Bouche, NP  losartan (COZAAR) 25 MG tablet Take 1 tablet (25 mg total) by mouth daily. 04/24/20   Samuel Bouche, NP  MULTIPLE VITAMIN PO Take 1 tablet by mouth daily.    [provider]  Olopatadine HCl 0.2 % SOLN INSTILL 1 DROP INTO EACH EYE ONCE DAILY. 02/07/20   Samuel Bouche, NP  pravastatin (PRAVACHOL) 20 MG tablet Take 1 tablet (20 mg total) by mouth daily. 04/24/20   Samuel Bouche, NP  pregabalin (LYRICA) 25 MG capsule 1 capsule p.o.  nightly for a week then twice a day for a week then three times a day. 04/14/20   Silverio Decamp, MD  Semaglutide-Weight Management 0.25 MG/0.5ML SOAJ Inject 0.25 mg into the skin once a week for 28 days. 04/24/20 05/22/20  Samuel Bouche, NP  Semaglutide-Weight Management 0.5 MG/0.5ML SOAJ Inject 0.5 mg into the skin once a week for 28 days. 05/23/20 06/20/20  Samuel Bouche, NP  Semaglutide-Weight Management 1 MG/0.5ML SOAJ Inject 1 mg into the skin once a week for 28 days. 06/21/20 07/19/20  Samuel Bouche, NP  Semaglutide-Weight Management 1.7 MG/0.75ML SOAJ Inject 1.7 mg into the skin once a week for 28 days. 07/20/20 08/17/20  Samuel Bouche, NP  Semaglutide-Weight Management 2.4 MG/0.75ML SOAJ Inject 2.4 mg  into the skin once a week for 28 days. 08/18/20 09/15/20  Samuel Bouche, NP  traZODone (DESYREL) 50 MG tablet Take 0.5-1 tablets (25-50 mg total) by mouth at bedtime as needed for sleep. 02/25/20   Samuel Bouche, NP    Allergies Clarithromycin, Morphine, and Sulfa antibiotics   REVIEW OF SYSTEMS  Negative except as noted here or in the History of Present Illness.   PHYSICAL EXAMINATION  Initial Vital Signs Blood pressure (!) 147/97, pulse 81, temperature 98.1 F (36.7 C), temperature source Oral, resp. rate 18, height 5' 7.5" (1.715 m), weight 99.8 kg, SpO2 100 %.  Examination General: Well-developed, well-nourished female in no acute distress; appearance consistent with age of record HENT: normocephalic; atraumatic Eyes: pupils equal, round and reactive to light; extraocular muscles intact Neck: supple Heart: regular rate and rhythm Lungs: clear to auscultation bilaterally Abdomen: soft; distended; suprapubic tenderness; bowel sounds hypoactive Extremities: No deformity; full range of motion; pulses normal Neurologic: Awake, alert and oriented; motor function intact in all extremities and symmetric; no facial droop Skin: Warm and dry Psychiatric: Normal mood and affect   RESULTS  Summary of this visit's results, reviewed and interpreted by myself:   EKG Interpretation  Date/Time:    Ventricular Rate:    PR Interval:    QRS Duration:   QT Interval:    QTC Calculation:   R Axis:     Text Interpretation:        Laboratory Studies: Results for orders placed or performed during the hospital encounter of 04/28/20 (from the past 24 hour(s))  CBC with Differential/Platelet     Status: None   Collection Time: 04/28/20 11:43 PM  Result Value Ref Range   WBC 8.7 4.0 - 10.5 K/uL   RBC 4.55 3.87 - 5.11 MIL/uL   Hemoglobin 13.8 12.0 - 15.0 g/dL   HCT 39.9 36 - 46 %   MCV 87.7 80.0 - 100.0 fL   MCH 30.3 26.0 - 34.0 pg   MCHC 34.6 30.0 - 36.0 g/dL   RDW 11.7 11.5 - 15.5 %    Platelets 333 150 - 400 K/uL   nRBC 0.0 0.0 - 0.2 %   Neutrophils Relative % 69 %   Neutro Abs 6.0 1.7 - 7.7 K/uL   Lymphocytes Relative 20 %   Lymphs Abs 1.8 0.7 - 4.0 K/uL   Monocytes Relative 8 %   Monocytes Absolute 0.7 0.1 - 1.0 K/uL   Eosinophils Relative 2 %   Eosinophils Absolute 0.2 0.0 - 0.5 K/uL   Basophils Relative 1 %   Basophils Absolute 0.1 0.0 - 0.1 K/uL   Immature Granulocytes 0 %   Abs Immature Granulocytes 0.02 0.00 - 0.07 K/uL  Comprehensive metabolic panel  Status: Abnormal   Collection Time: 04/28/20 11:43 PM  Result Value Ref Range   Sodium 136 135 - 145 mmol/L   Potassium 3.4 (L) 3.5 - 5.1 mmol/L   Chloride 102 98 - 111 mmol/L   CO2 24 22 - 32 mmol/L   Glucose, Bld 127 (H) 70 - 99 mg/dL   BUN 14 6 - 20 mg/dL   Creatinine, Ser 0.69 0.44 - 1.00 mg/dL   Calcium 8.9 8.9 - 10.3 mg/dL   Total Protein 7.3 6.5 - 8.1 g/dL   Albumin 4.1 3.5 - 5.0 g/dL   AST 49 (H) 15 - 41 U/L   ALT 69 (H) 0 - 44 U/L   Alkaline Phosphatase 70 38 - 126 U/L   Total Bilirubin 0.9 0.3 - 1.2 mg/dL   GFR, Estimated >60 >60 mL/min   Anion gap 10 5 - 15  Urinalysis, Routine w reflex microscopic Urine, Clean Catch     Status: None   Collection Time: 04/28/20 11:43 PM  Result Value Ref Range   Color, Urine YELLOW YELLOW   APPearance CLEAR CLEAR   Specific Gravity, Urine 1.010 1.005 - 1.030   pH 6.0 5.0 - 8.0   Glucose, UA NEGATIVE NEGATIVE mg/dL   Hgb urine dipstick NEGATIVE NEGATIVE   Bilirubin Urine NEGATIVE NEGATIVE   Ketones, ur NEGATIVE NEGATIVE mg/dL   Protein, ur NEGATIVE NEGATIVE mg/dL   Nitrite NEGATIVE NEGATIVE   Leukocytes,Ua NEGATIVE NEGATIVE  Respiratory Panel by RT PCR (Flu A&B, Covid) - Nasopharyngeal Swab     Status: None   Collection Time: 04/29/20  1:39 AM   Specimen: Nasopharyngeal Swab  Result Value Ref Range   SARS Coronavirus 2 by RT PCR NEGATIVE NEGATIVE   Influenza A by PCR NEGATIVE NEGATIVE   Influenza B by PCR NEGATIVE NEGATIVE   Imaging  Studies: CT ABDOMEN PELVIS W CONTRAST  Result Date: 04/29/2020 CLINICAL DATA:  Lower abdominal pain for 1 day. Bowel obstruction suspected. EXAM: CT ABDOMEN AND PELVIS WITH CONTRAST TECHNIQUE: Multidetector CT imaging of the abdomen and pelvis was performed using the standard protocol following bolus administration of intravenous contrast. CONTRAST:  13m OMNIPAQUE IOHEXOL 300 MG/ML  SOLN COMPARISON:  None. FINDINGS: Lower chest: The lung bases are clear. Hepatobiliary: Diffusely decreased hepatic density consistent with steatosis. Mild focal fatty sparing adjacent the gallbladder fossa. Possible small layering gallstone without pericholecystic inflammation. No biliary dilatation. Pancreas: No ductal dilatation or inflammation. Spleen: Normal in size without focal abnormality. Adrenals/Urinary Tract: Normal adrenal glands. No hydronephrosis or perinephric edema. Homogeneous renal enhancement with symmetric excretion on delayed phase imaging. Urinary bladder is partially distended without wall thickening. Stomach/Bowel: Dilated edematous loop of small bowel in the midline pelvis with wall thickening and adjacent mesenteric edema, just proximal to enteric chain sutures where there is a transition point just to the right of midline, series 2, image 70. Dilated small bowel is short segment, however the more proximal small bowel appear fluid-filled. The distal small bowel are decompressed. There is no pneumatosis or perforation. Mesenteric edema from the small bowel inflammation abuts the sigmoid colon in the region of innumerable colonic diverticula, however inflammation related small bowel process is favored rather than diverticulitis. Some areas of sigmoid colonic wall thickening is nonspecific, query underlying mural hypertrophy or reactive. There is rather extensive diffuse colonic diverticulosis, severe in the sigmoid. No abnormal gastric distension. Appendix not visualized, suspected appendectomy with chain  sutures noted at the bases cecum. Vascular/Lymphatic: Mild aortic atherosclerosis. No aortic aneurysm. Patent portal vein.  Multiple small retroperitoneal and central mesenteric lymph nodes, likely reactive. Reproductive: Status post hysterectomy. No adnexal masses. Other: Mesenteric edema and small amount of free fluid in the pelvis. No perforation or abscess. Postsurgical changes the anterior abdominal wall. In small fat containing umbilical hernia. Musculoskeletal: Degenerative disc disease at L5-S1. Mild scoliotic curvature of the lumbar spine. There are no acute or suspicious osseous abnormalities. IMPRESSION: 1. Findings consistent with early small bowel obstruction. Dilated edematous loop of small bowel in the midline pelvis with wall thickening and adjacent mesenteric edema, just proximal to enteric chain sutures where there is a transition point. No perforation or abscess. 2. Extensive diffuse colonic diverticulosis, severe in the sigmoid. Some areas of sigmoid colonic wall thickening is nonspecific, query underlying mural hypertrophy or reactive. 3. Hepatic steatosis. 4. Possible small layering gallstone without pericholecystic inflammation. Aortic Atherosclerosis (ICD10-I70.0). Electronically Signed   By: Keith Rake [name]   On: 04/29/2020 01:29    ED COURSE and MDM  Nursing notes, initial and subsequent vitals signs, including pulse oximetry, reviewed and interpreted by myself.  Vitals:   04/28/20 2318 04/28/20 2320 04/29/20 0000  BP:  (!) 147/97 (!) 146/93  Pulse:  81 76  Resp:  18 16  Temp:  98.1 F (36.7 C)   TempSrc:  Oral   SpO2:  100% 98%  Weight: 99.8 kg    Height: 5' 7.5" (1.715 m)     Medications  fentaNYL (SUBLIMAZE) injection 100 mcg (50 mcg Intravenous Given 04/28/20 2343)  ondansetron (ZOFRAN) injection 4 mg (4 mg Intravenous Given 04/28/20 2343)  iohexol (OMNIPAQUE) 300 MG/ML solution 100 mL (100 mLs Intravenous Contrast Given 04/29/20 0104)  0.9 %  sodium chloride  infusion ( Intravenous New Bag/Given 04/29/20 0143)   1:41 AM Will have patient admitted to hospitalist service for small bowel obstruction.  Will consult general surgery as well.  Patient made n.p.o.   3:30 AM Dr. Hal Hope accepts for admission to the hospitalist service. Dr. Barry Dienes consulted for general surgery.   PROCEDURES  Procedures   ED DIAGNOSES     ICD-10-CM   1. Small bowel obstruction ([place])  K56.609   2. Diverticulosis  K57.90        Alcides Nutting, MD 04/29/20 0330

## 2020-04-29 ENCOUNTER — Encounter (HOSPITAL_BASED_OUTPATIENT_CLINIC_OR_DEPARTMENT_OTHER): Payer: Self-pay | Admitting: Radiology

## 2020-04-29 ENCOUNTER — Encounter: Payer: Self-pay | Admitting: Medical-Surgical

## 2020-04-29 ENCOUNTER — Emergency Department (HOSPITAL_BASED_OUTPATIENT_CLINIC_OR_DEPARTMENT_OTHER): Payer: No Typology Code available for payment source

## 2020-04-29 DIAGNOSIS — K56609 Unspecified intestinal obstruction, unspecified as to partial versus complete obstruction: Secondary | ICD-10-CM | POA: Diagnosis not present

## 2020-04-29 DIAGNOSIS — Z7982 Long term (current) use of aspirin: Secondary | ICD-10-CM | POA: Diagnosis not present

## 2020-04-29 DIAGNOSIS — E119 Type 2 diabetes mellitus without complications: Secondary | ICD-10-CM | POA: Diagnosis present

## 2020-04-29 DIAGNOSIS — K56600 Partial intestinal obstruction, unspecified as to cause: Secondary | ICD-10-CM | POA: Diagnosis present

## 2020-04-29 DIAGNOSIS — I10 Essential (primary) hypertension: Secondary | ICD-10-CM | POA: Diagnosis present

## 2020-04-29 DIAGNOSIS — Z79899 Other long term (current) drug therapy: Secondary | ICD-10-CM | POA: Diagnosis not present

## 2020-04-29 DIAGNOSIS — E785 Hyperlipidemia, unspecified: Secondary | ICD-10-CM

## 2020-04-29 DIAGNOSIS — Z90710 Acquired absence of both cervix and uterus: Secondary | ICD-10-CM | POA: Diagnosis not present

## 2020-04-29 DIAGNOSIS — Z20822 Contact with and (suspected) exposure to covid-19: Secondary | ICD-10-CM | POA: Diagnosis present

## 2020-04-29 DIAGNOSIS — E039 Hypothyroidism, unspecified: Secondary | ICD-10-CM | POA: Diagnosis present

## 2020-04-29 DIAGNOSIS — R109 Unspecified abdominal pain: Secondary | ICD-10-CM | POA: Diagnosis present

## 2020-04-29 HISTORY — DX: Unspecified intestinal obstruction, unspecified as to partial versus complete obstruction: K56.609

## 2020-04-29 LAB — COMPREHENSIVE METABOLIC PANEL
ALT: 69 U/L — ABNORMAL HIGH (ref 0–44)
AST: 49 U/L — ABNORMAL HIGH (ref 15–41)
Albumin: 4.1 g/dL (ref 3.5–5.0)
Alkaline Phosphatase: 70 U/L (ref 38–126)
Anion gap: 10 (ref 5–15)
BUN: 14 mg/dL (ref 6–20)
CO2: 24 mmol/L (ref 22–32)
Calcium: 8.9 mg/dL (ref 8.9–10.3)
Chloride: 102 mmol/L (ref 98–111)
Creatinine, Ser: 0.69 mg/dL (ref 0.44–1.00)
GFR, Estimated: >60 mL/min (ref >60)
Glucose, Bld: 127 mg/dL — ABNORMAL HIGH (ref 70–99)
Potassium: 3.4 mmol/L — ABNORMAL LOW (ref 3.5–5.1)
Sodium: 136 mmol/L (ref 135–145)
Total Bilirubin: 0.9 mg/dL (ref 0.3–1.2)
Total Protein: 7.3 g/dL (ref 6.5–8.1)

## 2020-04-29 LAB — HIV ANTIBODY (ROUTINE TESTING W REFLEX): HIV Screen 4th Generation wRfx: NONREACTIVE

## 2020-04-29 LAB — GLUCOSE, CAPILLARY
Glucose-Capillary: 117 mg/dL — ABNORMAL HIGH (ref 70–99)
Glucose-Capillary: 174 mg/dL — ABNORMAL HIGH (ref 70–99)
Glucose-Capillary: 88 mg/dL (ref 70–99)
Glucose-Capillary: 224 mg/dL — ABNORMAL HIGH (ref 70–99)

## 2020-04-29 LAB — RESPIRATORY PANEL BY RT PCR (FLU A&B, COVID)
Influenza A by PCR: NEGATIVE
Influenza B by PCR: NEGATIVE
SARS Coronavirus 2 by RT PCR: NEGATIVE

## 2020-04-29 MED ORDER — FENTANYL CITRATE (PF) 100 MCG/2ML IJ SOLN: 50.0000 ug | INTRAMUSCULAR | Status: DC | PRN

## 2020-04-29 MED ORDER — ENOXAPARIN SODIUM 40 MG/0.4ML ~~LOC~~ SOLN
40.0000 mg | SUBCUTANEOUS | Status: DC
  Filled 2020-04-29: qty 0.4

## 2020-04-29 MED ORDER — FENTANYL CITRATE (PF) 100 MCG/2ML IJ SOLN
50.0000 ug | Freq: Once | INTRAMUSCULAR | Status: AC
  Administered 2020-04-29: 50 ug via INTRAVENOUS

## 2020-04-29 MED ORDER — INSULIN ASPART 100 UNIT/ML ~~LOC~~ SOLN
0.0000 [IU] | SUBCUTANEOUS | Status: DC
  Administered 2020-04-29: 3 [IU] via SUBCUTANEOUS
  Administered 2020-04-29: 2 [IU] via SUBCUTANEOUS
  Administered 2020-04-30: 1 [IU] via SUBCUTANEOUS
  Administered 2020-04-30: 2 [IU] via SUBCUTANEOUS

## 2020-04-29 MED ORDER — LACTATED RINGERS IV SOLN: INTRAVENOUS | Status: DC

## 2020-04-29 MED ORDER — OXYCODONE HCL 5 MG PO TABS
5.0000 mg | ORAL_TABLET | ORAL | Status: DC | PRN
  Administered 2020-04-29: 5 mg via ORAL
  Filled 2020-04-29: qty 1

## 2020-04-29 MED ORDER — IOHEXOL 300 MG/ML  SOLN
100.0000 mL | Freq: Once | INTRAMUSCULAR | Status: AC | PRN
  Administered 2020-04-29: 100 mL via INTRAVENOUS

## 2020-04-29 MED ORDER — ONDANSETRON HCL 4 MG/2ML IJ SOLN: 4.0000 mg | Freq: Four times a day (QID) | INTRAMUSCULAR | Status: DC | PRN

## 2020-04-29 MED ORDER — SODIUM CHLORIDE 0.9 % IV SOLN: INTRAVENOUS | Status: DC

## 2020-04-29 MED ORDER — SODIUM CHLORIDE 0.9 % IV SOLN: Freq: Once | INTRAVENOUS | Status: AC

## 2020-04-29 MED ORDER — ONDANSETRON HCL 4 MG PO TABS: 4.0000 mg | ORAL_TABLET | Freq: Four times a day (QID) | ORAL | Status: DC | PRN

## 2020-04-29 MED FILL — VYVANSE 50 MG CAPSULE: 50 | 30 days supply | Qty: 30 | Fill #0

## 2020-04-29 NOTE — ED Notes (Signed)
Patient transported to CT 

## 2020-04-29 NOTE — Plan of Care (Signed)
Patient seen and rounded on this morning.  Admitted overnight as transfer from [facility]. History of SBO and presented with abdominal pain and nausea typical of her previous obstructions. Findings on CT consistent with early SBO.  She has been evaluated by surgery.  Currently has some return of flatus and improvement in pain and nausea.  Undergoing trial of clear liquids today.  If tolerates likely advance more tomorrow. She understands recommendation to ambulate as often as possible.  Dwyane Dee, MD [facility] 04/29/2020, 12:26 PM

## 2020-04-29 NOTE — Consult Note (Signed)
General Surgery [facility]  Reason for Consult: partial small bowel obstruction  Referring Physician: Dr. Jennette Kettle, [facility]  [facility] Melanie [name] is an 54 y.o. female.  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  Patient has a complex past gynecologic surgical history dating back many years.  She had undergone a myomectomy.  She did have a tubal abscess which required surgical management.  She underwent abdominal hysterectomy.  Patient also had a gynecologic surgical procedure complicated by small bowel injury.  Her last major pelvic surgery was performed in 2010.  Patient has had multiple episodes of lower abdominal discomfort mostly managed at home.  Her last hospitalization for small bowel obstruction was in 2014.  Patient developed severe abdominal pain yesterday.  She had had a bowel movement in the morning.  She presented to [place] for evaluation.  Laboratory studies showed a normal white blood cell count.  CT scan of the abdomen and pelvis showed a early partial small bowel obstruction with edematous loops of small bowel proximal to a suture line in the small intestine in the pelvis.  Patient does have significant diverticular disease without signs of acute diverticulitis.  Patient was admitted to the medical service for management.  Overnight, the patient has noted improvement in her pain.  She is passing flatus.  She denies any nausea or vomiting.  She does not have a nasogastric tube in place.  Past Medical History:  Diagnosis Date  . Bowel obstruction ([name])   . Diabetes mellitus without complication ([place])   . Hypertension     Past Surgical History:  Procedure Laterality Date  . ABDOMINAL HYSTERECTOMY    . MYOMECTOMY      Family History  Problem Relation Age of Onset  . Lung cancer Father   . Diabetes Mother   . Skin cancer Mother      Social History:  reports that she has never smoked. She has never used smokeless tobacco. She reports current alcohol use. She reports that she does not use drugs.  Allergies:  Allergies  Allergen Reactions  . Clarithromycin Other (See Comments) and Rash    Other Reaction: Fever  . Morphine Other (See Comments)    Other Reaction: mental status alerted  . Sulfa Antibiotics Hives    Medications: I have reviewed the patient's current medications.  Results for orders placed or performed during the hospital encounter of 04/28/20 (from the past 48 hour(s))  CBC with Differential/Platelet     Status: None   Collection Time: 04/28/20 11:43 PM  Result Value Ref Range   WBC 8.7 4.0 - 10.5 K/uL   RBC 4.55 3.87 - 5.11 MIL/uL   Hemoglobin 13.8 12.0 - 15.0 g/dL   HCT 39.9 36 - 46 %   MCV 87.7 80.0 - 100.0 fL   MCH 30.3 26.0 - 34.0 pg   MCHC 34.6 30.0 - 36.0 g/dL   RDW 11.7 11.5 - 15.5 %   Platelets 333 150 - 400 K/uL   nRBC 0.0 0.0 - 0.2 %   Neutrophils Relative % 69 %   Neutro Abs 6.0 1.7 - 7.7 K/uL   Lymphocytes Relative 20 %   Lymphs Abs 1.8 0.7 - 4.0 K/uL   Monocytes Relative 8 %   Monocytes Absolute 0.7 0.1 - 1.0 K/uL   Eosinophils Relative 2 %   Eosinophils Absolute 0.2 0.0 - 0.5 K/uL  Basophils Relative 1 %   Basophils Absolute 0.1 0.0 - 0.1 K/uL   Immature Granulocytes 0 %   Abs Immature Granulocytes 0.02 0.00 - 0.07 K/uL    Comment: Performed at [facility], [address] [place], [place] [address]  Comprehensive metabolic panel     Status: Abnormal   Collection Time: 04/28/20 11:43 PM  Result Value Ref Range   Sodium 136 135 - 145 mmol/L   Potassium 3.4 (L) 3.5 - 5.1 mmol/L   Chloride 102 98 - 111 mmol/L   CO2 24 22 - 32 mmol/L   Glucose, Bld 127 (H) 70 - 99 mg/dL    Comment: Glucose reference range applies only to samples taken after fasting for at least 8 hours.   BUN 14 6 - 20 mg/dL   Creatinine, Ser 0.69 0.44 - 1.00 mg/dL   Calcium 8.9 8.9 -  10.3 mg/dL   Total Protein 7.3 6.5 - 8.1 g/dL   Albumin 4.1 3.5 - 5.0 g/dL   AST 49 (H) 15 - 41 U/L   ALT 69 (H) 0 - 44 U/L   Alkaline Phosphatase 70 38 - 126 U/L   Total Bilirubin 0.9 0.3 - 1.2 mg/dL   GFR, Estimated >60 >60 mL/min    Comment: (NOTE) Calculated using the CKD-EPI Creatinine Equation (2021)    Anion gap 10 5 - 15    Comment: Performed at [facility], [address] [place], [place] [address]  Urinalysis, Routine w reflex microscopic Urine, Clean Catch     Status: None   Collection Time: 04/28/20 11:43 PM  Result Value Ref Range   Color, Urine YELLOW YELLOW   APPearance CLEAR CLEAR   Specific Gravity, Urine 1.010 1.005 - 1.030   pH 6.0 5.0 - 8.0   Glucose, UA NEGATIVE NEGATIVE mg/dL   Hgb urine dipstick NEGATIVE NEGATIVE   Bilirubin Urine NEGATIVE NEGATIVE   Ketones, ur NEGATIVE NEGATIVE mg/dL   Protein, ur NEGATIVE NEGATIVE mg/dL   Nitrite NEGATIVE NEGATIVE   Leukocytes,Ua NEGATIVE NEGATIVE    Comment: Microscopic not done on urines with negative protein, blood, leukocytes, nitrite, or glucose < 500 mg/dL. Performed at [facility], [address] [place], [place] [address]   Respiratory Panel by RT PCR (Flu A&B, Covid) - Nasopharyngeal Swab     Status: None   Collection Time: 04/29/20  1:39 AM   Specimen: Nasopharyngeal Swab  Result Value Ref Range   SARS Coronavirus 2 by RT PCR NEGATIVE NEGATIVE    Comment: (NOTE) SARS-CoV-2 target nucleic acids are NOT DETECTED.  The SARS-CoV-2 RNA is generally detectable in upper respiratoy specimens during the acute phase of infection. The lowest concentration of SARS-CoV-2 viral copies this assay can detect is 131 copies/mL. A negative result does not preclude SARS-Cov-2 infection and should not be used as the sole basis for treatment or other patient management decisions. A negative result may occur with  improper specimen collection/handling, submission of specimen other than nasopharyngeal  swab, presence of viral mutation(s) within the areas targeted by this assay, and inadequate number of viral copies (<131 copies/mL). A negative result must be combined with clinical observations, patient history, and epidemiological information. The expected result is Negative.  Fact Sheet for Patients:  PinkCheek.be  Fact Sheet for Healthcare Providers:  GravelBags.it  This test is no t yet approved or cleared by the [place] FDA and  has been authorized for detection and/or diagnosis of SARS-CoV-2 by  FDA under an Emergency Use Authorization (EUA). This EUA will remain  in effect (meaning this test can be used) for the duration of the COVID-19 declaration under Section 564(b)(1) of the Act, 21 U.S.C. section 360bbb-3(b)(1), unless the authorization is terminated or revoked sooner.     Influenza A by PCR NEGATIVE NEGATIVE   Influenza B by PCR NEGATIVE NEGATIVE    Comment: (NOTE) The Xpert Xpress SARS-CoV-2/FLU/RSV assay is intended as an aid in  the diagnosis of influenza from Nasopharyngeal swab specimens and  should not be used as a sole basis for treatment. Nasal washings and  aspirates are unacceptable for Xpert Xpress SARS-CoV-2/FLU/RSV  testing.  Fact Sheet for Patients: PinkCheek.be  Fact Sheet for Healthcare Providers: GravelBags.it  This test is not yet approved or cleared by the [place] FDA and  has been authorized for detection and/or diagnosis of SARS-CoV-2 by  FDA under an Emergency Use Authorization (EUA). This EUA will remain  in effect (meaning this test can be used) for the duration of the  Covid-19 declaration under Section 564(b)(1) of the Act, 21  U.S.C. section 360bbb-3(b)(1), unless the authorization is  terminated or revoked. Performed at [facility], [address] [place], [place] [address]     CT ABDOMEN  PELVIS W CONTRAST  Result Date: 04/29/2020 CLINICAL DATA:  Lower abdominal pain for 1 day. Bowel obstruction suspected. EXAM: CT ABDOMEN AND PELVIS WITH CONTRAST TECHNIQUE: Multidetector CT imaging of the abdomen and pelvis was performed using the standard protocol following bolus administration of intravenous contrast. CONTRAST:  139m OMNIPAQUE IOHEXOL 300 MG/ML  SOLN COMPARISON:  None. FINDINGS: Lower chest: The lung bases are clear. Hepatobiliary: Diffusely decreased hepatic density consistent with steatosis. Mild focal fatty sparing adjacent the gallbladder fossa. Possible small layering gallstone without pericholecystic inflammation. No biliary dilatation. Pancreas: No ductal dilatation or inflammation. Spleen: Normal in size without focal abnormality. Adrenals/Urinary Tract: Normal adrenal glands. No hydronephrosis or perinephric edema. Homogeneous renal enhancement with symmetric excretion on delayed phase imaging. Urinary bladder is partially distended without wall thickening. Stomach/Bowel: Dilated edematous loop of small bowel in the midline pelvis with wall thickening and adjacent mesenteric edema, just proximal to enteric chain sutures where there is a transition point just to the right of midline, series 2, image 70. Dilated small bowel is short segment, however the more proximal small bowel appear fluid-filled. The distal small bowel are decompressed. There is no pneumatosis or perforation. Mesenteric edema from the small bowel inflammation abuts the sigmoid colon in the region of innumerable colonic diverticula, however inflammation related small bowel process is favored rather than diverticulitis. Some areas of sigmoid colonic wall thickening is nonspecific, query underlying mural hypertrophy or reactive. There is rather extensive diffuse colonic diverticulosis, severe in the sigmoid. No abnormal gastric distension. Appendix not visualized, suspected appendectomy with chain sutures noted at the  bases cecum. Vascular/Lymphatic: Mild aortic atherosclerosis. No aortic aneurysm. Patent portal vein. Multiple small retroperitoneal and central mesenteric lymph nodes, likely reactive. Reproductive: Status post hysterectomy. No adnexal masses. Other: Mesenteric edema and small amount of free fluid in the pelvis. No perforation or abscess. Postsurgical changes the anterior abdominal wall. In small fat containing umbilical hernia. Musculoskeletal: Degenerative disc disease at L5-S1. Mild scoliotic curvature of the lumbar spine. There are no acute or suspicious osseous abnormalities. IMPRESSION: 1. Findings consistent with early small bowel obstruction. Dilated edematous loop of small bowel in the midline pelvis with wall thickening and adjacent mesenteric edema, just proximal to enteric  chain sutures where there is a transition point. No perforation or abscess. 2. Extensive diffuse colonic diverticulosis, severe in the sigmoid. Some areas of sigmoid colonic wall thickening is nonspecific, query underlying mural hypertrophy or reactive. 3. Hepatic steatosis. 4. Possible small layering gallstone without pericholecystic inflammation. Aortic Atherosclerosis (ICD10-I70.0). Electronically Signed   By: Keith Rake [name]   On: 04/29/2020 01:29    Review of Systems  Constitutional: Positive for appetite change.  HENT: Negative.   Eyes: Negative.   Respiratory: Negative.   Cardiovascular: Negative.   Gastrointestinal: Positive for abdominal pain. Negative for nausea and vomiting.  Endocrine: Negative.   Genitourinary: Negative.   Musculoskeletal: Negative.   Skin: Negative.   Allergic/Immunologic: Negative.   Neurological: Negative.   Hematological: Negative.   Psychiatric/Behavioral: Negative.     Physical Exam  Blood pressure 134/81, pulse 68, temperature 98.1 F (36.7 C), temperature source Oral, resp. rate 18, height 5' 7.5" (1.715 m), weight 99.8 kg, SpO2 100 %.  CONSTITUTIONAL: no acute  distress; conversant; no obvious deformities  EYES: conjunctiva moist; no lid lag; anicteric; pupils equal bilaterally  NECK: trachea midline; no thyroid nodularity  LUNGS: respiratory effort normal & unlabored; no wheeze; no rales; no tactile fremitus  CV: rate and rhythm regular; no palpable thrills; no murmur; no edema bilat lower extremities  GI: abdomen is soft without distention; normal bowel sounds are present; no significant tenderness; no palpable mass; well-healed lower abdominal incisions; no hepatosplenomegaly; no obvious hernia  MSK: normal range of motion of extremities; no clubbing; no cyanosis  PSYCH: appropriate affect for situation; alert and oriented to person, place, & time  LYMPHATIC: no palpable cervical lymphadenopathy; no evidence lymphedema in extremities   Assessment/Plan: Partial small bowel obstruction, recurrent  Hold off on NG tube or small bowel protocol at present - symptoms improving overnight  Begin clear liquid diet - ordered  OOB, ambulation  Will follow  The patient and I had a long discussion regarding her surgical history and the findings on CT scan.  We discussed her intermittent symptoms of partial small bowel obstruction.  We discussed beginning a regimen of daily MiraLAX and avoiding bulky foods such as nuts, peanuts, and heavy vegetative matter.  We discussed scheduling a follow-up colonoscopy at some point in the future.  Hopefully this episode will resolve without further intervention.  Surgery will follow peripherally.  Melanie Gemma, MD [facility] Office: [place] 04/29/2020, 8:36 AM

## 2020-04-29 NOTE — H&P (Signed)
History and Physical    Dineen Conradt JKD:[id number] DOB: Jan 17, 1966 DOA: 04/28/2020  PCP: Samuel Bouche, NP  Patient coming from: Home  I have personally briefly reviewed patient's old medical records in [place]  Chief Complaint: Abd pain  HPI: Melanie [name] is a 53 y.o. female with medical history significant of previous SBOs due to adhesions, HTN, DM2.  Pt presents to ED at [facility] with c/o 1 day h/o abd pain.  Symptoms onset earlier in the day yesterday, worsened throughout the day.  Had large BM in AM but subsequently had decreased BMs throughout day yesterday.  Pain is waxing and waning.  8/10 at worst.  Located in lower abdomen, cramping in nature.  No fevers, chills, cough, SOB, vomiting.  Has had nausea and abd distention.   ED Course: CT confirms early SBO.  Hospitalist asked to admit and gen surg consulted.  NGT not placed.   Review of Systems: As per HPI, otherwise all review of systems negative.  Past Medical History:  Diagnosis Date  . Bowel obstruction ([place])   . Diabetes mellitus without complication ([place])   . Hypertension     Past Surgical History:  Procedure Laterality Date  . ABDOMINAL HYSTERECTOMY    . MYOMECTOMY       reports that she has never smoked. She has never used smokeless tobacco. She reports current alcohol use. She reports that she does not use drugs.  Allergies  Allergen Reactions  . Clarithromycin Other (See Comments) and Rash    Other Reaction: Fever  . Morphine Other (See Comments)    Other Reaction: mental status alerted  . Sulfa Antibiotics Hives    Family History  Problem Relation Age of Onset  . Lung cancer Father   . Diabetes Mother   . Skin cancer Mother      Prior to Admission medications   Medication Sig Start Date End Date Taking? Authorizing Provider  aspirin EC 81 MG tablet Take 81 mg by mouth daily.   Yes [provider]  buPROPion (WELLBUTRIN XL) 150 MG 24 hr tablet Take 2  tablets (300 mg total) by mouth daily. Patient taking differently: Take 150 mg by mouth daily.  12/05/19  Yes Samuel Bouche, NP  celecoxib (CELEBREX) 200 MG capsule TAKE 1 TO 2 CAPSULES BY MOUTH DAILY AS NEEDED FOR PAIN Patient taking differently: Take 200 mg by mouth 2 (two) times daily.  02/25/20  Yes Jessup, Joy, NP  fluticasone (FLONASE) 50 MCG/ACT nasal spray PLACE 1 SPRAY IN EACH NOSTRIL TWO TIMES DAILY. **USE LEFT HAND FOR RIGHT NOSTRIL AND RIGHT HAND FOR LEFT NOSTRIL** Patient taking differently: Place 1 spray into both nostrils in the morning and at bedtime. **USE LEFT HAND FOR RIGHT NOSTRIL AND RIGHT HAND FOR LEFT NOSTRIL** 04/24/20  Yes Jessup, Joy, NP  ipratropium (ATROVENT) 0.03 % nasal spray INSTILL 2 SPRAYS INTO EACH NOSTRIL EVERY 12 HOURS. Patient taking differently: Place 2 sprays into both nostrils every 12 (twelve) hours.  12/05/19  Yes Samuel Bouche, NP  levocetirizine (XYZAL) 5 MG tablet Take 1 tablet (5 mg total) by mouth every evening. 10/09/19  Yes Emeterio Reeve, DO  lisdexamfetamine (VYVANSE) 50 MG capsule Take 1 capsule (50 mg total) by mouth daily. 04/24/20  Yes Samuel Bouche, NP  MULTIPLE VITAMIN PO Take 1 tablet by mouth daily.   Yes [provider]  Olopatadine HCl 0.2 % SOLN INSTILL 1 DROP INTO EACH EYE ONCE DAILY. Patient taking differently: Place 1 drop into both  eyes daily as needed (allergies).  02/07/20  Yes Samuel Bouche, NP  pregabalin (LYRICA) 25 MG capsule 1 capsule p.o. nightly for a week then twice a day for a week then three times a day. 04/14/20  Yes Silverio Decamp, MD  blood glucose meter kit and supplies KIT Dispense based on patient and insurance preference. Use up to four times daily as directed. Please include lancets, test strips, control solution. 04/24/20   Samuel Bouche, NP  losartan (COZAAR) 25 MG tablet Take 1 tablet (25 mg total) by mouth daily. 04/24/20   Samuel Bouche, NP  pravastatin (PRAVACHOL) 20 MG tablet Take 1 tablet (20 mg total) by  mouth daily. 04/24/20   Samuel Bouche, NP  Semaglutide-Weight Management 0.25 MG/0.5ML SOAJ Inject 0.25 mg into the skin once a week for 28 days. 04/24/20 05/22/20  Samuel Bouche, NP  Semaglutide-Weight Management 0.5 MG/0.5ML SOAJ Inject 0.5 mg into the skin once a week for 28 days. 05/23/20 06/20/20  Samuel Bouche, NP  Semaglutide-Weight Management 1 MG/0.5ML SOAJ Inject 1 mg into the skin once a week for 28 days. 06/21/20 07/19/20  Samuel Bouche, NP  Semaglutide-Weight Management 1.7 MG/0.75ML SOAJ Inject 1.7 mg into the skin once a week for 28 days. 07/20/20 08/17/20  Samuel Bouche, NP  Semaglutide-Weight Management 2.4 MG/0.75ML SOAJ Inject 2.4 mg into the skin once a week for 28 days. 08/18/20 09/15/20  Samuel Bouche, NP  traZODone (DESYREL) 50 MG tablet Take 0.5-1 tablets (25-50 mg total) by mouth at bedtime as needed for sleep. Patient not taking: Reported on 04/29/2020 02/25/20   Samuel Bouche, NP    Physical Exam: Vitals:   04/29/20 0000 04/29/20 0200 04/29/20 0406 04/29/20 0452  BP: (!) 146/93 107/63 92/68 134/81  Pulse: 76 73 75 68  Resp: _0 Temp:   97.9 F (36.6 C) 98.1 F (36.7 C)  TempSrc:   Oral Oral  SpO2: 98% 98% 97% 100%  Weight:      Height:        Constitutional: NAD, calm, comfortable Eyes: PERRL, lids and conjunctivae normal ENMT: Mucous membranes are moist. Posterior pharynx clear of any exudate or lesions.Normal dentition.  Neck: normal, supple, no masses, no thyromegaly Respiratory: clear to auscultation bilaterally, no wheezing, no crackles. Normal respiratory effort. No accessory muscle use.  Cardiovascular: Regular rate and rhythm, no murmurs / rubs / gallops. No extremity edema. 2+ pedal pulses. No carotid bruits.  Abdomen: Mild diffuse TTP. Musculoskeletal: no clubbing / cyanosis. No joint deformity upper and lower extremities. Good ROM, no contractures. Normal muscle tone.  Skin: no rashes, lesions, ulcers. No induration Neurologic: CN 2-12 grossly intact.  Sensation intact, DTR normal. Strength 5/5 in all 4.  Psychiatric: Normal judgment and insight. Alert and oriented x 3. Normal mood.    Labs on Admission: I have personally reviewed following labs and imaging studies  CBC: Recent Labs  Lab 04/28/20 2343  WBC 8.7  NEUTROABS 6.0  HGB 13.8  HCT 39.9  MCV 87.7  PLT 010   Basic Metabolic Panel: Recent Labs  Lab 04/28/20 2343  NA 136  K 3.4*  CL 102  CO2 24  GLUCOSE 127*  BUN 14  CREATININE 0.69  CALCIUM 8.9   GFR: Estimated Creatinine Clearance: 100.8 mL/min (by C-G formula based on SCr of 0.69 mg/dL). Liver Function Tests: Recent Labs  Lab 04/28/20 2343  AST 49*  ALT 69*  ALKPHOS 70  BILITOT 0.9  PROT 7.3  ALBUMIN 4.1  No results for input(s): LIPASE, AMYLASE in the last 168 hours. No results for input(s): AMMONIA in the last 168 hours. Coagulation Profile: No results for input(s): INR, PROTIME in the last 168 hours. Cardiac Enzymes: No results for input(s): CKTOTAL, CKMB, CKMBINDEX, TROPONINI in the last 168 hours. BNP (last 3 results) No results for input(s): PROBNP in the last 8760 hours. HbA1C: No results for input(s): HGBA1C in the last 72 hours. CBG: No results for input(s): GLUCAP in the last 168 hours. Lipid Profile: No results for input(s): CHOL, HDL, LDLCALC, TRIG, CHOLHDL, LDLDIRECT in the last 72 hours. Thyroid Function Tests: No results for input(s): TSH, T4TOTAL, FREET4, T3FREE, THYROIDAB in the last 72 hours. Anemia Panel: No results for input(s): VITAMINB12, FOLATE, FERRITIN, TIBC, IRON, RETICCTPCT in the last 72 hours. Urine analysis:    Component Value Date/Time   COLORURINE YELLOW 04/28/2020 2343   APPEARANCEUR CLEAR 04/28/2020 2343   LABSPEC 1.010 04/28/2020 2343   PHURINE 6.0 04/28/2020 2343   GLUCOSEU NEGATIVE 04/28/2020 2343   HGBUR NEGATIVE 04/28/2020 2343   BILIRUBINUR NEGATIVE 04/28/2020 2343   BILIRUBINUR negative 06/24/2019 [place] 04/28/2020 2343    PROTEINUR NEGATIVE 04/28/2020 2343   UROBILINOGEN 1.0 06/24/2019 1446   NITRITE NEGATIVE 04/28/2020 2343   LEUKOCYTESUR NEGATIVE 04/28/2020 2343    Radiological Exams on Admission: CT ABDOMEN PELVIS W CONTRAST  Result Date: 04/29/2020 CLINICAL DATA:  Lower abdominal pain for 1 day. Bowel obstruction suspected. EXAM: CT ABDOMEN AND PELVIS WITH CONTRAST TECHNIQUE: Multidetector CT imaging of the abdomen and pelvis was performed using the standard protocol following bolus administration of intravenous contrast. CONTRAST:  163m OMNIPAQUE IOHEXOL 300 MG/ML  SOLN COMPARISON:  None. FINDINGS: Lower chest: The lung bases are clear. Hepatobiliary: Diffusely decreased hepatic density consistent with steatosis. Mild focal fatty sparing adjacent the gallbladder fossa. Possible small layering gallstone without pericholecystic inflammation. No biliary dilatation. Pancreas: No ductal dilatation or inflammation. Spleen: Normal in size without focal abnormality. Adrenals/Urinary Tract: Normal adrenal glands. No hydronephrosis or perinephric edema. Homogeneous renal enhancement with symmetric excretion on delayed phase imaging. Urinary bladder is partially distended without wall thickening. Stomach/Bowel: Dilated edematous loop of small bowel in the midline pelvis with wall thickening and adjacent mesenteric edema, just proximal to enteric chain sutures where there is a transition point just to the right of midline, series 2, image 70. Dilated small bowel is short segment, however the more proximal small bowel appear fluid-filled. The distal small bowel are decompressed. There is no pneumatosis or perforation. Mesenteric edema from the small bowel inflammation abuts the sigmoid colon in the region of innumerable colonic diverticula, however inflammation related small bowel process is favored rather than diverticulitis. Some areas of sigmoid colonic wall thickening is nonspecific, query underlying mural hypertrophy or  reactive. There is rather extensive diffuse colonic diverticulosis, severe in the sigmoid. No abnormal gastric distension. Appendix not visualized, suspected appendectomy with chain sutures noted at the bases cecum. Vascular/Lymphatic: Mild aortic atherosclerosis. No aortic aneurysm. Patent portal vein. Multiple small retroperitoneal and central mesenteric lymph nodes, likely reactive. Reproductive: Status post hysterectomy. No adnexal masses. Other: Mesenteric edema and small amount of free fluid in the pelvis. No perforation or abscess. Postsurgical changes the anterior abdominal wall. In small fat containing umbilical hernia. Musculoskeletal: Degenerative disc disease at L5-S1. Mild scoliotic curvature of the lumbar spine. There are no acute or suspicious osseous abnormalities. IMPRESSION: 1. Findings consistent with early small bowel obstruction. Dilated edematous loop of small bowel in the midline pelvis  with wall thickening and adjacent mesenteric edema, just proximal to enteric chain sutures where there is a transition point. No perforation or abscess. 2. Extensive diffuse colonic diverticulosis, severe in the sigmoid. Some areas of sigmoid colonic wall thickening is nonspecific, query underlying mural hypertrophy or reactive. 3. Hepatic steatosis. 4. Possible small layering gallstone without pericholecystic inflammation. Aortic Atherosclerosis (ICD10-I70.0). Electronically Signed   By: Keith Rake [name]   On: 04/29/2020 01:29    EKG: Independently reviewed.  Assessment/Plan Principal Problem:   SBO (small bowel obstruction) (HCC) Active Problems:   Essential hypertension   Type 2 diabetes mellitus without complication, without long-term current use of insulin ([place])    1. SBO - 1. NPO 2. IVF: LR at 125 3. No NGT at this time 4. PRN IV fentanyl for pain 5. Gen surg to see in AM 2. DM2 - 1. Sensitive SSI Q4H 3. HTN - 1. Holding home meds due to NPO status for the moment  DVT  prophylaxis: Lovenox Code Status: Full Family Communication: No family in room Disposition Plan: Home after SBO has resolved and patient able to take POs again Consults called: Dr. Barry Dienes Admission status: Admit to inpatient  Severity of Illness: The appropriate patient status for this patient is INPATIENT. Inpatient status is judged to be reasonable and necessary in order to provide the required intensity of service to ensure the patient's safety. The patient's presenting symptoms, physical exam findings, and initial radiographic and laboratory data in the context of their chronic comorbidities is felt to place them at high risk for further clinical deterioration. Furthermore, it is not anticipated that the patient will be medically stable for discharge from the hospital within 2 midnights of admission. The following factors support the patient status of inpatient.   IP status due to SBO.   * I certify that at the point of admission it is my clinical judgment that the patient will require inpatient hospital care spanning beyond 2 midnights from the point of admission due to high intensity of service, high risk for further deterioration and high frequency of surveillance required.*    Stefannie Defeo M. DO [facility]  How to contact the [facility] Attending or Consulting provider [place] or covering provider during after hours [place], for this patient?  1. Check the care team in [facility] and look for a) attending/consulting TR[facility] provider listed and b) the [facility] team listed 2. Log into [email or web address]  Amion Physician Scheduling and messaging for groups and whole hospitals  On call and physician scheduling software for group practices, residents, hospitalists and other medical providers for call, clinic, rotation and shift schedules. OnCall [employer] is a hospital-wide system for scheduling doctors and paging doctors on call. EasyPlot is for scientific plotting and data analysis.  [email or web address]  and  use [place]'s universal password to access. If you do not have the password, please contact the hospital operator.  3. Locate the [facility] provider you are looking for under [facility] and page to a number that you can be directly reached. 4. If you still have difficulty reaching the provider, please page the [facility] (Director on Call) for the Hospitalists listed on amion for assistance.  04/29/2020, 5:45 AM

## 2020-04-29 NOTE — ED Notes (Signed)
Report given to [facility]. 

## 2020-04-29 NOTE — Progress Notes (Signed)
Chaplain engaged in initial visit with Benjamine Mola.  During visit, Natalyah shared that she wants to complete an Scientist, physiological.  She has been doing some thinking around who should be her healthcare agent.  Chaplain celebrated and affirmed Mliss's process of choosing the right healthcare agent.  She currently has two people in mind and plans on having a conversation with them.  Charon does not believe she will be able to complete the AD while here at the hospital.  Chaplain discussed with her the options she has of completing the AD outside of the hospital and ways to get paperwork notarized.  Autry also wanted to spend time thinking of ways to help her mom retain her memory.  Chaplain and Emmaline discussed utilizing puzzles, art, and reading as ways to engage with her mother.    Chaplain will continue to follow-up and provideElizabeth some resources for memory.      04/29/20 0900  Clinical Encounter Type  Visited With Patient  Visit Type Initial  Referral From Patient  Consult/Referral To Chaplain  Spiritual Encounters  Spiritual Needs  (Advanced Directive)

## 2020-04-30 ENCOUNTER — Encounter: Payer: No Typology Code available for payment source | Admitting: Rehabilitative and Restorative Service Providers"

## 2020-04-30 ENCOUNTER — Inpatient Hospital Stay (HOSPITAL_COMMUNITY): Payer: No Typology Code available for payment source

## 2020-04-30 LAB — CBC WITH DIFFERENTIAL/PLATELET
Abs Immature Granulocytes: 0.01 10*3/uL (ref 0.00–0.07)
Basophils Absolute: 0 10*3/uL (ref 0.0–0.1)
Basophils Relative: 1 %
Eosinophils Absolute: 0.5 10*3/uL (ref 0.0–0.5)
Eosinophils Relative: 8 %
HCT: 37.1 % (ref 36.0–46.0)
Hemoglobin: 12.6 g/dL (ref 12.0–15.0)
Immature Granulocytes: 0 %
Lymphocytes Relative: 39 %
Lymphs Abs: 2.1 10*3/uL (ref 0.7–4.0)
MCH: 30.6 pg (ref 26.0–34.0)
MCHC: 34 g/dL (ref 30.0–36.0)
MCV: 90 fL (ref 80.0–100.0)
Monocytes Absolute: 0.7 10*3/uL (ref 0.1–1.0)
Monocytes Relative: 12 %
Neutro Abs: 2.2 10*3/uL (ref 1.7–7.7)
Neutrophils Relative %: 40 %
Platelets: 274 10*3/uL (ref 150–400)
RBC: 4.12 MIL/uL (ref 3.87–5.11)
RDW: 11.7 % (ref 11.5–15.5)
WBC: 5.5 10*3/uL (ref 4.0–10.5)
nRBC: 0 % (ref 0.0–0.2)

## 2020-04-30 LAB — BASIC METABOLIC PANEL
Anion gap: 8 (ref 5–15)
BUN: 8 mg/dL (ref 6–20)
CO2: 25 mmol/L (ref 22–32)
Calcium: 9.1 mg/dL (ref 8.9–10.3)
Chloride: 108 mmol/L (ref 98–111)
Creatinine, Ser: 0.57 mg/dL (ref 0.44–1.00)
GFR, Estimated: >60 mL/min (ref >60)
Glucose, Bld: 118 mg/dL — ABNORMAL HIGH (ref 70–99)
Potassium: 3.8 mmol/L (ref 3.5–5.1)
Sodium: 141 mmol/L (ref 135–145)

## 2020-04-30 LAB — MAGNESIUM: Magnesium: 2.1 mg/dL (ref 1.7–2.4)

## 2020-04-30 LAB — GLUCOSE, CAPILLARY
Glucose-Capillary: 102 mg/dL — ABNORMAL HIGH (ref 70–99)
Glucose-Capillary: 108 mg/dL — ABNORMAL HIGH (ref 70–99)
Glucose-Capillary: 134 mg/dL — ABNORMAL HIGH (ref 70–99)
Glucose-Capillary: 161 mg/dL — ABNORMAL HIGH (ref 70–99)

## 2020-04-30 NOTE — Discharge Summary (Signed)
Physician Discharge Summary   Melanie [name] YQM:[id number] DOB: 09-13-1965 DOA: 04/28/2020  PCP: Samuel Bouche, NP  Admit date: 04/28/2020 Discharge date: 04/30/2020  Admitted From: home Disposition:  home Admitting physician: Dr. Alcario Drought Discharging physician: Dwyane Dee, MD  Recommendations for Outpatient Follow-up:  1. Follow up with GI for scheduling colonoscopy   Patient discharged to home in Discharge Condition: stable CODE STATUS: Full Diet recommendation:  Diet Orders (From admission, onward)    Start     Ordered   04/30/20 0856  Diet regular Room service appropriate? Yes; Fluid consistency: Thin  Diet effective now       Question Answer Comment  Room service appropriate? Yes   Fluid consistency: Thin      04/30/20 0855   04/30/20 0000  Diet general        04/30/20 1412          Hospital Course: Ms. Butters is a 54 yo CF with PMH recurrent SBO, HTN, DMII who presented originally to [facility] with abdominal pain and nausea.  CT A/P showed early SBO. She was kept NPO and transferred to [facility]. She did not require placement of NGT. General surgery was consulted and she underwent serial abdominal xrays. She was started on CLD which was slowly advanced as she had return of flatus and improvement of her pain.  She had an unremarkable abd xray and tolerated regular diet prior to discharge; she was considered stable for discharging home. Surgery has recommended outpatient CLN after resolution of her current episode.    No new Assessment & Plan notes have been filed under this hospital service since the last note was generated. Service: Hospitalist   The patient's chronic medical conditions were treated accordingly per the patient's home medication regimen except as noted.  On day of discharge, patient was felt deemed stable for discharge. Patient/family member advised to call PCP or come back to ER if needed.   Principal Diagnosis: SBO (small bowel obstruction)  ([place])  Discharge Diagnoses: Active Hospital Problems   Diagnosis Date Noted  . SBO (small bowel obstruction) ([place]) 04/29/2020    Priority: High  . HLD (hyperlipidemia) 04/29/2020  . Type 2 diabetes mellitus without complication, without long-term current use of insulin ([place]) 04/24/2020  . Hypothyroidism 02/27/2018  . Essential hypertension 09/12/2015    Resolved Hospital Problems  No resolved problems to display.    Discharge Instructions    Diet general   Complete by: As directed    Increase activity slowly   Complete by: As directed      Allergies as of 04/30/2020      Reactions   Clarithromycin Other (See Comments), Rash   Other Reaction: Fever   Morphine Other (See Comments)   Other Reaction: mental status alerted   Sulfa Antibiotics Hives      Medication List    STOP taking these medications   traZODone 50 MG tablet Commonly known as: DESYREL     TAKE these medications   aspirin EC 81 MG tablet Take 81 mg by mouth daily.   blood glucose meter kit and supplies Kit Dispense based on patient and insurance preference. Use up to four times daily as directed. Please include lancets, test strips, control solution.   buPROPion 150 MG 24 hr tablet Commonly known as: WELLBUTRIN XL Take 2 tablets (300 mg total) by mouth daily. What changed: how much to take   celecoxib 200 MG capsule Commonly known as: CELEBREX TAKE 1 TO 2 CAPSULES BY MOUTH DAILY  AS NEEDED FOR PAIN What changed:   how much to take  how to take this  when to take this  additional instructions   fluticasone 50 MCG/ACT nasal spray Commonly known as: FLONASE PLACE 1 SPRAY IN EACH NOSTRIL TWO TIMES DAILY. **USE LEFT HAND FOR RIGHT NOSTRIL AND RIGHT HAND FOR LEFT NOSTRIL** What changed:   how much to take  how to take this  when to take this  additional instructions   ipratropium 0.03 % nasal spray Commonly known as: ATROVENT INSTILL 2 SPRAYS INTO EACH NOSTRIL EVERY 12 HOURS. What  changed: See the new instructions.   levocetirizine 5 MG tablet Commonly known as: Xyzal Take 1 tablet (5 mg total) by mouth every evening.   lisdexamfetamine 50 MG capsule Commonly known as: VYVANSE Take 1 capsule (50 mg total) by mouth daily.   losartan 25 MG tablet Commonly known as: COZAAR Take 1 tablet (25 mg total) by mouth daily.   MULTIPLE VITAMIN PO Take 1 tablet by mouth daily.   Olopatadine HCl 0.2 % Soln INSTILL 1 DROP INTO EACH EYE ONCE DAILY. What changed: See the new instructions.   pravastatin 20 MG tablet Commonly known as: PRAVACHOL Take 1 tablet (20 mg total) by mouth daily.   pregabalin 25 MG capsule Commonly known as: Lyrica 1 capsule p.o. nightly for a week then twice a day for a week then three times a day.   Semaglutide-Weight Management 0.25 MG/0.5ML Soaj Inject 0.25 mg into the skin once a week for 28 days.   Semaglutide-Weight Management 0.5 MG/0.5ML Soaj Inject 0.5 mg into the skin once a week for 28 days. Start taking on: May 23, 2020   Semaglutide-Weight Management 1 MG/0.5ML Soaj Inject 1 mg into the skin once a week for 28 days. Start taking on: June 21, 2020   Semaglutide-Weight Management 1.7 MG/0.75ML Soaj Inject 1.7 mg into the skin once a week for 28 days. Start taking on: July 20, 2020   Semaglutide-Weight Management 2.4 MG/0.75ML Soaj Inject 2.4 mg into the skin once a week for 28 days. Start taking on: August 18, 2020       Follow-up Information    Samuel Bouche, NP. Schedule an appointment as soon as possible for a visit in 2 week(s).   Specialty: Nurse Practitioner Why: Please discuss timing of colonoscopy per surgery recommendations Contact information: [place] [address] [phone]              Allergies  Allergen Reactions  . Clarithromycin Other (See Comments) and Rash    Other Reaction: Fever  . Morphine Other (See Comments)    Other Reaction: mental  status alerted  . Sulfa Antibiotics Hives    Consultations: Surgery  Discharge Exam: BP 108/64 (BP Location: Left Arm)   Pulse 66   Temp (!) 97.3 F (36.3 C) (Oral)   Resp 18   Ht 5' 7.5" (1.715 m)   Wt 99.8 kg   SpO2 96%   BMI 33.95 kg/m  General appearance: alert, cooperative and no distress Head: Normocephalic, without obvious abnormality, atraumatic Eyes: EOMI Lungs: clear to auscultation bilaterally Heart: regular rate and rhythm and S1, S2 normal Abdomen: normal findings: bowel sounds normal and soft, non-tender Extremities: no edema Skin: mobility and turgor normal Neurologic: Grossly normal  The results of significant diagnostics from this hospitalization (including imaging, microbiology, ancillary and laboratory) are listed below for reference.   Microbiology: Recent Results (from the past 240 hour(s))  Respiratory Panel by RT PCR (Flu A&B, Covid) - Nasopharyngeal Swab     Status: None   Collection Time: 04/29/20  1:39 AM   Specimen: Nasopharyngeal Swab  Result Value Ref Range Status   SARS Coronavirus 2 by RT PCR NEGATIVE NEGATIVE Final    Comment: (NOTE) SARS-CoV-2 target nucleic acids are NOT DETECTED.  The SARS-CoV-2 RNA is generally detectable in upper respiratoy specimens during the acute phase of infection. The lowest concentration of SARS-CoV-2 viral copies this assay can detect is 131 copies/mL. A negative result does not preclude SARS-Cov-2 infection and should not be used as the sole basis for treatment or other patient management decisions. A negative result may occur with  improper specimen collection/handling, submission of specimen other than nasopharyngeal swab, presence of viral mutation(s) within the areas targeted by this assay, and inadequate number of viral copies (<131 copies/mL). A negative result must be combined with clinical observations, patient history, and epidemiological information. The expected result is Negative.  Fact  Sheet for Patients:  PinkCheek.be  Fact Sheet for Healthcare Providers:  GravelBags.it  This test is no t yet approved or cleared by the [place] FDA and  has been authorized for detection and/or diagnosis of SARS-CoV-2 by FDA under an Emergency Use Authorization (EUA). This EUA will remain  in effect (meaning this test can be used) for the duration of the COVID-19 declaration under Section 564(b)(1) of the Act, 21 U.S.C. section 360bbb-3(b)(1), unless the authorization is terminated or revoked sooner.     Influenza A by PCR NEGATIVE NEGATIVE Final   Influenza B by PCR NEGATIVE NEGATIVE Final    Comment: (NOTE) The Xpert Xpress SARS-CoV-2/FLU/RSV assay is intended as an aid in  the diagnosis of influenza from Nasopharyngeal swab specimens and  should not be used as a sole basis for treatment. Nasal washings and  aspirates are unacceptable for Xpert Xpress SARS-CoV-2/FLU/RSV  testing.  Fact Sheet for Patients: PinkCheek.be  Fact Sheet for Healthcare Providers: GravelBags.it  This test is not yet approved or cleared by the [place] FDA and  has been authorized for detection and/or diagnosis of SARS-CoV-2 by  FDA under an Emergency Use Authorization (EUA). This EUA will remain  in effect (meaning this test can be used) for the duration of the  Covid-19 declaration under Section 564(b)(1) of the Act, 21  U.S.C. section 360bbb-3(b)(1), unless the authorization is  terminated or revoked. Performed at [facility], [address] [place], [place] [address]      Labs: BNP (last 3 results) No results for input(s): BNP in the last 8760 hours. Basic Metabolic Panel: Recent Labs  Lab 04/28/20 2343 04/30/20 0346  NA 136 141  K 3.4* 3.8  CL 102 108  CO2 24 25  GLUCOSE 127* 118*  BUN 14 8  CREATININE 0.69 0.57  CALCIUM 8.9 9.1  MG  --  2.1    Liver Function Tests: Recent Labs  Lab 04/28/20 2343  AST 49*  ALT 69*  ALKPHOS 70  BILITOT 0.9  PROT 7.3  ALBUMIN 4.1   No results for input(s): LIPASE, AMYLASE in the last 168 hours. No results for input(s): AMMONIA in the last 168 hours. CBC: Recent Labs  Lab 04/28/20 2343 04/30/20 0346  WBC 8.7 5.5  NEUTROABS 6.0 2.2  HGB 13.8 12.6  HCT 39.9 37.1  MCV 87.7 90.0  PLT 333 274   Cardiac Enzymes: No results for input(s): CKTOTAL, CKMB, CKMBINDEX, TROPONINI in the last 168 hours.  BNP: Invalid input(s): POCBNP CBG: Recent Labs  Lab 04/29/20 1922 04/30/20 0016 04/30/20 0346 04/30/20 0757 04/30/20 1210  GLUCAP 224* 102* 108* 134* 161*   D-Dimer No results for input(s): DDIMER in the last 72 hours. Hgb A1c No results for input(s): HGBA1C in the last 72 hours. Lipid Profile No results for input(s): CHOL, HDL, LDLCALC, TRIG, CHOLHDL, LDLDIRECT in the last 72 hours. Thyroid function studies No results for input(s): TSH, T4TOTAL, T3FREE, THYROIDAB in the last 72 hours.  Invalid input(s): FREET3 Anemia work up No results for input(s): VITAMINB12, FOLATE, FERRITIN, TIBC, IRON, RETICCTPCT in the last 72 hours. Urinalysis    Component Value Date/Time   COLORURINE YELLOW 04/28/2020 2343   APPEARANCEUR CLEAR 04/28/2020 2343   LABSPEC 1.010 04/28/2020 2343   PHURINE 6.0 04/28/2020 2343   GLUCOSEU NEGATIVE 04/28/2020 2343   HGBUR NEGATIVE 04/28/2020 2343   BILIRUBINUR NEGATIVE 04/28/2020 2343   BILIRUBINUR negative 06/24/2019 1446   KETONESUR NEGATIVE 04/28/2020 2343   PROTEINUR NEGATIVE 04/28/2020 2343   UROBILINOGEN 1.0 06/24/2019 1446   NITRITE NEGATIVE 04/28/2020 2343   LEUKOCYTESUR NEGATIVE 04/28/2020 2343   Sepsis Labs Invalid input(s): PROCALCITONIN,  WBC,  LACTICIDVEN Microbiology Recent Results (from the past 240 hour(s))  Respiratory Panel by RT PCR (Flu A&B, Covid) - Nasopharyngeal Swab     Status: None   Collection Time: 04/29/20  1:39 AM    Specimen: Nasopharyngeal Swab  Result Value Ref Range Status   SARS Coronavirus 2 by RT PCR NEGATIVE NEGATIVE Final    Comment: (NOTE) SARS-CoV-2 target nucleic acids are NOT DETECTED.  The SARS-CoV-2 RNA is generally detectable in upper respiratoy specimens during the acute phase of infection. The lowest concentration of SARS-CoV-2 viral copies this assay can detect is 131 copies/mL. A negative result does not preclude SARS-Cov-2 infection and should not be used as the sole basis for treatment or other patient management decisions. A negative result may occur with  improper specimen collection/handling, submission of specimen other than nasopharyngeal swab, presence of viral mutation(s) within the areas targeted by this assay, and inadequate number of viral copies (<131 copies/mL). A negative result must be combined with clinical observations, patient history, and epidemiological information. The expected result is Negative.  Fact Sheet for Patients:  PinkCheek.be  Fact Sheet for Healthcare Providers:  GravelBags.it  This test is no t yet approved or cleared by the [place] FDA and  has been authorized for detection and/or diagnosis of SARS-CoV-2 by FDA under an Emergency Use Authorization (EUA). This EUA will remain  in effect (meaning this test can be used) for the duration of the COVID-19 declaration under Section 564(b)(1) of the Act, 21 U.S.C. section 360bbb-3(b)(1), unless the authorization is terminated or revoked sooner.     Influenza A by PCR NEGATIVE NEGATIVE Final   Influenza B by PCR NEGATIVE NEGATIVE Final    Comment: (NOTE) The Xpert Xpress SARS-CoV-2/FLU/RSV assay is intended as an aid in  the diagnosis of influenza from Nasopharyngeal swab specimens and  should not be used as a sole basis for treatment. Nasal washings and  aspirates are unacceptable for Xpert Xpress SARS-CoV-2/FLU/RSV   testing.  Fact Sheet for Patients: PinkCheek.be  Fact Sheet for Healthcare Providers: GravelBags.it  This test is not yet approved or cleared by the [place] FDA and  has been authorized for detection and/or diagnosis of SARS-CoV-2 by  FDA under an Emergency Use Authorization (EUA). This EUA will remain  in effect (meaning this test can be used) for  the duration of the  Covid-19 declaration under Section 564(b)(1) of the Act, 21  U.S.C. section 360bbb-3(b)(1), unless the authorization is  terminated or revoked. Performed at [facility], [address] [place], [place] [address]     Procedures/Studies: CT ABDOMEN PELVIS W CONTRAST  Result Date: 04/29/2020 CLINICAL DATA:  Lower abdominal pain for 1 day. Bowel obstruction suspected. EXAM: CT ABDOMEN AND PELVIS WITH CONTRAST TECHNIQUE: Multidetector CT imaging of the abdomen and pelvis was performed using the standard protocol following bolus administration of intravenous contrast. CONTRAST:  164m OMNIPAQUE IOHEXOL 300 MG/ML  SOLN COMPARISON:  None. FINDINGS: Lower chest: The lung bases are clear. Hepatobiliary: Diffusely decreased hepatic density consistent with steatosis. Mild focal fatty sparing adjacent the gallbladder fossa. Possible small layering gallstone without pericholecystic inflammation. No biliary dilatation. Pancreas: No ductal dilatation or inflammation. Spleen: Normal in size without focal abnormality. Adrenals/Urinary Tract: Normal adrenal glands. No hydronephrosis or perinephric edema. Homogeneous renal enhancement with symmetric excretion on delayed phase imaging. Urinary bladder is partially distended without wall thickening. Stomach/Bowel: Dilated edematous loop of small bowel in the midline pelvis with wall thickening and adjacent mesenteric edema, just proximal to enteric chain sutures where there is a transition point just to the right of  midline, series 2, image 70. Dilated small bowel is short segment, however the more proximal small bowel appear fluid-filled. The distal small bowel are decompressed. There is no pneumatosis or perforation. Mesenteric edema from the small bowel inflammation abuts the sigmoid colon in the region of innumerable colonic diverticula, however inflammation related small bowel process is favored rather than diverticulitis. Some areas of sigmoid colonic wall thickening is nonspecific, query underlying mural hypertrophy or reactive. There is rather extensive diffuse colonic diverticulosis, severe in the sigmoid. No abnormal gastric distension. Appendix not visualized, suspected appendectomy with chain sutures noted at the bases cecum. Vascular/Lymphatic: Mild aortic atherosclerosis. No aortic aneurysm. Patent portal vein. Multiple small retroperitoneal and central mesenteric lymph nodes, likely reactive. Reproductive: Status post hysterectomy. No adnexal masses. Other: Mesenteric edema and small amount of free fluid in the pelvis. No perforation or abscess. Postsurgical changes the anterior abdominal wall. In small fat containing umbilical hernia. Musculoskeletal: Degenerative disc disease at L5-S1. Mild scoliotic curvature of the lumbar spine. There are no acute or suspicious osseous abnormalities. IMPRESSION: 1. Findings consistent with early small bowel obstruction. Dilated edematous loop of small bowel in the midline pelvis with wall thickening and adjacent mesenteric edema, just proximal to enteric chain sutures where there is a transition point. No perforation or abscess. 2. Extensive diffuse colonic diverticulosis, severe in the sigmoid. Some areas of sigmoid colonic wall thickening is nonspecific, query underlying mural hypertrophy or reactive. 3. Hepatic steatosis. 4. Possible small layering gallstone without pericholecystic inflammation. Aortic Atherosclerosis (ICD10-I70.0). Electronically Signed   By: MKeith RakeM.D.   On: 04/29/2020 01:29   MR Shoulder Right Wo Contrast  Result Date: 04/19/2020 CLINICAL DATA:  Chronic right shoulder pain.  No prior surgery. EXAM: MRI OF THE RIGHT SHOULDER WITHOUT CONTRAST TECHNIQUE: Multiplanar, multisequence MR imaging of the shoulder was performed. No intravenous contrast was administered. COMPARISON:  Right shoulder x-rays dated March 18, 2019. FINDINGS: Rotator cuff: Mild-to-moderate supraspinatus tendinosis with bursal surface fraying at the insertion. Small focus of low T2 and T1 signal in the anterior tendon at the insertion, consistent with calcium hydroxyapatite. The infraspinatus, teres minor, and subscapularis tendons are unremarkable. Muscles: No atrophy or abnormal signal of the muscles of the rotator cuff. Biceps  long head:  Intact and normally positioned. Acromioclavicular Joint: Mild arthropathy of the acromioclavicular joint. Type I acromion. Small amount of fluid in the subacromial/subdeltoid bursa. Glenohumeral Joint: No joint effusion. No chondral defect. Labrum: Grossly intact, but evaluation is limited by lack of intraarticular fluid. Bones: No acute fracture or dislocation. No suspicious bone lesion. Reactive subcortical cystic changes in the posterior greater tuberosity. Other: None. IMPRESSION: 1. Supraspinatus calcific tendinosis and bursal surface fraying at the insertion. No high-grade rotator cuff tear. 2. Mild acromioclavicular osteoarthritis. 3. Mild subacromial/subdeltoid bursitis. Electronically Signed   By: [name] [name]   On: 04/19/2020 07:21   MR KNEE RIGHT WO CONTRAST  Result Date: 04/19/2020 CLINICAL DATA:  Chronic right knee pain. EXAM: MRI OF THE RIGHT KNEE WITHOUT CONTRAST TECHNIQUE: Multiplanar, multisequence MR imaging of the knee was performed. No intravenous contrast was administered. COMPARISON:  Right knee x-rays dated March 18, 2019. FINDINGS: MENISCI Medial meniscus:  Intact. Lateral meniscus:  Intact.  LIGAMENTS Cruciates:  Intact ACL and PCL. Collaterals: Medial collateral ligament is intact. Lateral collateral ligament complex is intact. CARTILAGE Patellofemoral:  No chondral defect. Medial:  Mild diffuse cartilage thinning without focal defect. Lateral:  No chondral defect. Joint: Trace joint effusion. 8 mm ganglion cyst in the anteromedial portion of Hoffa's fat. Popliteal Fossa:  No Baker cyst. Intact popliteus tendon. Extensor Mechanism: Intact quadriceps tendon and patellar tendon. Intact medial and lateral patellar retinaculum. Intact MPFL. Bones: No focal marrow signal abnormality. No fracture or dislocation. Other: None. IMPRESSION: 1. No evidence of internal derangement. 2. Mild medial compartment degenerative changes. Electronically Signed   By: [name] [name]   On: 04/19/2020 07:16   DG Abd Portable 1V  Result Date: 04/30/2020 CLINICAL DATA:  Bowel obstruction. EXAM: PORTABLE ABDOMEN - 1 VIEW COMPARISON:  CT abdomen pelvis 04/29/2020 FINDINGS: The bowel gas pattern is normal. No radio-opaque calculi or other significant radiographic abnormality are seen. IMPRESSION: Negative. Electronically Signed   By: Franchot Gallo [name]   On: 04/30/2020 10:48     Time coordinating discharge: Over 30 minutes    Dwyane Dee, MD  [facility] 04/30/2020, 4:04 PM

## 2020-04-30 NOTE — Hospital Course (Signed)
Melanie [name] is a 54 yo CF with PMH recurrent SBO, HTN, DMII who presented originally to [facility] with abdominal pain and nausea.  CT A/P showed early SBO. She was kept NPO and transferred to [facility]. She did not require placement of NGT. General surgery was consulted and she underwent serial abdominal xrays. She was started on CLD which was slowly advanced as she had return of flatus and improvement of her pain.  She had an unremarkable abd xray and tolerated regular diet prior to discharge; she was considered stable for discharging home. Surgery has recommended outpatient CLN after resolution of her current episode.

## 2020-04-30 NOTE — Progress Notes (Signed)
Patient was given discharge instructions, and all questions were answered.  Patient was walked to main exit.

## 2020-04-30 NOTE — Progress Notes (Signed)
Patient has c/o of IV site pain, requested IV to be removed. IV removed and medical staff on call notified via paging system that IV was removed and patient is declining new IV. Medical staff on call is aware and will allow patient to wait til AM and get new IV depending on MDs orders. Dawson Bills, RN

## 2020-04-30 NOTE — Progress Notes (Signed)
Assessment & Plan: Partial small bowel obstruction, recurrent             Tolerated full liquid diet, passing flatus             Advance to regular diet this AM             OOB, ambulation             Will check AXR this AM  Patient to try regular diet and ambulate.  If tolerated and AXR without significant findings, will likely be ready for discharge home later today.  Will follow.        Melanie Gemma, MD       [facility]       Office: [phone]   Chief Complaint: Small bowel obstruction, recurrent  Subjective: Patient in bed, comfortable.  No nausea or emesis.  Passing flatus, no urge for BM.  Objective: Vital signs in last 24 hours: Temp:  [97.3 F (36.3 C)-98.1 F (36.7 C)] 97.3 F (36.3 C) (10/28 2330) Pulse Rate:  [66-78] 66 (10/28 0633) Resp:  [17-22] 18 (10/28 0762) BP: (108-137)/(64-90) 108/64 (10/28 2633) SpO2:  [96 %-99 %] 96 % (10/28 0633) Last BM Date: 04/28/20  Intake/Output from previous day: 10/27 0701 - 10/28 0700 In: 5156.9 [P.O.:2040; I.V.:3116.9] Out: 2900 [Urine:2900] Intake/Output this shift: No intake/output data recorded.  Physical Exam: HEENT - sclerae clear, mucous membranes moist Neck - soft Chest - clear bilaterally Cor - RRR Abdomen - soft without distension; minimal tenderness suprapubic; no guarding; no mass Ext - no edema, non-tender Neuro - alert & oriented, no focal deficits  Lab Results:  Recent Labs    04/28/20 2343 04/30/20 0346  WBC 8.7 5.5  HGB 13.8 12.6  HCT 39.9 37.1  PLT 333 274   BMET Recent Labs    04/28/20 2343 04/30/20 0346  NA 136 141  K 3.4* 3.8  CL 102 108  CO2 24 25  GLUCOSE 127* 118*  BUN 14 8  CREATININE 0.69 0.57  CALCIUM 8.9 9.1   PT/INR No results for input(s): LABPROT, INR in the last 72 hours. Comprehensive Metabolic Panel:    Component Value Date/Time   NA 141 04/30/2020 0346   NA 136 04/28/2020 2343   K 3.8 04/30/2020 0346   K 3.4 (L) 04/28/2020 2343    CL 108 04/30/2020 0346   CL 102 04/28/2020 2343   CO2 25 04/30/2020 0346   CO2 24 04/28/2020 2343   BUN 8 04/30/2020 0346   BUN 14 04/28/2020 2343   CREATININE 0.57 04/30/2020 0346   CREATININE 0.69 04/28/2020 2343   CREATININE 0.69 04/14/2020 1022   CREATININE 0.69 11/27/2018 1340   GLUCOSE 118 (H) 04/30/2020 0346   GLUCOSE 127 (H) 04/28/2020 2343   CALCIUM 9.1 04/30/2020 0346   CALCIUM 8.9 04/28/2020 2343   AST 49 (H) 04/28/2020 2343   AST 37 (H) 04/14/2020 1022   ALT 69 (H) 04/28/2020 2343   ALT 64 (H) 04/14/2020 1022   ALKPHOS 70 04/28/2020 2343   ALKPHOS 84 01/03/2019 1945   BILITOT 0.9 04/28/2020 2343   BILITOT 0.5 04/14/2020 1022   PROT 7.3 04/28/2020 2343   PROT 7.0 04/14/2020 1022   ALBUMIN 4.1 04/28/2020 2343   ALBUMIN 4.0 01/03/2019 1945    Studies/Results: CT ABDOMEN PELVIS W CONTRAST  Result Date: 04/29/2020 CLINICAL DATA:  Lower abdominal pain for 1 day. Bowel obstruction suspected. EXAM: CT ABDOMEN AND PELVIS WITH CONTRAST TECHNIQUE:  Multidetector CT imaging of the abdomen and pelvis was performed using the standard protocol following bolus administration of intravenous contrast. CONTRAST:  145m OMNIPAQUE IOHEXOL 300 MG/ML  SOLN COMPARISON:  None. FINDINGS: Lower chest: The lung bases are clear. Hepatobiliary: Diffusely decreased hepatic density consistent with steatosis. Mild focal fatty sparing adjacent the gallbladder fossa. Possible small layering gallstone without pericholecystic inflammation. No biliary dilatation. Pancreas: No ductal dilatation or inflammation. Spleen: Normal in size without focal abnormality. Adrenals/Urinary Tract: Normal adrenal glands. No hydronephrosis or perinephric edema. Homogeneous renal enhancement with symmetric excretion on delayed phase imaging. Urinary bladder is partially distended without wall thickening. Stomach/Bowel: Dilated edematous loop of small bowel in the midline pelvis with wall thickening and adjacent mesenteric  edema, just proximal to enteric chain sutures where there is a transition point just to the right of midline, series 2, image 70. Dilated small bowel is short segment, however the more proximal small bowel appear fluid-filled. The distal small bowel are decompressed. There is no pneumatosis or perforation. Mesenteric edema from the small bowel inflammation abuts the sigmoid colon in the region of innumerable colonic diverticula, however inflammation related small bowel process is favored rather than diverticulitis. Some areas of sigmoid colonic wall thickening is nonspecific, query underlying mural hypertrophy or reactive. There is rather extensive diffuse colonic diverticulosis, severe in the sigmoid. No abnormal gastric distension. Appendix not visualized, suspected appendectomy with chain sutures noted at the bases cecum. Vascular/Lymphatic: Mild aortic atherosclerosis. No aortic aneurysm. Patent portal vein. Multiple small retroperitoneal and central mesenteric lymph nodes, likely reactive. Reproductive: Status post hysterectomy. No adnexal masses. Other: Mesenteric edema and small amount of free fluid in the pelvis. No perforation or abscess. Postsurgical changes the anterior abdominal wall. In small fat containing umbilical hernia. Musculoskeletal: Degenerative disc disease at L5-S1. Mild scoliotic curvature of the lumbar spine. There are no acute or suspicious osseous abnormalities. IMPRESSION: 1. Findings consistent with early small bowel obstruction. Dilated edematous loop of small bowel in the midline pelvis with wall thickening and adjacent mesenteric edema, just proximal to enteric chain sutures where there is a transition point. No perforation or abscess. 2. Extensive diffuse colonic diverticulosis, severe in the sigmoid. Some areas of sigmoid colonic wall thickening is nonspecific, query underlying mural hypertrophy or reactive. 3. Hepatic steatosis. 4. Possible small layering gallstone without  pericholecystic inflammation. Aortic Atherosclerosis (ICD10-I70.0). Electronically Signed   By: MKeith RakeM.D.   On: 04/29/2020 01:29      TArmandina Gemma10/28/2021  Patient ID: EDaylene Katayama female   DOB: 130-Mar-1967 54y.o.   MRN: [id number]

## 2020-05-01 ENCOUNTER — Other Ambulatory Visit: Payer: Self-pay | Admitting: *Deleted

## 2020-05-01 NOTE — Patient Outreach (Signed)
[facility]) Care Management  05/01/2020  Melanie [name] 1965/08/26 [id number]  Transition of care telephone call  Referral received:04/30/20 Initial outreach:05/01/20 Insurance: [facility]   Initial unsuccessful telephone call to patient's preferred number in order to complete transition of care assessment; no answer, left HIPAA compliant voicemail message requesting return call.   Objective: Per the electronic medical record,Melanie [name] was hospitalized at [facility]  From 10/27-10/21/21  For Small bowel Obstruction  . Comorbidities include:Hypertension , Diabetes, Hyperlipidemia, OSA on CPAP.  She was discharged to home on 04/30/20 without the need for home health services or durable medical equipment per the discharge summary.   Plan: This RNCM will route unsuccessful outreach letter with [place] Management pamphlet and 24 hour Nurse Advice Line Magnet to [place] Management clinical pool to be mailed to patient's home address. This RNCM will attempt another outreach within 4 business days.  Joylene Draft, RN, BSN  [place] Management Coordinator  [phone]- Mobile [phone]- Toll Free Main Office

## 2020-05-04 ENCOUNTER — Ambulatory Visit (INDEPENDENT_AMBULATORY_CARE_PROVIDER_SITE_OTHER): Payer: No Typology Code available for payment source | Admitting: Sports Medicine

## 2020-05-04 ENCOUNTER — Encounter: Payer: Self-pay | Admitting: Physical Therapy

## 2020-05-04 ENCOUNTER — Other Ambulatory Visit: Payer: Self-pay

## 2020-05-04 ENCOUNTER — Encounter: Payer: Self-pay | Admitting: Sports Medicine

## 2020-05-04 ENCOUNTER — Ambulatory Visit (INDEPENDENT_AMBULATORY_CARE_PROVIDER_SITE_OTHER): Payer: No Typology Code available for payment source | Admitting: Physical Therapy

## 2020-05-04 DIAGNOSIS — M5030 Other cervical disc degeneration, unspecified cervical region: Secondary | ICD-10-CM

## 2020-05-04 DIAGNOSIS — M6281 Muscle weakness (generalized): Secondary | ICD-10-CM

## 2020-05-04 DIAGNOSIS — M25511 Pain in right shoulder: Secondary | ICD-10-CM

## 2020-05-04 DIAGNOSIS — G8929 Other chronic pain: Secondary | ICD-10-CM

## 2020-05-04 DIAGNOSIS — M170 Bilateral primary osteoarthritis of knee: Secondary | ICD-10-CM | POA: Diagnosis not present

## 2020-05-04 DIAGNOSIS — R29898 Other symptoms and signs involving the musculoskeletal system: Secondary | ICD-10-CM | POA: Diagnosis not present

## 2020-05-04 DIAGNOSIS — M542 Cervicalgia: Secondary | ICD-10-CM

## 2020-05-04 DIAGNOSIS — M1811 Unilateral primary osteoarthritis of first carpometacarpal joint, right hand: Secondary | ICD-10-CM | POA: Diagnosis not present

## 2020-05-04 NOTE — Assessment & Plan Note (Signed)
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
## 2020-05-04 NOTE — Assessment & Plan Note (Signed)
Right-sided C7 distribution radiculitis, neck, periscapular pain, improving with Lyrica once a day, she will slowly up taper to 3 times daily. Holding off on cervical spine MRI for now.

## 2020-05-04 NOTE — Progress Notes (Signed)
    Procedures performed today:    None.  Independent interpretation of notes and tests performed by another provider:   None.  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  DDD (degenerative disc disease), cervical Right-sided C7 distribution radiculitis, neck, periscapular pain, improving with Lyrica once a day, she will slowly up taper to 3 times daily. Holding off on cervical spine MRI for now.  Primary osteoarthritis of both knees Right knee is actually starting to do better, MRI just showed osteoarthritis, no change in plan, viscosupplementation would be the next step, she understands to give [place] some time to get this approved should she desire to do it.  Primary osteoarthritis of first carpometacarpal joint of right hand Partial response to Celebrex, partial response to first extensor compartment injection, she will get a thumb loop, and we can proceed with first [facility] joint injection in 6 weeks if still having discomfort.    ___________________________________________ [name]. [name], [name], ABFM., CAQSM. Primary Care and [place] Instructor of [employer] [facility] [employer]

## 2020-05-04 NOTE — Assessment & Plan Note (Signed)
Right knee is actually starting to do better, MRI just showed osteoarthritis, no change in plan, viscosupplementation would be the next step, she understands to give [place] some time to get this approved should she desire to do it.

## 2020-05-04 NOTE — Assessment & Plan Note (Signed)
Partial response to Celebrex, partial response to first extensor compartment injection, she will get a thumb loop, and we can proceed with first [facility] joint injection in 6 weeks if still having discomfort.

## 2020-05-06 ENCOUNTER — Telehealth: Payer: Self-pay

## 2020-05-06 ENCOUNTER — Other Ambulatory Visit: Payer: Self-pay | Admitting: *Deleted

## 2020-05-06 NOTE — Patient Outreach (Signed)
[facility]) Care Management  05/06/2020  Melanie [name] December 25, 1965 [id number]   Transition of care call Referral received: 04/30/20 Initial outreach attempt: 05/01/20 Insurance: [employer]    2nd unsuccessful telephone call to patient's preferred contact number in order to complete post hospital discharge transition of care assessment , no answer left HIPAA compliant message requesting return call.    Objective: Per the electronic medical record,Melanie [name] was hospitalized at [facility]  From 10/27-10/21/21  For Small bowel Obstruction  . Comorbidities include:Hypertension , Diabetes, Hyperlipidemia, OSA on CPAP.  She was discharged to home on 04/30/20 without the need for home health services or durable medical equipment per the discharge summary.   Plan If no return call from patient will attempt 3rd outreach in the next 4 business days.    Melanie Draft, RN, BSN  [place] Management Coordinator  [phone]- Mobile [phone]- Toll Free Main Office

## 2020-05-06 NOTE — Telephone Encounter (Signed)
Melanie [name] called today to let us know that she would be faxing forms to be completed for patient's short term disability claim # [id number].

## 2020-05-07 ENCOUNTER — Ambulatory Visit (INDEPENDENT_AMBULATORY_CARE_PROVIDER_SITE_OTHER): Payer: No Typology Code available for payment source | Admitting: Medical-Surgical

## 2020-05-07 ENCOUNTER — Ambulatory Visit (INDEPENDENT_AMBULATORY_CARE_PROVIDER_SITE_OTHER): Payer: No Typology Code available for payment source | Admitting: Rehabilitative and Restorative Service Providers"

## 2020-05-07 ENCOUNTER — Other Ambulatory Visit: Payer: Self-pay

## 2020-05-07 VITALS — BP 139/86 | HR 75 | Ht 67.0 in | Wt 224.0 lb

## 2020-05-07 DIAGNOSIS — M25511 Pain in right shoulder: Secondary | ICD-10-CM | POA: Diagnosis not present

## 2020-05-07 DIAGNOSIS — M542 Cervicalgia: Secondary | ICD-10-CM

## 2020-05-07 DIAGNOSIS — R29898 Other symptoms and signs involving the musculoskeletal system: Secondary | ICD-10-CM | POA: Diagnosis not present

## 2020-05-07 DIAGNOSIS — M6281 Muscle weakness (generalized): Secondary | ICD-10-CM | POA: Diagnosis not present

## 2020-05-07 DIAGNOSIS — I10 Essential (primary) hypertension: Secondary | ICD-10-CM

## 2020-05-07 NOTE — Progress Notes (Signed)
   Subjective:    Patient ID: Melanie [name], female    DOB: 05/13/1966, 54 y.o.   MRN: [id number]  HPI Patient presents to office for blood pressure check. Denies any complications with Losartan.    Review of Systems     Objective:   Physical Exam        Assessment & Plan:  Patient tells me that she has only been on Losartan for about 2 or 3 doses due to a recent hospitalization. She states that she did not notice any side effects. First blood pressure check was elevated at 141/82, which was expected. Patient sat in room for 10 minutes and recheck came down to 136/86.   Advised patient this was not surprising since she has had minimal doses of new medication. Advised patient to take medication daily and recheck in 1 week with nurse.

## 2020-05-07 NOTE — Therapy (Signed)
[place] [address] [place], [place], [address] Phone: [phone]   Fax:  [phone]  Physical Therapy Treatment and Renewal Summary  Patient Details  Name: Melanie [name] MRN: [id number] Date of Birth: 1966-01-25 Referring Provider (PT): Aundria Mems, MD   Encounter Date: 05/07/2020   PT End of Session - 05/07/20 1314    Visit Number 8    Number of Visits 12    Date for PT Re-Evaluation 05/07/20    Authorization Type UMR    PT Start Time 1150    PT Stop Time 1246    PT Time Calculation (min) 56 min    Activity Tolerance Patient limited by pain;Patient tolerated treatment well    Behavior During Therapy WFL for tasks assessed/performed           Past Medical History:  Diagnosis Date   Bowel obstruction ([place])    Diabetes mellitus without complication ([place])    Hypertension     Past Surgical History:  Procedure Laterality Date   ABDOMINAL HYSTERECTOMY     MYOMECTOMY      There were no vitals filed for this visit.   Subjective Assessment - 05/07/20 1157    Subjective The patient is having 4/10 pain today.  She is tolerating exercises regularly.    Pertinent History HTN, hypothyroidism, ? fibromyalgia    Patient Stated Goals "Not be aware of my right arm constantly and improve range of motion."    Currently in Pain? Yes    Pain Score 4     Pain Location Shoulder    Pain Orientation Right    Pain Descriptors / Indicators Aching    Pain Radiating Towards between shoulder blades and neck    Pain Onset More than a month ago    Pain Frequency Constant    Aggravating Factors  reaching    Pain Relieving Factors medicine, heat              [facility] PT Assessment - 05/07/20 1159      Assessment   Medical Diagnosis cervical DDD    Referring Provider (PT) Aundria Mems, MD    Onset Date/Surgical Date 03/26/20    Hand Dominance Right                          [facility] Adult PT Treatment/Exercise - 05/07/20 1159      Exercises   Exercises Shoulder;Neck      Shoulder Exercises: Supine   Protraction Strengthening;Right;10 reps    Protraction Limitations 10 reps with cues    Horizontal ABduction AROM;Both;5 reps    Horizontal ABduction Limitations "zingers" in hands x 5 reps    External Rotation PROM;AROM;Right;10 reps    Internal Rotation PROM;Right;AROM;10 reps    Flexion PROM;AROM;Right;10 reps      Shoulder Exercises: Standing   Flexion AROM;AAROM;Right;10 reps    Flexion Limitations wall circles and flexion within tolerance x 10 reps    Other Standing Exercises wall arm circles      Shoulder Exercises: ROM/Strengthening   UBE (Upper Arm Bike) L1 x 90 seconds forward/backward      Shoulder Exercises: Stretch   Corner Stretch 2 reps;30 seconds    Corner Stretch Limitations with arms at W and extended for bicipital stretch      Manual Therapy   Manual Therapy Joint mobilization;Soft tissue mobilization    Manual therapy comments skilled palpation to assess tissue response to STM and dry  needling    Joint Mobilization CPA mid to upper thoracic spine; cervical downglies in supine and L sidelying on R c-spine    Soft tissue mobilization STM R upper trap, R deltoids            Trigger Point Dry Needling - 05/07/20 1259    Consent Given? Yes    Education Handout Provided Previously provided    Muscles Treated Head and Neck Upper trapezius;Cervical multifidi    Muscles Treated Upper Quadrant Deltoid;Biceps    Other Dry Needling R c-spine and UE    Upper Trapezius Response Twitch reponse elicited;Palpable increased muscle length   pain/soreness post needling-- to use heat and DN   Cervical multifidi Response Palpable increased muscle length    Deltoid Response Palpable increased muscle length;Twitch response elicited    Biceps Response Palpable increased muscle length                     PT Long Term Goals - 05/07/20 1328        PT LONG TERM GOAL #1   Title The patient will be indep with HEP.    Time 6    Period Weeks    Status Partially Met      PT LONG TERM GOAL #2   Title The patient will reduce functional limitation per FOTO from 65% to < or equal to 47%.    Time 6    Period Weeks    Status On-going      PT LONG TERM GOAL #3   Title The patient will reduce pain from m7/10 to < or equal to 5/10.    Baseline 4/10 upon arrival today    Time 6    Period Weeks    Status Achieved      PT LONG TERM GOAL #4   Title The patient will improve grip strength R hand to equal L.    Time 6    Period Weeks    Status On-going             UPDATED LONG TERM GOALS:    PT Long Term Goals - 05/07/20 1335      PT LONG TERM GOAL #1   Title The patient will be indep with HEP.    Time 6    Period Weeks    Status Revised    Target Date 06/18/20      PT LONG TERM GOAL #2   Title The patient will reduce functional limitation per FOTO from 65% to < or equal to 47%.    Time 6    Period Weeks    Status Revised    Target Date 06/18/20      PT LONG TERM GOAL #3   Title The patient will report resting pain < or equal to 3/10.    Time 6    Period Weeks    Status Revised    Target Date 06/18/20      PT LONG TERM GOAL #4   Title The patient will improve grip strength R hand to equal L.    Time 6    Period Weeks    Status Revised    Target Date 06/18/20              Plan - 05/07/20 1332    Clinical Impression Statement The patient met LTG for pain level and partially met LTG for HEP.  She has been seen for 8 out of 12 recommended visits in first  6 weeks of therapy.  PT is continuing to progress ROM, strengthening and work to reduce pain in order ot return to prior functional status.  Plan to conitnue working to updated LTGs.    PT Frequency 2x / week    PT Duration 6 weeks    PT Treatment/Interventions ADLs/Self Care Home Management;Patient/family education;Therapeutic activities;Therapeutic  exercise;Electrical Stimulation;Traction;Moist Heat;Cryotherapy;Taping;Dry needling;Manual techniques;Iontophoresis 78m/ml Dexamethasone    PT Next Visit Plan Progress HEP for cspine and shoulder; postural strengthening to tolerance. Modalities as needed for pain    PT Home Exercise Plan Access Code: [email or web address]   Consulted and Agree with Plan of Care Patient           Patient will benefit from skilled therapeutic intervention in order to improve the following deficits and impairments:  Pain, Decreased range of motion, Decreased strength, Impaired flexibility, Hypomobility, Impaired sensation, Postural dysfunction, Decreased activity tolerance  Visit Diagnosis: Cervicalgia  Acute pain of right shoulder  Muscle weakness (generalized)  Other symptoms and signs involving the musculoskeletal system     Problem List Patient Active Problem List   Diagnosis Date Noted   SBO (small bowel obstruction) ([place] 04/29/2020   HLD (hyperlipidemia) 04/29/2020   Type 2 diabetes mellitus without complication, without long-term current use of insulin ([place] 04/24/2020   Acute cystitis without hematuria 06/24/2019   Tenosynovitis, de Quervain 06/24/2019   Primary osteoarthritis of first carpometacarpal joint of right hand 03/18/2019   Chronic right shoulder pain 03/18/2019   Bilateral foot pain 03/18/2019   Primary osteoarthritis of both knees 03/18/2019   Fibromyalgia 03/15/2019   Lumbar degenerative disc disease 12/19/2018   Myalgia 11/27/2018   Morning stiffness of joints 11/27/2018   DDD (degenerative disc disease), cervical 11/27/2018   Swelling of left upper eyelid 09/24/2018   Attention deficit hyperactivity disorder (ADHD), predominantly inattentive type 02/27/2018   Arthralgia 02/27/2018   Screening for malignant neoplasm of colon 02/27/2018   H/O: hysterectomy 02/27/2018   History of prediabetes 02/27/2018   Hypothyroidism 02/27/2018   NASH (nonalcoholic  steatohepatitis) 02/27/2018   GAD (generalized anxiety disorder) 01/06/2017   Moderate episode of recurrent major depressive disorder ([place] 01/06/2017   Environmental allergies 09/12/2015   Essential hypertension 09/12/2015   OSA on CPAP 09/12/2015    German Manke 05/07/2020, 1:34 PM  [facility] [address]erville[place] [address]Phone: [phone]  Fax:  [phone] Name: ERegene MccarthyMRN: [id number] of Birth: [birth year]

## 2020-05-07 NOTE — Telephone Encounter (Signed)
This was sent around 11:50 am. I am sure she is not still there.

## 2020-05-11 ENCOUNTER — Other Ambulatory Visit: Payer: Self-pay | Admitting: *Deleted

## 2020-05-11 NOTE — Patient Outreach (Signed)
[facility]) Care Management  05/11/2020  Melanie [name] 06-Sep-1965 [id number]  Transition of care call Referral received: 04/30/20 Initial outreach attempt: 05/01/20 Insurance: [employer] unsuccessful telephone call to patient's preferred contact number in order to complete post hospital discharge transition of care assessment; no answer, left HIPAA compliant message requesting return call.   Objective: Per the electronic medical record,Melanie Taylorwas [facility]21 For Small bowel Obstruction . Comorbidities include:Hypertension , Diabetes, Hyperlipidemia, OSA on CPAP. She was discharged to home on10/28/21without the need for home health services or durable medical equipment per the discharge summary.   Plan: If no return call from patient,will plan outreach in the next 3 weeks.   Joylene Draft, RN, BSN  [place] Management Coordinator  [phone]- Mobile [phone]- Toll Free Main Office

## 2020-05-12 ENCOUNTER — Ambulatory Visit: Payer: No Typology Code available for payment source | Admitting: Sports Medicine

## 2020-05-13 ENCOUNTER — Encounter: Payer: No Typology Code available for payment source | Admitting: Physical Therapy

## 2020-05-15 ENCOUNTER — Encounter: Payer: Self-pay | Admitting: Sports Medicine

## 2020-05-15 ENCOUNTER — Ambulatory Visit: Payer: No Typology Code available for payment source | Admitting: Rehabilitative and Restorative Service Providers"

## 2020-05-15 ENCOUNTER — Other Ambulatory Visit: Payer: Self-pay | Admitting: Sports Medicine

## 2020-05-15 ENCOUNTER — Ambulatory Visit (INDEPENDENT_AMBULATORY_CARE_PROVIDER_SITE_OTHER): Payer: No Typology Code available for payment source | Admitting: Sports Medicine

## 2020-05-15 ENCOUNTER — Telehealth: Payer: Self-pay

## 2020-05-15 ENCOUNTER — Other Ambulatory Visit: Payer: Self-pay

## 2020-05-15 DIAGNOSIS — M5030 Other cervical disc degeneration, unspecified cervical region: Secondary | ICD-10-CM

## 2020-05-15 DIAGNOSIS — M542 Cervicalgia: Secondary | ICD-10-CM | POA: Diagnosis not present

## 2020-05-15 DIAGNOSIS — G8929 Other chronic pain: Secondary | ICD-10-CM | POA: Diagnosis not present

## 2020-05-15 DIAGNOSIS — M25511 Pain in right shoulder: Secondary | ICD-10-CM | POA: Diagnosis not present

## 2020-05-15 DIAGNOSIS — R29898 Other symptoms and signs involving the musculoskeletal system: Secondary | ICD-10-CM

## 2020-05-15 DIAGNOSIS — M6281 Muscle weakness (generalized): Secondary | ICD-10-CM

## 2020-05-15 DIAGNOSIS — M79641 Pain in right hand: Secondary | ICD-10-CM

## 2020-05-15 MED ORDER — PREGABALIN 50 MG PO CAPS: ORAL_CAPSULE | ORAL | 3 refills | Status: DC

## 2020-05-15 MED FILL — PREGABALIN 50 MG CAPS: 50 | 30 days supply | Qty: 90 | Fill #0

## 2020-05-15 NOTE — Addendum Note (Signed)
Addended by: Silverio Decamp on: 05/15/2020 04:54 PM   Modules accepted: Orders

## 2020-05-15 NOTE — Telephone Encounter (Signed)
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  She will need to have another referral placed.

## 2020-05-15 NOTE — Assessment & Plan Note (Addendum)
Right-sided C7 distribution radiculitis, increasing Lyrica to 50 mg twice daily to 3 times daily. She did have an MRI last year, proceeding with a right C6-C7 interlaminar epidural. Sending her to [facility] imaging. Return to see me a couple weeks after the injection.

## 2020-05-15 NOTE — Progress Notes (Addendum)
    Procedures performed today:    None.  Independent interpretation of notes and tests performed by another provider:   None.  Brief History, Exam, Impression, and Recommendations:    DDD (degenerative disc disease), cervical Right-sided C7 distribution radiculitis, increasing Lyrica to 50 mg twice daily to 3 times daily. She did have an MRI last year, proceeding with a right C6-C7 interlaminar epidural. Sending her to [facility] imaging. Return to see me a couple weeks after the injection.  Chronic right shoulder pain Starting to improve, we filled out a great deal of disability paperwork today. Return to see me after the above epidural.    ___________________________________________ [name]. [name], [name], ABFM., CAQSM. Primary Care and [place] Instructor of [employer] [facility] [employer]

## 2020-05-15 NOTE — Therapy (Signed)
Melanie [name]  Melanie [name], [place], [address] Phone: [phone]   Fax:  [phone]  Physical Therapy Treatment  Patient Details  Name: Melanie [name] MRN: [id number] Date of Birth: [birth year] Referring Provider (PT): Aundria Mems, MD   Encounter Date: 05/15/2020   PT End of Session - 05/15/20 1023    Visit Number 9    Number of Visits 20    Date for PT Re-Evaluation 06/18/20    Authorization Type UMR    PT Start Time 1024    PT Stop Time 1105    PT Time Calculation (min) 41 min    Activity Tolerance Patient limited by pain;Patient tolerated treatment well    Behavior During Therapy [facility] for tasks assessed/performed           Past Medical History:  Diagnosis Date  . Bowel obstruction ([place])   . Diabetes mellitus without complication ([place])   . Hypertension     Past Surgical History:  Procedure Laterality Date  . ABDOMINAL HYSTERECTOMY    . MYOMECTOMY      There were no vitals filed for this visit.   Subjective Assessment - 05/15/20 1023    Subjective The patient reports she is feeling discouraged.  She reports her low back pain has been worse this week and notes excruciating pain in the L low back.  She feels that range of motion and pain in her right shoulder are improving.  The hand numbness in the R and the R wrist pain is worse.  "I'm at my wit's end."    Pertinent History HTN, hypothyroidism, ? fibromyalgia    Diagnostic tests Multilevel degenerative change in the cervical spine as above.Moderate right foraminal encroachment C5-6 and C6-7 due to spurring.Mild left foraminal encroachment C5-6 and C6-7 due to spurring.    Patient Stated Goals "Not be aware of my right arm constantly and improve range of motion."    Currently in Pain? Yes    Pain Score 3     Pain Location Shoulder    Pain Orientation Right    Pain Descriptors / Indicators Discomfort    Pain Onset More than a month ago     Pain Frequency Constant    Aggravating Factors  parascapular pain is still worse on R side, R wrist and hand painful    Pain Relieving Factors unsure    Effect of Pain on Daily Activities She reports no longer afraid to move and reach for items    Multiple Pain Sites Yes    Pain Score 9    Pain Location Hand    Pain Orientation Right    Pain Descriptors / Indicators Numbness;Aching    Pain Type Acute pain    Pain Radiating Towards first 3 digits and radial aspect of wrist    Pain Onset More than a month ago    Pain Frequency Constant    Aggravating Factors  "beyond pins and needles"    Pain Relieving Factors unsure    Effect of Pain on Daily Activities worse with grasping    Pain Score 7    Pain Location Scapula    Pain Orientation Right    Pain Descriptors / Indicators Aching    Pain Type Chronic pain    Pain Onset More than a month ago    Pain Frequency Constant    Aggravating Factors  squeezing shoulder blades    Pain Relieving Factors unsure    Effect of Pain on Daily  Activities aggravates her during the day    Pain Score 9    Pain Location Back    Pain Orientation Left    Pain Type Chronic pain    Pain Radiating Towards L hip    Pain Onset More than a month ago    Pain Frequency Constant    Aggravating Factors  goes up to a 10, hurts with touch    Pain Relieving Factors unsure    Effect of Pain on Daily Activities impacting daily activities and bending              [facility] PT Assessment - 05/15/20 1024      Assessment   Medical Diagnosis cervical DDD    Referring Provider (PT) Aundria Mems, MD    Onset Date/Surgical Date 03/26/20    Hand Dominance Right                         [facility] Adult PT Treatment/Exercise - 05/15/20 1247      Self-Care   Self-Care Other Self-Care Comments    Other Self-Care Comments  Discussed pain threshold and "alarm" signaling early for pain due to chronic pain.  Discussed resources available to patient as  [employer] Employee (EAP and talk space)  to help with stress reduction and mgmt of chronic pain.  Discussed sleep hygeine.  Also recommended meditation (discussed how counseling may help with this) to help reduce stress.      Manual Therapy   Manual Therapy Joint mobilization;Soft tissue mobilization;Myofascial release;Scapular mobilization    Manual therapy comments To reduce pain; skilled palpation with soft tissue assessment with DN    Joint Mobilization CPA mid and upper t-spine Grade II    Soft tissue mobilization STM to R forearm in wrist flexors/extensors and pronator; abductor pollicis brevis STM and adductor pollicis    Myofascial Release MFR to thenar eminence    Scapular Mobilization Left sidelying scapular mobilization R side            Trigger Point Dry Needling - 05/15/20 1041    Consent Given? Yes    Education Handout Provided Previously provided    Muscles Treated Upper Quadrant Rhomboids   right   Muscles Treated Wrist/Hand Flexor pollicis brevis   and adductor pollicis   Dry Needling Comments right side    Other Dry Needling used rib as bony backdrop during rhomboid DN     Rhomboids Response Palpable increased muscle length    Flexor pollicis brevis Response Palpable increased muscle length                PT Education - 05/15/20 1224    Education Details Availability of counseling resources as [employer] (EAP and talk space), pain neuroscience of increased threshold where pain alarms at first sign of pain, benefits of sleep hygeine and benefits of meditation to manage chronic pain    Person(s) Educated Patient    Methods Explanation    Comprehension Verbalized understanding               PT Long Term Goals - 05/07/20 1335      PT LONG TERM GOAL #1   Title The patient will be indep with HEP.    Time 6    Period Weeks    Status Revised    Target Date 06/18/20      PT LONG TERM GOAL #2   Title The patient will reduce functional limitation per FOTO  from 65%  to < or equal to 47%.    Time 6    Period Weeks    Status Revised    Target Date 06/18/20      PT LONG TERM GOAL #3   Title The patient will report resting pain < or equal to 3/10.    Time 6    Period Weeks    Status Revised    Target Date 06/18/20      PT LONG TERM GOAL #4   Title The patient will improve grip strength R hand to equal L.    Time 6    Period Weeks    Status Revised    Target Date 06/18/20                 Plan - 05/15/20 1023    Clinical Impression Statement The patient continues with reduced pain in her right shoulder.  However, she has 9/10 pain in her right hand and left lumbar spine.  PT used today's session to try to help provide intervention to reduce R hand pain, R scapular pain and educate on pain science principles.  Plan to continue working towards [place] emphasizing increasing mobility.    PT Frequency 2x / week    PT Duration 6 weeks    PT Treatment/Interventions ADLs/Self Care Home Management;Patient/family education;Therapeutic activities;Therapeutic exercise;Electrical Stimulation;Traction;Moist Heat;Cryotherapy;Taping;Dry needling;Manual techniques;Iontophoresis 47m/ml Dexamethasone    PT Next Visit Plan Progress HEP for cspine and shoulder; postural strengthening to tolerance. Modalities as needed for pain    PT Home Exercise Plan Access Code: [email or web address]   Consulted and Agree with Plan of Care Patient           Patient will benefit from skilled therapeutic intervention in order to improve the following deficits and impairments:  Pain, Decreased range of motion, Decreased strength, Impaired flexibility, Hypomobility, Impaired sensation, Postural dysfunction, Decreased activity tolerance  Visit Diagnosis: Cervicalgia  Acute pain of right shoulder  Muscle weakness (generalized)  Other symptoms and signs involving the musculoskeletal system  Pain in right hand     Problem List Patient Active Problem List   Diagnosis Date  Noted  . SBO (small bowel obstruction) ([facility] 04/29/2020  . HLD (hyperlipidemia) 04/29/2020  . Type 2 diabetes mellitus without complication, without long-term current use of insulin ([facility] 04/24/2020  . Acute cystitis without hematuria 06/24/2019  . Tenosynovitis, de Quervain 06/24/2019  . Primary osteoarthritis of first carpometacarpal joint of right hand 03/18/2019  . Chronic right shoulder pain 03/18/2019  . Bilateral foot pain 03/18/2019  . Primary osteoarthritis of both knees 03/18/2019  . Fibromyalgia 03/15/2019  . Lumbar degenerative disc disease 12/19/2018  . Myalgia 11/27/2018  . Morning stiffness of joints 11/27/2018  . DDD (degenerative disc disease), cervical 11/27/2018  . Swelling of left upper eyelid 09/24/2018  . Attention deficit hyperactivity disorder (ADHD), predominantly inattentive type 02/27/2018  . Arthralgia 02/27/2018  . Screening for malignant neoplasm of colon 02/27/2018  . H/O: hysterectomy 02/27/2018  . History of prediabetes 02/27/2018  . Hypothyroidism 02/27/2018  . NASH (nonalcoholic steatohepatitis) 02/27/2018  . GAD (generalized anxiety disorder) 01/06/2017  . Moderate episode of recurrent major depressive disorder ([place] 01/06/2017  . Environmental allergies 09/12/2015  . Essential hypertension 09/12/2015  . OSA on CPAP 09/12/2015    Mariano Doshi , PT 05/15/2020, 1:01 PM  [facility] [address]Phone: [phone]  Fax:  [phone] Name: EJennefer KoppMRN: [id number] of Birth: [birth year]

## 2020-05-15 NOTE — Telephone Encounter (Signed)
Switched to [facility] imaging, please call them to schedule injection.

## 2020-05-15 NOTE — Assessment & Plan Note (Signed)
Starting to improve, we filled out a great deal of disability paperwork today. Return to see me after the above epidural.

## 2020-05-18 NOTE — Telephone Encounter (Signed)
Left message for Tye [place] at [place] with patient name and MRN.

## 2020-05-19 ENCOUNTER — Other Ambulatory Visit: Payer: Self-pay

## 2020-05-19 ENCOUNTER — Ambulatory Visit (INDEPENDENT_AMBULATORY_CARE_PROVIDER_SITE_OTHER): Payer: No Typology Code available for payment source | Admitting: Physical Therapy

## 2020-05-19 DIAGNOSIS — M6281 Muscle weakness (generalized): Secondary | ICD-10-CM | POA: Diagnosis not present

## 2020-05-19 DIAGNOSIS — M25511 Pain in right shoulder: Secondary | ICD-10-CM | POA: Diagnosis not present

## 2020-05-19 DIAGNOSIS — R29898 Other symptoms and signs involving the musculoskeletal system: Secondary | ICD-10-CM

## 2020-05-19 DIAGNOSIS — M542 Cervicalgia: Secondary | ICD-10-CM

## 2020-05-19 DIAGNOSIS — M79641 Pain in right hand: Secondary | ICD-10-CM

## 2020-05-19 NOTE — Therapy (Signed)
[place], [place], [address] Phone: [phone]   Fax:  [phone]  Physical Therapy Treatment  Patient Details  Name: Melanie [name] MRN: [id number] Date of Birth: 1966-06-29 Referring Provider (PT): Aundria Mems, MD   Encounter Date: 05/19/2020   PT End of Session - 05/19/20 1149    Visit Number 10    Number of Visits 20    Date for PT Re-Evaluation 06/18/20    Authorization Type UMR    PT Start Time 1107    PT Stop Time 1147    PT Time Calculation (min) 40 min    Activity Tolerance Patient tolerated treatment well    Behavior During Therapy [facility] for tasks assessed/performed           Past Medical History:  Diagnosis Date  . Bowel obstruction ([place])   . Diabetes mellitus without complication ([place])   . Hypertension     Past Surgical History:  Procedure Laterality Date  . ABDOMINAL HYSTERECTOMY    . MYOMECTOMY      There were no vitals filed for this visit.   Subjective Assessment - 05/19/20 1109    Subjective Last couple of days, her lower back is not bothering her as bad. She voices frustration with her Rt wrist and hand; worse with squeezing/ pulling in hand.    Patient Stated Goals "Not be aware of my right arm constantly and improve range of motion."    Currently in Pain? Yes    Pain Score 6     Pain Location Wrist    Pain Orientation Right    Pain Descriptors / Indicators Numbness;Aching    Pain Score 6    Pain Location Back    Pain Orientation Right;Left;Mid    Pain Descriptors / Indicators Cramping;Aching    Aggravating Factors  being up and moving; end of day.    Pain Relieving Factors rest and heat              [facility] PT Assessment - 05/19/20 0001      Assessment   Medical Diagnosis cervical DDD    Referring Provider (PT) Aundria Mems, MD    Onset Date/Surgical Date 03/26/20    Hand Dominance Right            [facility] Adult PT  Treatment/Exercise - 05/19/20 0001      Neck Exercises: Seated   Other Seated Exercise thoracic ext over back of chair with hands behind head x 5 reps       Neck Exercises: Supine   Other Supine Exercise thoracic ext over yoga mat and pillow at mid back, hands supporting head x 4 reps of 10-15 sec each       Lumbar Exercises: Quadruped   Single Arm Raise Right;5 reps   small range, within tolerance   Single Arm Raises Limitations (while laying on green swiss ball)    Other Quadruped Lumbar Exercises while laying on green swiss ball: weight shift back and forth, all with axial ext.- mini push ups x 5 reps     Other Quadruped Lumbar Exercises trial of rolling ball forward when kneeling, with hip hinge - not tolerated in knees; trial in standing and seated (seated preferred)      Shoulder Exercises: Standing   Other Standing Exercises rolling green pball on table (like table slide) x 10 reps, then horiz abdct/add x 5 each way.  Reverse wall push ups x 10  Shoulder Exercises: Stretch   Other Shoulder Stretches Rt ant shoulder stretch holding counter and stepping forward x 20 sec x 2 reps     Other Shoulder Stretches seated shoulder flexion with arms on swiss ball x 10      Modalities   Modalities --   deferred; will use TENS at home.                       PT Long Term Goals - 05/07/20 1335      PT LONG TERM GOAL #1   Title The patient will be indep with HEP.    Time 6    Period Weeks    Status Revised    Target Date 06/18/20      PT LONG TERM GOAL #2   Title The patient will reduce functional limitation per FOTO from 65% to < or equal to 47%.    Time 6    Period Weeks    Status Revised    Target Date 06/18/20      PT LONG TERM GOAL #3   Title The patient will report resting pain < or equal to 3/10.    Time 6    Period Weeks    Status Revised    Target Date 06/18/20      PT LONG TERM GOAL #4   Title The patient will improve grip strength R hand to equal  L.    Time 6    Period Weeks    Status Revised    Target Date 06/18/20                 Plan - 05/19/20 1322    Clinical Impression Statement Pt presenting with less overall pain in back and shoulder today.  Persistant pain and numbness in Rt hand; agrivated with certain neck/UE positions. Pt tolerated all exercises well, with mild increase in Rt hand symptoms but all within tolerance. Add 3 exercises to HEP; may add quadruped if tolerated well. Pt making gradual progress towards HEP.    PT Frequency 2x / week    PT Duration 6 weeks    PT Treatment/Interventions ADLs/Self Care Home Management;Patient/family education;Therapeutic activities;Therapeutic exercise;Electrical Stimulation;Traction;Moist Heat;Cryotherapy;Taping;Dry needling;Manual techniques;Iontophoresis 18m/ml Dexamethasone    PT Next Visit Plan Progress HEP for cspine and shoulder; postural strengthening to tolerance. Modalities as needed for pain    PT Home Exercise Plan Access Code: [email or web address]   Consulted and Agree with Plan of Care Patient           Patient will benefit from skilled therapeutic intervention in order to improve the following deficits and impairments:  Pain, Decreased range of motion, Decreased strength, Impaired flexibility, Hypomobility, Impaired sensation, Postural dysfunction, Decreased activity tolerance  Visit Diagnosis: Cervicalgia  Acute pain of right shoulder  Muscle weakness (generalized)  Other symptoms and signs involving the musculoskeletal system  Pain in right hand     Problem List Patient Active Problem List   Diagnosis Date Noted  . SBO (small bowel obstruction) ([place] 04/29/2020  . HLD (hyperlipidemia) 04/29/2020  . Type 2 diabetes mellitus without complication, without long-term current use of insulin ([place] 04/24/2020  . Acute cystitis without hematuria 06/24/2019  . Tenosynovitis, de Quervain 06/24/2019  . Primary osteoarthritis of first carpometacarpal joint of right  hand 03/18/2019  . Chronic right shoulder pain 03/18/2019  . Bilateral foot pain 03/18/2019  . Primary osteoarthritis of both knees 03/18/2019  . Fibromyalgia 03/15/2019  . Lumbar degenerative disc  disease 12/19/2018  . Myalgia 11/27/2018  . Morning stiffness of joints 11/27/2018  . DDD (degenerative disc disease), cervical 11/27/2018  . Swelling of left upper eyelid 09/24/2018  . Attention deficit hyperactivity disorder (ADHD), predominantly inattentive type 02/27/2018  . Arthralgia 02/27/2018  . Screening for malignant neoplasm of colon 02/27/2018  . H/O: hysterectomy 02/27/2018  . History of prediabetes 02/27/2018  . Hypothyroidism 02/27/2018  . NASH (nonalcoholic steatohepatitis) 02/27/2018  . GAD (generalized anxiety disorder) 01/06/2017  . Moderate episode of recurrent major depressive disorder ([place]) 01/06/2017  . Environmental allergies 09/12/2015  . Essential hypertension 09/12/2015  . OSA on CPAP 09/12/2015   Kerin Perna, PTA 05/19/20 1:25 PM  [facility] [place], [place], [address] Phone: [phone]   Fax:  [phone]  Name: Emerson Barretto MRN: [id number] Date of Birth: 03/02/1966

## 2020-05-22 ENCOUNTER — Other Ambulatory Visit: Payer: Self-pay | Admitting: Medical-Surgical

## 2020-05-22 ENCOUNTER — Encounter: Payer: Self-pay | Admitting: Medical-Surgical

## 2020-05-22 ENCOUNTER — Other Ambulatory Visit: Payer: Self-pay

## 2020-05-22 ENCOUNTER — Encounter: Payer: Self-pay | Admitting: Rehabilitative and Restorative Service Providers"

## 2020-05-22 ENCOUNTER — Ambulatory Visit (INDEPENDENT_AMBULATORY_CARE_PROVIDER_SITE_OTHER): Payer: No Typology Code available for payment source | Admitting: Rehabilitative and Restorative Service Providers"

## 2020-05-22 DIAGNOSIS — F331 Major depressive disorder, recurrent, moderate: Secondary | ICD-10-CM

## 2020-05-22 DIAGNOSIS — M5030 Other cervical disc degeneration, unspecified cervical region: Secondary | ICD-10-CM

## 2020-05-22 DIAGNOSIS — M542 Cervicalgia: Secondary | ICD-10-CM

## 2020-05-22 DIAGNOSIS — M25511 Pain in right shoulder: Secondary | ICD-10-CM | POA: Diagnosis not present

## 2020-05-22 DIAGNOSIS — F900 Attention-deficit hyperactivity disorder, predominantly inattentive type: Secondary | ICD-10-CM

## 2020-05-22 DIAGNOSIS — Z9109 Other allergy status, other than to drugs and biological substances: Secondary | ICD-10-CM

## 2020-05-22 MED ORDER — BUPROPION HCL ER (XL) 150 MG PO TB24: 300.0000 mg | ORAL_TABLET | Freq: Every day | ORAL | 0 refills | Status: DC

## 2020-05-22 MED ORDER — IPRATROPIUM BROMIDE 0.03 % NA SOLN: 2.0000 | Freq: Two times a day (BID) | NASAL | 2 refills | Status: DC

## 2020-05-22 MED ORDER — OLOPATADINE HCL 0.2 % OP SOLN: 1.0000 [drp] | Freq: Every day | OPHTHALMIC | 2 refills | Status: DC

## 2020-05-22 MED FILL — CELECOXIB 200 MG CAPS: 200 | 90 days supply | Qty: 180 | Fill #0

## 2020-05-22 MED FILL — OLOPATADINE HCL 0.2 % SOLN: 0.2 | 25 days supply | Qty: 3 | Fill #0

## 2020-05-22 MED FILL — IPRATROPIUM 0.03% SPRAY: 0.03 | 44 days supply | Qty: 30 | Fill #0

## 2020-05-22 MED FILL — VYVANSE 50 MG CAPSULE: 50 | 30 days supply | Qty: 30 | Fill #0

## 2020-05-22 MED FILL — PREGABALIN 50 MG CAPS: 50 | 30 days supply | Qty: 90 | Fill #0

## 2020-05-22 MED FILL — buPROPion HCL ER (XL) 150 M: 150 | 90 days supply | Qty: 180 | Fill #0

## 2020-05-22 NOTE — Patient Instructions (Signed)
Access Code: [email or web address] URL: [email or web address] Date: 05/22/2020 Prepared by: Rudell Cobb  Exercises Hooklying Shoulder T - 2 x daily - 7 x weekly - 1 sets - 5 reps Supine Chest Stretch with Elbows Bent - 2 x daily - 7 x weekly - 1 sets - 3 reps - 10-15 seconds hold Sidelying Thoracic Rotation with Open Book - 2 x daily - 7 x weekly - 1 sets - 10 reps Seated Cervical Rotation AROM - 2 x daily - 7 x weekly - 1 sets - 10 reps Standing Anatomical Position with Scapular Retraction and Depression at Wall - 2 x daily - 7 x weekly - 1 sets - 10 reps Standing Shoulder Extension with Dowel - 2 x daily - 7 x weekly - 1 sets - 10 reps Radial Nerve Tensioner - 2 x daily - 7 x weekly - 1 sets - 5 reps Standing Median Nerve Glide - 2 x daily - 7 x weekly - 1 sets - 5 reps Prone Alternating Shoulder Flexion on Swiss Ball - 1 x daily - 7 x weekly - 1 sets - 5 reps Seated Thoracic Lumbar Extension with Pectoralis Stretch - 1 x daily - 7 x weekly - 1 sets - 3 reps Thoracic Extension Mobilization on Foam Roll - 1 x daily - 7 x weekly - 1 sets - 3 reps Seated Flexion Stretch with Swiss Ball - 1 x daily - 7 x weekly - 1 sets - 10 reps Seated Wrist Flexion and Extension with Towel Twist - 2 x daily - 7 x weekly - 1 sets - 10 reps

## 2020-05-22 NOTE — Therapy (Signed)
[place], [place], [address] Phone: [phone]   Fax:  [phone]  Physical Therapy Treatment  Patient Details  Name: Melanie [name] MRN: [id number] Date of Birth: January 12, 1966 Referring Provider (PT): Aundria Mems, MD   Encounter Date: 05/22/2020   PT End of Session - 05/22/20 1649    Visit Number 11    Number of Visits 20    Date for PT Re-Evaluation 06/18/20    Authorization Type UMR    PT Start Time 1105    PT Stop Time 1148    PT Time Calculation (min) 43 min    Activity Tolerance Patient tolerated treatment well    Behavior During Therapy [facility] for tasks assessed/performed           Past Medical History:  Diagnosis Date   Bowel obstruction ([place])    Diabetes mellitus without complication ([place])    Hypertension     Past Surgical History:  Procedure Laterality Date   ABDOMINAL HYSTERECTOMY     MYOMECTOMY      There were no vitals filed for this visit.   Subjective Assessment - 05/22/20 1110    Subjective The patient reports her R hand/wrist and upper back (mid t-spine) are two worst spots.  She notes pain with daily activities "uncomfortable most of the time", "zingers" are with pinching, and twisting the hand and writing.    Pertinent History HTN, hypothyroidism, ? fibromyalgia    Diagnostic tests Multilevel degenerative change in the cervical spine as above.Moderate right foraminal encroachment C5-6 and C6-7 due to spurring.Mild left foraminal encroachment C5-6 and C6-7 due to spurring.    Patient Stated Goals "Not be aware of my right arm constantly and improve range of motion."    Currently in Pain? Yes    Pain Score 7     Pain Location Wrist    Pain Orientation Right    Pain Descriptors / Indicators Aching;Numbness;Burning    Pain Type Chronic pain    Pain Onset More than a month ago    Pain Frequency Constant    Multiple Pain Sites Yes    Pain Score 5     Pain Location Back    Pain Descriptors / Indicators Aching;Tightness    Pain Type Acute pain    Pain Onset More than a month ago    Pain Frequency Intermittent    Aggravating Factors  end of day    Pain Relieving Factors rest and heat              [facility] PT Assessment - 05/22/20 1114      Assessment   Medical Diagnosis cervical DDD    Referring Provider (PT) Aundria Mems, MD    Onset Date/Surgical Date 03/26/20    Hand Dominance Right                         [facility] Adult PT Treatment/Exercise - 05/22/20 1114      Exercises   Exercises Shoulder;Neck;Wrist;Hand      Shoulder Exercises: Prone   Flexion Strengthening;Right;Left    Flexion Limitations with quadriped position and ball under abdomen; performed alternating UE reaching    Horizontal ABduction 1 Strengthening;Right;Left;10 reps    Horizontal ABduction 1 Limitations in prone over physioball for support lifting into horiz abduction alternating R And L sides x 10 reps    Other Prone Exercises neck rotation when in prone over physioball  Shoulder Exercises: Standing   Other Standing Exercises standing CW and CCW shoulder circles for wrist mobility    Other Standing Exercises rolling on green physioball anterior into weight shifting into bilat UEs focusing on wrist flexion and extension within tolerable A/ROM in WB position      Hand Exercises   Tendon Glides ulnar and radial gliding    Other Hand Exercises towel wringing for wrist flexion/exension within tolerable ROM      Wrist Exercises   Wrist Flexion AROM;PROM;Right;10 reps    Wrist Extension AROM;Right;10 reps    Other wrist exercises wall leans with R and L UE    Other wrist exercises stnading wrist flexion working on neural gliding      Manual Therapy   Manual Therapy Soft tissue mobilization;Joint mobilization    Manual therapy comments to reduce pain and tightness    Joint Mobilization wrist distraction    Soft tissue  mobilization STM to R forearm     Passive ROM neural gliding R UE abd + ER + elbow extension + neck rotation to left side                  PT Education - 05/22/20 1147    Education Details HEP    Person(s) Educated Patient    Methods Explanation;Demonstration;Handout    Comprehension Returned demonstration;Verbalized understanding               PT Long Term Goals - 05/07/20 1335      PT LONG TERM GOAL #1   Title The patient will be indep with HEP.    Time 6    Period Weeks    Status Revised    Target Date 06/18/20      PT LONG TERM GOAL #2   Title The patient will reduce functional limitation per FOTO from 65% to < or equal to 47%.    Time 6    Period Weeks    Status Revised    Target Date 06/18/20      PT LONG TERM GOAL #3   Title The patient will report resting pain < or equal to 3/10.    Time 6    Period Weeks    Status Revised    Target Date 06/18/20      PT LONG TERM GOAL #4   Title The patient will improve grip strength R hand to equal L.    Time 6    Period Weeks    Status Revised    Target Date 06/18/20                 Plan - 05/22/20 1150    Clinical Impression Statement The patient continues to have improved shoulder range of motion.  She continues to c/o pain R wrist/hand with pinching, squeezing, and turning.  PT addressing deficits working on ROM< neuralglide, and strengthening to tolerance.    PT Treatment/Interventions ADLs/Self Care Home Management;Patient/family education;Therapeutic activities;Therapeutic exercise;Electrical Stimulation;Traction;Moist Heat;Cryotherapy;Taping;Dry needling;Manual techniques;Iontophoresis 69m/ml Dexamethasone    PT Next Visit Plan progress wrist/hand HEP; postural strengthening to tolerance. Modalities as needed for pain    Consulted and Agree with Plan of Care Patient           Patient will benefit from skilled therapeutic intervention in order to improve the following deficits and  impairments:     Visit Diagnosis: Cervicalgia  Acute pain of right shoulder     Problem List Patient Active Problem List   Diagnosis Date Noted  SBO (small bowel obstruction) ([place]) 04/29/2020   HLD (hyperlipidemia) 04/29/2020   Type 2 diabetes mellitus without complication, without long-term current use of insulin ([place]) 04/24/2020   Acute cystitis without hematuria 06/24/2019   Tenosynovitis, de Quervain 06/24/2019   Primary osteoarthritis of first carpometacarpal joint of right hand 03/18/2019   Chronic right shoulder pain 03/18/2019   Bilateral foot pain 03/18/2019   Primary osteoarthritis of both knees 03/18/2019   Fibromyalgia 03/15/2019   Lumbar degenerative disc disease 12/19/2018   Myalgia 11/27/2018   Morning stiffness of joints 11/27/2018   DDD (degenerative disc disease), cervical 11/27/2018   Swelling of left upper eyelid 09/24/2018   Attention deficit hyperactivity disorder (ADHD), predominantly inattentive type 02/27/2018   Arthralgia 02/27/2018   Screening for malignant neoplasm of colon 02/27/2018   H/O: hysterectomy 02/27/2018   History of prediabetes 02/27/2018   Hypothyroidism 02/27/2018   NASH (nonalcoholic steatohepatitis) 02/27/2018   GAD (generalized anxiety disorder) 01/06/2017   Moderate episode of recurrent major depressive disorder ([place]) 01/06/2017   Environmental allergies 09/12/2015   Essential hypertension 09/12/2015   OSA on CPAP 09/12/2015    Melanie [name], PT 05/22/2020, 4:52 PM  [facility] [place] [address] [place], [place], [address] Phone: [phone]   Fax:  [phone]  Name: Lakevia Perris MRN: [id number] Date of Birth: July 01, 1966

## 2020-05-25 ENCOUNTER — Encounter: Payer: No Typology Code available for payment source | Admitting: Rehabilitative and Restorative Service Providers"

## 2020-05-25 ENCOUNTER — Other Ambulatory Visit: Payer: Self-pay

## 2020-05-25 ENCOUNTER — Ambulatory Visit (INDEPENDENT_AMBULATORY_CARE_PROVIDER_SITE_OTHER): Payer: No Typology Code available for payment source | Admitting: Rehabilitative and Restorative Service Providers"

## 2020-05-25 DIAGNOSIS — M79641 Pain in right hand: Secondary | ICD-10-CM

## 2020-05-25 DIAGNOSIS — R29898 Other symptoms and signs involving the musculoskeletal system: Secondary | ICD-10-CM | POA: Diagnosis not present

## 2020-05-25 DIAGNOSIS — M6281 Muscle weakness (generalized): Secondary | ICD-10-CM

## 2020-05-25 NOTE — Therapy (Signed)
[place], [place], [address] Phone: [phone]   Fax:  [phone]  Physical Therapy Treatment  Patient Details  Name: Melanie [name] MRN: [id number] Date of Birth: 04-Jul-1966 Referring Provider (PT): Aundria Mems, MD   Encounter Date: 05/25/2020   PT End of Session - 05/25/20 1300    Visit Number 12    Number of Visits 20    Date for PT Re-Evaluation 06/18/20    Authorization Type UMR    PT Start Time 1200    PT Stop Time 1242    PT Time Calculation (min) 42 min    Activity Tolerance Patient tolerated treatment well    Behavior During Therapy [facility] for tasks assessed/performed           Past Medical History:  Diagnosis Date   Bowel obstruction ([place])    Diabetes mellitus without complication ([place])    Hypertension     Past Surgical History:  Procedure Laterality Date   ABDOMINAL HYSTERECTOMY     MYOMECTOMY      There were no vitals filed for this visit.   Subjective Assessment - 05/25/20 1200    Subjective The patient arrived to PT today noting severe pain in the R hand and wrist.    Pertinent History HTN, hypothyroidism, ? fibromyalgia    Diagnostic tests Multilevel degenerative change in the cervical spine as above.Moderate right foraminal encroachment C5-6 and C6-7 due to spurring.Mild left foraminal encroachment C5-6 and C6-7 due to spurring.    Patient Stated Goals "Not be aware of my right arm constantly and improve range of motion."    Currently in Pain? Yes    Pain Score 10-Worst pain ever    Pain Location Wrist    Pain Orientation Right    Pain Descriptors / Indicators Aching;Burning;Numbness    Pain Type Chronic pain    Pain Onset More than a month ago    Pain Frequency Constant    Aggravating Factors  use of R hand    Pain Relieving Factors unsure    Pain Onset More than a month ago              [facility] PT Assessment - 05/25/20 1300       Assessment   Medical Diagnosis cervical DDD    Referring Provider (PT) Aundria Mems, MD    Onset Date/Surgical Date 03/26/20    Hand Dominance Right                         [facility] Adult PT Treatment/Exercise - 05/25/20 1300      Exercises   Exercises Wrist      Wrist Exercises   Wrist Flexion AROM;Right    Wrist Extension AROM;Right    Other wrist exercises pronation/supination PROM      Modalities   Modalities Iontophoresis      Iontophoresis   Type of Iontophoresis Dexamethasone    Location R wrist/ radial side    Dose 1.0 cc    Time 8 hour patch      Manual Therapy   Manual Therapy Soft tissue mobilization    Manual therapy comments skilled palpation to assess soft tissue with DN and  STM    Soft tissue mobilization STM to R forearm extensors, flexors, and elbow            Trigger Point Dry Needling - 05/25/20 1300    Consent Given? Yes  Education Handout Provided Previously provided    Muscles Treated Wrist/Hand Flexor pollicis brevis;Extensor carpi radialis longus/brevis;Extensor digitorum   adductor pollicit   Dry Needling Comments right side    Extensor carpi radialis longus/brevis Response Palpable increased muscle length    Extensor digitorum Response Palpable increased muscle length    Flexor pollicis brevis Response Palpable increased muscle length                PT Education - 05/25/20 1242    Education Details iontophoresis handout               PT Long Term Goals - 05/07/20 1335      PT LONG TERM GOAL #1   Title The patient will be indep with HEP.    Time 6    Period Weeks    Status Revised    Target Date 06/18/20      PT LONG TERM GOAL #2   Title The patient will reduce functional limitation per FOTO from 65% to < or equal to 47%.    Time 6    Period Weeks    Status Revised    Target Date 06/18/20      PT LONG TERM GOAL #3   Title The patient will report resting pain < or equal to 3/10.    Time 6     Period Weeks    Status Revised    Target Date 06/18/20      PT LONG TERM GOAL #4   Title The patient will improve grip strength R hand to equal L.    Time 6    Period Weeks    Status Revised    Target Date 06/18/20                 Plan - 05/25/20 1243    Clinical Impression Statement Pain reduced from 10/10 to a 6/10 with therapy "maybe even better than a 6."  Patient reports feeling a lot better after DN and STM.  PT recommended continued participation in HEP to toleance. Patient undergoing neck injection on Wednesday.    Comorbidities HTN, hypothyroidism    PT Treatment/Interventions ADLs/Self Care Home Management;Patient/family education;Therapeutic activities;Therapeutic exercise;Electrical Stimulation;Traction;Moist Heat;Cryotherapy;Taping;Dry needling;Manual techniques;Iontophoresis 30m/ml Dexamethasone    PT Next Visit Plan progress wrist/hand HEP; postural strengthening to tolerance. Modalities as needed for pain    PT Home Exercise Plan Access Code: [email or web address]   Consulted and Agree with Plan of Care Patient           Patient will benefit from skilled therapeutic intervention in order to improve the following deficits and impairments:     Visit Diagnosis: Muscle weakness (generalized)  Other symptoms and signs involving the musculoskeletal system  Pain in right hand     Problem List Patient Active Problem List   Diagnosis Date Noted   SBO (small bowel obstruction) ([place] 04/29/2020   HLD (hyperlipidemia) 04/29/2020   Type 2 diabetes mellitus without complication, without long-term current use of insulin ([place] 04/24/2020   Acute cystitis without hematuria 06/24/2019   Tenosynovitis, de Quervain 06/24/2019   Primary osteoarthritis of first carpometacarpal joint of right hand 03/18/2019   Chronic right shoulder pain 03/18/2019   Bilateral foot pain 03/18/2019   Primary osteoarthritis of both knees 03/18/2019   Fibromyalgia 03/15/2019   Lumbar  degenerative disc disease 12/19/2018   Myalgia 11/27/2018   Morning stiffness of joints 11/27/2018   DDD (degenerative disc disease), cervical 11/27/2018   Swelling of left upper eyelid  09/24/2018   Attention deficit hyperactivity disorder (ADHD), predominantly inattentive type 02/27/2018   Arthralgia 02/27/2018   Screening for malignant neoplasm of colon 02/27/2018   H/O: hysterectomy 02/27/2018   History of prediabetes 02/27/2018   Hypothyroidism 02/27/2018   NASH (nonalcoholic steatohepatitis) 02/27/2018   GAD (generalized anxiety disorder) 01/06/2017   Moderate episode of recurrent major depressive disorder ([place]) 01/06/2017   Environmental allergies 09/12/2015   Essential hypertension 09/12/2015   OSA on CPAP 09/12/2015    Jamarian Jacinto, PT 05/25/2020, 8:26 PM  [facility] [place], [place], [address] Phone: [phone]   Fax:  [phone]  Name: Rhiley Tarver MRN: [id number] Date of Birth: 22-Apr-1966

## 2020-05-25 NOTE — Patient Instructions (Signed)
IONTOPHORESIS PATIENT PRECAUTIONS & CONTRAINDICATIONS   Redness under one or both electrodes can occur.  This is characterized by a uniform redness that usually disappears within 12 hours of treatment.  Small pinhead size blisters may result in response to the drug.  Contact your physician if the problem persists more than 24 hours.  On rare occasions, iontophoresis therapy can result in temporary skin reactions such as rash, inflammation, irritation or burns.  The skin reaction bay be the result of individual sensitivity to the ionic solution used, the condition of the skin at the start of treatment, reaction to the materials in the electrodes, allergies or sensitivity to dexamethasone, or a poor connection between the patch and your skin.  Discontinue using iontophoresis if you have any of these reactions and report to your therapist.  Remove the Patch or electrodes if you have any undue sensation of pain or burning during the treatment and report discomfort to your therapist.  Tell your Therapist if you have had known adverse reactions to the application of electrical current.  If using the Patch, the LED light will turn off when treatment is complete and the patch can be removed.  Approximate treatment time is 1-3 hours.  Remove the patch when light goes off or after 6 hours.  The Patch can be worn during normal activity, however excessive motion where the electrodes have been placed can cause poor contact between the skin and the electrode or uneven electrical current resulting in greater risk of skin irritation.  Keep out of the reach of children.   DO NOT use if you have a cardiac pacemaker or any other electrically sensitive  Implanted device.  DO NOT use if you have known sensitivity to Dexamethasone.  DO NOT use during Magnetic Resonance Imaging (MRI).  DO NOT use over broken or compromised skin (e.g. Sunburn, cuts or acne) due to the increased risk of skin reaction.  DO NOT  SHAVE over the area to be treated:  To establish good contact between the Patch and the skin excessive hair may be clipped.  DO NOT place the Patch or electrodes on or over your eyes, directly over your heart or brain.  DO NOT reuse the Patch or electrodes as this may cause burns to occur.

## 2020-05-26 ENCOUNTER — Other Ambulatory Visit: Payer: Self-pay

## 2020-05-26 ENCOUNTER — Encounter: Payer: Self-pay | Admitting: Gastroenterology

## 2020-05-26 ENCOUNTER — Encounter: Payer: Self-pay | Admitting: Medical-Surgical

## 2020-05-26 DIAGNOSIS — G4733 Obstructive sleep apnea (adult) (pediatric): Secondary | ICD-10-CM

## 2020-05-27 ENCOUNTER — Ambulatory Visit
Admission: RE | Admit: 2020-05-27 | Discharge: 2020-05-27 | Disposition: A | Discharge status: Home / Self Care | Payer: No Typology Code available for payment source | Source: Ambulatory Visit | Attending: Sports Medicine | Admitting: Sports Medicine

## 2020-05-27 ENCOUNTER — Other Ambulatory Visit: Payer: Self-pay

## 2020-05-27 DIAGNOSIS — M5030 Other cervical disc degeneration, unspecified cervical region: Secondary | ICD-10-CM

## 2020-05-27 MED ORDER — TRIAMCINOLONE ACETONIDE 40 MG/ML IJ SUSP (RADIOLOGY)
60.0000 mg | Freq: Once | INTRAMUSCULAR | Status: AC
  Administered 2020-05-27: 60 mg via EPIDURAL

## 2020-05-27 MED ORDER — IOPAMIDOL (ISOVUE-M 300) INJECTION 61%
1.0000 mL | Freq: Once | INTRAMUSCULAR | Status: AC | PRN
  Administered 2020-05-27: 1 mL via EPIDURAL

## 2020-05-27 NOTE — Discharge Instructions (Signed)

## 2020-06-01 ENCOUNTER — Ambulatory Visit (INDEPENDENT_AMBULATORY_CARE_PROVIDER_SITE_OTHER): Payer: No Typology Code available for payment source | Admitting: Rehabilitative and Restorative Service Providers"

## 2020-06-01 ENCOUNTER — Other Ambulatory Visit: Payer: Self-pay

## 2020-06-01 ENCOUNTER — Other Ambulatory Visit: Payer: Self-pay | Admitting: *Deleted

## 2020-06-01 DIAGNOSIS — M542 Cervicalgia: Secondary | ICD-10-CM

## 2020-06-01 DIAGNOSIS — M79641 Pain in right hand: Secondary | ICD-10-CM | POA: Diagnosis not present

## 2020-06-01 DIAGNOSIS — M6281 Muscle weakness (generalized): Secondary | ICD-10-CM | POA: Diagnosis not present

## 2020-06-01 DIAGNOSIS — R29898 Other symptoms and signs involving the musculoskeletal system: Secondary | ICD-10-CM | POA: Diagnosis not present

## 2020-06-01 NOTE — Patient Instructions (Signed)
Access Code: [email or web address] URL: [email or web address] Date: 06/01/2020 Prepared by: Rudell Cobb  Exercises Supine Thoracic Mobilization Towel Roll Vertical with Arm Stretch - 2 x daily - 7 x weekly - 1 sets - 10 reps Thoracic Extension Mobilization on Foam Roll - 1 x daily - 7 x weekly - 1 sets - 3 reps - 20 seconds hold Supine Chin Tuck - 2 x daily - 7 x weekly - 1 sets - 10 reps Sidelying Thoracic Rotation with Open Book - 2 x daily - 7 x weekly - 1 sets - 10 reps Standing Anatomical Position with Scapular Retraction and Depression at Wall - 2 x daily - 7 x weekly - 1 sets - 10 reps Standing Shoulder Extension with Dowel - 2 x daily - 7 x weekly - 1 sets - 10 reps Standing Median Nerve Glide - 2 x daily - 7 x weekly - 1 sets - 5 reps Seated Thoracic Extension and Rotation with Reach - 2 x daily - 7 x weekly - 1 sets - 3-5 reps Prone Alternating Shoulder Flexion on Swiss Ball - 1 x daily - 7 x weekly - 1 sets - 5 reps Seated Wrist Flexion and Extension with Towel Twist - 2 x daily - 7 x weekly - 1 sets - 5 reps Seated Wrist Prayer Stretch - 2 x daily - 7 x weekly - 1 sets - 3 reps - 30seconds hold

## 2020-06-01 NOTE — Therapy (Signed)
[place], [place], [address] Phone: [phone]   Fax:  [phone]  Physical Therapy Treatment  Patient Details  Name: Melanie [name] MRN: [id number] Date of Birth: [birth year] Referring Provider (PT): Aundria Mems, MD   Encounter Date: 06/01/2020   PT End of Session - 06/01/20 1126    Visit Number 13    Number of Visits 20    Date for PT Re-Evaluation 06/18/20    Authorization Type UMR    PT Start Time 1105    PT Stop Time 1145    PT Time Calculation (min) 40 min    Activity Tolerance Patient tolerated treatment well    Behavior During Therapy [facility] for tasks assessed/performed           Past Medical History:  Diagnosis Date   Bowel obstruction ([place])    Diabetes mellitus without complication ([place])    Hypertension     Past Surgical History:  Procedure Laterality Date   ABDOMINAL HYSTERECTOMY     MYOMECTOMY      There were no vitals filed for this visit.   Subjective Assessment - 06/01/20 1106    Subjective The patient felt that ionto improved wrist pain.  She had injection on Wednesday.  She feels some improvement since the injection.  The heaviness in her hand has reduced with the injection.  The pain between shoulder blades has also improved.    Pertinent History HTN, hypothyroidism, ? fibromyalgia    Diagnostic tests Multilevel degenerative change in the cervical spine as above.Moderate right foraminal encroachment C5-6 and C6-7 due to spurring.Mild left foraminal encroachment C5-6 and C6-7 due to spurring.    Patient Stated Goals "Not be aware of my right arm constantly and improve range of motion."    Currently in Pain? Yes    Pain Score 4     Pain Location Wrist    Pain Orientation Right    Pain Descriptors / Indicators Numbness    Pain Type Chronic pain    Pain Onset More than a month ago    Pain Frequency Constant    Aggravating Factors  driving, using  hand    Pain Relieving Factors injection              [facility] PT Assessment - 06/01/20 1126      Assessment   Medical Diagnosis cervical DDD    Referring Provider (PT) Aundria Mems, MD    Onset Date/Surgical Date 03/26/20    Hand Dominance Right                         [facility] Adult PT Treatment/Exercise - 06/01/20 1126      Exercises   Exercises Neck      Neck Exercises: Seated   Other Seated Exercise UE extension with thoracic rotation *added to HEP      Neck Exercises: Supine   Neck Retraction 10 reps    Neck Retraction Limitations with cues using towel roll    Other Supine Exercise thoracic extension over 1/2 foam roll    Other Supine Exercise supine T and elbows behind head-- can irritate shoulder so modified to be supine horizontal abduction x 10 reps      Neck Exercises: Sidelying   Other Sidelying Exercise sidelying open book exercise x 10 reps R and L with cues to allow head to follow hand      Wrist Exercises   Wrist Flexion  AROM;Right    Wrist Extension AROM;Right    Wrist Extension Limitations worked on prayer stretch and passive stretch into extension      Modalities   Modalities Iontophoresis      Iontophoresis   Type of Iontophoresis Dexamethasone    Location R wrist/ radial side    Dose 1.0 cc    Time 8 hour patch                  PT Education - 06/01/20 1110    Education Details updated HEP    Person(s) Educated Patient    Methods Explanation;Demonstration;Handout    Comprehension Verbalized understanding;Returned demonstration               PT Long Term Goals - 05/07/20 1335      PT LONG TERM GOAL #1   Title The patient will be indep with HEP.    Time 6    Period Weeks    Status Revised    Target Date 06/18/20      PT LONG TERM GOAL #2   Title The patient will reduce functional limitation per FOTO from 65% to < or equal to 47%.    Time 6    Period Weeks    Status Revised    Target Date 06/18/20       PT LONG TERM GOAL #3   Title The patient will report resting pain < or equal to 3/10.    Time 6    Period Weeks    Status Revised    Target Date 06/18/20      PT LONG TERM GOAL #4   Title The patient will improve grip strength R hand to equal L.    Time 6    Period Weeks    Status Revised    Target Date 06/18/20                 Plan - 06/01/20 1144    Clinical Impression Statement The patient notes improvements related to neck injection last week.  She has reduced heaviness in R UE.  She continues with tightness in wrist flexors and some point tender pain in wrist-- treated with iontophoresis.  MOdified HEP for continued performance at home.    PT Treatment/Interventions ADLs/Self Care Home Management;Patient/family education;Therapeutic activities;Therapeutic exercise;Electrical Stimulation;Traction;Moist Heat;Cryotherapy;Taping;Dry needling;Manual techniques;Iontophoresis 107m/ml Dexamethasone    PT Next Visit Plan progress wrist/hand HEP; postural strengthening to tolerance. Modalities as needed for pain    PT Home Exercise Plan Access Code: [email or web address]   Consulted and Agree with Plan of Care Patient           Patient will benefit from skilled therapeutic intervention in order to improve the following deficits and impairments:     Visit Diagnosis: Muscle weakness (generalized)  Other symptoms and signs involving the musculoskeletal system  Pain in right hand  Cervicalgia     Problem List Patient Active Problem List   Diagnosis Date Noted   SBO (small bowel obstruction) ([place] 04/29/2020   HLD (hyperlipidemia) 04/29/2020   Type 2 diabetes mellitus without complication, without long-term current use of insulin ([facility] 04/24/2020   Acute cystitis without hematuria 06/24/2019   Tenosynovitis, de Quervain 06/24/2019   Primary osteoarthritis of first carpometacarpal joint of right hand 03/18/2019   Chronic right shoulder pain 03/18/2019   Bilateral foot  pain 03/18/2019   Primary osteoarthritis of both knees 03/18/2019   Fibromyalgia 03/15/2019   Lumbar degenerative disc disease 12/19/2018   Myalgia 11/27/2018  Morning stiffness of joints 11/27/2018   DDD (degenerative disc disease), cervical 11/27/2018   Swelling of left upper eyelid 09/24/2018   Attention deficit hyperactivity disorder (ADHD), predominantly inattentive type 02/27/2018   Arthralgia 02/27/2018   Screening for malignant neoplasm of colon 02/27/2018   H/O: hysterectomy 02/27/2018   History of prediabetes 02/27/2018   Hypothyroidism 02/27/2018   NASH (nonalcoholic steatohepatitis) 02/27/2018   GAD (generalized anxiety disorder) 01/06/2017   Moderate episode of recurrent major depressive disorder ([place]) 01/06/2017   Environmental allergies 09/12/2015   Essential hypertension 09/12/2015   OSA on CPAP 09/12/2015    Jaaziah Schulke, PT 06/01/2020, 12:50 PM  [facility] [place] [address] [place], [place], [address] Phone: [phone]   Fax:  [phone]  Name: Melanie [name] MRN: [id number] Date of Birth: 12-12-1965

## 2020-06-01 NOTE — Patient Outreach (Signed)
[facility]) Care Management  06/01/2020  Melanie [name] 15-Nov-1965 [id number]   Transition of care call Referral received: 04/30/20 Initial outreach attempt: 05/01/20 Insurance: [employer]  unsuccessful telephone call to patient's preferred contact number in order to complete post hospital discharge transition of care assessment; no answer, left HIPAA compliant message requesting return call.   Objective: Per the electronic medical record,Melanie [name] was hospitalized at [facility]  From 10/26-10/28/21  For Small bowel Obstruction  . Comorbidities include:Hypertension , Diabetes, Hyperlipidemia, OSA on CPAP.  She was discharged to home on 04/30/20 without the need for home health services or durable medical equipment per the discharge summary.    Plan: If no return call from patient, will close case to [place] Management services, unsuccessful outreach calls x 4, no response from outreach letter.   Joylene Draft, RN, BSN  [place] Management Coordinator  [phone]- Mobile [phone]- Toll Free Main Office .

## 2020-06-05 ENCOUNTER — Other Ambulatory Visit: Payer: Self-pay

## 2020-06-05 ENCOUNTER — Ambulatory Visit: Payer: No Typology Code available for payment source | Admitting: Rehabilitative and Restorative Service Providers"

## 2020-06-05 DIAGNOSIS — M79641 Pain in right hand: Secondary | ICD-10-CM

## 2020-06-05 DIAGNOSIS — R29898 Other symptoms and signs involving the musculoskeletal system: Secondary | ICD-10-CM

## 2020-06-05 DIAGNOSIS — M542 Cervicalgia: Secondary | ICD-10-CM

## 2020-06-05 DIAGNOSIS — M6281 Muscle weakness (generalized): Secondary | ICD-10-CM

## 2020-06-05 NOTE — Therapy (Signed)
[place], [place], [address] Phone: [phone]   Fax:  [phone]  Physical Therapy Treatment  Patient Details  Name: Melanie [name] MRN: [id number] Date of Birth: 1965/07/19 Referring Provider (PT): Aundria Mems, MD   Encounter Date: 06/05/2020   PT End of Session - 06/05/20 1129    Visit Number 14    Number of Visits 20    Date for PT Re-Evaluation 06/18/20    Authorization Type UMR    PT Start Time 1108    PT Stop Time 1154    PT Time Calculation (min) 46 min    Activity Tolerance Patient tolerated treatment well    Behavior During Therapy [facility] for tasks assessed/performed           Past Medical History:  Diagnosis Date   Bowel obstruction ([place])    Diabetes mellitus without complication ([place])    Hypertension     Past Surgical History:  Procedure Laterality Date   ABDOMINAL HYSTERECTOMY     MYOMECTOMY      There were no vitals filed for this visit.   Subjective Assessment - 06/05/20 1109    Subjective The patient is doing better with the spot in her mid back.  The intensity of numbness has dec'd since the injection.  She still drops items during the day (thinks she has them and then drops them).  She continues with hand tightness.  Her neck feels improved with motion.  She is still getting occasional low back pain.  Gets some improvement in wrist motion after iontophoresis.    Pertinent History HTN, hypothyroidism, ? fibromyalgia    Patient Stated Goals "Not be aware of my right arm constantly and improve range of motion."    Currently in Pain? Yes    Pain Score 4     Pain Location Wrist    Pain Orientation Right    Pain Descriptors / Indicators Sore;Heaviness;Numbness   heaving/numbness in hand   Pain Type Chronic pain    Pain Onset More than a month ago    Pain Frequency Constant    Aggravating Factors  driving; using hand; sleep    Pain Relieving Factors  injection helped    Pain Score 0    Pain Location Scapula    Pain Orientation Right              [facility] PT Assessment - 06/05/20 1117      Assessment   Medical Diagnosis cervical DDD    Referring Provider (PT) Aundria Mems, MD    Onset Date/Surgical Date 03/26/20    Hand Dominance Right      Strength   Right Hand Grip (lbs) 50    Left Hand Grip (lbs) 80                         [facility] Adult PT Treatment/Exercise - 06/05/20 1117      Exercises   Exercises Shoulder;Wrist      Lumbar Exercises: Quadruped   Single Arm Raise Right;Left;3 seconds    Single Arm Raises Limitations right and left    Other Quadruped Lumbar Exercises quadriped R UE behind head + rotation for spinal stretch/ thoracid spine      Shoulder Exercises: Standing   Other Standing Exercises wall lean with scapular protraction/retraction for wrist WB x 8 reps      Shoulder Exercises: Stretch   Corner Stretch 2 reps;30 seconds  Corner Stretch Limitations with arms in low V and high V      Wrist Exercises   Wrist Flexion AAROM;Right;10 reps;AROM    Theraband Level (Wrist Flexion) Level 1 (Yellow)   pain with resistance activities, therefore did A/ROM   Wrist Flexion Limitations gently stretching over ball in q-ped    Wrist Extension Right;AAROM;AROM    Theraband Level (Wrist Extension) Level 1 (Yellow)   pain with resistance, so did A/ROM   Wrist Extension Limitations over ball    Other wrist exercises quadriped over physioball with ant/posterior rocking    Other wrist exercises lateral weight shifting over physioball through wrists into wrist extension      Modalities   Modalities Iontophoresis      Iontophoresis   Type of Iontophoresis Dexamethasone    Location R wrist/ radial side    Dose 1.0 cc    Time 8 hour patch                  PT Education - 06/05/20 1144    Education Details HEP    Person(s) Educated Patient    Methods Explanation;Demonstration;Handout      Comprehension Verbalized understanding;Returned demonstration               PT Long Term Goals - 06/05/20 1131      PT LONG TERM GOAL #1   Title The patient will be indep with HEP.    Time 6    Period Weeks    Status On-going    Target Date 06/18/20      PT LONG TERM GOAL #2   Title The patient will reduce functional limitation per FOTO from 65% to < or equal to 47%.    Baseline due to no longer focusing on neck/shoulder; PT has also worked on wrist/hand.  Too many varying regions to capture accurate FOTO score    Time 6    Period Weeks    Status Deferred      PT LONG TERM GOAL #3   Title The patient will report resting pain < or equal to 3/10.    Time 6    Period Weeks    Status On-going      PT LONG TERM GOAL #4   Title The patient will improve grip strength R hand to equal L.    Baseline 50 lbs R and 80 lbs L    Time 6    Period Weeks    Status On-going                 Plan - 06/05/20 1600    Clinical Impression Statement The patient is continuing to note some benefit after injection last week.  PT to continue working to [employer] focusing on skills for return to work.  We discussed reduction to 1x/week to continue to progress HEP. Our focus in PT has changed from shoulder to wrist/hand.    PT Treatment/Interventions ADLs/Self Care Home Management;Patient/family education;Therapeutic activities;Therapeutic exercise;Electrical Stimulation;Traction;Moist Heat;Cryotherapy;Taping;Dry needling;Manual techniques;Iontophoresis 40m/ml Dexamethasone    PT Next Visit Plan progress wrist/hand HEP; postural strengthening to tolerance. Modalities as needed for pain    PT Home Exercise Plan Access Code: [email or web address]   Consulted and Agree with Plan of Care Patient           Patient will benefit from skilled therapeutic intervention in order to improve the following deficits and impairments:  Pain, Decreased range of motion, Decreased strength, Impaired flexibility,  Hypomobility, Impaired sensation, Postural dysfunction, Decreased  activity tolerance  Visit Diagnosis: Muscle weakness (generalized)  Other symptoms and signs involving the musculoskeletal system  Pain in right hand  Cervicalgia     Problem List Patient Active Problem List   Diagnosis Date Noted   SBO (small bowel obstruction) ([place]) 04/29/2020   HLD (hyperlipidemia) 04/29/2020   Type 2 diabetes mellitus without complication, without long-term current use of insulin ([place]) 04/24/2020   Acute cystitis without hematuria 06/24/2019   Tenosynovitis, de Quervain 06/24/2019   Primary osteoarthritis of first carpometacarpal joint of right hand 03/18/2019   Chronic right shoulder pain 03/18/2019   Bilateral foot pain 03/18/2019   Primary osteoarthritis of both knees 03/18/2019   Fibromyalgia 03/15/2019   Lumbar degenerative disc disease 12/19/2018   Myalgia 11/27/2018   Morning stiffness of joints 11/27/2018   DDD (degenerative disc disease), cervical 11/27/2018   Swelling of left upper eyelid 09/24/2018   Attention deficit hyperactivity disorder (ADHD), predominantly inattentive type 02/27/2018   Arthralgia 02/27/2018   Screening for malignant neoplasm of colon 02/27/2018   H/O: hysterectomy 02/27/2018   History of prediabetes 02/27/2018   Hypothyroidism 02/27/2018   NASH (nonalcoholic steatohepatitis) 02/27/2018   GAD (generalized anxiety disorder) 01/06/2017   Moderate episode of recurrent major depressive disorder ([place]) 01/06/2017   Environmental allergies 09/12/2015   Essential hypertension 09/12/2015   OSA on CPAP 09/12/2015    Richards Pherigo, PT 06/05/2020, 4:01 PM  [facility] [place], [place], [address] Phone: [phone]   Fax:  [phone]  Name: Melanie [name] MRN: [id number] Date of Birth: [birth year]

## 2020-06-05 NOTE — Patient Instructions (Signed)
Access Code: [email or web address] URL: [email or web address] Date: 06/05/2020 Prepared by: Rudell Cobb  Exercises Supine Thoracic Mobilization Towel Roll Vertical with Arm Stretch - 2 x daily - 7 x weekly - 1 sets - 10 reps Thoracic Extension Mobilization on Foam Roll - 1 x daily - 7 x weekly - 1 sets - 3 reps - 20 seconds hold Supine Chin Tuck - 2 x daily - 7 x weekly - 1 sets - 10 reps Sidelying Thoracic Rotation with Open Book - 2 x daily - 7 x weekly - 1 sets - 10 reps Standing Anatomical Position with Scapular Retraction and Depression at [place] - 2 x daily - 7 x weekly - 1 sets - 10 reps Standing Shoulder Extension with Dowel - 2 x daily - 7 x weekly - 1 sets - 10 reps Standing Median Nerve Glide - 2 x daily - 7 x weekly - 1 sets - 5 reps Seated Thoracic Extension and Rotation with Reach - 2 x daily - 7 x weekly - 1 sets - 3-5 reps Prone Alternating Shoulder Flexion on Swiss Ball - 1 x daily - 7 x weekly - 1 sets - 5 reps Seated Wrist Flexion and Extension with Towel Twist - 2 x daily - 7 x weekly - 1 sets - 5 reps Seated Wrist Prayer Stretch - 2 x daily - 7 x weekly - 1 sets - 3 reps - 30seconds hold Seated Forearm Pronation and Supination AROM - 2 x daily - 7 x weekly - 1 sets - 10 reps Wrist AROM Flexion Extension - 2 x daily - 7 x weekly - 1 sets - 10 reps

## 2020-06-09 ENCOUNTER — Other Ambulatory Visit: Payer: Self-pay | Admitting: Medical-Surgical

## 2020-06-09 DIAGNOSIS — Z1231 Encounter for screening mammogram for malignant neoplasm of breast: Secondary | ICD-10-CM

## 2020-06-10 ENCOUNTER — Encounter: Payer: Self-pay | Admitting: Rehabilitative and Restorative Service Providers"

## 2020-06-10 ENCOUNTER — Other Ambulatory Visit: Payer: Self-pay

## 2020-06-10 ENCOUNTER — Encounter: Payer: No Typology Code available for payment source | Admitting: Rehabilitative and Restorative Service Providers"

## 2020-06-12 ENCOUNTER — Encounter: Payer: No Typology Code available for payment source | Admitting: Rehabilitative and Restorative Service Providers"

## 2020-06-12 ENCOUNTER — Other Ambulatory Visit: Payer: Self-pay

## 2020-06-12 DIAGNOSIS — M5030 Other cervical disc degeneration, unspecified cervical region: Secondary | ICD-10-CM

## 2020-06-15 ENCOUNTER — Encounter: Payer: Self-pay | Admitting: Medical-Surgical

## 2020-06-15 ENCOUNTER — Telehealth: Payer: No Typology Code available for payment source | Admitting: Sports Medicine

## 2020-06-15 ENCOUNTER — Ambulatory Visit (INDEPENDENT_AMBULATORY_CARE_PROVIDER_SITE_OTHER): Payer: No Typology Code available for payment source | Admitting: Medical-Surgical

## 2020-06-15 VITALS — BP 126/79 | HR 84 | Temp 97.6°F | Ht 67.0 in | Wt 220.5 lb

## 2020-06-15 DIAGNOSIS — R519 Headache, unspecified: Secondary | ICD-10-CM | POA: Diagnosis not present

## 2020-06-15 DIAGNOSIS — F900 Attention-deficit hyperactivity disorder, predominantly inattentive type: Secondary | ICD-10-CM | POA: Diagnosis not present

## 2020-06-15 DIAGNOSIS — E119 Type 2 diabetes mellitus without complications: Secondary | ICD-10-CM | POA: Diagnosis not present

## 2020-06-15 DIAGNOSIS — E782 Mixed hyperlipidemia: Secondary | ICD-10-CM

## 2020-06-15 DIAGNOSIS — F331 Major depressive disorder, recurrent, moderate: Secondary | ICD-10-CM

## 2020-06-15 DIAGNOSIS — I10 Essential (primary) hypertension: Secondary | ICD-10-CM

## 2020-06-15 MED ORDER — TRAMADOL HCL 50 MG PO TABS
50.0000 mg | ORAL_TABLET | Freq: Three times a day (TID) | ORAL | 0 refills | Status: AC | PRN
Start: 2020-06-15 — End: 2020-06-20

## 2020-06-15 NOTE — Progress Notes (Signed)
Subjective:    CC: Facial pain, general follow-up  HPI: Pleasant 54 year old female presenting today for the following:  Facial pain-notes that last week she had a day where she had very dry mouth followed by left jaw that developed the next day.  Was seen by her dentist who found that she does have a couple of cracked teeth but no obvious infection.  Despite this, the dentist put her on Augmentin twice daily which she started Thursday evening and has taken twice daily since.  Unfortunately, her jaw pain has not resolved with this treatment.  She has also had pain under her tongue in the area of the salivary glands, that area feeling swollen at times.  Has used ice a couple of times which does seem to help a little.  ADHD-taking Vyvanse 50 mg daily, tolerating well without side effects.  Wonders at times if this dose is the right one for her or if she needs an increase.  Would like to stay at the 50 mg for right now since there is some the other things going on.  Depression/anxiety-decided to increase her Wellbutrin as previously discussed to 300 mg daily.  She has taken the first dose of 300 mg today, has tolerated well so far with no side effects.  DM-is not checking her blood sugars regularly so not sure what her fasting glucose is running.  Has been taking berberine as previously discussed, tolerating well.  HLD-did not start pravastatin.  Opted to take an herbal remedy called panthetine that she got off [place].  HTN-taking losartan 25 mg daily, tolerating well without side effects.  Blood pressure looks great today although she admits that she has been taking Sudafed for her upper respiratory symptoms for the last couple of days.  I reviewed the past medical history, family history, social history, surgical history, and allergies today and no changes were needed.  Please see the problem list section below in epic for further details.  Past Medical History: Past Medical History:   Diagnosis Date  . Bowel obstruction ([place])   . Diabetes mellitus without complication ([place])   . Hypertension    Past Surgical History: Past Surgical History:  Procedure Laterality Date  . ABDOMINAL HYSTERECTOMY    . MYOMECTOMY     Social History: Social History   Socioeconomic History  . Marital status: Divorced    Spouse name: Not on file  . Number of children: Not on file  . Years of education: Not on file  . Highest education level: Not on file  Occupational History  . Not on file  Tobacco Use  . Smoking status: Never Smoker  . Smokeless tobacco: Never Used  Vaping Use  . Vaping Use: Never used  Substance and Sexual Activity  . Alcohol use: Yes    Comment: Occassionally  . Drug use: Never  . Sexual activity: Not Currently    Birth control/protection: Surgical  Other Topics Concern  . Not on file  Social History Narrative  . Not on file   Social Determinants of Health   Financial Resource Strain: Not on file  Food Insecurity: Not on file  Transportation Needs: Not on file  Physical Activity: Not on file  Stress: Not on file  Social Connections: Not on file   Family History: Family History  Problem Relation Age of Onset  . Lung cancer Father   . Diabetes Mother   . Skin cancer Mother    Allergies: Allergies  Allergen Reactions  . Clarithromycin Other (  See Comments) and Rash    Other Reaction: Fever  . Morphine Other (See Comments)    Other Reaction: mental status alerted  . Sulfa Antibiotics Hives   Medications: See med rec.  Review of Systems: See HPI for pertinent positives and negatives.   Objective:    General: Well Developed, well nourished, and in no acute distress.  Neuro: Alert and oriented x3.  HEENT: Normocephalic, atraumatic.  Tenderness over the left parotid gland.  Mild submandibular lymphadenopathy.  Sublingual salivary glands mildly erythematous with no obvious enlargement or irregularity Skin: Warm and dry. Cardiac: Regular  rate and rhythm, no murmurs rubs or gallops, no lower extremity edema.  Respiratory: Clear to auscultation bilaterally. Not using accessory muscles, speaking in full sentences.  Impression and Recommendations:    1. Type 2 diabetes mellitus without complication, without long-term current use of insulin ([place]) Will be due for hemoglobin A1c recheck in approximately 1 month.  Continue berberine.  We will see how effective this has been at the recheck.  2. Left facial pain Suspect sialoadenitis may be the culprit of her discomfort.  Continue Augmentin as prescribed.  Discussed using heat/ice for pain control.  Sending in a small supply of tramadol for sparing use for moderate to severe pain.  Advised using sialagogues to stimulate saliva production.  May benefit from massage over the parotid gland several times a day.  3. Attention deficit hyperactivity disorder (ADHD), predominantly inattentive type Continue Vyvanse 50 mg daily.  4. Essential hypertension Continue losartan 25 mg daily.  5. Moderate episode of recurrent major depressive disorder (HCC) Continue higher dose of Wellbutrin at 300 mg daily.  We will recheck in approximately 1 month to see if this dose has made a difference.  6. Mixed hyperlipidemia Continue panthetine.  We will plan to recheck lipid panel at next visit to gauge deficiency.  Return in about 1 month (around 07/16/2020) for DM/HTN/HLD follow up. ___________________________________________ Clearnce Sorrel, DNP, APRN, FNP-BC Primary Care and [place]

## 2020-06-16 ENCOUNTER — Ambulatory Visit (INDEPENDENT_AMBULATORY_CARE_PROVIDER_SITE_OTHER): Payer: No Typology Code available for payment source | Admitting: Rehabilitative and Restorative Service Providers"

## 2020-06-16 ENCOUNTER — Other Ambulatory Visit: Payer: Self-pay

## 2020-06-16 ENCOUNTER — Ambulatory Visit: Payer: Self-pay

## 2020-06-16 ENCOUNTER — Ambulatory Visit (INDEPENDENT_AMBULATORY_CARE_PROVIDER_SITE_OTHER): Payer: No Typology Code available for payment source | Admitting: Sports Medicine

## 2020-06-16 ENCOUNTER — Encounter: Payer: Self-pay | Admitting: Rehabilitative and Restorative Service Providers"

## 2020-06-16 DIAGNOSIS — M6281 Muscle weakness (generalized): Secondary | ICD-10-CM | POA: Diagnosis not present

## 2020-06-16 DIAGNOSIS — M67431 Ganglion, right wrist: Secondary | ICD-10-CM | POA: Diagnosis not present

## 2020-06-16 DIAGNOSIS — R29898 Other symptoms and signs involving the musculoskeletal system: Secondary | ICD-10-CM | POA: Diagnosis not present

## 2020-06-16 DIAGNOSIS — M659 Synovitis and tenosynovitis, unspecified: Secondary | ICD-10-CM | POA: Insufficient documentation

## 2020-06-16 DIAGNOSIS — M5030 Other cervical disc degeneration, unspecified cervical region: Secondary | ICD-10-CM | POA: Diagnosis not present

## 2020-06-16 DIAGNOSIS — M79641 Pain in right hand: Secondary | ICD-10-CM | POA: Diagnosis not present

## 2020-06-16 DIAGNOSIS — M65931 Unspecified synovitis and tenosynovitis, right forearm: Secondary | ICD-10-CM | POA: Insufficient documentation

## 2020-06-16 DIAGNOSIS — M542 Cervicalgia: Secondary | ICD-10-CM

## 2020-06-16 NOTE — Assessment & Plan Note (Addendum)
Melanie [name] is complaining of being unable to fully extend her right wrist, there is a dorsal ganglion, we are going to aspirate and inject this but she had to leave for her physical therapy appointment, she can make an appointment for follow-up for aspiration and injection of the ganglion cyst.

## 2020-06-16 NOTE — Progress Notes (Signed)
    Procedures performed today:     Independent interpretation of notes and tests performed by another provider:   None.  Brief History, Exam, Impression, and Recommendations:    DDD (degenerative disc disease), cervical Right-sided C7 distribution radiculitis, improved considerably with epidural, done at the right C7-T1 level. We are going to proceed with another epidural as she did have dramatic relief.  Ganglion cyst of wrist, right [place] is complaining of being unable to fully extend her right wrist, there is a dorsal ganglion, we are going to aspirate and inject this but she had to leave for her physical therapy appointment, she can make an appointment for follow-up for aspiration and injection of the ganglion cyst.     ___________________________________________ [name]. [name], [name], ABFM., CAQSM. Primary Care and [place] Instructor of [place] of [facility] of Medicine

## 2020-06-16 NOTE — Therapy (Addendum)
[facility] [address] [address] [place], [place], [address] Phone: [phone]   Fax:  [phone]  Physical Therapy Treatment/On Hold Note/ Discharge Summary  Patient Details  Name: Melanie [name] MRN: [id number] Date of Birth: 26-Aug-1965 Referring Provider (PT): Aundria Mems, MD    Patient did not return to PT.  Please refer to the below assessment for last known patient status. Thank you for the referral of this patient. Rudell Cobb, MPT    Encounter Date: 06/16/2020   PT End of Session - 06/16/20 1114    Visit Number 15    Number of Visits 20    Date for PT Re-Evaluation 06/18/20    Authorization Type UMR    PT Start Time 1107    PT Stop Time 1200    PT Time Calculation (min) 53 min    Activity Tolerance Patient tolerated treatment well    Behavior During Therapy WFL for tasks assessed/performed           Past Medical History:  Diagnosis Date  . Bowel obstruction ([place])   . Diabetes mellitus without complication ([place])   . Hypertension     Past Surgical History:  Procedure Laterality Date  . ABDOMINAL HYSTERECTOMY    . MYOMECTOMY      There were no vitals filed for this visit.   Subjective Assessment - 06/16/20 1108    Subjective The patient reports she saw Dr.Thekkekandam this morning and has a ganglion cyst in her R wrist.  She also had onset of pain in her face and has tooth fracture that cannot be worked on until late December.  The patient reports no further intense pain.  She has facial pain due to dental issue and is taking pain meds.    Pertinent History HTN, hypothyroidism, ? fibromyalgia    Patient Stated Goals "Not be aware of my right arm constantly and improve range of motion."    Currently in Pain? Yes    Pain Score 4     Pain Location Wrist    Pain Orientation Right    Pain Descriptors / Indicators Sore    Pain Type Chronic pain    Pain Radiating Towards numbness is not  as profound since cervical injection    Pain Onset More than a month ago    Pain Frequency Constant    Aggravating Factors  driving    Pain Relieving Factors injection helped              [facility] PT Assessment - 06/16/20 1118      Assessment   Medical Diagnosis cervical DDD    Referring Provider (PT) Aundria Mems, MD    Onset Date/Surgical Date 03/26/20    Hand Dominance Right                   [facility] Adult PT Treatment/Exercise - 06/16/20 1133      Self-Care   Self-Care Other Self-Care Comments    Other Self-Care Comments  discussed self mgmt of symptoms using HEP as indicated based on pain; encouraged continued ROM and strengthening via HEP.  Patient has been limited this week due to tooth pain.      Exercises   Exercises Shoulder;Wrist;Other Exercises    Other Exercises  nerve glide per HEP and prayer stretch for nerve glide      Neck Exercises: Supine   Neck Retraction 10 reps      Shoulder Exercises: Seated   Other Seated Exercises thoracic  rotation in sitting with arms overhead      Shoulder Exercises: Sidelying   Other Sidelying Exercises thoracic mobilization with open book x 10 reps R and L with some facilitated ROM      Shoulder Exercises: Standing   Extension Strengthening;Both;10 reps    Retraction AROM;Both;12 reps      Shoulder Exercises: Stretch   Other Shoulder Stretches thoracic stretch attempted over foam roll, and discussed swim pool noodle    Other Shoulder Stretches stretching over towel roll for anterior pec opening      Wrist Exercises   Wrist Flexion AROM;Right    Wrist Extension AROM;Right    Other wrist exercises quadriped over physioball with ant/posterior rocking    Other wrist exercises pronation/supination AROM R UE x 5 reps                  PT Education - 06/16/20 1325    Education Details reviewed all HEP and discussed on hold status    Person(s) Educated Patient    Methods Explanation;Demonstration;Handout     Comprehension Verbalized understanding;Returned demonstration               PT Long Term Goals - 06/16/20 1119      PT LONG TERM GOAL #1   Title The patient will be indep with HEP.    Time 6    Period Weeks    Status Achieved      PT LONG TERM GOAL #2   Title The patient will reduce functional limitation per FOTO from 65% to < or equal to 47%.    Baseline due to no longer focusing on neck/shoulder; PT has also worked on wrist/hand.  Too many varying regions to capture accurate FOTO score    Time 6    Period Weeks    Status Deferred      PT LONG TERM GOAL #3   Title The patient will report resting pain < or equal to 3/10.    Baseline can be 3/10 or less, 4/10 today    Time 6    Period Weeks    Status Partially Met      PT LONG TERM GOAL #4   Title The patient will improve grip strength R hand to equal L.    Baseline 50 lbs R and 80 lbs L last measured    Time 6    Period Weeks    Status Not Met                 Plan - 06/16/20 1329    Clinical Impression Statement The patient has partially met LTGs.   PT has addressed her R shoulder pain, which is significantly improved.  The patient has also experienced wrist, parascapular and neck pain.  PT has addressed each region with specific, impairments based exercises.  The patient is able to demonstrate HEP well and is following up wiht MD for further medical mgmt.  PT placing patient on hold at this time.    PT Treatment/Interventions ADLs/Self Care Home Management;Patient/family education;Therapeutic activities;Therapeutic exercise;Electrical Stimulation;Traction;Moist Heat;Cryotherapy;Taping;Dry needling;Manual techniques;Iontophoresis 29m/ml Dexamethasone    PT Next Visit Plan On hold-- patient to contact PT if questions about ther ex or after interventions performed ( if needed).    PT Home Exercise Plan Access Code: [email or web address]   Consulted and Agree with Plan of Care Patient           Patient will benefit from  skilled therapeutic intervention in order to improve  the following deficits and impairments:  Pain,Decreased range of motion,Decreased strength,Impaired flexibility,Hypomobility,Impaired sensation,Postural dysfunction,Decreased activity tolerance  Visit Diagnosis: Muscle weakness (generalized)  Other symptoms and signs involving the musculoskeletal system  Pain in right hand  Cervicalgia     Problem List Patient Active Problem List   Diagnosis Date Noted  . Ganglion cyst of wrist, right 06/16/2020  . SBO (small bowel obstruction) ([place]) 04/29/2020  . HLD (hyperlipidemia) 04/29/2020  . Type 2 diabetes mellitus without complication, without long-term current use of insulin ([place]) 04/24/2020  . Acute cystitis without hematuria 06/24/2019  . Tenosynovitis, de [place] 06/24/2019  . Primary osteoarthritis of first carpometacarpal joint of right hand 03/18/2019  . Chronic right shoulder pain 03/18/2019  . Bilateral foot pain 03/18/2019  . Primary osteoarthritis of both knees 03/18/2019  . Fibromyalgia 03/15/2019  . Lumbar degenerative disc disease 12/19/2018  . Myalgia 11/27/2018  . Morning stiffness of joints 11/27/2018  . DDD (degenerative disc disease), cervical 11/27/2018  . Swelling of left upper eyelid 09/24/2018  . Attention deficit hyperactivity disorder (ADHD), predominantly inattentive type 02/27/2018  . Arthralgia 02/27/2018  . Screening for malignant neoplasm of colon 02/27/2018  . H/O: hysterectomy 02/27/2018  . History of prediabetes 02/27/2018  . Hypothyroidism 02/27/2018  . NASH (nonalcoholic steatohepatitis) 02/27/2018  . GAD (generalized anxiety disorder) 01/06/2017  . Moderate episode of recurrent major depressive disorder ([place]) 01/06/2017  . Environmental allergies 09/12/2015  . Essential hypertension 09/12/2015  . OSA on CPAP 09/12/2015    Nirali Magouirk,PT 06/16/2020, 1:37 PM  [facility] [address] [place], [place], [address] Phone: [phone]   Fax:  [phone]  Name: Loriene Taunton MRN: [id number] Date of Birth: [birth year]

## 2020-06-16 NOTE — Assessment & Plan Note (Signed)
Right-sided C7 distribution radiculitis, improved considerably with epidural, done at the right C7-T1 level. We are going to proceed with another epidural as she did have dramatic relief.

## 2020-06-17 ENCOUNTER — Other Ambulatory Visit: Payer: Self-pay | Admitting: Medical-Surgical

## 2020-06-17 DIAGNOSIS — F900 Attention-deficit hyperactivity disorder, predominantly inattentive type: Secondary | ICD-10-CM

## 2020-06-17 MED ORDER — LISDEXAMFETAMINE DIMESYLATE 50 MG PO CAPS: 50.0000 mg | ORAL_CAPSULE | Freq: Every day | ORAL | 0 refills | Status: DC

## 2020-06-18 ENCOUNTER — Encounter (HOSPITAL_BASED_OUTPATIENT_CLINIC_OR_DEPARTMENT_OTHER): Payer: No Typology Code available for payment source | Admitting: Internal Medicine

## 2020-06-19 ENCOUNTER — Encounter: Payer: No Typology Code available for payment source | Admitting: Rehabilitative and Restorative Service Providers"

## 2020-06-22 ENCOUNTER — Other Ambulatory Visit: Payer: Self-pay

## 2020-06-22 ENCOUNTER — Ambulatory Visit (HOSPITAL_BASED_OUTPATIENT_CLINIC_OR_DEPARTMENT_OTHER): Payer: No Typology Code available for payment source | Attending: Medical-Surgical | Admitting: Internal Medicine

## 2020-06-22 ENCOUNTER — Encounter: Payer: No Typology Code available for payment source | Admitting: Rehabilitative and Restorative Service Providers"

## 2020-06-22 VITALS — Ht 67.0 in | Wt 219.0 lb

## 2020-06-22 DIAGNOSIS — Z9989 Dependence on other enabling machines and devices: Secondary | ICD-10-CM

## 2020-06-22 DIAGNOSIS — G4733 Obstructive sleep apnea (adult) (pediatric): Secondary | ICD-10-CM | POA: Insufficient documentation

## 2020-06-23 ENCOUNTER — Ambulatory Visit
Admission: RE | Admit: 2020-06-23 | Discharge: 2020-06-23 | Disposition: A | Discharge status: Home / Self Care | Payer: No Typology Code available for payment source | Source: Ambulatory Visit | Attending: Sports Medicine | Admitting: Sports Medicine

## 2020-06-23 DIAGNOSIS — M5030 Other cervical disc degeneration, unspecified cervical region: Secondary | ICD-10-CM

## 2020-06-23 MED ORDER — IOPAMIDOL (ISOVUE-M 300) INJECTION 61%
1.0000 mL | Freq: Once | INTRAMUSCULAR | Status: AC | PRN
  Administered 2020-06-23: 1 mL via EPIDURAL

## 2020-06-23 MED ORDER — TRIAMCINOLONE ACETONIDE 40 MG/ML IJ SUSP (RADIOLOGY)
60.0000 mg | Freq: Once | INTRAMUSCULAR | Status: AC
  Administered 2020-06-23: 60 mg via EPIDURAL

## 2020-06-23 NOTE — Discharge Instructions (Signed)

## 2020-06-24 ENCOUNTER — Ambulatory Visit (INDEPENDENT_AMBULATORY_CARE_PROVIDER_SITE_OTHER): Payer: No Typology Code available for payment source

## 2020-06-24 ENCOUNTER — Ambulatory Visit (INDEPENDENT_AMBULATORY_CARE_PROVIDER_SITE_OTHER): Payer: No Typology Code available for payment source | Admitting: Sports Medicine

## 2020-06-24 DIAGNOSIS — M67431 Ganglion, right wrist: Secondary | ICD-10-CM | POA: Diagnosis not present

## 2020-06-24 DIAGNOSIS — M659 Synovitis and tenosynovitis, unspecified: Secondary | ICD-10-CM | POA: Diagnosis not present

## 2020-06-24 DIAGNOSIS — Z23 Encounter for immunization: Secondary | ICD-10-CM | POA: Diagnosis not present

## 2020-06-24 DIAGNOSIS — M5030 Other cervical disc degeneration, unspecified cervical region: Secondary | ICD-10-CM | POA: Diagnosis not present

## 2020-06-24 DIAGNOSIS — M65931 Unspecified synovitis and tenosynovitis, right forearm: Secondary | ICD-10-CM

## 2020-06-24 NOTE — Assessment & Plan Note (Addendum)
Jaylnn returns, she has what appears to be a fourth extensor compartment tenosynovitis.  We initially suspected this to be a ganglion cyst, ultrasound confirms otherwise, fourth extensor compartment showed significant tenosynovitis, this was injected with ultrasound guidance, return to see me in 1 month for this.

## 2020-06-24 NOTE — Assessment & Plan Note (Signed)
Right-sided C7 radiculitis, she had her second epidural yesterday, continues to be okay, we filled out her disability paperwork today, planned return to work date is 07/14/2020.

## 2020-06-24 NOTE — Progress Notes (Signed)
° ° °  Procedures performed today:    Procedure: Real-time Ultrasound Guided injection of the right fourth extensor tendon sheath Device: Samsung HS60  Verbal informed consent obtained.  Time-out conducted.  Noted no overlying erythema, induration, or other signs of local infection.  Skin prepped in a sterile fashion.  Local anesthesia: Topical Ethyl chloride.  With sterile technique and under real time ultrasound guidance:  1/2 cc lidocaine, 1/2 cc kenalog 40 injected easily  completed without difficulty  Advised to call if fevers/chills, erythema, induration, drainage, or persistent bleeding.  Images permanently stored and available for review in PACS.  Impression: Technically successful ultrasound guided injection.  Independent interpretation of notes and tests performed by another provider:   None.  Brief History, Exam, Impression, and Recommendations:    Tenosynovitis of right wrist fourth extensor Melanie [name] returns, she has what appears to be a fourth extensor compartment tenosynovitis.  We initially suspected this to be a ganglion cyst, ultrasound confirms otherwise, fourth extensor compartment showed significant tenosynovitis, this was injected with ultrasound guidance, return to see me in 1 month for this.  DDD (degenerative disc disease), cervical Right-sided C7 radiculitis, she had her second epidural yesterday, continues to be okay, we filled out her disability paperwork today, planned return to work date is 07/14/2020.    ___________________________________________ [name]. [name], [name], ABFM., CAQSM. Primary Care and [place] Instructor of [place] of [facility] of Medicine

## 2020-06-24 NOTE — Addendum Note (Signed)
Addended by: Jamesetta So on: 06/24/2020 02:54 PM   Modules accepted: Orders

## 2020-06-25 ENCOUNTER — Encounter: Payer: Self-pay | Admitting: Medical-Surgical

## 2020-06-25 ENCOUNTER — Encounter: Payer: No Typology Code available for payment source | Admitting: Rehabilitative and Restorative Service Providers"

## 2020-06-29 ENCOUNTER — Telehealth: Payer: Self-pay | Admitting: Sports Medicine

## 2020-06-29 ENCOUNTER — Telehealth: Payer: Self-pay | Admitting: *Deleted

## 2020-06-29 ENCOUNTER — Ambulatory Visit (AMBULATORY_SURGERY_CENTER): Payer: Self-pay | Admitting: *Deleted

## 2020-06-29 ENCOUNTER — Other Ambulatory Visit: Payer: Self-pay

## 2020-06-29 ENCOUNTER — Other Ambulatory Visit: Payer: Self-pay | Admitting: Osteopathic Medicine

## 2020-06-29 VITALS — Ht 67.0 in | Wt 217.0 lb

## 2020-06-29 DIAGNOSIS — Z9109 Other allergy status, other than to drugs and biological substances: Secondary | ICD-10-CM

## 2020-06-29 DIAGNOSIS — Z1211 Encounter for screening for malignant neoplasm of colon: Secondary | ICD-10-CM

## 2020-06-29 MED FILL — VYVANSE 50 MG CAPSULE: 50 | 30 days supply | Qty: 30 | Fill #0

## 2020-06-29 NOTE — Telephone Encounter (Signed)
The patient dropped off even more FMLA paperwork to fill out, this was done, sent for copying, faxing, scanning.

## 2020-06-29 NOTE — Progress Notes (Signed)
Patient is here in-person for PV. Patient denies any allergies to eggs or soy. Patient denies any problems with anesthesia/sedation. PONV. Patient denies any oxygen use at home. Patient denies taking any diet/weight loss medications or blood thinners. Patient is not being treated for MRSA or C-diff. Patient is aware of our care-partner policy and BTDHR-41 safety protocol. EMMI education assigned to the patient for the procedure, sent to [place].   COVID-19 vaccines completed on 04/03/2020, per patient.

## 2020-06-29 NOTE — Telephone Encounter (Signed)
Patient notified

## 2020-06-29 NOTE — Telephone Encounter (Signed)
Ok to proceed as scheduled

## 2020-06-29 NOTE — Telephone Encounter (Signed)
Dr.Pyrtle,  This patient was referred to [place] in 02/2020 for a direct screening colonoscopy. She was recently dx with a small bowel obstruction per CT scan 04/29/20. The surgeon she saw in the hospital wanted her to go ahead with the colonoscopy. She has hx of diverticulosis and adhesions, hx of 4 abdominal surgeries 2000-2010 per pt. She denies any GI symptoms at her PV today and is only taking a stool softener. The patient's colonoscopy is on 07/01/20 with you, she wanted to have this done by the end of the year. She is a Therapist, sports at [employer]. Ok to proceed with the colonoscopy? Thank you, Khayree Delellis pv

## 2020-06-30 ENCOUNTER — Other Ambulatory Visit: Payer: Self-pay | Admitting: Medical-Surgical

## 2020-06-30 MED FILL — LEVOCETIRIZINE 5 MG TABLET: 5 | 90 days supply | Qty: 90 | Fill #0

## 2020-07-01 ENCOUNTER — Other Ambulatory Visit: Payer: Self-pay

## 2020-07-01 ENCOUNTER — Encounter: Payer: Self-pay | Admitting: Internal Medicine

## 2020-07-01 ENCOUNTER — Ambulatory Visit (AMBULATORY_SURGERY_CENTER): Payer: No Typology Code available for payment source | Admitting: Internal Medicine

## 2020-07-01 VITALS — BP 126/71 | HR 76 | Temp 98.7°F | Resp 26 | Ht 67.0 in | Wt 217.0 lb

## 2020-07-01 DIAGNOSIS — Z1211 Encounter for screening for malignant neoplasm of colon: Secondary | ICD-10-CM | POA: Diagnosis present

## 2020-07-01 DIAGNOSIS — K5900 Constipation, unspecified: Secondary | ICD-10-CM

## 2020-07-01 HISTORY — DX: Constipation, unspecified: K59.00

## 2020-07-01 MED ORDER — FLEET ENEMA 7-19 GM/118ML RE ENEM
1.0000 | ENEMA | Freq: Once | RECTAL | Status: AC
Start: 2020-07-01 — End: 2020-07-01
  Administered 2020-07-01: 1 via RECTAL

## 2020-07-01 MED ORDER — FLEET ENEMA 7-19 GM/118ML RE ENEM
1.0000 | ENEMA | Freq: Once | RECTAL | Status: AC
  Administered 2020-07-01: 1 via RECTAL

## 2020-07-01 MED ORDER — SODIUM CHLORIDE 0.9 % IV SOLN
500.0000 mL | Freq: Once | INTRAVENOUS | Status: DC
Start: 2020-07-01 — End: 2020-07-01

## 2020-07-01 NOTE — Patient Instructions (Addendum)
Handout was given to you on diverticulosis Per Dr. Hilarie Fredrickson add over the counter Miralax daily. Your sugar was 89 in the recovery room. You may resume your current medications today. Repeat screening colonoscopy in 10 years. Please call if any questions or concerns.      YOU HAD AN ENDOSCOPIC PROCEDURE TODAY AT [facility]:   Refer to the procedure report that was given to you for any specific questions about what was found during the examination.  If the procedure report does not answer your questions, please call your gastroenterologist to clarify.  If you requested that your care partner not be given the details of your procedure findings, then the procedure report has been included in a sealed envelope for you to review at your convenience later.  YOU SHOULD EXPECT: Some feelings of bloating in the abdomen. Passage of more gas than usual.  Walking can help get rid of the air that was put into your GI tract during the procedure and reduce the bloating. If you had a lower endoscopy (such as a colonoscopy or flexible sigmoidoscopy) you may notice spotting of blood in your stool or on the toilet paper. If you underwent a bowel prep for your procedure, you may not have a normal bowel movement for a few days.  Please Note:  You might notice some irritation and congestion in your nose or some drainage.  This is from the oxygen used during your procedure.  There is no need for concern and it should clear up in a day or so.  SYMPTOMS TO REPORT IMMEDIATELY:   Following lower endoscopy (colonoscopy or flexible sigmoidoscopy):  Excessive amounts of blood in the stool  Significant tenderness or worsening of abdominal pains  Swelling of the abdomen that is new, acute  Fever of 100F or higher   For urgent or emergent issues, a gastroenterologist can be reached at any hour by calling [phone]. Do not use MyChart messaging for urgent concerns.    DIET:  We do recommend a small  meal at first, but then you may proceed to your regular diet.  Drink plenty of fluids but you should avoid alcoholic beverages for 24 hours.  ACTIVITY:  You should plan to take it easy for the rest of today and you should NOT DRIVE or use heavy machinery until tomorrow (because of the sedation medicines used during the test).    FOLLOW UP: Our staff will call the number listed on your records 48-72 hours following your procedure to check on you and address any questions or concerns that you may have regarding the information given to you following your procedure. If we do not reach you, we will leave a message.  We will attempt to reach you two times.  During this call, we will ask if you have developed any symptoms of COVID 19. If you develop any symptoms (ie: fever, flu-like symptoms, shortness of breath, cough etc.) before then, please call [phone].  If you test positive for Covid 19 in the 2 weeks post procedure, please call and report this information to [place].    If any biopsies were taken you will be contacted by phone or by letter within the next 1-3 weeks.  Please call us at [phone] if you have not heard about the biopsies in 3 weeks.    SIGNATURES/CONFIDENTIALITY: You and/or your care partner have signed paperwork which will be entered into your electronic medical record.  These signatures attest to the fact that  that the information above on your After Visit Summary has been reviewed and is understood.  Full responsibility of the confidentiality of this discharge information lies with you and/or your care-partner.

## 2020-07-01 NOTE — Progress Notes (Signed)
Pt's states no medical or surgical changes since previsit or office visit.  Vitals AG  1305-when admitting pt, pt admits last bm was 1030 AM and was dark brown water with "little balls" of stool, call to Dr Hilarie Fredrickson, order for enema.  1312- administered enema  1322- pt released enema with dark brown water, still with a few 2cm or so pieces of stool  1329- pt passes a little more liquid stool that is a lighter brown but still unable to see to the bottom and 2 round stool pieces visible.  1335-Dr Pyrtle speaks w/ pt, decide to do one more enema and proceed with colonoscopy.  Informed pt's mother of delay in procedure, pt Mom verb understanding.  1350-second enema administered  1400--results are much better, yellow/beige liquid, can see through to the bottom of the towlet

## 2020-07-01 NOTE — Op Note (Signed)
[place] Patient Name: Melanie [name] Procedure Date: 07/01/2020 2:12 PM MRN: [id number] Endoscopist: Jerene Bears , MD Age: 54 Referring MD:  Date of Birth: [birth year] Gender: Female Account #: [id number] Procedure:                Colonoscopy Indications:              Screening for colorectal malignant neoplasm Medicines:                Monitored Anesthesia Care Procedure:                Pre-Anesthesia Assessment:                           - Prior to the procedure, a History and Physical                            was performed, and patient medications and                            allergies were reviewed. The patient's tolerance of                            previous anesthesia was also reviewed. The risks                            and benefits of the procedure and the sedation                            options and risks were discussed with the patient.                            All questions were answered, and informed consent                            was obtained. Prior Anticoagulants: The patient has                            taken no previous anticoagulant or antiplatelet                            agents. ASA Grade Assessment: II - A patient with                            mild systemic disease. After reviewing the risks                            and benefits, the patient was deemed in                            satisfactory condition to undergo the procedure.                           After obtaining informed consent, the colonoscope  was passed under direct vision. Throughout the                            procedure, the patient's blood pressure, pulse, and                            oxygen saturations were monitored continuously. The                            Olympus PCF-H190DL [id number]) Colonoscope was                            introduced through the anus and advanced to the                            cecum, identified by  appendiceal orifice and                            ileocecal valve. The colonoscopy was performed                            without difficulty. The patient tolerated the                            procedure well. The quality of the bowel                            preparation was fair clearing to adequate with                            copious irrigation and lavage. The ileocecal valve,                            appendiceal orifice, and rectum were photographed. Scope In: 2:22:31 PM Scope Out: 2:47:25 PM Scope Withdrawal Time: 0 hours 18 minutes 40 seconds  Total Procedure Duration: 0 hours 24 minutes 54 seconds  Findings:                 The digital rectal exam was normal.                           Many small and large-mouthed diverticula were found                            from ascending colon to sigmoid colon.                           The exam was otherwise without abnormality on                            direct and retroflexion views. Complications:            No immediate complications. Estimated Blood Loss:     Estimated blood loss: none. Impression:               - Severe  diverticulosis from ascending colon to                            sigmoid colon.                           - The examination was otherwise normal on direct                            and retroflexion views.                           - No specimens collected. Recommendation:           - Patient has a contact number available for                            emergencies. The signs and symptoms of potential                            delayed complications were discussed with the                            patient. Return to normal activities tomorrow.                            Written discharge instructions were provided to the                            patient.                           - Resume previous diet.                           - Continue present medications.                           - Repeat  colonoscopy in 10 years for screening                            purposes. Jerene Bears, MD 07/01/2020 2:51:46 PM This report has been signed electronically.

## 2020-07-01 NOTE — Progress Notes (Signed)
1430 Patient experiencing nausea and vomiting.  MD updated and Zofran 4 mg IV given, vss

## 2020-07-01 NOTE — Progress Notes (Signed)
Report given to PACU, vss 

## 2020-07-01 NOTE — Progress Notes (Signed)
Dr. Hilarie Fredrickson said to bring pt's mother to the bedside d/t her being hard of hearing.  No problems noted in the recovery room. Maw

## 2020-07-04 DIAGNOSIS — Z9989 Dependence on other enabling machines and devices: Secondary | ICD-10-CM | POA: Diagnosis not present

## 2020-07-04 DIAGNOSIS — G4733 Obstructive sleep apnea (adult) (pediatric): Secondary | ICD-10-CM | POA: Diagnosis not present

## 2020-07-04 NOTE — Procedures (Signed)
   Patient Name: Melanie, [name] Date: 06/22/2020 Gender: Female D.O.B: 1965/09/19 Age (years): 63 Referring Provider: Samuel Bouche NP Height (inches): 21 Interpreting Physician: Baird Lyons MD, ABSM Weight (lbs): 219 RPSGT: Carolin Coy BMI: 34 MRN: [id number] Neck Size: 15.50  CLINICAL INFORMATION Sleep Study Type: NPSG Indication for sleep study: Diabetes, Fatigue, Hypertension, Obesity, OSA, Snoring Epworth Sleepiness Score: 6  SLEEP STUDY TECHNIQUE As per the AASM Manual for the Scoring of Sleep and Associated Events v2.3 (April 2016) with a hypopnea requiring 4% desaturations.  The channels recorded and monitored were frontal, central and occipital EEG, electrooculogram (EOG), submentalis EMG (chin), nasal and oral airflow, thoracic and abdominal wall motion, anterior tibialis EMG, snore microphone, electrocardiogram, and pulse oximetry.  MEDICATIONS Medications self-administered by patient taken the night of the study : MELATONIN, AUGMENTIN, ASPIRIN, CELEBREX, LEVOCETIRIZINE, LYRICA  SLEEP ARCHITECTURE The study was initiated at 11:26:34 PM and ended at 5:27:33 AM.  Sleep onset time was 45.9 minutes and the sleep efficiency was 49.4%%. The total sleep time was 178.5 minutes.  Stage REM latency was 306.5 minutes.  The patient spent 15.4%% of the night in stage N1 sleep, 83.8%% in stage N2 sleep, 0.3%% in stage N3 and 0.6% in REM.  Alpha intrusion was absent.  Supine sleep was 0.00%.  RESPIRATORY PARAMETERS The overall apnea/hypopnea index (AHI) was 11.8 per hour. There were 1 total apneas, including 1 obstructive, 0 central and 0 mixed apneas. There were 34 hypopneas and 65 RERAs.  The AHI during Stage REM sleep was 120.0 per hour.  AHI while supine was N/A per hour.  The mean oxygen saturation was 91.6%. The minimum SpO2 during sleep was 87.0%.  loud snoring was noted during this study.  CARDIAC DATA The 2 lead EKG demonstrated sinus rhythm. The  mean heart rate was 74.2 beats per minute. Other EKG findings include: None.  LEG MOVEMENT DATA The total PLMS were 0 with a resulting PLMS index of 0.0. Associated arousal with leg movement index was 0.0 .  IMPRESSIONS - Mild obstructive sleep apnea occurred during this study (AHI = 11.8/h). Insufficient early sleep and apneas to qualify for split protocol CPAP titration. - No significant central sleep apnea occurred during this study (CAI = 0.0/h). - Mild oxygen desaturation was noted during this study (Min O2 = 87.0%). Mean O2 sat 93.1%. - The patient snored with loud snoring volume. - No cardiac abnormalities were noted during this study. - Clinically significant periodic limb movements did not occur during sleep. No significant associated arousals.  DIAGNOSIS - Obstructive Sleep Apnea (G47.33)  RECOMMENDATIONS - Suggest CPAP titration sleep study or autopap 5-15. Other options, including a fitted oral appliance or ENT evaluation, would be based on clinical judgment. - Be careful with alcohol, sedatives and other CNS depressants that may worsen sleep apnea and disrupt normal sleep architecture. - Sleep hygiene should be reviewed to assess factors that may improve sleep quality. - Weight management and regular exercise should be initiated or continued if appropriate.  [Electronically signed] 07/04/2020 12:05 PM  Baird Lyons MD, [place], American Board of Sleep Medicine   NPI: [id number]                         [place], [place] of Sleep Medicine  ELECTRONICALLY SIGNED ON:  07/04/2020, 11:59 AM [place] PH: [phone]   FX: [phone] [place]

## 2020-07-06 ENCOUNTER — Telehealth: Payer: Self-pay

## 2020-07-06 ENCOUNTER — Other Ambulatory Visit: Payer: Self-pay | Admitting: Medical-Surgical

## 2020-07-06 DIAGNOSIS — Z9989 Dependence on other enabling machines and devices: Secondary | ICD-10-CM

## 2020-07-06 DIAGNOSIS — G4733 Obstructive sleep apnea (adult) (pediatric): Secondary | ICD-10-CM

## 2020-07-06 NOTE — Telephone Encounter (Signed)
  Follow up Call-  Call back number 07/01/2020  Post procedure Call Back phone  # [phone]  Permission to leave phone message Yes  Some recent data might be hidden     Patient questions:  Do you have a fever, pain , or abdominal swelling? No. Pain Score  0 *  Have you tolerated food without any problems? Yes.    Have you been able to return to your normal activities? Yes.    Do you have any questions about your discharge instructions: Diet   No. Medications  No. Follow up visit  No.  Do you have questions or concerns about your Care? No.  Actions: * If pain score is 4 or above: No action needed, pain <4.  Pt said she really appreciated the care she received.  Everyone made the procedure much more easy to have.  Maw    1. Have you developed a fever since your procedure? no  2.   Have you had an respiratory symptoms (SOB or cough) since your procedure? no  3.   Have you tested positive for COVID 19 since your procedure no  4.   Have you had any family members/close contacts diagnosed with the COVID 19 since your procedure?  no   If yes to any of these questions please route to Joylene John, RN and Joella Prince, RN

## 2020-07-08 ENCOUNTER — Encounter: Payer: No Typology Code available for payment source | Admitting: Gastroenterology

## 2020-07-10 ENCOUNTER — Other Ambulatory Visit: Payer: Self-pay

## 2020-07-10 ENCOUNTER — Ambulatory Visit (INDEPENDENT_AMBULATORY_CARE_PROVIDER_SITE_OTHER): Payer: No Typology Code available for payment source | Admitting: Sports Medicine

## 2020-07-10 DIAGNOSIS — M659 Synovitis and tenosynovitis, unspecified: Secondary | ICD-10-CM

## 2020-07-10 DIAGNOSIS — M65931 Unspecified synovitis and tenosynovitis, right forearm: Secondary | ICD-10-CM

## 2020-07-10 NOTE — Assessment & Plan Note (Signed)
Additional FMLA paperwork filled out, at the last visit we did a fourth extensor compartment injection for fourth extensor compartment tenosynovitis and she returns today with a hand feeling good. Return to work date is 07/14/2020, she does not need to see me again.

## 2020-07-10 NOTE — Progress Notes (Signed)
    Procedures performed today:    None.  Independent interpretation of notes and tests performed by another provider:   None.  Brief History, Exam, Impression, and Recommendations:    Tenosynovitis of right wrist fourth extensor Additional FMLA paperwork filled out, at the last visit we did a fourth extensor compartment injection for fourth extensor compartment tenosynovitis and she returns today with a hand feeling good. Return to work date is 07/14/2020, she does not need to see me again.  I spent 30 minutes of total time managing this patient today, this includes chart review, face to face, and non-face to face time.  ___________________________________________ [name]. [name], [name], ABFM., CAQSM. Primary Care and [place] [employer] [place] [facility]

## 2020-07-17 ENCOUNTER — Encounter: Payer: Self-pay | Admitting: Medical-Surgical

## 2020-07-20 ENCOUNTER — Ambulatory Visit: Payer: No Typology Code available for payment source | Admitting: Medical-Surgical

## 2020-07-20 ENCOUNTER — Telehealth: Payer: No Typology Code available for payment source | Admitting: Sports Medicine

## 2020-07-21 ENCOUNTER — Telehealth: Payer: Self-pay

## 2020-07-21 NOTE — Telephone Encounter (Signed)
Done and in my box, please copy, fax, scan, and return to patient.

## 2020-07-21 NOTE — Telephone Encounter (Signed)
Patient sent in mychart a packet of 28 pages, but only needed 2 pages completed. The pages are fitness for duty forms and the patient would like if this could be completed a.s.a.p. and please notify her in [place]. Paperwork was left in Dr. Mcneil Sober box.

## 2020-07-22 ENCOUNTER — Ambulatory Visit: Payer: No Typology Code available for payment source | Admitting: Sports Medicine

## 2020-07-22 ENCOUNTER — Ambulatory Visit: Payer: No Typology Code available for payment source | Admitting: Medical-Surgical

## 2020-07-22 NOTE — Telephone Encounter (Signed)
Per original message, mychart message sent to patient that forms were ready for her to pick up from the front desk.

## 2020-07-27 ENCOUNTER — Ambulatory Visit (INDEPENDENT_AMBULATORY_CARE_PROVIDER_SITE_OTHER): Payer: No Typology Code available for payment source | Admitting: Medical-Surgical

## 2020-07-27 ENCOUNTER — Other Ambulatory Visit: Payer: Self-pay

## 2020-07-27 ENCOUNTER — Encounter: Payer: Self-pay | Admitting: Medical-Surgical

## 2020-07-27 VITALS — BP 151/89 | HR 82 | Temp 98.0°F | Ht 67.0 in | Wt 217.3 lb

## 2020-07-27 DIAGNOSIS — F900 Attention-deficit hyperactivity disorder, predominantly inattentive type: Secondary | ICD-10-CM

## 2020-07-27 DIAGNOSIS — F411 Generalized anxiety disorder: Secondary | ICD-10-CM

## 2020-07-27 DIAGNOSIS — G4733 Obstructive sleep apnea (adult) (pediatric): Secondary | ICD-10-CM

## 2020-07-27 DIAGNOSIS — I10 Essential (primary) hypertension: Secondary | ICD-10-CM | POA: Diagnosis not present

## 2020-07-27 DIAGNOSIS — E119 Type 2 diabetes mellitus without complications: Secondary | ICD-10-CM

## 2020-07-27 DIAGNOSIS — M5030 Other cervical disc degeneration, unspecified cervical region: Secondary | ICD-10-CM | POA: Diagnosis not present

## 2020-07-27 DIAGNOSIS — M1811 Unilateral primary osteoarthritis of first carpometacarpal joint, right hand: Secondary | ICD-10-CM

## 2020-07-27 DIAGNOSIS — M659 Synovitis and tenosynovitis, unspecified: Secondary | ICD-10-CM

## 2020-07-27 DIAGNOSIS — M79601 Pain in right arm: Secondary | ICD-10-CM

## 2020-07-27 DIAGNOSIS — G8929 Other chronic pain: Secondary | ICD-10-CM

## 2020-07-27 DIAGNOSIS — Z9989 Dependence on other enabling machines and devices: Secondary | ICD-10-CM

## 2020-07-27 DIAGNOSIS — M25511 Pain in right shoulder: Secondary | ICD-10-CM

## 2020-07-27 DIAGNOSIS — Z23 Encounter for immunization: Secondary | ICD-10-CM

## 2020-07-27 DIAGNOSIS — M170 Bilateral primary osteoarthritis of knee: Secondary | ICD-10-CM

## 2020-07-27 LAB — POCT GLYCOSYLATED HEMOGLOBIN (HGB A1C): Hemoglobin A1C: 5.9 % — AB (ref 4.0–5.6)

## 2020-07-27 NOTE — Patient Instructions (Signed)
Pneumococcal Polysaccharide Vaccine (PPSV23): What You Need to Know 1. Why get vaccinated? Pneumococcal polysaccharide vaccine (PPSV23) can prevent pneumococcal disease. Pneumococcal disease refers to any illness caused by pneumococcal bacteria. These bacteria can cause many types of illnesses, including pneumonia, which is an infection of the lungs. Pneumococcal bacteria are one of the most common causes of pneumonia. Besides pneumonia, pneumococcal bacteria can also cause:  Ear infections  Sinus infections  Meningitis (infection of the tissue covering the brain and spinal cord)  Bacteremia (bloodstream infection) Anyone can get pneumococcal disease, but children under 16 years of age, people with certain medical conditions, adults 43 years or older, and cigarette smokers are at the highest risk. Most pneumococcal infections are mild. However, some can result in long-term problems, such as brain damage or hearing loss. Meningitis, bacteremia, and pneumonia caused by pneumococcal disease can be fatal. 2. PPSV23 PPSV23 protects against 23 types of bacteria that cause pneumococcal disease. PPSV23 is recommended for:  All adults 71 years or older,  Anyone 2 years or older with certain medical conditions that can lead to an increased risk for pneumococcal disease. Most people need only one dose of PPSV23. A second dose of PPSV23, and another type of pneumococcal vaccine called PCV13, are recommended for certain high-risk groups. Your health care provider can give you more information. People 65 years or older should get a dose of PPSV23 even if they have already gotten one or more doses of the vaccine before they turned 99. 3. Talk with your health care provider Tell your vaccine provider if the person getting the vaccine:  Has had an allergic reaction after a previous dose of PPSV23, or has any severe, life-threatening allergies. In some cases, your health care provider may decide to postpone  PPSV23 vaccination to a future visit. People with minor illnesses, such as a cold, may be vaccinated. People who are moderately or severely ill should usually wait until they recover before getting PPSV23. Your health care provider can give you more information. 4. Risks of a vaccine reaction  Redness or pain where the shot is given, feeling tired, fever, or muscle aches can happen after PPSV23. People sometimes faint after medical procedures, including vaccination. Tell your provider if you feel dizzy or have vision changes or ringing in the ears. As with any medicine, there is a very remote chance of a vaccine causing a severe allergic reaction, other serious injury, or death. 5. What if there is a serious problem? An allergic reaction could occur after the vaccinated person leaves the clinic. If you see signs of a severe allergic reaction (hives, swelling of the face and throat, difficulty breathing, a fast heartbeat, dizziness, or weakness), call 9-1-1 and get the person to the nearest hospital. For other signs that concern you, call your health care provider. Adverse reactions should be reported to the Vaccine Adverse Event Reporting System (VAERS). Your health care provider will usually file this report, or you can do it yourself. Visit the VAERS website at www.vaers.SamedayNews.es or call (228)184-3808. VAERS is only for reporting reactions, and VAERS staff do not give medical advice. 6. How can I learn more?  Ask your health care provider.  Call your local or state health department.  Contact the Centers for Disease Control and Prevention (CDC): ? Call 304 614 3525 (1-800-CDC-INFO) or ? Visit CDC's website at http://hunter.com/ Vaccine Information Statement PPSV23 Vaccine (05/02/2018) This information is not intended to replace advice given to you by your health care provider. Make sure you discuss  any questions you have with your health care provider. Document Revised: 02/21/2020  Document Reviewed: 02/21/2020 Elsevier Patient Education  [place].

## 2020-07-27 NOTE — Progress Notes (Signed)
Subjective:    CC: chronic follow up  HPI: Pleasant 55 year old female presenting today for follow-up on:  Hypertension-taking losartan 25 mg daily.  Blood pressure has been checked a few times since she started this medication with good results in the 120s over 70s.  Today her blood pressure is a bit elevated.  She notes that she just took her medication just prior to leaving the house.  Also notes that she is on edge today with a couple of big concerns and frustrations. Denies CP, SOB, palpitations, lower extremity edema, dizziness, headaches, or vision changes.  OSA-report received regarding the unsuccessful sleep study.  She is was unable to sleep well without using her CPAP so we have her scheduled for a CPAP titration study.  Weight loss-she is taking Wegovy and admits that she is not as consistent as she should be with it.  To date we have seen approximately 3.25 pounds weight loss over the last 4 weeks.  She is tolerating the medication well without side effects.  Diabetes-she is taking berberine at bedtime.  Did not do well with her diet overall because of some dental issues but is hoping to do better in the coming months.  Anxiety depression-she did try to increase her Wellbutrin to 300 mg daily but notes that she did not tolerate this and it made her feel bad.  She is now back to 150 mg daily and tolerating it well.  MSK concerns-has been doing physical therapy but this is on hold for now.  She is also been seeing [name] for her sports medicine needs.  She expresses that she is quite frustrated regarding paperwork issues and is interested in seeing someone else for her Ortho needs.  She would like to have a referral placed.  I reviewed the past medical history, family history, social history, surgical history, and allergies today and no changes were needed.  Please see the problem list section below in epic for further details.  Past Medical History: Past Medical History:   Diagnosis Date  . Arthritis   . Bowel obstruction ([place])   . Constipation 07/01/2020  . Diabetes mellitus without complication ([place])   . Diverticulosis   . Hypertension   . Post-operative nausea and vomiting   . Sleep apnea    uses CPAP   Past Surgical History: Past Surgical History:  Procedure Laterality Date  . ABDOMINAL HYSTERECTOMY    . COLONOSCOPY  20 years ago    in [place]=diverticulosis   . LAPAROSCOPY     due to bowel perforation  . MYOMECTOMY    . OOPHORECTOMY     unilateral   Social History: Social History   Socioeconomic History  . Marital status: Divorced    Spouse name: Not on file  . Number of children: Not on file  . Years of education: Not on file  . Highest education level: Not on file  Occupational History  . Not on file  Tobacco Use  . Smoking status: Never Smoker  . Smokeless tobacco: Never Used  Vaping Use  . Vaping Use: Never used  Substance and Sexual Activity  . Alcohol use: Yes    Comment: Occassionally  . Drug use: Never  . Sexual activity: Not Currently    Birth control/protection: Surgical  Other Topics Concern  . Not on file  Social History Narrative  . Not on file   Social Determinants of Health   Financial Resource Strain: Not on file  Food Insecurity: Not on file  Transportation Needs: Not on file  Physical Activity: Not on file  Stress: Not on file  Social Connections: Not on file   Family History: Family History  Problem Relation Age of Onset  . Lung cancer Father   . Diabetes Mother   . Skin cancer Mother   . Colon cancer Neg Hx   . Colon polyps Neg Hx   . Esophageal cancer Neg Hx   . Rectal cancer Neg Hx   . Stomach cancer Neg Hx    Allergies: Allergies  Allergen Reactions  . Morphine Other (See Comments)    mental status alerted  . Sulfa Antibiotics Hives  . Clarithromycin Rash and Other (See Comments)    Other Reaction: Fever   Medications: See med rec.  Review of Systems: See HPI for pertinent  positives and negatives.   Objective:    General: Well Developed, well nourished, and in no acute distress.  Neuro: Alert and oriented x3.  HEENT: Normocephalic, atraumatic.  Skin: Warm and dry. Cardiac: Regular rate and rhythm, no murmurs rubs or gallops, no lower extremity edema.  Respiratory: Clear to auscultation bilaterally. Not using accessory muscles, speaking in full sentences.  Impression and Recommendations:    1. Type 2 diabetes mellitus without complication, without long-term current use of insulin (HCC) POCT A1c 5.9% today which is amazing.  Continue berberine.  Mancel Parsons has likely played a positive role in getting her back to a prediabetic range. - POCT glycosylated hemoglobin (Hb A1C)  2. Essential hypertension Continue losartan 25 mg daily.  3. Tenosynovitis of right wrist - Ambulatory referral to Sports Medicine  4. DDD (degenerative disc disease), cervical - Ambulatory referral to Sports Medicine  5. Chronic right shoulder pain - Ambulatory referral to Sports Medicine  6. Primary osteoarthritis of both knees - Ambulatory referral to Sports Medicine  7. Primary osteoarthritis of first carpometacarpal joint of right hand - Ambulatory referral to Sports Medicine  8. Right arm pain - Ambulatory referral to Sports Medicine  9. Need for pneumococcal vaccination Pneumonia vaccine updated in office today. - Pneumococcal polysaccharide vaccine 23-valent greater than or equal to 2yo subcutaneous/IM  10.  ADHD Continue Vyvanse 50 mg daily.  11.  OSA on CPAP Continue with plan to participate in CPAP titration study.  12.  GAD Continue Wellbutrin 150 mg daily.  Return in about 3 months (around 10/25/2020) for ADHD/DM follow up. ___________________________________________ Clearnce Sorrel, DNP, APRN, FNP-BC Primary Care and [place]

## 2020-08-04 MED FILL — LOSARTAN POTASSIUM 25 MG TA: 25 | 90 days supply | Qty: 90 | Fill #1

## 2020-08-04 MED FILL — OLOPATADINE HCL 0.2 % SOLN: 0.2 | 25 days supply | Qty: 3 | Fill #1

## 2020-08-04 MED FILL — LEVOCETIRIZINE 5 MG TABLET: 5 | 90 days supply | Qty: 90 | Fill #0

## 2020-08-04 MED FILL — VYVANSE 50 MG CAPSULE: 50 | 30 days supply | Qty: 30 | Fill #0

## 2020-08-04 MED FILL — IPRATROPIUM 0.03% SPRAY: 0.03 | 44 days supply | Qty: 30 | Fill #1

## 2020-08-04 MED FILL — PREGABALIN 25 MG CAPS: 25 | 30 days supply | Qty: 90 | Fill #1

## 2020-08-04 MED FILL — CYCLOBENZAPRINE HCL 5 MG TA: 5 | 90 days supply | Qty: 90 | Fill #0

## 2020-08-05 ENCOUNTER — Ambulatory Visit: Payer: No Typology Code available for payment source

## 2020-08-14 ENCOUNTER — Ambulatory Visit: Payer: No Typology Code available for payment source | Admitting: Family Medicine

## 2020-08-26 ENCOUNTER — Telehealth (INDEPENDENT_AMBULATORY_CARE_PROVIDER_SITE_OTHER): Payer: No Typology Code available for payment source | Admitting: Medical-Surgical

## 2020-08-26 ENCOUNTER — Encounter: Payer: Self-pay | Admitting: Medical-Surgical

## 2020-08-26 DIAGNOSIS — F331 Major depressive disorder, recurrent, moderate: Secondary | ICD-10-CM

## 2020-08-26 DIAGNOSIS — F900 Attention-deficit hyperactivity disorder, predominantly inattentive type: Secondary | ICD-10-CM

## 2020-08-26 DIAGNOSIS — F411 Generalized anxiety disorder: Secondary | ICD-10-CM

## 2020-08-26 MED ORDER — AMITRIPTYLINE HCL 10 MG PO TABS: ORAL_TABLET | ORAL | 0 refills | Status: DC

## 2020-08-26 NOTE — Progress Notes (Signed)
Virtual Visit via Video Note  I connected with Melanie [name] on 08/26/20 at  1:00 PM EST by a video enabled telemedicine application and verified that I am speaking with the correct person using two identifiers.   I discussed the limitations of evaluation and management by telemedicine and the availability of in person appointments. The patient expressed understanding and agreed to proceed.  Patient location: home Provider locations: office  Subjective:    CC: Anxiety/depression  HPI: Pleasant 55 year old female presenting via [place] video visit to discuss anxiety/depression.  She has been taking Wellbutrin 150 mg daily but notes that she has had a worsening of her depression and anxiety lately.  She recently went back to work after a prolonged absence.  She has decided she needs to look for a new job due to the physical requirements of her current one.  She did find an opportunity and progress through her interviews.  She thought she was going to be off of the job and was waiting on a recommendation from her [employer].  Unfortunately there was an issue with the reference and it got directed to the incorrect person.  She was originally told 4 to 6 weeks before her transfer date but then the date was changed to reflect a 10-week weight.  Now there is a possibility she will not get the job due to the extended.  Of time.  After finding this out last week, she had a bit of a breakdown on Friday.  She notes that she is some better now but she is feeling more depressed and anxious than ever.  She has been having an increase in her back pain for which she uses a heating pad.  She was referred to sports medicine but had to reschedule and has not been able to connect with [name] yet.  With all of these things, she is having lots of crying and angry outburst.  She does have a couple of prescriptions at home 1 for Klonopin and the other for BuSpar.  She tried the BuSpar but felt that her heart was  racing after taking the dose.  She did take 2 separate doses of the Klonopin which, even though expired, seem to help some with her anxiety.  She did reach out to the employee assistance program regarding counseling but has yet to make that connection.  Notes that she is not sleeping well and waking a lot more often.  Also having vivid, unpleasant dreams.  In the past she has tried Zoloft and Lexapro and did not tolerate these.  Has not tried any other antidepressant medications.  Notes that she feels that anxiety, depression, and ADHD are a cyclical situation and treatment is difficult at best.  Would like to have a counselor who is familiar with all 3 and can help her manage them.  Past medical history, Surgical history, Family history not pertinant except as noted below, Social history, Allergies, and medications have been entered into the medical record, reviewed, and corrections made.   Review of Systems: See HPI for pertinent positives and negatives.   Depression screen [facility] 2/9 08/26/2020 09/26/2019 03/14/2019 09/03/2018 03/06/2018  Decreased Interest 2 1 1 2 1   Down, Depressed, Hopeless 1 0 0 1 0  PHQ - 2 Score 3 1 1 3 1   Altered sleeping 1 2 1 2 2   Tired, decreased energy 1 1 1 2 1   Change in appetite 1 1 1 1 2   Feeling bad or failure about yourself  1 1 0 1 0  Trouble concentrating 2 1 2 1 1   Moving slowly or fidgety/restless 0 0 1 1 1   Suicidal thoughts 0 0 0 0 0  PHQ-9 Score 9 7 7 11 8   Difficult doing work/chores Very difficult Somewhat difficult - Very difficult -  Some encounter information is confidential and restricted. Go to Review Flowsheets activity to see all data.   GAD 7 : Generalized Anxiety Score 08/26/2020 09/26/2019 03/14/2019 09/03/2018  Nervous, Anxious, on Edge 1 1 1 2   Control/stop worrying 1 0 0 0  Worry too much - different things 1 1 0 1  Trouble relaxing 1 1 1 1   Restless 0 0 0 1  Easily annoyed or irritable 1 1 1 1   Afraid - awful might happen 0 0 0 1  Total GAD 7  Score 5 4 3 7   Anxiety Difficulty Very difficult Somewhat difficult - Very difficult  Some encounter information is confidential and restricted. Go to Review Flowsheets activity to see all data.   Objective:    General: Speaking clearly in complete sentences without any shortness of breath.  Alert and oriented x3.  Normal judgment. No apparent acute distress.  Impression and Recommendations:    1. Attention deficit hyperactivity disorder (ADHD), predominantly inattentive type Continue Vyvanse 50 mg daily.  Referring to behavioral health for counseling - Ambulatory referral to [place]  2. GAD (generalized anxiety disorder)/depression Agree that her anxiety/depression/ADHD are all interacting with each other and likely making this a more difficult situation that it is.  I do not feel at this time that treatment with benzodiazepines is appropriate given the situational nature of this exacerbation.  Discussed options for treatment of her chronic mental health conditions.  Wellbutrin may be exacerbating her anxiety but she has been on this long-term and feels that it does help somewhat, would like to continue.  Since she did not respond well to SSRIs in the past, considering a trial of amitriptyline as this will help with chronic pain, sleep, as well as anxiety and depression.  Discussed risks versus benefits of using the medication and patient is agreeable to giving it ago.  Starting with 10 mg nightly for 1 week then increase to 20 mg nightly for 1 week.  After that, may increase to 30 mg nightly if she is not experiencing improved sleep and improvement in her mental health symptoms.  Referring to behavioral health for counseling although finding someone who is knowledgeable about ADHD counseling in the setting of general anxiety disorder and depression may be a little more difficult to find.  In the meantime, recommend investigating talk space as this is free for [employer] health employees. -  Ambulatory referral to [facility]  I discussed the assessment and treatment plan with the patient. The patient was provided an opportunity to ask questions and all were answered. The patient agreed with the plan and demonstrated an understanding of the instructions.   The patient was advised to call back or seek an in-person evaluation if the symptoms worsen or if the condition fails to improve as anticipated.  28 minutes of non-face-to-face time was provided during this encounter.  Return in about 4 weeks (around 09/23/2020) for mood follow up.  Clearnce Sorrel, DNP, APRN, FNP-BC [facility]

## 2020-09-03 ENCOUNTER — Ambulatory Visit: Payer: No Typology Code available for payment source

## 2020-09-10 ENCOUNTER — Ambulatory Visit: Payer: No Typology Code available for payment source

## 2020-09-11 ENCOUNTER — Encounter: Payer: Self-pay | Admitting: Medical-Surgical

## 2020-09-11 ENCOUNTER — Other Ambulatory Visit: Payer: Self-pay | Admitting: Medical-Surgical

## 2020-09-11 DIAGNOSIS — F900 Attention-deficit hyperactivity disorder, predominantly inattentive type: Secondary | ICD-10-CM

## 2020-09-11 DIAGNOSIS — M5030 Other cervical disc degeneration, unspecified cervical region: Secondary | ICD-10-CM

## 2020-09-11 MED ORDER — CELECOXIB 200 MG PO CAPS: ORAL_CAPSULE | ORAL | 0 refills | Status: DC

## 2020-09-11 MED ORDER — LISDEXAMFETAMINE DIMESYLATE 50 MG PO CAPS: 50.0000 mg | ORAL_CAPSULE | Freq: Every day | ORAL | 0 refills | Status: DC

## 2020-09-11 MED FILL — CELECOXIB 200 MG CAP: 200 | 90 days supply | Qty: 180 | Fill #0

## 2020-09-11 MED FILL — OLOPATADINE HCL 0.2 % SOLN: 0.2 | 25 days supply | Qty: 3 | Fill #2

## 2020-09-11 MED FILL — VYVANSE 50 MG CAPSULE: 50 | 30 days supply | Qty: 30 | Fill #0

## 2020-09-11 MED FILL — FLUTICASONE PROP 50 MCG SPR: 50 | 90 days supply | Qty: 48 | Fill #1

## 2020-09-12 ENCOUNTER — Encounter (HOSPITAL_BASED_OUTPATIENT_CLINIC_OR_DEPARTMENT_OTHER): Payer: No Typology Code available for payment source | Admitting: Internal Medicine

## 2020-09-23 NOTE — Progress Notes (Unsigned)
I, [name], LAT, ATC acting as a scribe for Melanie Leader, MD.  Subjective:    I'm seeing this patient as a consultation for Melanie Bouche, NP. Note will be routed back to referring provider/PCP.  CC: Right wrist and C-spine pain  HPI: Pt is a 55 y/o female c/o pain in multiple joints throughout the body. Of most concern is pain in her R shoulder and low back pt has been previously seen by [name], however has been frustrated w/ paperwork issues, so she has been referred to see [name]. Pt states that she thinks she has an autoimmune dz.  Pt locates pain to her R lateral shoulder, R radial wrist and low back.  Pt works as an Health visitor.  She has had physical therapy trial for the right shoulder however this occurred in the setting of cervical radiculopathy and was never effective.  She felt a lot better after cervical epidural steroid injection and thinks that PT trial may be more effective again.  Additionally she notes that she is thought to perhaps have Sjogren's based on salivary gland biopsy from when she was a child    Radiates: intermittently, her R shoulder pain will radiate into her R upper arm Mechanical symptoms: No in the R shoulder and yes in the R wrist  Numbness/tingling: No  Aggravates: R shoulder- reaching overhead; resisted pulling; R wrist- R wrist UD AROM; Low back- prolonged standing, walking Treatments tried: PT; R wrist splint/brace; back brace  Dx imaging: 04/18/20 R shoulder MRI and R knee MRI;  B knee, R shoulder and R hand - 03/18/19   Past medical history, Surgical history, Family history, Social history, Allergies, and medications have been entered into the medical record, reviewed.   Review of Systems: No new headache, visual changes, nausea, vomiting, diarrhea, constipation, dizziness, abdominal pain, skin rash, fevers, chills, night sweats, weight loss, swollen lymph nodes, body aches, joint swelling, muscle aches, chest pain, shortness of breath, mood  changes, visual or auditory hallucinations.   Objective:    Vitals:   09/24/20 1249  BP: 130/86  Pulse: 86  SpO2: 98%   General: Well Developed, well nourished, and in no acute distress.  Neuro/Psych: Alert and oriented x3, extra-ocular muscles intact, able to move all 4 extremities, sensation grossly intact. Skin: Warm and dry, no rashes noted.  Respiratory: Not using accessory muscles, speaking in full sentences, trachea midline.  Cardiovascular: Pulses palpable, no extremity edema. Abdomen: Does not appear distended. MSK:  Right shoulder normal. Normal motion pain with abduction. Strength 4/5 abduction 5/5 external/internal rotation. Positive Hawkins and Neer's test negative Yergason's and speeds test positive empty can test.  Cervical motion intact Vascepa strength intact otherwise except noted above.  L-spine normal-appearing Nontender midline. Decreased lumbar motion. Lower extremity strength reflexes and sensation are equal normal throughout bilateral lower extremities.    Lab and Radiology Results    EXAM: MRI OF THE RIGHT SHOULDER WITHOUT CONTRAST  TECHNIQUE: Multiplanar, multisequence MR imaging of the shoulder was performed. No intravenous contrast was administered.  COMPARISON:  Right shoulder x-rays dated March 18, 2019.  FINDINGS: Rotator cuff: Mild-to-moderate supraspinatus tendinosis with bursal surface fraying at the insertion. Small focus of low T2 and T1 signal in the anterior tendon at the insertion, consistent with calcium hydroxyapatite. The infraspinatus, teres minor, and subscapularis tendons are unremarkable.  Muscles: No atrophy or abnormal signal of the muscles of the rotator cuff.  Biceps long head:  Intact and normally positioned.  Acromioclavicular Joint:  Mild arthropathy of the acromioclavicular joint. Type I acromion. Small amount of fluid in the subacromial/subdeltoid bursa.  Glenohumeral Joint: No joint effusion.  No chondral defect.  Labrum: Grossly intact, but evaluation is limited by lack of intraarticular fluid.  Bones: No acute fracture or dislocation. No suspicious bone lesion. Reactive subcortical cystic changes in the posterior greater tuberosity.  Other: None.  IMPRESSION: 1. Supraspinatus calcific tendinosis and bursal surface fraying at the insertion. No high-grade rotator cuff tear. 2. Mild acromioclavicular osteoarthritis. 3. Mild subacromial/subdeltoid bursitis.   Electronically Signed   By: [name] [name]   On: 04/19/2020 07:21 I, Melanie [name], personally (independently) visualized and performed the interpretation of the images attached in this note.  X-ray images L-spine obtained today personally and independently interpreted Mild scoliosis.  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 Await formal radiology review  Procedure: Real-time Ultrasound Guided Injection of right shoulder subacromial bursa Device: Philips Affiniti 50G Images permanently stored and available for review in PACS Verbal informed consent obtained.  Discussed risks and benefits of procedure. Warned about infection bleeding damage to structures skin hypopigmentation and fat atrophy among others. Patient expresses understanding and agreement Time-out conducted.   Noted no overlying erythema, induration, or other signs of local infection.   Skin prepped in a sterile fashion.   Local anesthesia: Topical Ethyl chloride.   With sterile technique and under real time ultrasound guidance:  40 mg of Kenalog and 2 mL of Marcaine injected into right subacromial bursa. Fluid seen entering the bursa.   Completed without difficulty   Pain immediately resolved suggesting accurate placement of the medication.   Advised to call if fevers/chills, erythema, induration, drainage, or persistent bleeding.   Images permanently stored and available for review in the ultrasound unit.  Impression: Technically successful  ultrasound guided injection.         Impression and Recommendations:    Assessment and Plan: 55 y.o. female with multilocation arthralgia and myalgia.  Right shoulder pain thought to be related to previously identified calcific tendinopathy, and subacromial bursitis.  Some of the shoulder pain is probably also related to now much improved cervical radiculopathy.  Discussed options.  Plan for trial subacromial injection and referral to physical therapy.  Reassess in 6 weeks.  Low back pain: Patient does have degenerative changes on x-ray which certainly could be a source of back pain.  However I think a lot of her back pain is related to muscle dysfunction and core weakness.  Physical therapy should also be helpful as well.  Plan for PT referral and recheck in 6 weeks as well.  Patient however notes diffuse ongoing long-term polyarthralgia and myalgias.  Concern for rheumatologic issue.  She has had previous work-up in the past which has not been very positive.  Plan for repeat rheumatologic work-up as listed below.Marland Kitchen  PDMP not reviewed this encounter. Orders Placed This Encounter  Procedures  . [place] LIMITED JOINT SPACE STRUCTURES UP RIGHT(NO LINKED CHARGES)    Order Specific Question:   Reason for Exam (SYMPTOM  OR DIAGNOSIS REQUIRED)    Answer:   R wrist pain    Order Specific Question:   Preferred imaging location?    Answer:   [place]  . DG Lumbar Spine 2-3 Views    Standing Status:   Future    Number of Occurrences:   1    Standing Expiration Date:   09/24/2021    Order Specific Question:   Reason for Exam (SYMPTOM  OR DIAGNOSIS REQUIRED)    Answer:   eval lumbar pain    Order Specific Question:   Is patient pregnant?    Answer:   No    Order Specific Question:   Preferred imaging location?    Answer:   [facility]  . LUPUS(12) PANEL    Standing Status:   Future    Number of Occurrences:   1    Standing Expiration Date:   09/24/2021  .  Comprehensive metabolic panel    Standing Status:   Future    Number of Occurrences:   1    Standing Expiration Date:   09/24/2021  . Sedimentation rate    Standing Status:   Future    Number of Occurrences:   1    Standing Expiration Date:   09/24/2021  . Cyclic citrul peptide antibody, IgG    Standing Status:   Future    Number of Occurrences:   1    Standing Expiration Date:   09/24/2021  . HLA-B27 antigen    Standing Status:   Future    Number of Occurrences:   1    Standing Expiration Date:   09/24/2021  . Ambulatory referral to Physical Therapy    Referral Priority:   Routine    Referral Type:   Physical Medicine    Referral Reason:   Specialty Services Required    Requested Specialty:   Physical Therapy   No orders of the defined types were placed in this encounter.   Discussed warning signs or symptoms. Please see discharge instructions. Patient expresses understanding.   The above documentation has been reviewed and is accurate and complete Melanie [name], [name]

## 2020-09-24 ENCOUNTER — Ambulatory Visit: Payer: No Typology Code available for payment source | Admitting: Family Medicine

## 2020-09-24 ENCOUNTER — Other Ambulatory Visit: Payer: Self-pay

## 2020-09-24 ENCOUNTER — Ambulatory Visit: Payer: Self-pay

## 2020-09-24 ENCOUNTER — Encounter: Payer: Self-pay | Admitting: Family Medicine

## 2020-09-24 ENCOUNTER — Ambulatory Visit (INDEPENDENT_AMBULATORY_CARE_PROVIDER_SITE_OTHER): Payer: No Typology Code available for payment source

## 2020-09-24 VITALS — BP 130/86 | HR 86 | Ht 67.0 in | Wt 217.2 lb

## 2020-09-24 DIAGNOSIS — M545 Low back pain, unspecified: Secondary | ICD-10-CM

## 2020-09-24 DIAGNOSIS — G8929 Other chronic pain: Secondary | ICD-10-CM

## 2020-09-24 DIAGNOSIS — M25511 Pain in right shoulder: Secondary | ICD-10-CM

## 2020-09-24 DIAGNOSIS — M25531 Pain in right wrist: Secondary | ICD-10-CM

## 2020-09-24 DIAGNOSIS — Z1231 Encounter for screening mammogram for malignant neoplasm of breast: Secondary | ICD-10-CM | POA: Diagnosis not present

## 2020-09-24 LAB — COMPREHENSIVE METABOLIC PANEL
ALT: 20 U/L (ref 0–35)
AST: 17 U/L (ref 0–37)
Albumin: 4.1 g/dL (ref 3.5–5.2)
Alkaline Phosphatase: 78 U/L (ref 39–117)
BUN: 14 mg/dL (ref 6–23)
CO2: 29 mEq/L (ref 19–32)
Calcium: 8.9 mg/dL (ref 8.4–10.5)
Chloride: 104 mEq/L (ref 96–112)
Creatinine, Ser: 0.74 mg/dL (ref 0.40–1.20)
GFR: 92.44 mL/min (ref >60.00)
Glucose, Bld: 143 mg/dL — ABNORMAL HIGH (ref 70–99)
Potassium: 4 mEq/L (ref 3.5–5.1)
Sodium: 140 mEq/L (ref 135–145)
Total Bilirubin: 0.7 mg/dL (ref 0.2–1.2)
Total Protein: 7.1 g/dL (ref 6.0–8.3)

## 2020-09-24 LAB — SEDIMENTATION RATE: Sed Rate: 17 mm/hr (ref 0–30)

## 2020-09-24 NOTE — Patient Instructions (Addendum)
Thank you for coming in today.  Call or go to the ER if you develop a large red swollen joint with extreme pain or oozing puss.   Please get labs today before you leave  Please get an Xray today before you leave  Recheck in 6 weeks.

## 2020-09-25 ENCOUNTER — Ambulatory Visit: Payer: No Typology Code available for payment source | Admitting: Rehabilitative and Restorative Service Providers"

## 2020-09-25 NOTE — Progress Notes (Signed)
Metabolic panel shows mildly elevated blood glucose otherwise looks normal. Sed rate is normal.  Other labs are still pending.

## 2020-09-28 LAB — ANTI-NUCLEAR AB-TITER (ANA TITER)
ANA TITER: 1:80 {titer} — ABNORMAL HIGH
ANA TITER: 1:80 {titer} — ABNORMAL HIGH
ANA Titer 1: 1:40 {titer} — ABNORMAL HIGH

## 2020-09-28 LAB — LUPUS(12) PANEL
Anti Nuclear Antibody (ANA): POSITIVE — AB
C3 Complement: 140 mg/dL (ref 83–193)
C4 Complement: 19 mg/dL (ref 15–57)
ENA SM Ab Ser-aCnc: <1 AI
Rheumatoid fact SerPl-aCnc: <14 IU/mL (ref <14)
Ribosomal P Protein Ab: <1 AI
SM/RNP: <1 AI
SSA (Ro) (ENA) Antibody, IgG: <1 AI
SSB (La) (ENA) Antibody, IgG: <1 AI
Scleroderma (Scl-70) (ENA) Antibody, IgG: <1 AI
Thyroperoxidase Ab SerPl-aCnc: 3 IU/mL (ref <9)
ds DNA Ab: <1 IU/mL

## 2020-09-28 LAB — HLA-B27 ANTIGEN: HLA-B27 Antigen: NEGATIVE

## 2020-09-28 LAB — CYCLIC CITRUL PEPTIDE ANTIBODY, IGG: Cyclic Citrullin Peptide Ab: <16 UNITS

## 2020-09-28 NOTE — Progress Notes (Signed)
X-ray lumbar spine shows some arthritis changes in the lumbar spine and some mild scoliosis in the mid thoracic spine

## 2020-09-29 NOTE — Progress Notes (Signed)
Further rheumatologic labs have come back.  HLA-B27 lab for psoriatic arthritis is negative. CCP lab for rheumatoid arthritis is negative. The ANA is mildly positive however the follow-up ANA labs included in the lupus panel are negative.  This indicates that you probably do not have rheumatologic disease.

## 2020-10-01 ENCOUNTER — Ambulatory Visit: Payer: No Typology Code available for payment source | Admitting: Rehabilitative and Restorative Service Providers"

## 2020-10-04 ENCOUNTER — Other Ambulatory Visit (HOSPITAL_BASED_OUTPATIENT_CLINIC_OR_DEPARTMENT_OTHER): Payer: Self-pay

## 2020-10-05 ENCOUNTER — Other Ambulatory Visit (HOSPITAL_BASED_OUTPATIENT_CLINIC_OR_DEPARTMENT_OTHER): Payer: Self-pay

## 2020-10-06 ENCOUNTER — Encounter: Payer: Self-pay | Admitting: Medical-Surgical

## 2020-10-06 ENCOUNTER — Other Ambulatory Visit: Payer: Self-pay

## 2020-10-06 ENCOUNTER — Ambulatory Visit (INDEPENDENT_AMBULATORY_CARE_PROVIDER_SITE_OTHER): Payer: No Typology Code available for payment source | Admitting: Medical-Surgical

## 2020-10-06 ENCOUNTER — Ambulatory Visit: Payer: No Typology Code available for payment source | Admitting: Family Medicine

## 2020-10-06 VITALS — BP 140/83 | HR 83 | Temp 97.5°F | Ht 67.0 in | Wt 209.1 lb

## 2020-10-06 DIAGNOSIS — F900 Attention-deficit hyperactivity disorder, predominantly inattentive type: Secondary | ICD-10-CM | POA: Diagnosis not present

## 2020-10-06 DIAGNOSIS — R300 Dysuria: Secondary | ICD-10-CM | POA: Diagnosis not present

## 2020-10-06 LAB — POCT URINALYSIS DIP (CLINITEK)
Bilirubin, UA: NEGATIVE
Blood, UA: NEGATIVE
Glucose, UA: NEGATIVE mg/dL
Ketones, POC UA: NEGATIVE mg/dL
Nitrite, UA: NEGATIVE
POC PROTEIN,UA: NEGATIVE
Spec Grav, UA: 1.02 (ref 1.010–1.025)
Urobilinogen, UA: 1 E.U./dL
pH, UA: 6 (ref 5.0–8.0)

## 2020-10-06 LAB — WET PREP FOR TRICH, YEAST, CLUE
MICRO NUMBER:: 11732823
Specimen Quality: ADEQUATE

## 2020-10-06 MED ORDER — LISDEXAMFETAMINE DIMESYLATE 50 MG PO CAPS
ORAL_CAPSULE | Freq: Every day | ORAL | 0 refills | Status: DC
Start: 2020-10-06 — End: 2020-10-07

## 2020-10-06 NOTE — Progress Notes (Signed)
Subjective:    CC: dysuria  HPI: Pleasant 55 year old female presenting for dysuria. Notes that she began to have intermittent discomfort with urination starting over the weekend on Saturday. Her discomfort continued on Sunday but has gotten better over the last 2 days. Some frequency and urgency noted as well. Has noted a possible odor to her urine but isn't sure. No hematuria, fever, chills, flank pain, nausea, or hesitancy. Admits that she doesn't drink enough water when she has to work but does okay on her days off. 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 Not currently sexually active.   Amitriptyline is helping her sleep at the 57m dose nightly. Tolerating well without side effects.  Taking Vyvanse as prescribed for ADHD, tolerating well without side effects. No changes in appetite. Will be changing jobs near the end of this month and thinks that is going to help with her focus and mood quite a bit.  I reviewed the past medical history, family history, social history, surgical history, and allergies today and no changes were needed.  Please see the problem list section below in epic for further details.  Past Medical History: Past Medical History:  Diagnosis Date  . Arthritis   . Bowel obstruction ([place]   . Constipation 07/01/2020  . Diabetes mellitus without complication ([facility]   . Diverticulosis   . History of prediabetes 02/27/2018  . Hypertension   . Post-operative nausea and vomiting   . Sleep apnea    uses CPAP   Past Surgical History: Past Surgical History:  Procedure Laterality Date  . ABDOMINAL HYSTERECTOMY    . COLONOSCOPY  20 years ago    in [place]=diverticulosis   . LAPAROSCOPY     due to bowel perforation  . MYOMECTOMY    . OOPHORECTOMY     unilateral   Social History: Social History   Socioeconomic History  . Marital status: Divorced    Spouse name: Not on file  . Number of children: Not on file  .  Years of education: Not on file  . Highest education level: Not on file  Occupational History  . Not on file  Tobacco Use  . Smoking status: Never Smoker  . Smokeless tobacco: Never Used  Vaping Use  . Vaping Use: Never used  Substance and Sexual Activity  . Alcohol use: Yes    Comment: Occassionally  . Drug use: Never  . Sexual activity: Not Currently    Birth control/protection: Surgical  Other Topics Concern  . Not on file  Social History Narrative  . Not on file   Social Determinants of Health   Financial Resource Strain: Not on file  Food Insecurity: Not on file  Transportation Needs: Not on file  Physical Activity: Not on file  Stress: Not on file  Social Connections: Not on file   Family History: Family History  Problem Relation Age of Onset  . Lung cancer Father   . Diabetes Mother   . Skin cancer Mother   . Colon cancer Neg Hx   . Colon polyps Neg Hx   . Esophageal cancer Neg Hx   . Rectal cancer Neg Hx   . Stomach cancer Neg Hx    Allergies: Allergies  Allergen Reactions  . Morphine Other (See Comments)    mental status alerted  . Sulfa Antibiotics Hives  . Clarithromycin Rash and Other (See Comments)    Other Reaction: Fever   Medications: See med  rec.  Review of Systems: See HPI for pertinent positives and negatives.   Objective:    General: Well Developed, well nourished, and in no acute distress.  Neuro: Alert and oriented x3.  HEENT: Normocephalic, atraumatic.  Skin: Warm and dry. Cardiac: Regular rate and rhythm, no murmurs rubs or gallops, no lower extremity edema.  Respiratory: Clear to auscultation bilaterally. Not using accessory muscles, speaking in full sentences.   Impression and Recommendations:    1. Dysuria POCT UA positive for trace leukocytes, negative for blood, protein, nitrites, and glucose. Sending for culture. Wet prep negative. Consider possible dehydration vs. Atrophic vaginitis.  - POCT URINALYSIS DIP  (CLINITEK) - Urine Culture - WET PREP FOR TRICH, YEAST, CLUE  2. Attention deficit hyperactivity disorder (ADHD), predominantly inattentive type Continue Vyvanse as prescribed. Refill sent.   Return if symptoms worsen or fail to improve. ___________________________________________ Clearnce Sorrel, DNP, APRN, FNP-BC [facility] [place]

## 2020-10-07 ENCOUNTER — Other Ambulatory Visit (HOSPITAL_BASED_OUTPATIENT_CLINIC_OR_DEPARTMENT_OTHER): Payer: Self-pay

## 2020-10-07 MED ORDER — LISDEXAMFETAMINE DIMESYLATE 50 MG PO CAPS
ORAL_CAPSULE | Freq: Every day | ORAL | 0 refills | Status: DC
  Filled 2020-10-07: qty 30, 30d supply, fill #0

## 2020-10-07 MED ORDER — AMITRIPTYLINE HCL 10 MG PO TABS
ORAL_TABLET | ORAL | 0 refills | Status: DC
  Filled 2020-10-07: qty 90, 45d supply, fill #0

## 2020-10-07 NOTE — Telephone Encounter (Signed)
Melanie [name] aware that the Rx for Vyvance needs to be cancelled. They said they did not receive a Rx for amitriptyline.

## 2020-10-07 NOTE — Telephone Encounter (Signed)
Re-sent Vyvanse and amitriptyline to the correct pharmacy. Can you please call [facility] and cancel both that were sent to them yesterday?  Thanks, Caryl Asp

## 2020-10-07 NOTE — Telephone Encounter (Signed)
Okay. Thank you.

## 2020-10-08 ENCOUNTER — Ambulatory Visit (INDEPENDENT_AMBULATORY_CARE_PROVIDER_SITE_OTHER): Payer: No Typology Code available for payment source | Admitting: Rehabilitative and Restorative Service Providers"

## 2020-10-08 DIAGNOSIS — M6281 Muscle weakness (generalized): Secondary | ICD-10-CM | POA: Diagnosis not present

## 2020-10-08 DIAGNOSIS — M25511 Pain in right shoulder: Secondary | ICD-10-CM | POA: Diagnosis not present

## 2020-10-08 DIAGNOSIS — G8929 Other chronic pain: Secondary | ICD-10-CM | POA: Diagnosis not present

## 2020-10-08 DIAGNOSIS — M545 Low back pain, unspecified: Secondary | ICD-10-CM | POA: Diagnosis not present

## 2020-10-08 LAB — URINE CULTURE
MICRO NUMBER:: 11736507
SPECIMEN QUALITY:: ADEQUATE

## 2020-10-08 NOTE — Therapy (Addendum)
[place], [place], [address] Phone: [phone]   Fax:  [phone]  Physical Therapy Evaluation  Patient Details  Name: Melanie [name] MRN: [id number] Date of Birth: Nov 08, 1965 Referring Provider (PT): [name], MD   Encounter Date: 10/08/2020   PT End of Session - 10/08/20 1251    Visit Number 1    Number of Visits 6    Date for PT Re-Evaluation 11/19/20         Time in:  1150 Time out:  1230  Past Medical History:  Diagnosis Date  . Arthritis   . Bowel obstruction ([place])   . Constipation 07/01/2020  . Diabetes mellitus without complication ([place])   . Diverticulosis   . History of prediabetes 02/27/2018  . Hypertension   . Post-operative nausea and vomiting   . Sleep apnea    uses CPAP    Past Surgical History:  Procedure Laterality Date  . ABDOMINAL HYSTERECTOMY    . COLONOSCOPY  20 years ago    in [place]=diverticulosis   . LAPAROSCOPY     due to bowel perforation  . MYOMECTOMY    . OOPHORECTOMY     unilateral    There were no vitals filed for this visit.    Subjective Assessment - 10/08/20 1151    Subjective The patient is known to our clinic from prior physical therapy for R shoulder and low back.  She returns today with chronic pain continuing.  She had an injection in R shoulder on 09/24/20.  She also noted increasing pain in her low back with recent imaging.  She returned to work in January and is about to switch from ED to PACU, which she thinks will be less stressful overall.   "At this moment, I'm not having a bad pain day."  She notes the shoulder gets worse with use.  She is splinting her R hand at night to reduce wrist discomfort.    Diagnostic tests Degenerative changes as above. No acute compression fracture or  significant malalignment. Mild levoscoliosis centered in the  midthoracic spine.    Patient Stated Goals Less pain, more mobility, strengthening.     Currently in Pain? Yes    Pain Score 4     Pain Location Back    Pain Orientation Lower;Mid    Pain Descriptors / Indicators Discomfort    Pain Type Chronic pain    Pain Radiating Towards thoracic pain is 5-6/10 and radiates into R shoulder blade    Pain Onset More than a month ago    Pain Frequency Intermittent    Aggravating Factors  movement, standing, stiffness after being in one position    Pain Relieving Factors gentle movement    Multiple Pain Sites Yes    Pain Location Shoulder    Pain Orientation Right    Pain Descriptors / Indicators Discomfort    Pain Type Chronic pain    Aggravating Factors  work overuse    Pain Relieving Factors rest              [facility] PT Assessment - 10/08/20 1203      Assessment   Medical Diagnosis Chronic R shoulder pain, lumbar pain    Referring Provider (PT) [name], MD    Onset Date/Surgical Date 09/24/20    Hand Dominance Right    Prior Therapy known to our clinic from prior PT      Precautions   Precautions None  Restrictions   Weight Bearing Restrictions No      Balance Screen   Has the patient fallen in the past 6 months No    Has the patient had a decrease in activity level because of a fear of falling?  No    Is the patient reluctant to leave their home because of a fear of falling?  No      Home Ecologist residence      Prior Function   Level of Independence Independent    Vocation Full time employment    Vocation Requirements 36 hours/week      Observation/Other Assessments   Focus on Therapeutic Outcomes (FOTO)  60% functional status score      Sensation   Light Touch Appears Intact      Posture/Postural Control   Posture/Postural Control Postural limitations    Postural Limitations Rounded Shoulders;Forward head      ROM / Strength   AROM / PROM / Strength AROM;Strength      AROM   AROM Assessment Site Shoulder;Lumbar;Thoracic    Right/Left Shoulder Right;Left     Right Shoulder Flexion 150 Degrees   gets "zing" in R parascapular muscles and R UE "uncomfortable"   Left Shoulder Flexion 150 Degrees    Lumbar Flexion 25% limitation    Lumbar Extension 25% limitation    Lumbar - Right Side Bend 25% limitation   painful   Lumbar - Left Side Bend 25% limitation    Thoracic Flexion increased flexion at rest      Strength   Strength Assessment Site Shoulder;Elbow;Hip;Knee;Ankle    Right/Left Shoulder Right;Left    Right Shoulder Flexion 4+/5    Right Shoulder ABduction 4+/5    Left Shoulder Flexion 5/5    Left Shoulder ABduction 5/5    Right/Left Elbow Right;Left    Right Elbow Flexion 5/5    Right Elbow Extension 5/5    Left Elbow Flexion 5/5    Left Elbow Extension 5/5    Right/Left Hip Right;Left    Right Hip Flexion 4/5    Left Hip Flexion 4/5    Right/Left Knee Right;Left   pain in R low back with MMT   Right Knee Flexion 5/5    Right Knee Extension 5/5    Left Knee Flexion 5/5    Left Knee Extension 5/5    Right/Left Ankle Right;Left    Right Ankle Dorsiflexion 5/5    Right Ankle Plantar Flexion --   can do 20 bilat heel raises   Left Ankle Dorsiflexion 5/5      Flexibility   Soft Tissue Assessment /Muscle Length yes    Hamstrings right hamstring limited as compared to left      Palpation   Spinal mobility significant hypomobility of thoracic and lumbar spine with CPA limited in mid thoracic and upper lumbar spine;    SI assessment  tender on R side with SI compression    Palpation comment patient demonstrates a clicking in her low back with pelvic anterior/posterior rotation; tender R QL and R PSIS region                      Objective measurements completed on examination: See above findings.       [facility] Adult PT Treatment/Exercise - 10/08/20 1203      Exercises   Exercises Lumbar;Shoulder      Lumbar Exercises: [employer] U[employer] 5 reps    Other  Lumbar Stretch Exercise door frame stretch for pecs due to  thoracic and R shoulder discomfort      Lumbar Exercises: Supine   Bridge 10 reps      Shoulder Exercises: Prone   Retraction Both;10 reps                  PT Education - 10/08/20 1250    Education Details HEP    Person(s) Educated Patient    Methods Explanation;Demonstration;Handout    Comprehension Verbalized understanding;Returned demonstration               PT Long Term Goals - 10/08/20 1252      PT LONG TERM GOAL #1   Title The patient will be indep with HEP.    Time 6    Period Weeks    Target Date 11/19/20      PT LONG TERM GOAL #2   Title The patient will improve FOTO score from 60% up to 65% to demo improved functional abilities.    Time 6    Period Weeks    Target Date 11/19/20      PT LONG TERM GOAL #3   Title The patient will report resting pain < or equal to 3/10.    Time 6    Period Weeks    Target Date 11/19/20      PT LONG TERM GOAL #4   Title The patient will improve bilateral hip strength to 5/5 and R shoulder strength to 5/5.    Time 6    Period Weeks    Target Date 11/19/20                  Plan - 10/08/20 1255    Clinical Impression Statement The patient is a 55 yo female presenting to OP physical therapy with chronic R shoulder pain and worsening low back pain.  In addition, she has ongoing thoracic and parascapular pain.  She presents with significant hypomobility in thoracic and lumbar spine, muscle tightness bilat quads and R HS, R shoulder weakness, bilat hip weakness, core weakness, and pain limiting functional activities.  PT to address deficits to optimize current functional status.    Stability/Clinical Decision Making Stable/Uncomplicated    Clinical Decision Making Low    Rehab Potential Good    PT Frequency 1x / week    PT Duration 6 weeks    PT Treatment/Interventions Taping;Patient/family education;ADLs/Self Care Home Management;Therapeutic activities;Therapeutic exercise;Electrical Stimulation;Iontophoresis  26m/ml Dexamethasone;Traction;Moist Heat;Manual techniques;Dry needling    PT Next Visit Plan progress self mobilization spine into extension, thoracic rotation, joint mobs, progress postural strengthening, R UE strength and bilat hip strength/core strength    PT Home Exercise Plan Access Code: [email or web address]   Consulted and Agree with Plan of Care Patient           Patient will benefit from skilled therapeutic intervention in order to improve the following deficits and impairments:  Pain,Hypomobility,Impaired flexibility,Decreased strength,Decreased range of motion,Increased fascial restricitons,Decreased activity tolerance,Postural dysfunction  Visit Diagnosis: Chronic pain in right shoulder  Chronic right-sided low back pain without sciatica  Muscle weakness (generalized)     Problem List Patient Active Problem List   Diagnosis Date Noted  . Tenosynovitis of right wrist fourth extensor 06/16/2020  . SBO (small bowel obstruction) ([place] 04/29/2020  . HLD (hyperlipidemia) 04/29/2020  . Type 2 diabetes mellitus without complication, without long-term current use of insulin ([place] 04/24/2020  . Acute cystitis without hematuria 06/24/2019  . Tenosynovitis, de QHuman resources officer  06/24/2019  . Primary osteoarthritis of first carpometacarpal joint of right hand 03/18/2019  . Chronic right shoulder pain 03/18/2019  . Bilateral foot pain 03/18/2019  . Primary osteoarthritis of both knees 03/18/2019  . Fibromyalgia 03/15/2019  . Lumbar degenerative disc disease 12/19/2018  . Myalgia 11/27/2018  . Morning stiffness of joints 11/27/2018  . DDD (degenerative disc disease), cervical 11/27/2018  . Swelling of left upper eyelid 09/24/2018  . Attention deficit hyperactivity disorder (ADHD), predominantly inattentive type 02/27/2018  . Arthralgia 02/27/2018  . Screening for malignant neoplasm of colon 02/27/2018  . H/O: hysterectomy 02/27/2018  . Hypothyroidism 02/27/2018  . NASH (nonalcoholic  steatohepatitis) 02/27/2018  . GAD (generalized anxiety disorder) 01/06/2017  . Moderate episode of recurrent major depressive disorder ([place]) 01/06/2017  . Environmental allergies 09/12/2015  . Essential hypertension 09/12/2015  . OSA on CPAP 09/12/2015    Maahi Lannan , PT 10/08/2020, 1:00 PM  [facility] [place], [place], [address] Phone: [phone]   Fax:  [phone]  Name: Melanie [name] MRN: [id number] Date of Birth: 11-Mar-1966

## 2020-10-08 NOTE — Patient Instructions (Signed)
Access Code: [facility] URL: [email or web address] Date: 10/08/2020 Prepared by: Rudell Cobb  Exercises Prone Scapular Retraction - 2 x daily - 7 x weekly - 2 sets - 15 reps Prone Press Up - 2 x daily - 7 x weekly - 1 sets - 10 reps Doorway Pec Stretch at 60 Elevation - 2 x daily - 7 x weekly - 1 sets - 3 reps - 30 s econds hold

## 2020-10-13 ENCOUNTER — Ambulatory Visit (INDEPENDENT_AMBULATORY_CARE_PROVIDER_SITE_OTHER): Payer: No Typology Code available for payment source | Admitting: Rehabilitative and Restorative Service Providers"

## 2020-10-13 ENCOUNTER — Other Ambulatory Visit: Payer: Self-pay

## 2020-10-13 DIAGNOSIS — M6281 Muscle weakness (generalized): Secondary | ICD-10-CM

## 2020-10-13 DIAGNOSIS — M545 Low back pain, unspecified: Secondary | ICD-10-CM

## 2020-10-13 DIAGNOSIS — R29898 Other symptoms and signs involving the musculoskeletal system: Secondary | ICD-10-CM

## 2020-10-13 DIAGNOSIS — G8929 Other chronic pain: Secondary | ICD-10-CM

## 2020-10-13 DIAGNOSIS — M25511 Pain in right shoulder: Secondary | ICD-10-CM | POA: Diagnosis not present

## 2020-10-13 NOTE — Therapy (Signed)
[place], [place], [address] Phone: [phone]   Fax:  [phone]  Physical Therapy Treatment  Patient Details  Name: Melanie [name] MRN: [id number] Date of Birth: 1966-07-02 Referring Provider (PT): [name], MD   Encounter Date: 10/13/2020   PT End of Session - 10/13/20 1111    Visit Number 2    Number of Visits 6    Date for PT Re-Evaluation 11/19/20    PT Start Time 1107    PT Stop Time 1148    PT Time Calculation (min) 41 min    Activity Tolerance Patient tolerated treatment well    Behavior During Therapy [facility] for tasks assessed/performed           Past Medical History:  Diagnosis Date  . Arthritis   . Bowel obstruction ([place])   . Constipation 07/01/2020  . Diabetes mellitus without complication ([place])   . Diverticulosis   . History of prediabetes 02/27/2018  . Hypertension   . Post-operative nausea and vomiting   . Sleep apnea    uses CPAP    Past Surgical History:  Procedure Laterality Date  . ABDOMINAL HYSTERECTOMY    . COLONOSCOPY  20 years ago    in [place]=diverticulosis   . LAPAROSCOPY     due to bowel perforation  . MYOMECTOMY    . OOPHORECTOMY     unilateral    There were no vitals filed for this visit.   Subjective Assessment - 10/13/20 1108    Subjective The patient has 4/10 pain at rest.  She had increased pain after evaluation in R rhomboid region.  She did bridge and door frame stretch, but not the strengthening activities for posture.    Patient Stated Goals Less pain, more mobility, strengthening.    Currently in Pain? Yes    Pain Score 4     Pain Location Back    Pain Orientation Mid    Pain Descriptors / Indicators Discomfort    Pain Type Chronic pain    Pain Onset More than a month ago    Pain Frequency Intermittent              [facility] PT Assessment - 10/13/20 1114      Assessment   Medical Diagnosis Chronic R shoulder pain, lumbar  pain    Referring Provider (PT) [name], MD    Onset Date/Surgical Date 09/24/20                         [facility] Adult PT Treatment/Exercise - 10/13/20 1114      Exercises   Exercises Lumbar;Shoulder      Lumbar Exercises: Stretches   Other Lumbar Stretch Exercise thoracic extension over coregeous ball with shoulder flexion and into horizontal abduction; also performed pool noodle extension for self mobilization of the thoracic spine    Other Lumbar Stretch Exercise standing door frame QL stretch      Shoulder Exercises: Sidelying   Other Sidelying Exercises passive scapular protraction/retraction and shoulder rolls      Shoulder Exercises: Standing   Retraction Strengthening;Both;12 reps    Retraction Limitations with pool noodle    Other Standing Exercises Low Rows with red theraband with handles x 15 reps      Manual Therapy   Manual Therapy Soft tissue mobilization;Scapular mobilization    Manual therapy comments to reduce tightness and pain    Soft tissue mobilization STM R parascapualr  musculature in sitting and sidelying, erector spinae thoracic sine    Scapular Mobilization sidelying scapular mobility and stretching                       PT Long Term Goals - 10/08/20 1252      PT LONG TERM GOAL #1   Title The patient will be indep with HEP.    Time 6    Period Weeks    Target Date 11/19/20      PT LONG TERM GOAL #2   Title The patient will improve FOTO score from 60% up to 65% to demo improved functional abilities.    Time 6    Period Weeks    Target Date 11/19/20      PT LONG TERM GOAL #3   Title The patient will report resting pain < or equal to 3/10.    Time 6    Period Weeks    Target Date 11/19/20      PT LONG TERM GOAL #4   Title The patient will improve bilateral hip strength to 5/5 and R shoulder strength to 5/5.    Time 6    Period Weeks    Target Date 11/19/20                 Plan - 10/13/20 1113     Clinical Impression Statement The patient has more focal pain point today at inferior, medial aspect of R scapula.  She is tender in thoracic multifidi and rhomboids, as well as lateral scapular musculature.  PT to continue to patient tolerance.  Modified HEP based on feedback.    PT Treatment/Interventions Taping;Patient/family education;ADLs/Self Care Home Management;Therapeutic activities;Therapeutic exercise;Electrical Stimulation;Iontophoresis 59m/ml Dexamethasone;Traction;Moist Heat;Manual techniques;Dry needling    PT Next Visit Plan taping and DN to add to strengthening HEP, thoracic mobilization ; ADD QL stretch for scapular mobilization.    PT Home Exercise Plan Access Code: [email or web address]   Consulted and Agree with Plan of Care Patient           Patient will benefit from skilled therapeutic intervention in order to improve the following deficits and impairments:     Visit Diagnosis: Chronic pain in right shoulder  Chronic right-sided low back pain without sciatica  Muscle weakness (generalized)  Other symptoms and signs involving the musculoskeletal system     Problem List Patient Active Problem List   Diagnosis Date Noted  . Tenosynovitis of right wrist fourth extensor 06/16/2020  . SBO (small bowel obstruction) ([place] 04/29/2020  . HLD (hyperlipidemia) 04/29/2020  . Type 2 diabetes mellitus without complication, without long-term current use of insulin ([place] 04/24/2020  . Acute cystitis without hematuria 06/24/2019  . Tenosynovitis, de [place] 06/24/2019  . Primary osteoarthritis of first carpometacarpal joint of right hand 03/18/2019  . Chronic right shoulder pain 03/18/2019  . Bilateral foot pain 03/18/2019  . Primary osteoarthritis of both knees 03/18/2019  . Fibromyalgia 03/15/2019  . Lumbar degenerative disc disease 12/19/2018  . Myalgia 11/27/2018  . Morning stiffness of joints 11/27/2018  . DDD (degenerative disc disease), cervical 11/27/2018  . Swelling of  left upper eyelid 09/24/2018  . Attention deficit hyperactivity disorder (ADHD), predominantly inattentive type 02/27/2018  . Arthralgia 02/27/2018  . Screening for malignant neoplasm of colon 02/27/2018  . H/O: hysterectomy 02/27/2018  . Hypothyroidism 02/27/2018  . NASH (nonalcoholic steatohepatitis) 02/27/2018  . GAD (generalized anxiety disorder) 01/06/2017  . Moderate episode of recurrent major depressive disorder ([place] 01/06/2017  .  Environmental allergies 09/12/2015  . Essential hypertension 09/12/2015  . OSA on CPAP 09/12/2015    Raffi Milstein, PT 10/13/2020, 1:11 PM  [facility] [place], [place], [address] Phone: [phone]   Fax:  [phone]  Name: Melanie [name] MRN: [id number] Date of Birth: 01/12/1966

## 2020-10-22 ENCOUNTER — Other Ambulatory Visit: Payer: Self-pay

## 2020-10-22 ENCOUNTER — Encounter: Payer: Self-pay | Admitting: Physical Therapy

## 2020-10-22 ENCOUNTER — Ambulatory Visit (INDEPENDENT_AMBULATORY_CARE_PROVIDER_SITE_OTHER): Payer: No Typology Code available for payment source | Admitting: Physical Therapy

## 2020-10-22 DIAGNOSIS — M25511 Pain in right shoulder: Secondary | ICD-10-CM

## 2020-10-22 DIAGNOSIS — M6281 Muscle weakness (generalized): Secondary | ICD-10-CM

## 2020-10-22 DIAGNOSIS — M545 Low back pain, unspecified: Secondary | ICD-10-CM

## 2020-10-22 DIAGNOSIS — R29898 Other symptoms and signs involving the musculoskeletal system: Secondary | ICD-10-CM

## 2020-10-22 DIAGNOSIS — G8929 Other chronic pain: Secondary | ICD-10-CM

## 2020-10-22 NOTE — Therapy (Signed)
[place], [place], [address] Phone: [phone]   Fax:  [phone]  Physical Therapy Treatment  Patient Details  Name: Melanie [name] MRN: [id number] Date of Birth: 08/12/1965 Referring Provider (PT): [name], MD   Encounter Date: 10/22/2020   PT End of Session - 10/22/20 1154    Visit Number 3    Number of Visits 6    Date for PT Re-Evaluation 11/19/20    PT Start Time 3557    PT Stop Time 1152    PT Time Calculation (min) 47 min    Activity Tolerance Patient tolerated treatment well    Behavior During Therapy [facility] for tasks assessed/performed           Past Medical History:  Diagnosis Date  . Arthritis   . Bowel obstruction ([place])   . Constipation 07/01/2020  . Diabetes mellitus without complication ([place])   . Diverticulosis   . History of prediabetes 02/27/2018  . Hypertension   . Post-operative nausea and vomiting   . Sleep apnea    uses CPAP    Past Surgical History:  Procedure Laterality Date  . ABDOMINAL HYSTERECTOMY    . COLONOSCOPY  20 years ago    in [place]=diverticulosis   . LAPAROSCOPY     due to bowel perforation  . MYOMECTOMY    . OOPHORECTOMY     unilateral    There were no vitals filed for this visit.   Subjective Assessment - 10/22/20 1109    Subjective "Today my shoulder feels good.  The injection has decreased the pain."  She complains of a persistant "spot" in upper right back.  She was "not good" for 1.5 day after last therapy session.  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    Patient Stated Goals Less pain, more mobility, strengthening.    Currently in Pain? Yes    Pain Score 4     Pain Location Generalized    Pain Descriptors / Indicators Aching;Tightness    Aggravating Factors  prolonged positions.    Pain Relieving Factors gentle movement and heating pad              [facility] PT Assessment -  10/22/20 0001      Assessment   Medical Diagnosis Chronic R shoulder pain, lumbar pain    Referring Provider (PT) [name], MD    Onset Date/Surgical Date 09/24/20    Hand Dominance Right    Prior Therapy known to our clinic from prior PT            [facility] Adult PT Treatment/Exercise - 10/22/20 0001      Self-Care   Self-Care Other Self-Care Comments    Other Self-Care Comments  reviewed self massage (verbally) with ball to thoracic musculature. Pt verbalized understanding.  Discussed moderating exercise as to not overdo it - should feel fatigue when working muscles, but not pain.      Lumbar Exercises: Stretches   Other Lumbar Stretch Exercise thoracic ext over pool noodle, pool noodle foam rolling to thoracic spine.  horiz abdct of arms while laying on pool noodle along spine x 2 min.    Other Lumbar Stretch Exercise standing door frame QL stretch      Lumbar Exercises: Standing   Wall Slides 10 reps   quarter/half squat     Lumbar Exercises: Supine   Bridge 10 reps;5 seconds  Shoulder Exercises: Supine   External Rotation Both;10 reps;Strengthening    Theraband Level (Shoulder External Rotation) Level 1 (Yellow)      Shoulder Exercises: Sidelying   Other Sidelying Exercises open book x 2 reps of straight arm and 2 reps hand behind head, each side.   go tolerance.     Shoulder Exercises: Standing   Other Standing Exercises reverse wall push up with scap retraction x 10 reps, cues for form.    Other Standing Exercises thoracic rotation with forearms on wall x 10 each direction.      Shoulder Exercises: Stretch   Wall Stretch - Flexion 4 reps;10 seconds   sliding hands up cabinet; with added lateral trunk flexion   Other Shoulder Stretches bilat shoulder mid level doorway stretch x 20 sec x 3 reps, cues for form.                  PT Education - 10/22/20 1159    Education Details HEP updated.    Person(s) Educated Patient    Methods  Explanation;Demonstration;Verbal cues;Handout    Comprehension Verbalized understanding;Returned demonstration               PT Long Term Goals - 10/08/20 1252      PT LONG TERM GOAL #1   Title The patient will be indep with HEP.    Time 6    Period Weeks    Target Date 11/19/20      PT LONG TERM GOAL #2   Title The patient will improve FOTO score from 60% up to 65% to demo improved functional abilities.    Time 6    Period Weeks    Target Date 11/19/20      PT LONG TERM GOAL #3   Title The patient will report resting pain < or equal to 3/10.    Time 6    Period Weeks    Target Date 11/19/20      PT LONG TERM GOAL #4   Title The patient will improve bilateral hip strength to 5/5 and R shoulder strength to 5/5.    Time 6    Period Weeks    Target Date 11/19/20                 Plan - 10/22/20 1154    Clinical Impression Statement Some decreased tolerance for Rt flexion overhead stretch (in doorway and arms sliding up cabinets); reduced with rest.  Overall, pt tolerated all other exercises well. Minor cues to avoid over-doing it and increasing pain level.  Reviewed self care with massage to thoracic paraspinals with ball (verbally).  Pt making gradual progress towards goals.    Rehab Potential Good    PT Frequency 1x / week    PT Duration 6 weeks    PT Treatment/Interventions Taping;Patient/family education;ADLs/Self Care Home Management;Therapeutic activities;Therapeutic exercise;Electrical Stimulation;Iontophoresis 29m/ml Dexamethasone;Traction;Moist Heat;Manual techniques;Dry needling    PT Next Visit Plan assess response to new exercises and self care routine.  MMT LE/UE (goal #4)    PT Home Exercise Plan Access Code: [email or web address]   Consulted and Agree with Plan of Care Patient           Patient will benefit from skilled therapeutic intervention in order to improve the following deficits and impairments:  Pain,Hypomobility,Impaired flexibility,Decreased  strength,Decreased range of motion,Increased fascial restricitons,Decreased activity tolerance,Postural dysfunction  Visit Diagnosis: Chronic pain in right shoulder  Chronic right-sided low back pain without sciatica  Muscle weakness (generalized)  Other  symptoms and signs involving the musculoskeletal system     Problem List Patient Active Problem List   Diagnosis Date Noted  . Tenosynovitis of right wrist fourth extensor 06/16/2020  . SBO (small bowel obstruction) ([place]) 04/29/2020  . HLD (hyperlipidemia) 04/29/2020  . Type 2 diabetes mellitus without complication, without long-term current use of insulin ([place]) 04/24/2020  . Acute cystitis without hematuria 06/24/2019  . Tenosynovitis, de Quervain 06/24/2019  . Primary osteoarthritis of first carpometacarpal joint of right hand 03/18/2019  . Chronic right shoulder pain 03/18/2019  . Bilateral foot pain 03/18/2019  . Primary osteoarthritis of both knees 03/18/2019  . Fibromyalgia 03/15/2019  . Lumbar degenerative disc disease 12/19/2018  . Myalgia 11/27/2018  . Morning stiffness of joints 11/27/2018  . DDD (degenerative disc disease), cervical 11/27/2018  . Swelling of left upper eyelid 09/24/2018  . Attention deficit hyperactivity disorder (ADHD), predominantly inattentive type 02/27/2018  . Arthralgia 02/27/2018  . Screening for malignant neoplasm of colon 02/27/2018  . H/O: hysterectomy 02/27/2018  . Hypothyroidism 02/27/2018  . NASH (nonalcoholic steatohepatitis) 02/27/2018  . GAD (generalized anxiety disorder) 01/06/2017  . Moderate episode of recurrent major depressive disorder ([place]) 01/06/2017  . Environmental allergies 09/12/2015  . Essential hypertension 09/12/2015  . OSA on CPAP 09/12/2015   Kerin Perna, PTA 10/22/20 12:09 PM  [place], [place], [address] Phone: [phone]   Fax:  [phone]  Name: Melanie [name] MRN: [id number] Date of Birth: Dec 08, 1965

## 2020-10-23 ENCOUNTER — Encounter: Payer: Self-pay | Admitting: Rehabilitative and Restorative Service Providers"

## 2020-10-26 ENCOUNTER — Ambulatory Visit: Payer: No Typology Code available for payment source | Admitting: Medical-Surgical

## 2020-10-28 ENCOUNTER — Ambulatory Visit: Payer: No Typology Code available for payment source | Admitting: Family Medicine

## 2020-10-28 ENCOUNTER — Encounter: Payer: No Typology Code available for payment source | Admitting: Physical Therapy

## 2020-10-28 NOTE — Progress Notes (Deleted)
   I, [name], LAT, ATC acting as a scribe for Melanie Leader, MD.  Melanie [name] is a 55 y.o. female who presents to [place] at [facility] today for f/u R shoulder and LB pain. Pt previously had cervical epidural steroid injections on 06/23/20 and 05/27/20. Pt was last seen by [name] on 09/24/20 and was given a R subacromial steroid injection and was referred to PT of which she's completed 3 visits. Pt was also given a rheumatologic work-up, which was negative. Today, pt reports  Dx testing: 09/24/20 Labs (ANA titer, Lupus panel, comprehensive metabolic panel, sed rate, cyclic citril peptide antibody, and HLA-B27 antigen)  09/24/20 L-spine XR  04/18/20 R shoulder MRI and R knee MRI  Pertinent review of systems: ***  Relevant historical information: ***   Exam:  There were no vitals taken for this visit. General: Well Developed, well nourished, and in no acute distress.   MSK: ***    Lab and Radiology Results No results found for this or any previous visit (from the past 72 hour(s)). No results found.     Assessment and Plan: 55 y.o. female with ***   PDMP not reviewed this encounter. No orders of the defined types were placed in this encounter.  No orders of the defined types were placed in this encounter.    Discussed warning signs or symptoms. Please see discharge instructions. Patient expresses understanding.   ***

## 2020-11-05 ENCOUNTER — Ambulatory Visit: Payer: No Typology Code available for payment source | Admitting: Family Medicine

## 2020-11-05 ENCOUNTER — Encounter (HOSPITAL_BASED_OUTPATIENT_CLINIC_OR_DEPARTMENT_OTHER): Payer: No Typology Code available for payment source | Admitting: Internal Medicine

## 2020-11-06 ENCOUNTER — Encounter: Payer: No Typology Code available for payment source | Admitting: Physical Therapy

## 2020-11-06 NOTE — Progress Notes (Signed)
I, Melanie [name], LAT, ATC acting as a scribe for Melanie Leader, MD.  Melanie [name] is a 55 y.o. female who presents to [place] at [facility] today for f/u R shoulder and low back pain. Pt was last seen by [name] on 09/24/20 and was given a R subacromial steroid injection and was referred to PT of which she's completed 3 visits. Today, pt reports R shoulder has improved, 60%. Pt low back is pretty painful. Pt locates back pain to the midline. No numbness/tingling noted.  No numbness significant weakness distally.  Pt also c/o R wrist pain ongoing a long time. Pt locates pain along the radial aspect of the wrist and 1st CPC joint. No numbness/tingling noted.   Aggravates: any wrist motions Treatments tried: wrist splint  Dx testing: 09/24/20 L-spine XR  09/24/20 Labs (ANA titer, Lupus panel, comprehensive metabolic panel, sed rate, Cyclic citrul peptide, KJZ-P91 antigen)  Pertinent review of systems: No fevers or chills  Relevant historical information: Patient works as a Equities trader in the PACU.   Exam:  BP (!) 130/98 (BP Location: Right Arm, Patient Position: Sitting, Cuff Size: Normal)   Pulse 88   Ht _0  (1.702 m)   Wt 206 lb 12.8 oz (93.8 kg)   SpO2 99%   BMI 32.39 kg/m  General: Well Developed, well nourished, and in no acute distress.   MSK: Right wrist normal-appearing Mildly tender palpation radial styloid. Normal wrist motion. Mildly positive Finkelstein's test. Intact strength.  L-spine normal-appearing Nontender midline.  Palpation perispinal musculature. Normal lumbar motion. Lower extremity strength is intact.    Lab and Radiology Results  Diagnostic Limited MSK Ultrasound of: Right wrist No ganglion cyst present. Small amount of hypoechoic fluid tracks within the first dorsal wrist compartment tendon sheath however very minimal. No significant or severe tenosynovitis visible dorsal wrist compartments. Impression: Mild  de Quervain tenosynovitis  X-ray images right hand obtained today personally and independently interpreted Minimal degenerative changes hand.  No severe wrist DJD.  No acute fractures. Await formal radiology review   EXAM: LUMBAR SPINE - 2-3 VIEW  COMPARISON:  December 17, 2018  FINDINGS: There is a mild levoscoliosis centered in the midthoracic spine. Moderate to severe disc height loss is noted at the L5-S1 level. There is mild disc height loss throughout the remaining portions of the lumbar spine. There is no acute compression fracture. No significant malalignment. There is facet arthrosis in the lower lumbar segments.  IMPRESSION: Degenerative changes as above. No acute compression fracture or significant malalignment. Mild levoscoliosis centered in the midthoracic spine.   Electronically Signed   By: Constance Holster [name]   On: 09/26/2020 13:05  I, Melanie [name], personally (independently) visualized and performed the interpretation of the images attached in this note.   Assessment and Plan: 55 y.o. female with chronic intermittent right wrist and hand pain.  Patient has de Quervain's tenosynovitis changes mildly.  However she also has some more general wrist dysfunction.  Symptoms are somewhat mild but persistent.  Maximize conservative management with conventional physical therapy is already established and other conservative management strategies including wrist loop about work and Voltaren gel.  If needed could proceed with MRI arthrogram including a steroid injection as part of the arthrogram.  This would probably be diagnostic and would also be therapeutic.  She will let me know.  Low back pain: Also a chronic issue.  Main issue is myofascial spasm and dysfunction although patient does have degenerative changes  on lumbar spine.  Plan for physical therapy.  If not improving next step would be MRI for potential facet injection planning.    PDMP not reviewed this  encounter. Orders Placed This Encounter  Procedures  . [place] LIMITED JOINT SPACE STRUCTURES UP RIGHT(NO LINKED CHARGES)    Standing Status:   Future    Number of Occurrences:   1    Standing Expiration Date:   05/12/2021    Order Specific Question:   Reason for Exam (SYMPTOM  OR DIAGNOSIS REQUIRED)    Answer:   wrist pain    Order Specific Question:   Preferred imaging location?    Answer:   [facility]  . DG Hand Complete Right    Standing Status:   Future    Number of Occurrences:   1    Standing Expiration Date:   11/09/2021    Order Specific Question:   Reason for Exam (SYMPTOM  OR DIAGNOSIS REQUIRED)    Answer:   eval hand pain    Order Specific Question:   Is patient pregnant?    Answer:   No    Order Specific Question:   Preferred imaging location?    Answer:   [facility]  . Ambulatory referral to Physical Therapy    Referral Priority:   Routine    Referral Type:   Physical Medicine    Referral Reason:   Specialty Services Required    Requested Specialty:   Physical Therapy   No orders of the defined types were placed in this encounter.    Discussed warning signs or symptoms. Please see discharge instructions. Patient expresses understanding.   The above documentation has been reviewed and is accurate and complete Melanie [name], [name]

## 2020-11-09 ENCOUNTER — Ambulatory Visit (INDEPENDENT_AMBULATORY_CARE_PROVIDER_SITE_OTHER): Payer: No Typology Code available for payment source | Admitting: Rehabilitative and Restorative Service Providers"

## 2020-11-09 ENCOUNTER — Other Ambulatory Visit: Payer: Self-pay

## 2020-11-09 ENCOUNTER — Other Ambulatory Visit: Payer: Self-pay | Admitting: Medical-Surgical

## 2020-11-09 ENCOUNTER — Ambulatory Visit (INDEPENDENT_AMBULATORY_CARE_PROVIDER_SITE_OTHER): Payer: No Typology Code available for payment source | Admitting: Family Medicine

## 2020-11-09 ENCOUNTER — Ambulatory Visit: Payer: Self-pay

## 2020-11-09 ENCOUNTER — Ambulatory Visit (INDEPENDENT_AMBULATORY_CARE_PROVIDER_SITE_OTHER): Payer: No Typology Code available for payment source

## 2020-11-09 ENCOUNTER — Other Ambulatory Visit (HOSPITAL_COMMUNITY): Payer: Self-pay

## 2020-11-09 VITALS — BP 130/98 | HR 88 | Ht 67.0 in | Wt 206.8 lb

## 2020-11-09 DIAGNOSIS — M25531 Pain in right wrist: Secondary | ICD-10-CM

## 2020-11-09 DIAGNOSIS — M654 Radial styloid tenosynovitis [de Quervain]: Secondary | ICD-10-CM

## 2020-11-09 DIAGNOSIS — I10 Essential (primary) hypertension: Secondary | ICD-10-CM

## 2020-11-09 DIAGNOSIS — M545 Low back pain, unspecified: Secondary | ICD-10-CM

## 2020-11-09 DIAGNOSIS — M6281 Muscle weakness (generalized): Secondary | ICD-10-CM

## 2020-11-09 DIAGNOSIS — M1811 Unilateral primary osteoarthritis of first carpometacarpal joint, right hand: Secondary | ICD-10-CM

## 2020-11-09 DIAGNOSIS — G8929 Other chronic pain: Secondary | ICD-10-CM | POA: Diagnosis not present

## 2020-11-09 DIAGNOSIS — F900 Attention-deficit hyperactivity disorder, predominantly inattentive type: Secondary | ICD-10-CM

## 2020-11-09 MED ORDER — LISDEXAMFETAMINE DIMESYLATE 50 MG PO CAPS
ORAL_CAPSULE | Freq: Every day | ORAL | 0 refills | Status: DC
  Filled 2020-11-09: qty 30, 30d supply, fill #0

## 2020-11-09 MED ORDER — LOSARTAN POTASSIUM 25 MG PO TABS
ORAL_TABLET | Freq: Every day | ORAL | 1 refills | Status: DC
  Filled 2020-11-09: qty 90, 90d supply, fill #0
  Filled 2020-12-15 – 2021-02-10 (×2): qty 90, 90d supply, fill #1

## 2020-11-09 MED FILL — Levocetirizine Dihydrochloride Tab 5 MG: ORAL | 90 days supply | Qty: 90 | Fill #0 | Status: AC

## 2020-11-09 NOTE — Patient Instructions (Addendum)
Thank you for coming in today.  Continue PT.   Thumb loop may be helpful with work. Thumb spica splint may help with heavy duty stuff.   Please use Voltaren gel (Generic Diclofenac Gel) up to 4x daily for pain as needed.  This is available over-the-counter as both the name brand Voltaren gel and the generic diclofenac gel.  Next step for hand is MRI arthrogram.     For back pain plan for PT.  If needed next step is MRI for facet injection planning.   Let me know what you want to do in the future. I am happy to order theses tests at any time.

## 2020-11-09 NOTE — Therapy (Addendum)
[place], [place], [address] Phone: [phone]   Fax:  [phone]  Physical Therapy Treatment/Discharge Summary  Patient Details  Name: Melanie [name] MRN: [id number] Date of Birth: December 19, 1965 Referring Provider (PT): [name], MD  PHYSICAL THERAPY DISCHARGE SUMMARY  Visits from Start of Care: 4  Current functional level related to goals / functional outcomes: See patient's last known status below.   Remaining deficits: See below for last known status.   Education / Equipment: HEP   Patient agrees to discharge. Patient goals were not met. Patient is being discharged due to not returning since the last visit.  Encounter Date: 11/09/2020   PT End of Session - 11/09/20 1438     Visit Number 4    Number of Visits 6    Date for PT Re-Evaluation 11/19/20    PT Start Time 9622    PT Stop Time 1515    PT Time Calculation (min) 41 min    Activity Tolerance Patient tolerated treatment well    Behavior During Therapy [facility] for tasks assessed/performed             Past Medical History:  Diagnosis Date   Arthritis    Bowel obstruction ([place])    Constipation 07/01/2020   Diabetes mellitus without complication ([place])    Diverticulosis    History of prediabetes 02/27/2018   Hypertension    Post-operative nausea and vomiting    Sleep apnea    uses CPAP    Past Surgical History:  Procedure Laterality Date   ABDOMINAL HYSTERECTOMY     COLONOSCOPY  20 years ago    in [place]=diverticulosis    LAPAROSCOPY     due to bowel perforation   MYOMECTOMY     OOPHORECTOMY     unilateral    There were no vitals filed for this visit.   Subjective Assessment - 11/09/20 1434     Subjective The patient has switched jobs.  "I'm doing pretty good except for my low back."  She has mild wrist pain, thoracic spine and shoulder are doing pretty good.    Currently in Pain? Yes    Pain Score 8      Pain Location Back    Pain Orientation Mid;Lower    Pain Descriptors / Indicators Sore;Aching    Pain Onset More than a month ago    Pain Frequency Intermittent    Aggravating Factors  prolonged positions                [facility] PT Assessment - 11/09/20 1440       Assessment   Medical Diagnosis Chronic R shoulder pain, lumbar pain    Referring Provider (PT) [name], MD    Onset Date/Surgical Date 09/24/20    Hand Dominance Right                           [facility] Adult PT Treatment/Exercise - 11/09/20 1440       Exercises   Exercises Lumbar;Knee/Hip;Other Exercises    Other Exercises  wrist isometrics initiated-- patient notes she continues to be aware of her wrist throughout the work day.      Lumbar Exercises: Stretches   Press Ups 10 reps;10 seconds      Lumbar Exercises: Quadruped   [place]/Old Horse 10 reps      Knee/Hip Exercises: Standing   Forward Lunges 10 reps    Forward Lunges  Limitations static lunge with 10 reps of counter rotation    Hip Abduction Stengthening;Both;10 reps    Abduction Limitations sidestepping and then added arms abducted to 90    Lateral Step Up 10 reps    Lateral Step Up Limitations 4" step R and L sides for step downs laterally    Functional Squat 5 reps    SLS 10 second holds R and L sides with postural corrections      Manual Therapy   Manual Therapy Joint mobilization;Soft tissue mobilization    Manual therapy comments to reduce muscle stiffness and joint hypomobility    Joint Mobilization CPA and UPA R lumbar/SI region grade II-III    Soft tissue mobilization lumbar mutifidi STM                    PT Education - 11/09/20 2130     Education Details HEP progression    Person(s) Educated Patient    Methods Demonstration;Explanation;Handout    Comprehension Verbalized understanding;Returned demonstration                 PT Long Term Goals - 10/08/20 1252       PT LONG TERM GOAL #1    Title The patient will be indep with HEP.    Time 6    Period Weeks    Target Date 11/19/20      PT LONG TERM GOAL #2   Title The patient will improve FOTO score from 60% up to 65% to demo improved functional abilities.    Time 6    Period Weeks    Target Date 11/19/20      PT LONG TERM GOAL #3   Title The patient will report resting pain < or equal to 3/10.    Time 6    Period Weeks    Target Date 11/19/20      PT LONG TERM GOAL #4   Title The patient will improve bilateral hip strength to 5/5 and R shoulder strength to 5/5.    Time 6    Period Weeks    Target Date 11/19/20                   Plan - 11/09/20 1512     Clinical Impression Statement The patient reports low back is more painful today.  PT focused session on repeated extension, LE strengthening and progression of HEP.  Plan to continue to progress to patient tolerance.    PT Treatment/Interventions Taping;Patient/family education;ADLs/Self Care Home Management;Therapeutic activities;Therapeutic exercise;Electrical Stimulation;Iontophoresis 57m/ml Dexamethasone;Traction;Moist Heat;Manual techniques;Dry needling    PT Next Visit Plan Continue to progress to L[place]working on strengthening to increase tolerance to load for work.    PT Home Exercise Plan [facility]   Consulted and Agree with Plan of Care Patient             Patient will benefit from skilled therapeutic intervention in order to improve the following deficits and impairments:     Visit Diagnosis: Chronic right-sided low back pain without sciatica  Muscle weakness (generalized)     Problem List Patient Active Problem List   Diagnosis Date Noted   Tenosynovitis of right wrist fourth extensor 06/16/2020   SBO ([place] bowel obstruction) ([place] 04/29/2020   HLD (hyperlipidemia) 04/29/2020   Type 2 diabetes mellitus without complication, without long-term current use of insulin ([place] 04/24/2020   Acute cystitis without  hematuria 06/24/2019   Tenosynovitis, de Quervain 06/24/2019  Primary osteoarthritis of first carpometacarpal joint of right hand 03/18/2019   Chronic right shoulder pain 03/18/2019   Bilateral foot pain 03/18/2019   Primary osteoarthritis of both knees 03/18/2019   Fibromyalgia 03/15/2019   Lumbar degenerative disc disease 12/19/2018   Myalgia 11/27/2018   Morning stiffness of joints 11/27/2018   DDD (degenerative disc disease), cervical 11/27/2018   Swelling of left upper eyelid 09/24/2018   Attention deficit hyperactivity disorder (ADHD), predominantly inattentive type 02/27/2018   Arthralgia 02/27/2018   Screening for malignant neoplasm of colon 02/27/2018   H/O: hysterectomy 02/27/2018   Hypothyroidism 02/27/2018   NASH (nonalcoholic steatohepatitis) 02/27/2018   GAD (generalized anxiety disorder) 01/06/2017   Moderate episode of recurrent major depressive disorder ([place]) 01/06/2017   Environmental allergies 09/12/2015   Essential hypertension 09/12/2015   OSA on CPAP 09/12/2015    Kalub Morillo , PT 11/09/2020, 9:34 PM  [facility] [place], [place], [address] Phone: [phone]   Fax:  [phone]  Name: Melanie [name] MRN: [id number] Date of Birth: Mar 30, 1966

## 2020-11-09 NOTE — Patient Instructions (Signed)
Access Code: [facility] URL: [email or web address] Date: 11/09/2020 Prepared by: Rudell Cobb  Exercises Doorway Pec Stretch at 60 Elevation - 2 x daily - 7 x weekly - 1 sets - 3 reps - 30 s econds hold Standing Scapular Retraction in Abduction - 2 x daily - 7 x weekly - 1 sets - 10 reps Standing Bilateral Low Shoulder Row with Anchored Resistance - 2 x daily - 7 x weekly - 1 sets - 10 reps Standing with Forearms Thoracic Rotation - 2 x daily - 7 x weekly - 1 sets - 10 reps Wall Quarter Squat - 1 x daily - 7 x weekly - 1 sets - 10 reps Supine Bridge - 2 x daily - 7 x weekly - 1 sets - 10 reps - 5 seconds hold Sidelying Thoracic Rotation with Open Book - 1 x daily - 7 x weekly - 3 sets - 10 reps Prone Press Up - 2 x daily - 7 x weekly - 1 sets - 10 reps - 10 seconds hold Cat-Camel - 2 x daily - 7 x weekly - 1 sets - 10 reps sidelying low back stretch - 2 x daily - 7 x weekly - 1 sets - 3 reps - 30 seconds hold Isometric Wrist Extension Neutral - 2 x daily - 7 x weekly - 1 sets - 5 reps - 5 seconds hold

## 2020-11-10 NOTE — Progress Notes (Signed)
Right hand x-ray shows some mild arthritis of the thumb

## 2020-11-16 ENCOUNTER — Encounter: Payer: Self-pay | Admitting: Rehabilitative and Restorative Service Providers"

## 2020-11-16 ENCOUNTER — Encounter: Payer: No Typology Code available for payment source | Admitting: Rehabilitative and Restorative Service Providers"

## 2020-11-26 ENCOUNTER — Ambulatory Visit (HOSPITAL_BASED_OUTPATIENT_CLINIC_OR_DEPARTMENT_OTHER): Payer: No Typology Code available for payment source | Attending: Medical-Surgical | Admitting: Internal Medicine

## 2020-11-26 ENCOUNTER — Other Ambulatory Visit: Payer: Self-pay

## 2020-11-26 VITALS — Ht 67.0 in | Wt 207.0 lb

## 2020-11-26 DIAGNOSIS — G4733 Obstructive sleep apnea (adult) (pediatric): Secondary | ICD-10-CM | POA: Insufficient documentation

## 2020-11-26 DIAGNOSIS — Z9989 Dependence on other enabling machines and devices: Secondary | ICD-10-CM | POA: Diagnosis not present

## 2020-11-27 ENCOUNTER — Encounter: Payer: No Typology Code available for payment source | Admitting: Rehabilitative and Restorative Service Providers"

## 2020-11-27 ENCOUNTER — Encounter: Payer: Self-pay | Admitting: Rehabilitative and Restorative Service Providers"

## 2020-12-05 DIAGNOSIS — G4733 Obstructive sleep apnea (adult) (pediatric): Secondary | ICD-10-CM | POA: Diagnosis not present

## 2020-12-05 DIAGNOSIS — Z9989 Dependence on other enabling machines and devices: Secondary | ICD-10-CM

## 2020-12-05 NOTE — Procedures (Signed)
    Patient Name: Melanie [name], Melanie [name] Date: 11/26/2020 Gender: Female D.O.B: [birth year] Age (years): 38 Referring Provider: Samuel Bouche NP Height (inches): 67 Interpreting Physician: Baird Lyons MD, ABSM Weight (lbs): 207 RPSGT: Laren Everts BMI: 32 MRN: [id number] Neck Size: 15.00  CLINICAL INFORMATION The patient is referred for a CPAP titration to treat sleep apnea.  Date of NPSG, Split Night or HST:  NPSG 06/22/20  AHI 11.8/ hr, desaturation to 87%, body weight 219 lbs  SLEEP STUDY TECHNIQUE As per the AASM Manual for the Scoring of Sleep and Associated Events v2.3 (April 2016) with a hypopnea requiring 4% desaturations.  The channels recorded and monitored were frontal, central and occipital EEG, electrooculogram (EOG), submentalis EMG (chin), nasal and oral airflow, thoracic and abdominal wall motion, anterior tibialis EMG, snore microphone, electrocardiogram, and pulse oximetry. Continuous positive airway pressure (CPAP) was initiated at the beginning of the study and titrated to treat sleep-disordered breathing.  MEDICATIONS Medications self-administered by patient taken the night of the study : ASPIRIN, CELEBREX, LEVOCETIRIZINE, AMITRIPTYLINE, LOSARTAN  TECHNICIAN COMMENTS Comments added by technician: NONE Comments added by scorer: N/A RESPIRATORY PARAMETERS Optimal PAP Pressure (cm): 11 AHI at Optimal Pressure (/hr): 0 Overall Minimal O2 (%): 91.0 Supine % at Optimal Pressure (%): 4 Minimal O2 at Optimal Pressure (%): 93.0   SLEEP ARCHITECTURE The study was initiated at 11:04:32 PM and ended at 5:19:09 AM.  Sleep onset time was 12.9 minutes and the sleep efficiency was 90.6%%. The total sleep time was 339.5 minutes.  The patient spent 6.0%% of the night in stage N1 sleep, 73.5%% in stage N2 sleep, 1.3%% in stage N3 and 19.2% in REM.Stage REM latency was 70.5 minutes  Wake after sleep onset was 22.2. Alpha intrusion was absent. Supine sleep was  22.68%.  CARDIAC DATA The 2 lead EKG demonstrated sinus rhythm. The mean heart rate was 72.9 beats per minute. Other EKG findings include: None.  LEG MOVEMENT DATA The total Periodic Limb Movements of Sleep (PLMS) were 0. The PLMS index was 0.0. A PLMS index of <15 is considered normal in adults.  IMPRESSIONS - The optimal PAP pressure was 11 cm of water. - Central sleep apnea was not noted during this titration (CAI = 0.2/h). - Significant oxygen desaturations were not observed during this titration (min O2 = 91.0%). - The patient snored with soft snoring volume during this titration study. - No cardiac abnormalities were observed during this study. - Clinically significant periodic limb movements were not noted during this study.   DIAGNOSIS - Obstructive Sleep Apnea (G47.33)  RECOMMENDATIONS - Trial of CPAP therapy on 11 cm H2O or autopap 5-15. - Patient used a Small size Philips Respironics Nasal Mask DreamWear mask and heated humidification. - Be careful with alcohol, sedatives and other CNS depressants that may worsen sleep apnea and disrupt normal sleep architecture. - Sleep hygiene should be reviewed to assess factors that may improve sleep quality. - Weight management and regular exercise should be initiated or continued.  [Electronically signed] 12/05/2020 12:10 PM  Baird Lyons MD, [place], American Board of Sleep Medicine   NPI: [id number]                         [place], [place] [facility]  ELECTRONICALLY SIGNED ON:  12/05/2020, 12:07 PM [place] PH: [phone]   FX: [phone] Page

## 2020-12-11 ENCOUNTER — Other Ambulatory Visit: Payer: Self-pay | Admitting: Family Medicine

## 2020-12-11 ENCOUNTER — Other Ambulatory Visit: Payer: Self-pay

## 2020-12-11 ENCOUNTER — Ambulatory Visit: Payer: Self-pay

## 2020-12-11 DIAGNOSIS — M79645 Pain in left finger(s): Secondary | ICD-10-CM

## 2020-12-15 ENCOUNTER — Other Ambulatory Visit: Payer: Self-pay | Admitting: Medical-Surgical

## 2020-12-15 ENCOUNTER — Other Ambulatory Visit (HOSPITAL_COMMUNITY): Payer: Self-pay

## 2020-12-15 ENCOUNTER — Encounter: Payer: Self-pay | Admitting: Medical-Surgical

## 2020-12-15 DIAGNOSIS — Z9109 Other allergy status, other than to drugs and biological substances: Secondary | ICD-10-CM

## 2020-12-15 DIAGNOSIS — F900 Attention-deficit hyperactivity disorder, predominantly inattentive type: Secondary | ICD-10-CM

## 2020-12-15 DIAGNOSIS — F331 Major depressive disorder, recurrent, moderate: Secondary | ICD-10-CM

## 2020-12-15 MED ORDER — LEVOCETIRIZINE DIHYDROCHLORIDE 5 MG PO TABS
ORAL_TABLET | Freq: Every evening | ORAL | 1 refills | Status: DC
  Filled 2020-12-15 – 2021-02-10 (×2): qty 90, 90d supply, fill #0
  Filled 2021-04-28: qty 90, 90d supply, fill #1

## 2020-12-15 MED ORDER — BUPROPION HCL ER (XL) 150 MG PO TB24
ORAL_TABLET | Freq: Every day | ORAL | 0 refills | Status: DC
  Filled 2020-12-15: qty 180, 90d supply, fill #0

## 2020-12-15 MED ORDER — LISDEXAMFETAMINE DIMESYLATE 50 MG PO CAPS: ORAL_CAPSULE | Freq: Every day | ORAL | 0 refills | Status: DC

## 2020-12-15 MED ORDER — AMITRIPTYLINE HCL 10 MG PO TABS
ORAL_TABLET | ORAL | 0 refills | Status: DC
  Filled 2020-12-15: qty 90, 45d supply, fill #0

## 2020-12-15 MED FILL — Celecoxib Cap 200 MG: ORAL | 90 days supply | Qty: 180 | Fill #0 | Status: AC

## 2020-12-15 MED FILL — Ipratropium Bromide Nasal Soln 0.03% (21 MCG/SPRAY): NASAL | 43 days supply | Qty: 30 | Fill #0 | Status: AC

## 2020-12-16 ENCOUNTER — Other Ambulatory Visit (HOSPITAL_COMMUNITY): Payer: Self-pay

## 2020-12-17 ENCOUNTER — Other Ambulatory Visit (HOSPITAL_COMMUNITY): Payer: Self-pay

## 2020-12-17 MED ORDER — LISDEXAMFETAMINE DIMESYLATE 50 MG PO CAPS
ORAL_CAPSULE | Freq: Every day | ORAL | 0 refills | Status: DC
  Filled 2020-12-17: qty 30, 30d supply, fill #0

## 2020-12-17 NOTE — Telephone Encounter (Signed)
Please call [facility] in [place] to cancel the Vyvanse prescription that was sent there erroneously yesterday. This has already been resent to [facility].  Clearnce Sorrel, DNP, APRN, FNP-BC [facility]

## 2020-12-17 NOTE — Telephone Encounter (Signed)
Cancelled prescription.

## 2020-12-18 ENCOUNTER — Telehealth (INDEPENDENT_AMBULATORY_CARE_PROVIDER_SITE_OTHER): Payer: No Typology Code available for payment source | Admitting: Medical-Surgical

## 2020-12-18 ENCOUNTER — Encounter: Payer: Self-pay | Admitting: Medical-Surgical

## 2020-12-18 DIAGNOSIS — E669 Obesity, unspecified: Secondary | ICD-10-CM

## 2020-12-18 MED ORDER — WEGOVY 1.7 MG/0.75ML ~~LOC~~ SOAJ: 1.7000 mg | SUBCUTANEOUS | 5 refills | Status: DC

## 2020-12-18 NOTE — Progress Notes (Signed)
Virtual Visit via Video Note  I connected with Melanie [name] on 12/18/20 at  3:40 PM EDT by a video enabled telemedicine application and verified that I am speaking with the correct person using two identifiers.   I discussed the limitations of evaluation and management by telemedicine and the availability of in person appointments. The patient expressed understanding and agreed to proceed.  Patient location: home Provider locations: office  Subjective:    CC: Discuss weight loss options  HPI: Pleasant 55 year old female presenting via [place] video visit to discuss weight loss therapy options.  She previously completed 6 months of Wegovy.  Her last injection was approximately 1.5 weeks ago.  After moving up to the 2.4 mg dose, she did note some nausea and malaise following the injections for about 24 hours.  After 6 months of therapy, she lost around 15 pounds.  Notes that she was not giving her best effort to weight loss but she still managed to lose some.  She is doing some intermittent exercise, mainly walking.  She has been eating smaller portions due to appetite suppression so her caloric intake is reduced.  She did like using Wegovy and would like to continue if not possible.  She has completed her sleep study and is wanting to get a new CPAP.  She will check with her insurance to see what DME company is preferred.  Blood pressure has been running a little higher than usual lately and she is going to continue to monitor.  She says she will send me a message via MyChart with some specific numbers soon.  Past medical history, Surgical history, Family history not pertinant except as noted below, Social history, Allergies, and medications have been entered into the medical record, reviewed, and corrections made.   Review of Systems: See HPI for pertinent positives and negatives.   Objective:    General: Speaking clearly in complete sentences without any shortness of breath.   Alert and oriented x3.  Normal judgment. No apparent acute distress.  Impression and Recommendations:    1. Obesity (BMI 30.0-34.9) Some insurances are covering Wegovy so sending in the 1.7 mg weekly dose to avoid negative side effects.  If this is not approved, believe Kirke Shaggy would be the best option since she is diabetic and has some definitive insulin resistance.  I discussed the assessment and treatment plan with the patient. The patient was provided an opportunity to ask questions and all were answered. The patient agreed with the plan and demonstrated an understanding of the instructions.   The patient was advised to call back or seek an in-person evaluation if the symptoms worsen or if the condition fails to improve as anticipated.  20 minutes of non-face-to-face time was provided during this encounter.  Return if symptoms worsen or fail to improve.  Clearnce Sorrel, DNP, APRN, FNP-BC [facility]

## 2021-01-20 ENCOUNTER — Encounter: Payer: Self-pay | Admitting: Medical-Surgical

## 2021-01-20 ENCOUNTER — Telehealth (INDEPENDENT_AMBULATORY_CARE_PROVIDER_SITE_OTHER): Payer: No Typology Code available for payment source | Admitting: Medical-Surgical

## 2021-01-20 DIAGNOSIS — U071 COVID-19: Secondary | ICD-10-CM | POA: Diagnosis not present

## 2021-01-20 MED ORDER — GUAIFENESIN-CODEINE 100-10 MG/5ML PO SYRP: 5.0000 mL | ORAL_SOLUTION | Freq: Three times a day (TID) | ORAL | 0 refills | Status: DC | PRN

## 2021-01-20 MED ORDER — NIRMATRELVIR/RITONAVIR (PAXLOVID)TABLET: 3.0000 | ORAL_TABLET | Freq: Two times a day (BID) | ORAL | 0 refills | Status: AC

## 2021-01-20 NOTE — Telephone Encounter (Signed)
Pt agreeable to scheduling a VV this afternoon at 4:20.

## 2021-01-20 NOTE — Progress Notes (Signed)
Virtual Visit via Video Note  I connected with Melanie [name] on 01/20/21 at  4:20 PM EDT by a video enabled telemedicine application and verified that I am speaking with the correct person using two identifiers.   I discussed the limitations of evaluation and management by telemedicine and the availability of in person appointments. The patient expressed understanding and agreed to proceed.  Patient location: home Provider locations: office  Subjective:    CC: COVID-positive  HPI: Pleasant 55 year old female presenting via MyChart video visit with reports of testing positive for COVID using 2 different home test last night.  She developed symptoms approximately 2 days ago including fever T-max 100.2, body aches, nonproductive cough, sinus congestion, fatigue, and intermittent sore throat.  Feels that her sore throat is were more related to postnasal drip.  She does have chest burning when she coughs.  She has tried multiple over-the-counter agents to help with symptom management.  Her cough is strong and deep and is interfering with her ability to sleep at night.  Past medical history, Surgical history, Family history not pertinant except as noted below, Social history, Allergies, and medications have been entered into the medical record, reviewed, and corrections made.   Review of Systems: See HPI for pertinent positives and negatives.   Objective:    General: Speaking clearly in complete sentences without any shortness of breath.  Alert and oriented x3.  Normal judgment. No apparent acute distress.  Impression and Recommendations:    1. COVID-19 virus infection Discussed options for treating COVID-19 using oral antivirals. Start Paxlovid twice daily x5 days.  Continue symptomatic treatment with over-the-counter medications.  Okay to use albuterol inhaler as needed.  Sending in Robitussin-AC to help with nighttime coughing.  Reviewed CDC quarantine recommendations as well as  emergency symptoms that should prompt evaluation at the nearest urgent care/ED.  I discussed the assessment and treatment plan with the patient. The patient was provided an opportunity to ask questions and all were answered. The patient agreed with the plan and demonstrated an understanding of the instructions.   The patient was advised to call back or seek an in-person evaluation if the symptoms worsen or if the condition fails to improve as anticipated.  20 minutes of non-face-to-face time was provided during this encounter.  Return if symptoms worsen or fail to improve.  Clearnce Sorrel, DNP, APRN, FNP-BC [facility]

## 2021-01-21 ENCOUNTER — Encounter: Payer: Self-pay | Admitting: Medical-Surgical

## 2021-01-22 ENCOUNTER — Other Ambulatory Visit (HOSPITAL_COMMUNITY): Payer: Self-pay

## 2021-01-22 ENCOUNTER — Telehealth (INDEPENDENT_AMBULATORY_CARE_PROVIDER_SITE_OTHER): Payer: No Typology Code available for payment source | Admitting: Medical-Surgical

## 2021-01-22 ENCOUNTER — Encounter: Payer: Self-pay | Admitting: Medical-Surgical

## 2021-01-22 DIAGNOSIS — F900 Attention-deficit hyperactivity disorder, predominantly inattentive type: Secondary | ICD-10-CM | POA: Diagnosis not present

## 2021-01-22 DIAGNOSIS — R300 Dysuria: Secondary | ICD-10-CM

## 2021-01-22 DIAGNOSIS — G4733 Obstructive sleep apnea (adult) (pediatric): Secondary | ICD-10-CM

## 2021-01-22 DIAGNOSIS — H9201 Otalgia, right ear: Secondary | ICD-10-CM | POA: Diagnosis not present

## 2021-01-22 DIAGNOSIS — T7840XA Allergy, unspecified, initial encounter: Secondary | ICD-10-CM

## 2021-01-22 DIAGNOSIS — E669 Obesity, unspecified: Secondary | ICD-10-CM | POA: Diagnosis not present

## 2021-01-22 DIAGNOSIS — Z9989 Dependence on other enabling machines and devices: Secondary | ICD-10-CM

## 2021-01-22 MED ORDER — LISDEXAMFETAMINE DIMESYLATE 60 MG PO CAPS
60.0000 mg | ORAL_CAPSULE | Freq: Every day | ORAL | 0 refills | Status: DC
  Filled 2021-01-22: qty 30, 30d supply, fill #0

## 2021-01-22 MED ORDER — EPINEPHRINE 0.3 MG/0.3ML IJ SOAJ
0.3000 mg | Freq: Once | INTRAMUSCULAR | 0 refills | Status: AC
  Filled 2021-01-22: qty 2, 2d supply, fill #0

## 2021-01-22 NOTE — Progress Notes (Signed)
Covid positive on Tuesday.

## 2021-01-22 NOTE — Progress Notes (Signed)
Virtual Visit via Video Note  I connected with Melanie [name] on 01/22/21 at  2:40 PM EDT by a video enabled telemedicine application and verified that I am speaking with the correct person using two identifiers.   I discussed the limitations of evaluation and management by telemedicine and the availability of in person appointments. The patient expressed understanding and agreed to proceed.  Patient location: home Provider locations: office  Subjective:    CC: Various concerns  HPI: Pleasant 55 year old female presenting today for discussion of various concerns.  She was tested on Tuesday and found to be positive for COVID.  She is doing some better with her symptoms and is tolerating the oral antiviral very well.  Sleep study was completed and she is in need of a new CPAP since her old one is no longer functioning well.  She would like to have this sent to [facility] as they are one of the 2 companies that work with her insurance.  She is getting restarted on Wegovy.  She has done 1 shot so far but skipped the other 1 when she got sick with COVID.  She is very happy to be restarting this and will keep [place] updated on her progress. ADHD-she is taking Vyvanse 50 mg daily but feels that it may have the option to help a little bit better.  Previous took Adderall but had crashes in the afternoon.  Notes the Vyvanse is better in this aspect.  She does have chronic sleeping issues but it is no worse than usual.  She notes that she has been on a very bad sleeping schedule lately.  She would like to try increasing her Vyvanse to 60 mg daily to see if it helps her attention better throughout the afternoon.  She was having significant urinary issues between dysuria and atrophic vaginitis but notes that this has resolved and gotten much better since she had her job change.  A few days ago, she had some right ear pain.  She did treat with some drops but found a knot that had developed behind her  ear and that was painful.  Notes that this is gotten smaller and less painful over the last couple of days and so she will monitor it for now.  If it does not resolve she has plans to come into the office for evaluation.  She was stung by an insect outside and notes that she has significant swelling and redness on her leg.  Pictures are available in the chart under patient message.  Her father also had allergies to stinging insects and had to carry an EpiPen.  She would like to have an EpiPen on hand just in case although she has never had any swelling of the lips, throat, mouth, or airway complications.   Past medical history, Surgical history, Family history not pertinant except as noted below, Social history, Allergies, and medications have been entered into the medical record, reviewed, and corrections made.   Review of Systems: See HPI for pertinent positives and negatives.   Objective:    General: Speaking clearly in complete sentences without any shortness of breath.  Alert and oriented x3.  Normal judgment. No apparent acute distress.  Impression and Recommendations:    1. Attention deficit hyperactivity disorder (ADHD), predominantly inattentive type Increasing Vyvanse to 60 mg daily.  If this does not work well for her or she does not tolerate it, advised her to return her unused medication to the pharmacy for verification.  At that point, we can send in the 50 mg dose. - lisdexamfetamine (VYVANSE) 60 MG capsule; Take 1 capsule (60 mg total) by mouth daily.  Dispense: 30 capsule; Refill: 0  2. Obesity (BMI 30.0-34.9) Continue Wegovy as prescribed.  3. OSA on CPAP Sending new CPAP prescription to [facility].  4. Right ear pain Continue to monitor right ear pain.  If this does not resolve over the next few days, come into the office for further evaluation.  5. Allergic reaction, initial encounter Sending an EpiPen for as needed use  6. Dysuria Resolved.  I discussed the  assessment and treatment plan with the patient. The patient was provided an opportunity to ask questions and all were answered. The patient agreed with the plan and demonstrated an understanding of the instructions.   The patient was advised to call back or seek an in-person evaluation if the symptoms worsen or if the condition fails to improve as anticipated.  25 minutes of non-face-to-face time was provided during this encounter.  Return in about 4 weeks (around 02/19/2021) for ADHD follow up.  Clearnce Sorrel, DNP, APRN, FNP-BC [facility]

## 2021-01-23 ENCOUNTER — Other Ambulatory Visit (HOSPITAL_COMMUNITY): Payer: Self-pay

## 2021-01-29 ENCOUNTER — Other Ambulatory Visit: Payer: Self-pay | Admitting: Medical-Surgical

## 2021-01-29 ENCOUNTER — Other Ambulatory Visit (HOSPITAL_COMMUNITY): Payer: Self-pay

## 2021-01-29 MED ORDER — AMITRIPTYLINE HCL 10 MG PO TABS
ORAL_TABLET | ORAL | 0 refills | Status: DC
  Filled 2021-01-29: qty 90, 45d supply, fill #0

## 2021-01-29 MED ORDER — CELECOXIB 200 MG PO CAPS
ORAL_CAPSULE | ORAL | 0 refills | Status: DC
  Filled 2021-01-29 – 2021-04-17 (×2): qty 180, 90d supply, fill #0

## 2021-02-10 ENCOUNTER — Other Ambulatory Visit (HOSPITAL_COMMUNITY): Payer: Self-pay

## 2021-02-12 ENCOUNTER — Encounter: Payer: Self-pay | Admitting: Medical-Surgical

## 2021-03-09 ENCOUNTER — Encounter: Payer: Self-pay | Admitting: Medical-Surgical

## 2021-03-09 ENCOUNTER — Other Ambulatory Visit: Payer: Self-pay | Admitting: Medical-Surgical

## 2021-03-09 ENCOUNTER — Other Ambulatory Visit (HOSPITAL_COMMUNITY): Payer: Self-pay

## 2021-03-09 DIAGNOSIS — F900 Attention-deficit hyperactivity disorder, predominantly inattentive type: Secondary | ICD-10-CM

## 2021-03-09 DIAGNOSIS — Z9109 Other allergy status, other than to drugs and biological substances: Secondary | ICD-10-CM

## 2021-03-09 MED ORDER — IPRATROPIUM BROMIDE 0.03 % NA SOLN
NASAL | 2 refills | Status: DC
  Filled 2021-03-09: qty 30, 30d supply, fill #0
  Filled 2021-04-02: qty 30, 30d supply, fill #1
  Filled 2021-04-28: qty 30, 30d supply, fill #2

## 2021-03-09 MED ORDER — LISDEXAMFETAMINE DIMESYLATE 60 MG PO CAPS: 60.0000 mg | ORAL_CAPSULE | Freq: Every day | ORAL | 0 refills | Status: DC

## 2021-03-09 NOTE — Telephone Encounter (Signed)
Last RX was 01/22/21 for #30.  Charyl Bigger, CMA

## 2021-03-10 ENCOUNTER — Other Ambulatory Visit (HOSPITAL_COMMUNITY): Payer: Self-pay

## 2021-03-10 MED ORDER — LISDEXAMFETAMINE DIMESYLATE 60 MG PO CAPS
60.0000 mg | ORAL_CAPSULE | Freq: Every day | ORAL | 0 refills | Status: DC
  Filled 2021-03-10: qty 30, 30d supply, fill #0

## 2021-03-10 NOTE — Addendum Note (Signed)
Addended by: Fonnie Mu on: 03/10/2021 09:09 AM   Modules accepted: Orders

## 2021-03-10 NOTE — Telephone Encounter (Signed)
Previous RX for Vyvanse sent to [facility] canceled (spoke with Chancy Milroy).  Please resend RX to [facility].  Charyl Bigger, CMA

## 2021-04-02 ENCOUNTER — Other Ambulatory Visit (HOSPITAL_COMMUNITY): Payer: Self-pay

## 2021-04-02 ENCOUNTER — Ambulatory Visit (INDEPENDENT_AMBULATORY_CARE_PROVIDER_SITE_OTHER): Payer: No Typology Code available for payment source | Admitting: Family Medicine

## 2021-04-02 ENCOUNTER — Other Ambulatory Visit: Payer: Self-pay

## 2021-04-02 ENCOUNTER — Ambulatory Visit: Payer: Self-pay

## 2021-04-02 ENCOUNTER — Other Ambulatory Visit: Payer: Self-pay | Admitting: Medical-Surgical

## 2021-04-02 ENCOUNTER — Encounter: Payer: Self-pay | Admitting: Family Medicine

## 2021-04-02 VITALS — BP 140/88 | HR 75 | Ht 67.0 in | Wt 215.8 lb

## 2021-04-02 DIAGNOSIS — M5412 Radiculopathy, cervical region: Secondary | ICD-10-CM

## 2021-04-02 DIAGNOSIS — M25511 Pain in right shoulder: Secondary | ICD-10-CM

## 2021-04-02 DIAGNOSIS — G8929 Other chronic pain: Secondary | ICD-10-CM

## 2021-04-02 DIAGNOSIS — M25531 Pain in right wrist: Secondary | ICD-10-CM | POA: Diagnosis not present

## 2021-04-02 DIAGNOSIS — F900 Attention-deficit hyperactivity disorder, predominantly inattentive type: Secondary | ICD-10-CM

## 2021-04-02 MED ORDER — TIZANIDINE HCL 4 MG PO TABS
4.0000 mg | ORAL_TABLET | Freq: Three times a day (TID) | ORAL | 1 refills | Status: DC | PRN
  Filled 2021-04-02: qty 60, 20d supply, fill #0
  Filled 2021-04-28: qty 60, 20d supply, fill #1

## 2021-04-02 MED ORDER — LISDEXAMFETAMINE DIMESYLATE 60 MG PO CAPS
60.0000 mg | ORAL_CAPSULE | Freq: Every day | ORAL | 0 refills | Status: DC
  Filled 2021-04-02 – 2021-04-07 (×2): qty 30, 30d supply, fill #0

## 2021-04-02 NOTE — Addendum Note (Signed)
Addended by: Fonnie Mu on: 04/02/2021 01:05 PM   Modules accepted: Orders

## 2021-04-02 NOTE — Patient Instructions (Addendum)
Thank you for coming in today.   Do those home exercises.   Use heat.   Try the tizanidine at bedtime as needed.   If not improving next step could be epidural steroid injection.  Let me know and I can order it.

## 2021-04-02 NOTE — Progress Notes (Signed)
I, [name], LAT, ATC acting as a scribe for [name], MD.  Melanie [name] is a 55 y.o. female who presents to [facility] [facility] today for R shoulder/arm pain. Pt was last seen by [name] on 11/09/20 and the visit focused mostly on her chronic intermittent R wrist and hand pain. Today, pt reports she has been wearing night splint which she thinks is helping. Pt reports pain was really really bad yesterday. Pt is R-hand dominate. Pt has switched jobs and works on the [employer]. Pt locates pain to all over R [place] joint w/ radiating pain all the way down to R-hand. Pt notes stiffness through her neck and R trap. Pt reports prior R shoulder steroid injections have been helpful.   Pertinent review of systems: No fevers or chills  Relevant historical information: History of cervical radiculopathy   Exam:  BP 140/88   Pulse 75   Ht 5' 7"  (1.702 m)   Wt 215 lb 12.8 oz (97.9 kg)   SpO2 100%   BMI 33.80 kg/m  General: Well Developed, well nourished, and in no acute distress.   MSK: C-spine normal. Nontender midline. To palpation right trapezius. Right shoulder normal-appearing Pain with abduction. Normal motion. Intact strength. Positive impingement testing.  Left wrist mild swelling dorsal wrist.    Lab and Radiology Results  Procedure: Real-time Ultrasound Guided Injection of right shoulder subacromial bursa Device: Philips Affiniti 50G Images permanently stored and available for review in PACS Ultrasound evaluation prior to injection reveals intact rotator cuff tendons with moderate subacromial bursitis. Verbal informed consent obtained.  Discussed risks and benefits of procedure. Warned about infection bleeding damage to structures skin hypopigmentation and fat atrophy among others. Patient expresses understanding and agreement Time-out conducted.   Noted no overlying erythema, induration, or other signs of local infection.   Skin  prepped in a sterile fashion.   Local anesthesia: Topical Ethyl chloride.   With sterile technique and under real time ultrasound guidance: 2 mg of Kenalog and 2 mL of lidocaine injected into subacromial bursa. Fluid seen entering the bursa.   Completed without difficulty   Pain immediately resolved suggesting accurate placement of the medication.   Advised to call if fevers/chills, erythema, induration, drainage, or persistent bleeding.   Images permanently stored and available for review in the ultrasound unit.  Impression: Technically successful ultrasound guided injection.    Quick look ultrasound left dorsal wrist reveals area of swelling to be associated with fourth dorsal wrist compartment.     Assessment and Plan: 55 y.o. female with right shoulder pain primarily thought to be due to subacromial bursitis and impingement.  She also has trapezius muscle dysfunction and spasm.  Plan for subacromial injection and home exercise program.  Additionally recommend tizanidine prescribed today and heating pad and TENS unit.  Addition she may also be developing recurrent cervical radiculopathy on the right side.  She had some benefit in the past with epidural steroid injections.  If her symptoms do not sufficiently resolved she will let me know and I will reorder an epidural steroid injection to [facility] imaging.     PDMP not reviewed this encounter. Orders Placed This Encounter  Procedures   [place] LIMITED JOINT SPACE STRUCTURES UP RIGHT(NO LINKED CHARGES)    Standing Status:   Future    Number of Occurrences:   1    Standing Expiration Date:   09/30/2021    Order Specific Question:   Reason for Exam (  SYMPTOM  OR DIAGNOSIS REQUIRED)    Answer:   right shoulder pain    Order Specific Question:   Preferred imaging location?    Answer:   [facility]   Meds ordered this encounter  Medications   tiZANidine (ZANAFLEX) 4 MG tablet    Sig: Take 1 tablet (4 mg total)  by mouth every 8 (eight) hours as needed for muscle spasms.    Dispense:  60 tablet    Refill:  1     Discussed warning signs or symptoms. Please see discharge instructions. Patient expresses understanding.   Test

## 2021-04-03 ENCOUNTER — Other Ambulatory Visit (HOSPITAL_COMMUNITY): Payer: Self-pay

## 2021-04-05 NOTE — Telephone Encounter (Signed)
Pt called & would like to go ahead and have a C7-T1 epidural.

## 2021-04-07 ENCOUNTER — Other Ambulatory Visit (HOSPITAL_COMMUNITY): Payer: Self-pay

## 2021-04-09 ENCOUNTER — Other Ambulatory Visit (HOSPITAL_COMMUNITY): Payer: Self-pay

## 2021-04-17 ENCOUNTER — Other Ambulatory Visit: Payer: Self-pay | Admitting: Medical-Surgical

## 2021-04-17 DIAGNOSIS — F331 Major depressive disorder, recurrent, moderate: Secondary | ICD-10-CM

## 2021-04-19 ENCOUNTER — Ambulatory Visit
Admission: RE | Admit: 2021-04-19 | Discharge: 2021-04-19 | Disposition: A | Discharge status: Home / Self Care | Payer: No Typology Code available for payment source | Source: Ambulatory Visit | Attending: Family Medicine | Admitting: Family Medicine

## 2021-04-19 ENCOUNTER — Other Ambulatory Visit: Payer: Self-pay

## 2021-04-19 ENCOUNTER — Other Ambulatory Visit (HOSPITAL_COMMUNITY): Payer: Self-pay

## 2021-04-19 DIAGNOSIS — M5412 Radiculopathy, cervical region: Secondary | ICD-10-CM

## 2021-04-19 MED ORDER — IOPAMIDOL (ISOVUE-M 300) INJECTION 61%
1.0000 mL | Freq: Once | INTRAMUSCULAR | Status: AC | PRN
  Administered 2021-04-19: 1 mL via EPIDURAL

## 2021-04-19 MED ORDER — BUPROPION HCL ER (XL) 150 MG PO TB24
ORAL_TABLET | Freq: Every day | ORAL | 0 refills | Status: DC
  Filled 2021-04-19: qty 180, 90d supply, fill #0

## 2021-04-19 MED ORDER — TRIAMCINOLONE ACETONIDE 40 MG/ML IJ SUSP (RADIOLOGY)
60.0000 mg | Freq: Once | INTRAMUSCULAR | Status: AC
  Administered 2021-04-19: 60 mg via EPIDURAL

## 2021-04-19 NOTE — Discharge Instructions (Signed)

## 2021-04-26 ENCOUNTER — Encounter: Payer: Self-pay | Admitting: Family Medicine

## 2021-04-26 ENCOUNTER — Other Ambulatory Visit: Payer: Self-pay

## 2021-04-26 ENCOUNTER — Ambulatory Visit (INDEPENDENT_AMBULATORY_CARE_PROVIDER_SITE_OTHER): Payer: No Typology Code available for payment source | Admitting: Family Medicine

## 2021-04-26 ENCOUNTER — Ambulatory Visit: Payer: Self-pay

## 2021-04-26 ENCOUNTER — Encounter: Payer: Self-pay | Admitting: Neurology

## 2021-04-26 ENCOUNTER — Telehealth: Payer: Self-pay | Admitting: Physical Therapy

## 2021-04-26 ENCOUNTER — Ambulatory Visit: Payer: No Typology Code available for payment source | Admitting: Family Medicine

## 2021-04-26 VITALS — BP 130/86 | HR 74 | Ht 67.0 in | Wt 219.8 lb

## 2021-04-26 DIAGNOSIS — R202 Paresthesia of skin: Secondary | ICD-10-CM

## 2021-04-26 DIAGNOSIS — M5412 Radiculopathy, cervical region: Secondary | ICD-10-CM

## 2021-04-26 DIAGNOSIS — G8929 Other chronic pain: Secondary | ICD-10-CM

## 2021-04-26 DIAGNOSIS — M25561 Pain in right knee: Secondary | ICD-10-CM | POA: Diagnosis not present

## 2021-04-26 NOTE — Telephone Encounter (Signed)
As pt was leaving her visit this afternoon, she expressed wanting to know if there was a different location she could go to for the NCV/EMG vs [facility] neurology.  She states that she lives in [place] and works in [place].  Curious if another neurology group in [place] or [place] might be able to get her in quicker.  Pt states that communication through [facility] is probably the best and easiest form of communication.  Please message the pt and change referral if needed.

## 2021-04-26 NOTE — Patient Instructions (Addendum)
Good to see you today.  You had a R knee injection.  Call or go to the ER if you develop a large red swollen joint with extreme pain or oozing puss.   I've referred you to neurology for an UE NCV/EMG.  Follow-up: after NCV/EMG.  Call to schedule once this test has been done.

## 2021-04-26 NOTE — Progress Notes (Signed)
I, Melanie [name], LAT, ATC, am serving as scribe for Dr. [name].  Melanie [name] is a 56 y.o. female who presents to [place] at [facility] today for R knee pain. Pt was last seen by [name] on 04/02/21 for R shoulder pain and was give a R subacromial steroid injection. Today, pt c/o R knee pain x years that has worsened since last week. Pt locates pain to her R ant knee.  She states that her L knee has started to bother her too.   She notes bothersome right arm paresthesia into her hand.  She is not sure if this is related to cervical radiculopathy or more peripheral neuropathy.  She is had an epidural steroid injection on 17 October which has helped some.  She would be willing to consider carpal tunnel surgery if needed.  She is willing to proceed to nerve conduction study to further characterize her arm symptoms and cervical radicular symptoms.  She is also willing to repeat epidural steroid injection if needed.  R knee swelling: yes Mechanical symptoms: No Aggravates: walking; weight-bearing activity Treatments tried: Voltaren gel; Celebrex bid;   Dx imaging: 09/24/20 L-spine XR  04/18/20 R knee MRI  03/18/19 R knee XR  Pertinent review of systems: No fevers or chills  Relevant historical information: Hypertension.  Diabetes.   Exam:  BP 130/86 (BP Location: Right Arm, Patient Position: Sitting, Cuff Size: Large)   Pulse 74   Ht 5' 7"  (1.702 m)   Wt 219 lb 12.8 oz (99.7 kg)   SpO2 99%   BMI 34.43 kg/m  General: Well Developed, well nourished, and in no acute distress.   MSK: Right knee moderate effusion normal motion with crepitation tender palpation medial joint line stable ligamentous exam.  C-spine normal motion.    Lab and Radiology Results  Procedure: Real-time Ultrasound Guided Injection of right knee superior lateral patellar space Device: Philips Affiniti 50G Images permanently stored and available for review in PACS Verbal  informed consent obtained.  Discussed risks and benefits of procedure. Warned about infection bleeding damage to structures skin hypopigmentation and fat atrophy among others. Patient expresses understanding and agreement Time-out conducted.   Noted no overlying erythema, induration, or other signs of local infection.   Skin prepped in a sterile fashion.   Local anesthesia: Topical Ethyl chloride.   With sterile technique and under real time ultrasound guidance: 40 mg of Kenalog and 2 mL of lidocaine injected into knee joint. Fluid seen entering the joint capsule.   Completed without difficulty   Pain immediately resolved suggesting accurate placement of the medication.   Advised to call if fevers/chills, erythema, induration, drainage, or persistent bleeding.   Images permanently stored and available for review in the ultrasound unit.  Impression: Technically successful ultrasound guided injection.      EXAM: MRI OF THE RIGHT KNEE WITHOUT CONTRAST   TECHNIQUE: Multiplanar, multisequence MR imaging of the knee was performed. No intravenous contrast was administered.   COMPARISON:  Right knee x-rays dated March 18, 2019.   FINDINGS: MENISCI   Medial meniscus:  Intact.   Lateral meniscus:  Intact.   LIGAMENTS   Cruciates:  Intact ACL and PCL.   Collaterals: Medial collateral ligament is intact. Lateral collateral ligament complex is intact.   CARTILAGE   Patellofemoral:  No chondral defect.   Medial:  Mild diffuse cartilage thinning without focal defect.   Lateral:  No chondral defect.   Joint: Trace joint effusion. 8 mm  ganglion cyst in the anteromedial portion of Hoffa's fat.   Popliteal Fossa:  No Baker cyst. Intact popliteus tendon.   Extensor Mechanism: Intact quadriceps tendon and patellar tendon. Intact medial and lateral patellar retinaculum. Intact MPFL.   Bones: No focal marrow signal abnormality. No fracture or dislocation.   Other: None.    IMPRESSION: 1. No evidence of internal derangement. 2. Mild medial compartment degenerative changes.     Electronically Signed   By: [name] [name]   On: 04/19/2020 07:16   EXAM: MRI CERVICAL SPINE WITHOUT CONTRAST   TECHNIQUE: Multiplanar, multisequence MR imaging of the cervical spine was performed. No intravenous contrast was administered.   COMPARISON:  None.   FINDINGS: Alignment: Mild anterolisthesis C3-4 and C4-5. Mild retrolisthesis C5-6 and C6-7 with straightening of the cervical lordosis.   Vertebrae: Negative for fracture or mass.  Normal bone marrow   Cord: Normal signal and morphology of the spinal cord   Posterior Fossa, vertebral arteries, paraspinal tissues: Negative   Disc levels:   C2-3: Negative   C3-4: Small central disc protrusion without significant stenosis.   C4-5: Small central disc protrusion.  Negative for stenosis.   C5-6: Disc degeneration and diffuse uncinate spurring. Moderate foraminal narrowing on the right and mild foraminal narrowing on the left due to spurring. Mild spinal stenosis.   C6-7: Disc degeneration and spondylosis with diffuse uncinate spurring. Moderate right foraminal encroachment and mild left foraminal encroachment due to spurring   C7-T1: Negative   IMPRESSION: Multilevel degenerative change in the cervical spine as above. Moderate right foraminal encroachment C5-6 and C6-7 due to spurring. Mild left foraminal encroachment C5-6 and C6-7 due to spurring.     Electronically Signed   By: Franchot Gallo [name]   On: 12/18/2018 10:06    I, [name], personally (independently) visualized and performed the interpretation of the images attached in this note.     Assessment and Plan: 55 y.o. 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  Patient has an MRI of her knee from 1 year ago that does show some mild medial compartment DJD. Plan for steroid injection as well as  Voltaren gel.  Recheck if not improving.  Right arm paresthesias and cervical radiculopathy.  Patient had epidural steroid injection 1 week ago that did provide significant benefit.  Certainly could repeat the epidural steroid injection if needed.  Additionally she is thought to have more peripheral neuropathy such as carpal tunnel syndrome or cubital tunnel syndrome.  We will proceed to nerve conduction study in the near future to further characterize both the cervical radicular location as well as the more peripheral nerve compression issues.  Certainly could repeat epidural steroid injection if needed.  Patient notes that the epidural steroid injection that she had December 2021 at right C7-T1 was more effective than the most recent epidural steroid injection October 2022 at right C6-C7.  If future epidural steroid injections are ordered probably should be done at right C7-T1 if feasible.   PDMP not reviewed this encounter. Orders Placed This Encounter  Procedures   [place] LIMITED JOINT SPACE STRUCTURES LOW RIGHT(NO LINKED CHARGES)    Standing Status:   Future    Number of Occurrences:   1    Standing Expiration Date:   10/25/2021    Order Specific Question:   Reason for Exam (SYMPTOM  OR DIAGNOSIS REQUIRED)    Answer:   right knee pain    Order Specific Question:  Preferred imaging location?    Answer:   [place]   Ambulatory referral to Neurology    Referral Priority:   Routine    Referral Type:   Consultation    Referral Reason:   Specialty Services Required    Requested Specialty:   Neurology    Number of Visits Requested:   1   NCV with EMG(electromyography)    R UE paresthesias    Standing Status:   Future    Standing Expiration Date:   04/26/2022    Order Specific Question:   Where should this test be performed?    Answer:   [facility]   No orders of the defined types were placed in this encounter.    Discussed warning signs or symptoms. Please see  discharge instructions. Patient expresses understanding.   The above documentation has been reviewed and is accurate and complete [name], [name]  Total encounter time 30 minutes including face-to-face time with the patient and, reviewing past medical record, and charting on the date of service.   Discussed treatment plan and options.  Time-based billing excludes time to perform injection.

## 2021-04-28 ENCOUNTER — Other Ambulatory Visit (HOSPITAL_COMMUNITY): Payer: Self-pay

## 2021-04-28 ENCOUNTER — Other Ambulatory Visit: Payer: Self-pay | Admitting: Medical-Surgical

## 2021-04-28 ENCOUNTER — Other Ambulatory Visit: Payer: Self-pay | Admitting: Family Medicine

## 2021-04-28 DIAGNOSIS — I10 Essential (primary) hypertension: Secondary | ICD-10-CM

## 2021-04-28 DIAGNOSIS — F900 Attention-deficit hyperactivity disorder, predominantly inattentive type: Secondary | ICD-10-CM

## 2021-04-28 MED ORDER — LOSARTAN POTASSIUM 25 MG PO TABS
ORAL_TABLET | Freq: Every day | ORAL | 0 refills | Status: DC
  Filled 2021-04-28 (×2): qty 90, 90d supply, fill #0

## 2021-04-29 ENCOUNTER — Other Ambulatory Visit (HOSPITAL_COMMUNITY): Payer: Self-pay

## 2021-04-29 MED ORDER — CARESTART COVID-19 HOME TEST VI KIT
PACK | 0 refills | Status: DC
  Filled 2021-04-29: qty 4, 4d supply, fill #0

## 2021-05-06 ENCOUNTER — Telehealth: Payer: Self-pay | Admitting: Family Medicine

## 2021-05-06 ENCOUNTER — Other Ambulatory Visit (HOSPITAL_COMMUNITY): Payer: Self-pay

## 2021-05-06 ENCOUNTER — Other Ambulatory Visit: Payer: Self-pay | Admitting: Family Medicine

## 2021-05-06 DIAGNOSIS — F900 Attention-deficit hyperactivity disorder, predominantly inattentive type: Secondary | ICD-10-CM

## 2021-05-06 NOTE — Telephone Encounter (Signed)
Betsy from [facility] called [phone]). She is trying to get the next injection approved but insurance wants to know what % of pain relief pt had from last injection before they will approve.

## 2021-05-06 NOTE — Telephone Encounter (Signed)
Spoke with pt and [name], consensus is 75% pain relief. Info given to [place] at [employer] [facility].

## 2021-05-07 ENCOUNTER — Other Ambulatory Visit (HOSPITAL_COMMUNITY): Payer: Self-pay

## 2021-05-07 MED ORDER — LISDEXAMFETAMINE DIMESYLATE 60 MG PO CAPS
60.0000 mg | ORAL_CAPSULE | Freq: Every day | ORAL | 0 refills | Status: DC
Start: 2021-05-07 — End: 2021-06-07
  Filled 2021-05-07: qty 30, 30d supply, fill #0

## 2021-05-20 ENCOUNTER — Other Ambulatory Visit: Payer: Self-pay

## 2021-05-20 ENCOUNTER — Ambulatory Visit: Payer: No Typology Code available for payment source | Admitting: Neurology

## 2021-05-20 DIAGNOSIS — R202 Paresthesia of skin: Secondary | ICD-10-CM

## 2021-05-20 DIAGNOSIS — G5601 Carpal tunnel syndrome, right upper limb: Secondary | ICD-10-CM

## 2021-05-20 NOTE — Procedures (Signed)
[facility]  [place], [place]  [place], [place] [address] Tel: [phone] Fax:  [phone] Test Date:  05/20/2021  Patient: Melanie [name] DOB: 07-08-1965 Physician: Narda Amber, DO  Sex: Female Height: 5' 7"  Ref Phys: [name], [name]  ID#: [id number]   Technician:    Patient Complaints: This is a 54 year old female referred for evaluation of right arm paresthesias.  NCV & EMG Findings: Extensive electrodiagnostic testing of the right upper extremity shows:  Right median sensory response shows prolonged latency (3.8 ms).  Right ulnar sensory responses within normal limits.  Right median and ulnar motor responses are within normal limits.   There is no evidence of active or chronic motor axonal loss changes affecting any of the tested muscles.  Motor unit configuration and recruitment pattern is within normal limits.    Impression: Right median neuropathy at or distal to the wrist (mild), consistent with a clinical diagnosis of carpal tunnel syndrome.   There is no evidence of a cervical radiculopathy affecting the right upper extremity.   ___________________________ Narda Amber, DO    Nerve Conduction Studies Anti Sensory Summary Table   Stim Site NR Peak (ms) Norm Peak (ms) P-T Amp (V) Norm P-T Amp  Right Median Anti Sensory (2nd Digit)  32C  Wrist    3.8 <3.6 24.4 >15  Right Ulnar Anti Sensory (5th Digit)  32C  Wrist    2.5 <3.1 40.3 >10   Motor Summary Table   Stim Site NR Onset (ms) Norm Onset (ms) O-P Amp (mV) Norm O-P Amp Site1 Site2 Delta-0 (ms) Dist (cm) Vel (m/s) Norm Vel (m/s)  Right Median Motor (Abd Poll Brev)  32C  Wrist    3.8 <4.0 8.2 >6 Elbow Wrist 5.3 29.0 55 >50  Elbow    9.1  8.1         Right Ulnar Motor (Abd Dig Minimi)  32C  Wrist    2.1 <3.1 10.8 >7 B Elbow Wrist 4.0 23.0 58 >50  B Elbow    6.1  10.0  A Elbow B Elbow 1.8 10.0 56 >50  A Elbow    7.9  9.8          EMG   Side Muscle Ins Act Fibs Psw Fasc Number  Recrt Dur Dur. Amp Amp. Poly Poly. Comment  Right 1stDorInt Nml Nml Nml Nml Nml Nml Nml Nml Nml Nml Nml Nml N/A  Right Abd Poll Brev Nml Nml Nml Nml Nml Nml Nml Nml Nml Nml Nml Nml N/A  Right PronatorTeres Nml Nml Nml Nml Nml Nml Nml Nml Nml Nml Nml Nml N/A  Right Biceps Nml Nml Nml Nml Nml Nml Nml Nml Nml Nml Nml Nml N/A  Right Triceps Nml Nml Nml Nml Nml Nml Nml Nml Nml Nml Nml Nml N/A  Right Deltoid Nml Nml Nml Nml Nml Nml Nml Nml Nml Nml Nml Nml N/A  Right Cervical Parasp Low Nml Nml Nml Nml NE - - - - - - - N/A      Waveforms:

## 2021-05-21 ENCOUNTER — Encounter: Payer: Self-pay | Admitting: Family Medicine

## 2021-05-21 NOTE — Progress Notes (Signed)
Nerve conduction study shows right carpal tunnel syndrome. Radiology did not see much evidence of cervical radiculopathy however nerve conduction studies can often miss mild nerve compression. Reasonable to consider return to clinic to go over the results in full detail.

## 2021-05-21 NOTE — Telephone Encounter (Signed)
Spoke with pt, informed provider not in office this afternoon. She has no availability for appts next week due to work schedule and our holiday closure. Asked pt to sent a separate MyChart with specific questions for provider.

## 2021-05-28 ENCOUNTER — Other Ambulatory Visit: Payer: Self-pay | Admitting: Medical-Surgical

## 2021-05-28 DIAGNOSIS — F900 Attention-deficit hyperactivity disorder, predominantly inattentive type: Secondary | ICD-10-CM

## 2021-05-31 ENCOUNTER — Ambulatory Visit: Payer: No Typology Code available for payment source | Admitting: Medical-Surgical

## 2021-05-31 ENCOUNTER — Other Ambulatory Visit (HOSPITAL_COMMUNITY): Payer: Self-pay

## 2021-05-31 NOTE — Telephone Encounter (Signed)
Canceled appt today, has not rescheduled.

## 2021-06-03 ENCOUNTER — Other Ambulatory Visit (HOSPITAL_COMMUNITY): Payer: Self-pay

## 2021-06-03 ENCOUNTER — Other Ambulatory Visit: Payer: Self-pay | Admitting: Medical-Surgical

## 2021-06-03 DIAGNOSIS — F900 Attention-deficit hyperactivity disorder, predominantly inattentive type: Secondary | ICD-10-CM

## 2021-06-03 LAB — HM DIABETES EYE EXAM

## 2021-06-04 ENCOUNTER — Other Ambulatory Visit: Payer: Self-pay

## 2021-06-04 ENCOUNTER — Other Ambulatory Visit: Payer: Self-pay | Admitting: Family Medicine

## 2021-06-04 ENCOUNTER — Ambulatory Visit
Admission: RE | Admit: 2021-06-04 | Discharge: 2021-06-04 | Disposition: A | Discharge status: Home / Self Care | Payer: No Typology Code available for payment source | Source: Ambulatory Visit | Attending: Family Medicine | Admitting: Family Medicine

## 2021-06-04 ENCOUNTER — Other Ambulatory Visit (HOSPITAL_COMMUNITY): Payer: Self-pay

## 2021-06-04 DIAGNOSIS — M5412 Radiculopathy, cervical region: Secondary | ICD-10-CM

## 2021-06-04 DIAGNOSIS — R202 Paresthesia of skin: Secondary | ICD-10-CM

## 2021-06-04 MED ORDER — TRIAMCINOLONE ACETONIDE 40 MG/ML IJ SUSP (RADIOLOGY)
60.0000 mg | Freq: Once | INTRAMUSCULAR | Status: AC
  Administered 2021-06-04: 60 mg via EPIDURAL

## 2021-06-04 MED ORDER — IOPAMIDOL (ISOVUE-M 300) INJECTION 61%
1.0000 mL | Freq: Once | INTRAMUSCULAR | Status: AC | PRN
  Administered 2021-06-04: 1 mL via EPIDURAL

## 2021-06-04 NOTE — Discharge Instructions (Signed)

## 2021-06-07 ENCOUNTER — Other Ambulatory Visit (HOSPITAL_COMMUNITY): Payer: Self-pay

## 2021-06-07 ENCOUNTER — Telehealth (INDEPENDENT_AMBULATORY_CARE_PROVIDER_SITE_OTHER): Payer: No Typology Code available for payment source | Admitting: Medical-Surgical

## 2021-06-07 ENCOUNTER — Encounter: Payer: Self-pay | Admitting: Medical-Surgical

## 2021-06-07 DIAGNOSIS — G4733 Obstructive sleep apnea (adult) (pediatric): Secondary | ICD-10-CM

## 2021-06-07 DIAGNOSIS — E119 Type 2 diabetes mellitus without complications: Secondary | ICD-10-CM | POA: Diagnosis not present

## 2021-06-07 DIAGNOSIS — F900 Attention-deficit hyperactivity disorder, predominantly inattentive type: Secondary | ICD-10-CM

## 2021-06-07 DIAGNOSIS — Z9989 Dependence on other enabling machines and devices: Secondary | ICD-10-CM | POA: Diagnosis not present

## 2021-06-07 MED ORDER — ONDANSETRON 8 MG PO TBDP
8.0000 mg | ORAL_TABLET | Freq: Three times a day (TID) | ORAL | 3 refills | Status: DC | PRN
  Filled 2021-06-07: qty 20, 7d supply, fill #0
  Filled 2021-08-06: qty 20, 7d supply, fill #1
  Filled 2021-10-07: qty 20, 7d supply, fill #2
  Filled 2021-11-03: qty 20, 7d supply, fill #3

## 2021-06-07 MED ORDER — TIRZEPATIDE 2.5 MG/0.5ML ~~LOC~~ SOAJ
2.5000 mg | SUBCUTANEOUS | 0 refills | Status: DC
  Filled 2021-06-07: qty 2, 28d supply, fill #0

## 2021-06-07 MED ORDER — LISDEXAMFETAMINE DIMESYLATE 60 MG PO CAPS
60.0000 mg | ORAL_CAPSULE | ORAL | 0 refills | Status: DC
Start: 2021-08-06 — End: 2021-08-10
  Filled 2021-08-06: qty 30, 30d supply, fill #0

## 2021-06-07 MED ORDER — LISDEXAMFETAMINE DIMESYLATE 60 MG PO CAPS
60.0000 mg | ORAL_CAPSULE | Freq: Every day | ORAL | 0 refills | Status: DC
Start: 2021-06-07 — End: 2021-08-10
  Filled 2021-06-07: qty 30, 30d supply, fill #0

## 2021-06-07 MED ORDER — LISDEXAMFETAMINE DIMESYLATE 60 MG PO CAPS
60.0000 mg | ORAL_CAPSULE | ORAL | 0 refills | Status: DC
Start: 2021-07-07 — End: 2021-08-10
  Filled 2021-07-08: qty 30, 30d supply, fill #0

## 2021-06-07 NOTE — Progress Notes (Signed)
Pt doing well on vyvanse. Direct links sent.

## 2021-06-07 NOTE — Progress Notes (Signed)
Virtual Visit via Video Note  I connected with Melanie [name] on 06/07/21 at  9:50 AM EST by a video enabled telemedicine application and verified that I am speaking with the correct person using two identifiers.   I discussed the limitations of evaluation and management by telemedicine and the availability of in person appointments. The patient expressed understanding and agreed to proceed.  Patient location: home Provider locations: office  Subjective:    CC: ADHD follow-up  HPI: Pleasant 55 year old female presenting via [facility] video visit for ADHD follow-up.  Has been taking Vyvanse 60 mg daily, tolerating well without side effects.  Feels the medication still works well for her.  She does take this medication every day first thing in the morning.  No changes in appetite, eating patterns, or sleeping habits.  She has gained a little bit of weight recently and feels like she may want to go back on Wegovy or something similar.  Was diagnosed with sleep apnea many years ago and recently had an updated sleep study.  We did do a prescription for CPAP but she has not followed up with this and no one is contacted her for this either.  She would like [place] to follow-up on that to see if we can get that taken care of.  Has not really been monitoring her diabetes and being as careful as she was previously.  She stopped taking the [facility].  She is interested in getting restarted on something that will help with diabetes as well as weight loss.  She is concerned regarding nausea that she was experiencing when she took [place].  Wonders if there is something we can use as needed to help with that.  Past medical history, Surgical history, Family history not pertinant except as noted below, Social history, Allergies, and medications have been entered into the medical record, reviewed, and corrections made.   Review of Systems: See HPI for pertinent positives and negatives.   Objective:    General:  Speaking clearly in complete sentences without any shortness of breath.  Alert and oriented x3.  Normal judgment. No apparent acute distress.  Impression and Recommendations:    1. Attention deficit hyperactivity disorder (ADHD), predominantly inattentive type Continue Vyvanse 60 mg daily.  Refill sent to pharmacy. - lisdexamfetamine (VYVANSE) 60 MG capsule; Take 1 capsule (60 mg total) by mouth daily.  Dispense: 30 capsule; Refill: 0 - lisdexamfetamine (VYVANSE) 60 MG capsule; Take 1 capsule (60 mg total) by mouth every morning.  Dispense: 30 capsule; Refill: 0 - lisdexamfetamine (VYVANSE) 60 MG capsule; Take 1 capsule (60 mg total) by mouth every morning.  Dispense: 30 capsule; Refill: 0  2. OSA on CPAP Reordering CPAP.  We will have this faxed to one of the companies that work with a focus plan per patient request.  3. Type 2 diabetes mellitus without complication, without long-term current use of insulin ([place]) Starting Mounjaro 2.5 mg daily.  Sending in Zofran to use as needed for nausea.  Discussed $25 coupon available online.  I discussed the assessment and treatment plan with the patient. The patient was provided an opportunity to ask questions and all were answered. The patient agreed with the plan and demonstrated an understanding of the instructions.   The patient was advised to call back or seek an in-person evaluation if the symptoms worsen or if the condition fails to improve as anticipated.  25 minutes of non-face-to-face time was provided during this encounter.  Return in about 4 weeks (around  07/05/2021) for weight/DM follow up.  Clearnce Sorrel, DNP, APRN, FNP-BC [facility]

## 2021-06-23 ENCOUNTER — Ambulatory Visit (INDEPENDENT_AMBULATORY_CARE_PROVIDER_SITE_OTHER): Payer: No Typology Code available for payment source | Admitting: Family Medicine

## 2021-06-23 ENCOUNTER — Ambulatory Visit: Payer: Self-pay

## 2021-06-23 ENCOUNTER — Other Ambulatory Visit: Payer: Self-pay

## 2021-06-23 VITALS — BP 160/90 | HR 80 | Ht 67.0 in | Wt 225.0 lb

## 2021-06-23 DIAGNOSIS — G5601 Carpal tunnel syndrome, right upper limb: Secondary | ICD-10-CM

## 2021-06-23 DIAGNOSIS — M797 Fibromyalgia: Secondary | ICD-10-CM | POA: Diagnosis not present

## 2021-06-23 DIAGNOSIS — M5136 Other intervertebral disc degeneration, lumbar region: Secondary | ICD-10-CM | POA: Diagnosis not present

## 2021-06-23 DIAGNOSIS — M791 Myalgia, unspecified site: Secondary | ICD-10-CM

## 2021-06-23 DIAGNOSIS — M25531 Pain in right wrist: Secondary | ICD-10-CM

## 2021-06-23 DIAGNOSIS — M51369 Other intervertebral disc degeneration, lumbar region without mention of lumbar back pain or lower extremity pain: Secondary | ICD-10-CM

## 2021-06-23 DIAGNOSIS — M2550 Pain in unspecified joint: Secondary | ICD-10-CM

## 2021-06-23 NOTE — Patient Instructions (Addendum)
Good to see you   You received a steroid injection in your wrist today. Seek immediate medical attention if the joint becomes red, extremely painful, or is oozing fluid.   Try the lyrica for fibromyalgia 50 -75 mg twice daily.   We will get the gel shots authorized for the knee.   Use a night splint for carpal tunnel.   You will hear from my office about scheduling the gel injection once approved.

## 2021-06-23 NOTE — Progress Notes (Signed)
I, Melanie [name], LAT, ATC acting as a scribe for Melanie Leader, MD.  Melanie [name] is a 55 y.o. female who presents to [place] at [facility] today for continued R knee pain and cervical radiculopathy w/ R wrist/arm pain w/ paresthesia into her R hand. Pt was last seen by [name] on 04/26/21 and was given a R knee steroid injection and was referred for a NCV study. Pt's last cervical ESI was performed on 06/04/21 at C7-T1. Today, pt reports that her right knee is pretty bad pain is still lateral but is now having right medial knee pain. her right wrist and hand are very painful, patient has also had low back pain.   Issues today.  Right hand paresthesia.  Low back pain.  Right knee pain.  Generalized body aches and pains.  Dx imaging: 11/09/20 R hand XR 09/24/20 L-spine XR             04/18/20 R knee MRI             03/18/19 R knee XR  Pertinent review of systems: No fevers or chills.  Positive for generalized body aches.  Relevant historical information: Diabetes   Exam:  BP (!) 160/90    Pulse 80    Ht 5' 7"  (1.702 m)    Wt 225 lb (102.1 kg)    SpO2 98%    BMI 35.24 kg/m  General: Well Developed, well nourished, and in no acute distress.   MSK:  Right hand normal.  Normal hand motion.  Positive Tinel's carpal tunnel.  L-spine normal-appearing nontender midline.  Tender palpation right lumbar paraspinal musculature.  Normal lumbar motion.  Tender palpation multiple muscle groups across of her lower extremities.    Lab and Radiology Results  Procedure: Real-time Ultrasound Guided hydrodissection of median nerve right carpal tunnel Device: Philips Affiniti 50G Images permanently stored and available for review in PACS Verbal informed consent obtained.  Discussed risks and benefits of procedure. Warned about infection bleeding damage to structures skin hypopigmentation and fat atrophy among others. Patient expresses understanding and agreement Time-out  conducted.   Noted no overlying erythema, induration, or other signs of local infection.   Skin prepped in a sterile fashion.   Local anesthesia: Topical Ethyl chloride.   With sterile technique and under real time ultrasound guidance: 40 mg of Kenalog and 2 mL of Marcaine injected into the right carpal tunnel around median nerve. Fluid seen entering the carpal tunnel.   Completed without difficulty   Pain immediately resolved suggesting accurate placement of the medication.   Advised to call if fevers/chills, erythema, induration, drainage, or persistent bleeding.   Images permanently stored and available for review in the ultrasound unit.  Impression: Technically successful ultrasound guided injection.    Nerve conduction study dated May 20, 2021 Patient Complaints: This is a 55 year old female referred for evaluation of right arm paresthesias.   NCV & EMG Findings: Extensive electrodiagnostic testing of the right upper extremity shows:  Right median sensory response shows prolonged latency (3.8 ms).  Right ulnar sensory responses within normal limits.  Right median and ulnar motor responses are within normal limits.   There is no evidence of active or chronic motor axonal loss changes affecting any of the tested muscles.  Motor unit configuration and recruitment pattern is within normal limits.     Impression: Right median neuropathy at or distal to the wrist (mild), consistent with a clinical diagnosis of carpal tunnel syndrome.  There is no evidence of a cervical radiculopathy affecting the right upper extremity.     ___________________________ Melanie Amber, DO     Assessment and Plan: 55 y.o. female with multiple issues.   Right carpal tunnel syndrome confirmed on EMG.  Patient also very likely has cervical radiculopathy which was not seen on EMG. After discussion plan for carpal tunnel injection today.  Proceed with night splinting.  Knee pain.  Previously tried  steroid injection.  We will get hyaluronic acid and authorization now and proceed with hyaluronic acid injection.  Low back pain.  Conservative measures strategies consider physical therapy.  Consider referral to my colleague for osteopathic manipulation.  Generalized body aches.  Patient had rheumatologic work-up previously.  Pain thought currently to be due to fibromyalgia.  Discussed options.  Patient would like to retry Lyrica.  She already has Lyrica at home.  We will start at 50 mg twice daily and increase to 75 twice daily.  Backup plan is Cymbalta.  PDMP not reviewed this encounter. Orders Placed This Encounter  Procedures   [place] LIMITED JOINT SPACE STRUCTURES UP RIGHT(NO LINKED CHARGES)    Standing Status:   Future    Number of Occurrences:   1    Standing Expiration Date:   12/22/2021    Order Specific Question:   Reason for Exam (SYMPTOM  OR DIAGNOSIS REQUIRED)    Answer:   right wrist pain    Order Specific Question:   Preferred imaging location?    Answer:   [place]   No orders of the defined types were placed in this encounter.    Discussed warning signs or symptoms. Please see discharge instructions. Patient expresses understanding.   The above documentation has been reviewed and is accurate and complete Melanie [name], [name]   Total encounter time 30 minutes including face-to-face time with the patient and, reviewing past medical record, and charting on the date of service.   Discussed treatment plan and options.  Time excludes time perform injection.

## 2021-06-25 ENCOUNTER — Encounter: Payer: Self-pay | Admitting: Family Medicine

## 2021-07-06 ENCOUNTER — Encounter: Payer: Self-pay | Admitting: Medical-Surgical

## 2021-07-07 ENCOUNTER — Other Ambulatory Visit (HOSPITAL_COMMUNITY): Payer: Self-pay

## 2021-07-07 ENCOUNTER — Other Ambulatory Visit: Payer: Self-pay | Admitting: Medical-Surgical

## 2021-07-07 MED ORDER — TIRZEPATIDE 2.5 MG/0.5ML ~~LOC~~ SOAJ
2.5000 mg | SUBCUTANEOUS | 0 refills | Status: DC
  Filled 2021-07-07: qty 2, 28d supply, fill #0

## 2021-07-08 ENCOUNTER — Other Ambulatory Visit (HOSPITAL_COMMUNITY): Payer: Self-pay

## 2021-07-08 MED ORDER — CARESTART COVID-19 HOME TEST VI KIT
PACK | 0 refills | Status: DC
  Filled 2021-07-08: qty 4, 4d supply, fill #0

## 2021-07-09 NOTE — Telephone Encounter (Signed)
Spoke to pt & informed her that unfortunately she will need to contact [place] at [phone] to have the ordering radiologist change the dx code. Pt understood and stated that she would call their office.

## 2021-07-09 NOTE — Telephone Encounter (Signed)
Can you look at this

## 2021-07-14 ENCOUNTER — Ambulatory Visit: Payer: No Typology Code available for payment source | Admitting: Medical-Surgical

## 2021-07-15 ENCOUNTER — Other Ambulatory Visit (HOSPITAL_COMMUNITY): Payer: Self-pay

## 2021-08-05 ENCOUNTER — Ambulatory Visit: Payer: No Typology Code available for payment source | Admitting: Family Medicine

## 2021-08-05 ENCOUNTER — Ambulatory Visit: Payer: Self-pay

## 2021-08-05 ENCOUNTER — Other Ambulatory Visit: Payer: Self-pay

## 2021-08-05 DIAGNOSIS — M1711 Unilateral primary osteoarthritis, right knee: Secondary | ICD-10-CM

## 2021-08-05 DIAGNOSIS — G8929 Other chronic pain: Secondary | ICD-10-CM

## 2021-08-05 DIAGNOSIS — M25561 Pain in right knee: Secondary | ICD-10-CM

## 2021-08-05 DIAGNOSIS — M797 Fibromyalgia: Secondary | ICD-10-CM | POA: Diagnosis not present

## 2021-08-05 NOTE — Progress Notes (Signed)
Melanie [name] is a 56 y.o. female who presents to [place] at [facility] today for right knee pain. She has had difficult to manage right knee pain thought to be due to DJD.  She last had an MRI of her right knee on April 18, 2020 which showed only medial compartment DJD.  However she has had pain out of proportion to this and had been unable to improve despite typical conservative management strategies including typical cortisone injections and physical therapy type exercises.  Additionally she notes overall body pain thought to be due to fibromyalgia.  She has been prescribed Lyrica which she takes only intermittently.  She has been reluctant to consider long-term medications.  She does take Celebrex regularly.  Pertinent review of systems: Fevers or chills  Relevant historical information: Fibromyalgia   Exam:  Heart rate 80 General: Well Developed, well nourished, and in no acute distress.   MSK: Right knee moderate effusion.    Lab and Radiology Results  Procedure: Real-time Ultrasound Guided Injection of knee superior lateral patellar space Device: Philips Affiniti 50G Images permanently stored and available for review in PACS Verbal informed consent obtained.  Discussed risks and benefits of procedure. Warned about infection bleeding damage to structures skin hypopigmentation and fat atrophy among others. Patient expresses understanding and agreement Time-out conducted.   Noted no overlying erythema, induration, or other signs of local infection.   Skin prepped in a sterile fashion.   Local anesthesia: Topical Ethyl chloride.   With sterile technique and under real time ultrasound guidance: Zilretta 32 mg injected into knee joint. Fluid seen entering the joint capsule.   Completed without difficulty    Advised to call if fevers/chills, erythema, induration, drainage, or persistent bleeding.   Images permanently stored and available for  review in the ultrasound unit.  Impression: Technically successful ultrasound guided injection. Lot number [id number]    EXAM: MRI OF THE RIGHT KNEE WITHOUT CONTRAST   TECHNIQUE: Multiplanar, multisequence MR imaging of the knee was performed. No intravenous contrast was administered.   COMPARISON:  Right knee x-rays dated March 18, 2019.   FINDINGS: MENISCI   Medial meniscus:  Intact.   Lateral meniscus:  Intact.   LIGAMENTS   Cruciates:  Intact ACL and PCL.   Collaterals: Medial collateral ligament is intact. Lateral collateral ligament complex is intact.   CARTILAGE   Patellofemoral:  No chondral defect.   Medial:  Mild diffuse cartilage thinning without focal defect.   Lateral:  No chondral defect.   Joint: Trace joint effusion. 8 mm ganglion cyst in the anteromedial portion of Hoffa's fat.   Popliteal Fossa:  No Baker cyst. Intact popliteus tendon.   Extensor Mechanism: Intact quadriceps tendon and patellar tendon. Intact medial and lateral patellar retinaculum. Intact MPFL.   Bones: No focal marrow signal abnormality. No fracture or dislocation.   Other: None.   IMPRESSION: 1. No evidence of internal derangement. 2. Mild medial compartment degenerative changes.     Electronically Signed   By: [name] [name]   On: 04/19/2020 07:16   I, [name], personally (independently) visualized and performed the interpretation of the images attached in this note.     Assessment and Plan: 56 y.o. female with  Right knee pain thought to be due to DJD.  She has been unable to improved sufficiently with typical conservative management strategies.  Her pain and effusion are likely out of proportion to the degree of degenerative changes seen on MRI  from October 2021.  Plan for Zilretta injection today.  However if this does not work sufficiently well would likely repeat x-ray and subsequently potentially repeat MRI. She will let me know and I can order  an x-ray to the [place] location as that is closer to where she lives.  Additional we discussed her overall pain thought to be due to fibromyalgia.  She is very frustrated with her overall pain experience.  Recommend taking the Lyrica more regularly.  If this is not sufficient Cymbalta may be a good choice.  I provided her with the name of Cymbalta that she can research some on her own.   PDMP not reviewed this encounter. Orders Placed This Encounter  Procedures   [place] LIMITED JOINT SPACE STRUCTURES LOW RIGHT(NO LINKED CHARGES)    Order Specific Question:   Reason for Exam (SYMPTOM  OR DIAGNOSIS REQUIRED)    Answer:   R knee pain    Order Specific Question:   Preferred imaging location?    Answer:   [place]   No orders of the defined types were placed in this encounter.    Discussed warning signs or symptoms. Please see discharge instructions. Patient expresses understanding.   The above documentation has been reviewed and is accurate and complete [name], [name]

## 2021-08-05 NOTE — Patient Instructions (Signed)
Good to see you.  You had a R knee Zilretta injection.  Call or go to the ER if you develop a large red swollen joint with extreme pain or oozing puss.   Follow-up as needed.

## 2021-08-06 ENCOUNTER — Other Ambulatory Visit (HOSPITAL_COMMUNITY): Payer: Self-pay

## 2021-08-06 ENCOUNTER — Other Ambulatory Visit: Payer: Self-pay | Admitting: Medical-Surgical

## 2021-08-06 DIAGNOSIS — Z9109 Other allergy status, other than to drugs and biological substances: Secondary | ICD-10-CM

## 2021-08-06 DIAGNOSIS — I10 Essential (primary) hypertension: Secondary | ICD-10-CM

## 2021-08-06 MED ORDER — CELECOXIB 200 MG PO CAPS
ORAL_CAPSULE | ORAL | 0 refills | Status: DC
  Filled 2021-08-06: qty 180, 90d supply, fill #0

## 2021-08-06 MED ORDER — IPRATROPIUM BROMIDE 0.03 % NA SOLN
NASAL | 2 refills | Status: DC
  Filled 2021-08-06: qty 30, 30d supply, fill #0
  Filled 2021-10-07: qty 30, 43d supply, fill #1

## 2021-08-06 MED ORDER — LOSARTAN POTASSIUM 25 MG PO TABS
ORAL_TABLET | Freq: Every day | ORAL | 0 refills | Status: DC
  Filled 2021-08-06: qty 90, 90d supply, fill #0

## 2021-08-06 MED ORDER — LEVOCETIRIZINE DIHYDROCHLORIDE 5 MG PO TABS
ORAL_TABLET | Freq: Every evening | ORAL | 1 refills | Status: DC
  Filled 2021-08-06: qty 90, 90d supply, fill #0
  Filled 2022-01-31: qty 90, 90d supply, fill #1

## 2021-08-10 ENCOUNTER — Other Ambulatory Visit (HOSPITAL_COMMUNITY): Payer: Self-pay

## 2021-08-10 ENCOUNTER — Encounter: Payer: Self-pay | Admitting: Medical-Surgical

## 2021-08-10 ENCOUNTER — Other Ambulatory Visit: Payer: Self-pay

## 2021-08-10 ENCOUNTER — Ambulatory Visit (INDEPENDENT_AMBULATORY_CARE_PROVIDER_SITE_OTHER): Payer: No Typology Code available for payment source | Admitting: Medical-Surgical

## 2021-08-10 VITALS — BP 149/91 | HR 87 | Resp 20 | Ht 67.0 in | Wt 217.0 lb

## 2021-08-10 DIAGNOSIS — E119 Type 2 diabetes mellitus without complications: Secondary | ICD-10-CM

## 2021-08-10 DIAGNOSIS — I10 Essential (primary) hypertension: Secondary | ICD-10-CM

## 2021-08-10 DIAGNOSIS — E039 Hypothyroidism, unspecified: Secondary | ICD-10-CM

## 2021-08-10 DIAGNOSIS — F331 Major depressive disorder, recurrent, moderate: Secondary | ICD-10-CM

## 2021-08-10 DIAGNOSIS — F411 Generalized anxiety disorder: Secondary | ICD-10-CM

## 2021-08-10 DIAGNOSIS — F900 Attention-deficit hyperactivity disorder, predominantly inattentive type: Secondary | ICD-10-CM

## 2021-08-10 LAB — POCT GLYCOSYLATED HEMOGLOBIN (HGB A1C): HbA1c, POC (controlled diabetic range): 5.7 % (ref 0.0–7.0)

## 2021-08-10 MED ORDER — BUPROPION HCL ER (XL) 150 MG PO TB24
ORAL_TABLET | Freq: Every day | ORAL | 0 refills | Status: DC
  Filled 2021-08-10: qty 180, 90d supply, fill #0

## 2021-08-10 MED ORDER — LISDEXAMFETAMINE DIMESYLATE 60 MG PO CAPS: 60.0000 mg | ORAL_CAPSULE | ORAL | 0 refills | Status: DC

## 2021-08-10 MED ORDER — LISDEXAMFETAMINE DIMESYLATE 60 MG PO CAPS
60.0000 mg | ORAL_CAPSULE | ORAL | 0 refills | Status: DC
  Filled 2021-10-07: qty 30, 30d supply, fill #0

## 2021-08-10 MED ORDER — LISDEXAMFETAMINE DIMESYLATE 60 MG PO CAPS
60.0000 mg | ORAL_CAPSULE | Freq: Every day | ORAL | 0 refills | Status: DC
  Filled 2021-08-10 – 2021-08-11 (×2): qty 30, 30d supply, fill #0

## 2021-08-10 MED ORDER — LOSARTAN POTASSIUM 25 MG PO TABS: ORAL_TABLET | Freq: Every day | ORAL | 0 refills | Status: DC

## 2021-08-10 NOTE — Progress Notes (Signed)
HPI with pertinent ROS:   CC: Med refills  HPI: Pleasant 56 year old female presenting today for the following:  ADHD/mood: taking Vyvanse 47m daily, tolerating well without side effects. Also taking Wellbutrin 1561mdaily with no concerns. Feels that her attention and mood is still a mess even with her current medications. Is very frustrated with her health overall which is causing worsening anxiety/depression that in turn worsens attention. Feels that her chronic pain is also affecting her mood negatively but thinks that there has to be something out there besides another medication. Not currently doing counseling although she has had referrals before. She did reach out to the [facility] but was told that they don't have anyone currently available for counseling. Also wonders if her hormone levels are affecting her mood. Feels that her mood and attention issues were so much better when she was being treated with hormone replacement after her total hysterectomy. Does not currently see an OB/GYN but the one she saw a few years ago mentioned possible pellet placement for HRT. Denies SI/HI.  HTN- taking Losartan 2586maily, tolerating well without side effects. Feels that her BP seems to run higher than it was before switching to the Losartan. Not checking BP at home or work but notes that she can if needs be. Denies CP, SOB, palpitations, lower extremity edema, dizziness, headaches, or vision changes.   Chronic pain- taking Celebrex 200m80mice daily as needed. Using Tizanidine 4mg 48mneeded. Continues to see Sports medicine/Ortho for joint injections. Has been diagnosed with carpal tunnel and received an injection that has helped her right hand function better, although not back to baseline. Reports she was recommended to try Cymbalta but is hesitant to do so.   Wonders if she may need to apply for FMLA in relation to her chronic orthopedic/pain issues as well as her intermittent bowel function concerns.  Knows that she needs to initiate this process.   I reviewed the past medical history, family history, social history, surgical history, and allergies today and no changes were needed.  Please see the problem list section below in epic for further details.   Physical exam:   General: Well Developed, well nourished, and in no acute distress.  Neuro: Alert and oriented x3.  HEENT: Normocephalic, atraumatic.  Skin: Warm and dry. Cardiac: Regular rate and rhythm.  Respiratory: Not using accessory muscles, speaking in full sentences.  Impression and Recommendations:    1. Moderate episode of recurrent major depressive disorder (HCC) Continue Wellbutrin. Would likely benefit from a trial of Cymbalta for both chronic pain and mood management. Discussed this at length and she will let me know if this is something she would like to do. Referring to behavioral health for counseling. Feel that this would be very helpful if she can get scheduled with someone.  - [facility]ulatory [facility] (WELLBU[facility]IN XL) 150 MG 24 hr tablet; TAKE 2 TABLETS BY MOUTH DAILY  Dispense: 180 tablet; Refill: 0  2. Attention deficit hyperactivity disorder (ADHD), predominantly inattentive type Continue Vyvanse 60mg 33my. Inattention that is refractory to medication likely related to poorly controlled anxiety/depression and chronic pain.  - lisdexamfetamine (VYVANSE) 60 MG capsule; Take 1 capsule  by mouth daily.  Dispense: 30 capsule; Refill: 0 - lisdexamfetamine (VYVANSE) 60 MG capsule; Take 1 capsule by mouth every morning. (09/09/21)  Dispense: 30 capsule; Refill: 0 - lisdexamfetamine (VYVANSE) 60 MG capsule; Take 1 capsule (60 mg total) by mouth every morning. (10/09/21)  Dispense: 30 capsule; Refill:  0  3. Essential hypertension Checking labs. BP higher than recommended today. Continue Losartan 37m daily for now. Monitor BP at home/work at least 3 times weekly. I will send a MyChart message in  a couple of weeks to see if her readings are still elevated and if so, plan to increase Losartan to 528mdaily.  - losartan (COZAAR) 25 MG tablet; TAKE 1 TABLET  BY MOUTH DAILY.  Dispense: 90 tablet; Refill: 0 - CBC with Differential - COMPLETE METABOLIC PANEL WITH GFR - Lipid panel  4. GAD (generalized anxiety disorder) Referring to behavioral health. - [facility]. Hypothyroidism, unspecified type Checking TSH as this could be affecting overall mood.  - TSH  6. Type 2 diabetes mellitus without complication, without long-term current use of insulin (HCC) POCT A1c 5.7%, diet controlled.  - POCT glycosylated hemoglobin (Hb A1C)  Return in about 3 months (around 11/07/2021) for chronic disease follow up. ___________________________________________ JoClearnce SorrelDNP, APRN, FNP-BC [facility]

## 2021-08-11 ENCOUNTER — Other Ambulatory Visit (HOSPITAL_COMMUNITY): Payer: Self-pay

## 2021-08-11 LAB — COMPLETE METABOLIC PANEL WITHOUT GFR
AG Ratio: 1.6 (calc) (ref 1.0–2.5)
ALT: 28 U/L (ref 6–29)
AST: 18 U/L (ref 10–35)
Albumin: 4.7 g/dL (ref 3.6–5.1)
Alkaline phosphatase (APISO): 83 U/L (ref 37–153)
BUN: 12 mg/dL (ref 7–25)
CO2: 29 mmol/L (ref 20–32)
Calcium: 10 mg/dL (ref 8.6–10.4)
Chloride: 100 mmol/L (ref 98–110)
Creat: 0.76 mg/dL (ref 0.50–1.03)
Globulin: 3 g/dL (ref 1.9–3.7)
Glucose, Bld: 106 mg/dL — ABNORMAL HIGH (ref 65–99)
Potassium: 4.2 mmol/L (ref 3.5–5.3)
Sodium: 138 mmol/L (ref 135–146)
Total Bilirubin: 0.9 mg/dL (ref 0.2–1.2)
Total Protein: 7.7 g/dL (ref 6.1–8.1)
eGFR: 93 mL/min/1.73m2

## 2021-08-11 LAB — CBC WITH DIFFERENTIAL/PLATELET
Absolute Monocytes: 735 cells/uL (ref 200–950)
Basophils Absolute: 63 cells/uL (ref 0–200)
Basophils Relative: 0.8 %
Eosinophils Absolute: 111 cells/uL (ref 15–500)
Eosinophils Relative: 1.4 %
HCT: 46 % — ABNORMAL HIGH (ref 35.0–45.0)
Hemoglobin: 15.9 g/dL — ABNORMAL HIGH (ref 11.7–15.5)
Lymphs Abs: 1983 cells/uL (ref 850–3900)
MCH: 30.7 pg (ref 27.0–33.0)
MCHC: 34.6 g/dL (ref 32.0–36.0)
MCV: 88.8 fL (ref 80.0–100.0)
MPV: 11.2 fL (ref 7.5–12.5)
Monocytes Relative: 9.3 %
Neutro Abs: 5009 cells/uL (ref 1500–7800)
Neutrophils Relative %: 63.4 %
Platelets: 404 10*3/uL — ABNORMAL HIGH (ref 140–400)
RBC: 5.18 10*6/uL — ABNORMAL HIGH (ref 3.80–5.10)
RDW: 11.8 % (ref 11.0–15.0)
Total Lymphocyte: 25.1 %
WBC: 7.9 10*3/uL (ref 3.8–10.8)

## 2021-08-11 LAB — LIPID PANEL
Cholesterol: 265 mg/dL — ABNORMAL HIGH (ref <200)
HDL: 76 mg/dL (ref >=50)
LDL Cholesterol (Calc): 166 mg/dL (calc) — ABNORMAL HIGH
Non-HDL Cholesterol (Calc): 189 mg/dL (calc) — ABNORMAL HIGH (ref <130)
Total CHOL/HDL Ratio: 3.5 (calc) (ref <5.0)
Triglycerides: 116 mg/dL (ref <150)

## 2021-08-11 LAB — TSH: TSH: 1.86 mIU/L

## 2021-08-13 ENCOUNTER — Other Ambulatory Visit (HOSPITAL_COMMUNITY): Payer: Self-pay

## 2021-08-19 ENCOUNTER — Encounter: Payer: Self-pay | Admitting: Medical-Surgical

## 2021-08-19 ENCOUNTER — Encounter: Payer: Self-pay | Admitting: Family Medicine

## 2021-08-20 MED ORDER — NALTREXONE POWD: 3 refills | Status: DC

## 2021-08-24 ENCOUNTER — Encounter: Payer: Self-pay | Admitting: Medical-Surgical

## 2021-09-07 NOTE — Telephone Encounter (Signed)
Received Epic notification that pt has not read this message re:  BP.  Called pt and LVM asking that she either return our call to discuss or read her MyChart message and respond.  Charyl Bigger, CMA ?

## 2021-09-08 NOTE — Telephone Encounter (Signed)
Called pt and LVM asking that she either return our call to discuss or read her MyChart message and respond.  Charyl Bigger, CMA ?

## 2021-09-09 NOTE — Telephone Encounter (Signed)
Pt read message on 09/08/2021.  Awaiting pt response.  Charyl Bigger, CMA ?

## 2021-09-17 ENCOUNTER — Other Ambulatory Visit: Payer: Self-pay | Admitting: Medical-Surgical

## 2021-09-17 DIAGNOSIS — Z1231 Encounter for screening mammogram for malignant neoplasm of breast: Secondary | ICD-10-CM

## 2021-09-28 ENCOUNTER — Telehealth: Payer: Self-pay | Admitting: Medical-Surgical

## 2021-09-28 NOTE — Telephone Encounter (Signed)
Patient called to get schedule with [employer] to fill out FMLA paperwork but no openings. Would a same day slot be okay to use patient need forms filled out by April 11th please advise.  ?

## 2021-09-29 ENCOUNTER — Encounter: Payer: Self-pay | Admitting: Medical-Surgical

## 2021-09-29 NOTE — Telephone Encounter (Signed)
See note from [place]. Will have to wait for normal appt. Can call for cancellations to see if can get in sooner.  ?

## 2021-09-30 ENCOUNTER — Telehealth: Payer: No Typology Code available for payment source | Admitting: Physician Assistant

## 2021-09-30 ENCOUNTER — Encounter: Payer: Self-pay | Admitting: Physician Assistant

## 2021-09-30 ENCOUNTER — Other Ambulatory Visit: Payer: Self-pay | Admitting: Medical-Surgical

## 2021-09-30 ENCOUNTER — Telehealth: Payer: Self-pay | Admitting: Medical-Surgical

## 2021-09-30 DIAGNOSIS — R3989 Other symptoms and signs involving the genitourinary system: Secondary | ICD-10-CM

## 2021-09-30 MED ORDER — AMBULATORY NON FORMULARY MEDICATION: 0 refills | Status: DC

## 2021-09-30 MED ORDER — CEPHALEXIN 500 MG PO CAPS: 500.0000 mg | ORAL_CAPSULE | Freq: Two times a day (BID) | ORAL | 0 refills | Status: DC

## 2021-09-30 MED ORDER — CEPHALEXIN 500 MG PO CAPS: 500.0000 mg | ORAL_CAPSULE | Freq: Two times a day (BID) | ORAL | 0 refills | Status: AC

## 2021-09-30 NOTE — Telephone Encounter (Signed)
Called patient no answer. A voicemail was like letting patient know we did not have any opening and would have to wait for a cancellation.gh ?

## 2021-09-30 NOTE — Progress Notes (Signed)
I have spent 5 minutes in review of e-visit questionnaire, review and updating patient chart, medical decision making and response to patient.   Maritza Hosterman Cody Maeryn Mcgath, PA-C    

## 2021-09-30 NOTE — Telephone Encounter (Signed)
Pt states she is still waiting on her C-pap Rx for Auto pap & equipment to be sent to [facility] at Fax # [phone] and their Ph is [phone]. ? ?Please let patient know when this has been completed. ?

## 2021-09-30 NOTE — Progress Notes (Signed)
E-Visit for Urinary Problems ? ?We are sorry that you are not feeling well.  Here is how we plan to help! ? ?Based on what you shared with me it looks like you most likely have a simple urinary tract infection. ? ?A UTI (Urinary Tract Infection) is a bacterial infection of the bladder. ? ?Most cases of urinary tract infections are simple to treat but a key part of your care is to encourage you to drink plenty of fluids and watch your symptoms carefully. ? ?I have prescribed Keflex 500 mg twice a day for 7 days.  Your symptoms should gradually improve. Call us if the burning in your urine worsens, you develop worsening fever, back pain or pelvic pain or if your symptoms do not resolve after completing the antibiotic. ? ?Urinary tract infections can be prevented by drinking plenty of water to keep your body hydrated.  Also be sure when you wipe, wipe from front to back and don't hold it in!  If possible, empty your bladder every 4 hours. ? ?HOME CARE ?Drink plenty of fluids ?Compete the full course of the antibiotics even if the symptoms resolve ?Remember, when you need to go?go. Holding in your urine can increase the likelihood of getting a UTI! ?GET HELP RIGHT AWAY IF: ?You cannot urinate ?You get a high fever ?Worsening back pain occurs ?You see blood in your urine ?You feel sick to your stomach or throw up ?You feel like you are going to pass out ? ?MAKE SURE YOU  ?Understand these instructions. ?Will watch your condition. ?Will get help right away if you are not doing well or get worse. ? ? ?Thank you for choosing an e-visit. ? ?Your e-visit answers were reviewed by a board certified advanced clinical practitioner to complete your personal care plan. Depending upon the condition, your plan could have included both over the counter or prescription medications. ? ?Please review your pharmacy choice. Make sure the pharmacy is open so you can pick up prescription now. If there is a problem, you may contact your  provider through [employer] and have the prescription routed to another pharmacy.  Your safety is important to [place]. If you have drug allergies check your prescription carefully.  ? ?For the next 24 hours you can use MyChart to ask questions about today's visit, request a non-urgent call back, or ask for a work or school excuse. ?You will get an email in the next two days asking about your experience. I hope that your e-visit has been valuable and will speed your recovery. ? ?

## 2021-10-01 ENCOUNTER — Ambulatory Visit (INDEPENDENT_AMBULATORY_CARE_PROVIDER_SITE_OTHER): Payer: No Typology Code available for payment source | Admitting: Medical-Surgical

## 2021-10-01 ENCOUNTER — Encounter: Payer: Self-pay | Admitting: Medical-Surgical

## 2021-10-01 DIAGNOSIS — Z0289 Encounter for other administrative examinations: Secondary | ICD-10-CM

## 2021-10-01 DIAGNOSIS — E119 Type 2 diabetes mellitus without complications: Secondary | ICD-10-CM

## 2021-10-01 DIAGNOSIS — F900 Attention-deficit hyperactivity disorder, predominantly inattentive type: Secondary | ICD-10-CM

## 2021-10-01 DIAGNOSIS — M797 Fibromyalgia: Secondary | ICD-10-CM | POA: Diagnosis not present

## 2021-10-01 MED ORDER — TIRZEPATIDE 2.5 MG/0.5ML ~~LOC~~ SOAJ
2.5000 mg | SUBCUTANEOUS | 1 refills | Status: DC
  Filled 2021-10-01: qty 2, 28d supply, fill #0
  Filled 2021-11-03: qty 2, 28d supply, fill #1

## 2021-10-01 NOTE — Progress Notes (Signed)
Virtual Visit via Telephone ?  ?I connected with  Melanie [name]  on 10/01/21 by telephone/telehealth and verified that I am speaking with the correct person using two identifiers. ?  ?I discussed the limitations, risks, security and privacy concerns of performing an evaluation and management service by telephone, including the higher likelihood of inaccurate diagnosis and treatment, and the availability of in person appointments.  We also discussed the likely need of an additional face to face encounter for complete and high quality delivery of care.  I also discussed with the patient that there may be a patient responsible charge related to this service. The patient expressed understanding and wishes to proceed. ? ?Provider location is in medical facility. ?Patient location is at their home, different from provider location. ?People involved in care of the patient during this telehealth encounter were myself, my nurse/medical assistant, and my front office/scheduling team member. ? ?CC: FMLA paperwork ? ?HPI: ?Pleasant 57 year old female presenting via telephone to discuss completion of FMLA paperwork for fibromyalgia flares. Is requesting intermittent FMLA for use when having flares of her symptoms. 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 She is missing an average of 1-2 shifts per month but sometimes more. Each flare usually results in her missing 1-2 shifts. Would like FMLA in place for those flares to protect her job. Notes that her first missed shift was 6/00/4599 for this certification period.  ? ?ADHD- requesting a refill of Vyvanse 12m daily. No side effects and feels that the medication is working well. No change in weight, sleeping patterns, or appetite. ? ?DM- finally started the Mounjaro 2.581mweekly and has tolerated it well. Requesting a refill to continue the  medication until her follow up that is already scheduled.  ? ?Review of Systems: See HPI for pertinent positives and negatives.  ? ?Objective Findings:   ? ?General: Speaking full sentences, no audible heavy breathing.  Sounds alert and appropriately interactive.   ? ?Impression and Recommendations:   ? ?1. Encounter for completion of form with patient ?2. Fibromyalgia ?Reviewed frequency of flares and expected absences. Discussed job duties and limitations that come into play during a flare of symptoms. FMLA forms completed for faxing. Patient will pick up originals from our front office. ? ?3. Attention deficit hyperactivity disorder (ADHD), predominantly inattentive type ?Vyvanse prescription at the pharmacy already with a refill date of 10/09/21. Patient notified by MyChart message.  ? ?4. Type 2 diabetes mellitus without complication, without long-term current use of insulin ([place]?Continue Mounjaro 2.39m54meekly. Will evaluate for increase in dose at next visit.  ? ? ?I discussed the above assessment and treatment plan with the patient. The patient was provided an opportunity to ask questions and all were answered. The patient agreed with the plan and demonstrated an understanding of the instructions. ?  ?The patient was advised to call back or seek an in-person evaluation if the symptoms worsen or if the condition fails to improve as anticipated. ? ?28 minutes of non-face-to-face time was provided during this encounter. ? ?Return for follow up as scheduled in May. . ?___________________________________________ ?JoySamuel BoucheNP, APRN, FNP-BC ?Primary Care and Sports Medicine ?[place]? ?

## 2021-10-01 NOTE — Progress Notes (Signed)
Intermediate FMLA ? ?08/30/2021 Start date ? ?On going because of chronic condition. ? ? ? ? ?

## 2021-10-01 NOTE — Telephone Encounter (Signed)
I printed the Rx and faxed to [facility] at Fax # [phone]. ? ?Successful fax confirmation recieved ?

## 2021-10-02 ENCOUNTER — Other Ambulatory Visit (HOSPITAL_COMMUNITY): Payer: Self-pay

## 2021-10-05 ENCOUNTER — Encounter: Payer: Self-pay | Admitting: Family Medicine

## 2021-10-06 ENCOUNTER — Ambulatory Visit: Payer: Self-pay

## 2021-10-06 ENCOUNTER — Ambulatory Visit (INDEPENDENT_AMBULATORY_CARE_PROVIDER_SITE_OTHER): Payer: No Typology Code available for payment source | Admitting: Family Medicine

## 2021-10-06 VITALS — BP 142/88 | HR 76 | Ht 67.0 in | Wt 215.4 lb

## 2021-10-06 DIAGNOSIS — M25511 Pain in right shoulder: Secondary | ICD-10-CM

## 2021-10-06 DIAGNOSIS — G8929 Other chronic pain: Secondary | ICD-10-CM | POA: Diagnosis not present

## 2021-10-06 NOTE — Progress Notes (Signed)
? ?I, Melanie [name], LAT, ATC acting as a scribe for Melanie Leader, MD.  ? ?Melanie [name] is a 56 y.o. female who presents to [place] at [facility] today for R shoulder pain. Pt was previously seen by [name] on 08/05/21 and the visit was primarily focused on her R knee. She hasn't been seen regarding her R shoulder since 09/24/20. Today, pt reports R shoulder has worsened over the last several months. Pt locates pain to anterior aspect of the R shoulder and into her R upper arm w/ increased pain when flexing. Pt isn't taking the Celebrex as frequently. ? ?She has been treated with low-dose naltrexone regimen for her fibromyalgia/chronic pain which has been quite successful. ? ?Dx testing: 09/24/20 Labs ?04/18/20 R shoulder MRI  ? 03/18/19 R shoulder XR ? ?Pertinent review of systems: No fevers or chills ? ?Relevant historical information: Diabetes.  Fibromyalgia/chronic pain ? ? ?Exam:  ?BP (!) 142/88   Pulse 76   Ht 5' 7"  (1.702 m)   Wt 215 lb 6.4 oz (97.7 kg)   SpO2 97%   BMI 33.74 kg/m?  ?General: Well Developed, well nourished, and in no acute distress.  ? ?MSK: Right shoulder normal. ?Nontender. ?Normal motion pain with abduction.  Positive Hawkins and Neer's test.  Intact strength. ? ? ? ?Lab and Radiology Results ? ?Procedure: Real-time Ultrasound Guided Injection of right shoulder subacromial bursa ?Device: Philips Affiniti 50G ?Images permanently stored and available for review in PACS ?Ultrasound evaluation prior to injection reveals moderate subacromial bursitis ?Verbal informed consent obtained.  Discussed risks and benefits of procedure. Warned about infection, bleeding, hyperglycemia damage to structures among others. ?Patient expresses understanding and agreement ?Time-out conducted.   ?Noted no overlying erythema, induration, or other signs of local infection.   ?Skin prepped in a sterile fashion.   ?Local anesthesia: Topical Ethyl chloride.   ?With sterile technique  and under real time ultrasound guidance: 40 mg of Kenalog and 2 mL of Marcaine injected into subacromial bursa. Fluid seen entering the bursa.   ?Completed without difficulty   ?Pain immediately resolved suggesting accurate placement of the medication.   ?Advised to call if fevers/chills, erythema, induration, drainage, or persistent bleeding.   ?Images permanently stored and available for review in the ultrasound unit.  ?Impression: Technically successful ultrasound guided injection. ? ? ?EXAM: ?MRI OF THE RIGHT SHOULDER WITHOUT CONTRAST ?  ?TECHNIQUE: ?Multiplanar, multisequence MR imaging of the shoulder was performed. ?No intravenous contrast was administered. ?  ?COMPARISON:  Right shoulder x-rays dated March 18, 2019. ?  ?FINDINGS: ?Rotator cuff: Mild-to-moderate supraspinatus tendinosis with bursal ?surface fraying at the insertion. Small focus of low T2 and T1 ?signal in the anterior tendon at the insertion, consistent with ?calcium hydroxyapatite. The infraspinatus, teres minor, and ?subscapularis tendons are unremarkable. ?  ?Muscles: No atrophy or abnormal signal of the muscles of the rotator ?cuff. ?  ?Biceps long head:  Intact and normally positioned. ?  ?Acromioclavicular Joint: Mild arthropathy of the acromioclavicular ?joint. Type I acromion. Small amount of fluid in the ?subacromial/subdeltoid bursa. ?  ?Glenohumeral Joint: No joint effusion. No chondral defect. ?  ?Labrum: Grossly intact, but evaluation is limited by lack of ?intraarticular fluid. ?  ?Bones: No acute fracture or dislocation. No suspicious bone lesion. ?Reactive subcortical cystic changes in the posterior greater ?tuberosity. ?  ?Other: None. ?  ?IMPRESSION: ?1. Supraspinatus calcific tendinosis and bursal surface fraying at ?the insertion. No high-grade rotator cuff tear. ?2. Mild acromioclavicular osteoarthritis. ?3.  Mild subacromial/subdeltoid bursitis. ?  ?  ?Electronically Signed ?  By: [name] [name] ?  On:  04/19/2020 07:21 ?  ?I, Melanie [name], personally (independently) visualized and performed the interpretation of the images attached in this note. ? ? ? ? ? ?Assessment and Plan: ?56 y.o. female with right shoulder pain thought to be due to shoulder impingement and subacromial bursitis.  Patient had significant improvement with subacromial injection in clinic today.  Plan for continued home exercise program and recheck back as needed. ? ?Of note her chronic pain/fibromyalgia pain has improved significantly with the low-dose naltrexone regimen.  This seems to be successful. ? ? ?PDMP not reviewed this encounter. ?Orders Placed This Encounter  ?Procedures  ? [place] LIMITED JOINT SPACE STRUCTURES UP RIGHT(NO LINKED CHARGES)  ?  Order Specific Question:   Reason for Exam (SYMPTOM  OR DIAGNOSIS REQUIRED)  ?  Answer:   right shoulder pain  ?  Order Specific Question:   Preferred imaging location?  ?  Answer:   [place]  ? ?No orders of the defined types were placed in this encounter. ? ? ? ?Discussed warning signs or symptoms. Please see discharge instructions. Patient expresses understanding. ? ? ?The above documentation has been reviewed and is accurate and complete Melanie [name], [name] ? ? ?

## 2021-10-06 NOTE — Patient Instructions (Addendum)
Thank you for coming in today.  ? ?You received steroid injection in your right shoulder today. Seek immediate medical attention if the joint becomes red, extremely painful, or is oozing fluid.  ? ?Recheck back as needed ?

## 2021-10-07 ENCOUNTER — Other Ambulatory Visit (HOSPITAL_COMMUNITY): Payer: Self-pay

## 2021-10-21 ENCOUNTER — Ambulatory Visit (INDEPENDENT_AMBULATORY_CARE_PROVIDER_SITE_OTHER): Payer: No Typology Code available for payment source

## 2021-10-21 DIAGNOSIS — Z1231 Encounter for screening mammogram for malignant neoplasm of breast: Secondary | ICD-10-CM

## 2021-11-03 ENCOUNTER — Ambulatory Visit (INDEPENDENT_AMBULATORY_CARE_PROVIDER_SITE_OTHER): Payer: No Typology Code available for payment source | Admitting: Family Medicine

## 2021-11-03 ENCOUNTER — Other Ambulatory Visit (HOSPITAL_COMMUNITY): Payer: Self-pay

## 2021-11-03 VITALS — BP 146/96 | HR 82 | Ht 67.0 in | Wt 214.0 lb

## 2021-11-03 DIAGNOSIS — M5412 Radiculopathy, cervical region: Secondary | ICD-10-CM

## 2021-11-03 MED ORDER — PREDNISONE 50 MG PO TABS
50.0000 mg | ORAL_TABLET | Freq: Every day | ORAL | 0 refills | Status: DC
  Filled 2021-11-03: qty 5, 5d supply, fill #0

## 2021-11-03 MED ORDER — OXYCODONE-ACETAMINOPHEN 5-325 MG PO TABS
1.0000 | ORAL_TABLET | Freq: Three times a day (TID) | ORAL | 0 refills | Status: DC | PRN
  Filled 2021-11-03: qty 30, 10d supply, fill #0

## 2021-11-03 NOTE — Progress Notes (Signed)
? ?I, [name], LAT, ATC acting as a scribe for [name], MD. ? ?Melanie [name] is a 56 y.o. female who presents to [place] at [facility] today for cont R arm pain. She has been treated with low-dose naltrexone regimen for her fibromyalgia/chronic pain. Pt was last seen by [name] on 10/06/21 and the visit primarily focused on her R shoulder and she was given a R subacromial steroid injection and advised to cont HEP. Pt's last cervical ESI was performed on 06/04/21. Today, pt reports she is on the verge of losing use of her R arm. Increased pain w/ cervical flexion or R-side rotation. Pt locates pain all over the the R scapular area, R arm, wrist, and hand. Pt states that she's in "excruciating pain" and can't even find any comfortable position. ? ?She is quite a lot of pain at the lateral elbow and is worried that she has a pinched nerve in this area. ? ?Pt reports she is only taking the Celebrex prn. ? ?Dx testing: 09/24/20 Labs ?04/18/20 R shoulder MRI  ?            03/18/19 R shoulder XR ? ?Pertinent review of systems: No fevers or chills ? ?Relevant historical information: Diabetes.  Fibromyalgia.  Tunnel syndrome confirmed on nerve conduction study November 2022. ? ? ?Exam:  ?BP (!) 146/96   Pulse 82   Ht 5' 7"  (1.702 m)   Wt 214 lb (97.1 kg)   BMI 33.52 kg/m?  ?General: Well Developed, well nourished, and in no acute distress.  ? ?MSK: C-spine: Normal. ?Nontender midline. ?Normal cervical motion. ?Positive right-sided Spurling's test. ?Upper extremity strength is intact. ? ?Right elbow: Tender palpation lateral elbow. ?Normal elbow motion. ?Knee extension strength is intact but does reproduce some pain. ? ? ? ?Lab and Radiology Results ? ?EXAM: ?MRI CERVICAL SPINE WITHOUT CONTRAST ?  ?TECHNIQUE: ?Multiplanar, multisequence MR imaging of the cervical spine was ?performed. No intravenous contrast was administered. ?  ?COMPARISON:  None. ?  ?FINDINGS: ?Alignment: Mild  anterolisthesis C3-4 and C4-5. Mild retrolisthesis ?C5-6 and C6-7 with straightening of the cervical lordosis. ?  ?Vertebrae: Negative for fracture or mass.  Normal bone marrow ?  ?Cord: Normal signal and morphology of the spinal cord ?  ?Posterior Fossa, vertebral arteries, paraspinal tissues: Negative ?  ?Disc levels: ?  ?C2-3: Negative ?  ?C3-4: Small central disc protrusion without significant stenosis. ?  ?C4-5: Small central disc protrusion.  Negative for stenosis. ?  ?C5-6: Disc degeneration and diffuse uncinate spurring. Moderate ?foraminal narrowing on the right and mild foraminal narrowing on the ?left due to spurring. Mild spinal stenosis. ?  ?C6-7: Disc degeneration and spondylosis with diffuse uncinate ?spurring. Moderate right foraminal encroachment and mild left ?foraminal encroachment due to spurring ?  ?C7-T1: Negative ?  ?IMPRESSION: ?Multilevel degenerative change in the cervical spine as above. ?Moderate right foraminal encroachment C5-6 and C6-7 due to spurring. ?Mild left foraminal encroachment C5-6 and C6-7 due to spurring. ?  ?  ?Electronically Signed ?  By: Franchot Gallo [name] ?  On: 12/18/2018 10:06 ? ?I, [name], personally (independently) visualized and performed the interpretation of the images attached in this note. ? ?  ?Nerve conduction study dated May 10, 2021. ?Patient: Melanie [name] DOB: 08-30-1965 Physician: Narda Amber, DO  ?Sex: Female Height: 5' 7"  Ref Phys: [name], [name]  ?ID#: [id number]     Technician:    ?  ?Patient Complaints: ?This is a 56 year old female  referred for evaluation of right arm paresthesias. ?  ?NCV & EMG Findings: ?Extensive electrodiagnostic testing of the right upper extremity shows:  ?Right median sensory response shows prolonged latency (3.8 ms).  Right ulnar sensory responses within normal limits.  ?Right median and ulnar motor responses are within normal limits.   ?There is no evidence of active or chronic motor axonal loss changes  affecting any of the tested muscles.  Motor unit configuration and recruitment pattern is within normal limits.   ?  ?Impression: ?Right median neuropathy at or distal to the wrist (mild), consistent with a clinical diagnosis of carpal tunnel syndrome.   ?There is no evidence of a cervical radiculopathy affecting the right upper extremity. ?  ?  ?___________________________ ?Narda Amber, DO ? ? ? ?Assessment and Plan: ?56 y.o. female with  ?Severe right arm pain thought to be primarily due to cervical radiculopathy.  Plan for repeat epidural steroid injection.  If this is not as effective as we would like we will repeat cervical MRI is the most recent MRI is old at this point.  She had good response to the last epidural steroid injection C-spine in December. ? ?Additionally there is some possibility that some of her pain is due to lateral epicondylitis or perhaps radial tunnel syndrome and if not getting good response to the epidural steroid injection she should return to clinic and we will consider either lateral epicondyles injection or radial tunnel injection. ? ?In terms of short-term pain control she needs help now.  We will use short course of prednisone and have backup plan for oxycodone for pain control.  She does take low-dose Naltrexone as part of a fibromyalgia regimen that is been working quite well for her.  She obviously need to stop the naltrexone prior to starting oxycodone.  She is aware of this and is prepared to if needed. ? ?She will keep me updated with how things go. ? ? ?PDMP reviewed during this encounter. ?Orders Placed This Encounter  ?Procedures  ? DG INJECT DIAG/THERA/INC NEEDLE/CATH/PLC EPI/CERV/THOR W/IMG  ?  Standing Status:   Future  ?  Standing Expiration Date:   12/04/2021  ?  Order Specific Question:   Reason for Exam (SYMPTOM  OR DIAGNOSIS REQUIRED)  ?  Answer:   cervical radiculopathy  ?  Order Specific Question:   Preferred Imaging Location?  ?  Answer:   [id number]  ?   Order Specific Question:   Is the patient pregnant?  ?  Answer:   No  ? ?Meds ordered this encounter  ?Medications  ? oxyCODONE-acetaminophen (PERCOCET/ROXICET) 5-325 MG tablet  ?  Sig: Take 1 tablet by mouth every 8 (eight) hours as needed for severe pain.  ?  Dispense:  30 tablet  ?  Refill:  0  ? predniSONE (DELTASONE) 50 MG tablet  ?  Sig: Take 1 tablet (50 mg total) by mouth daily.  ?  Dispense:  5 tablet  ?  Refill:  0  ? ? ? ?Discussed warning signs or symptoms. Please see discharge instructions. Patient expresses understanding. ? ? ?The above documentation has been reviewed and is accurate and complete [name], [name] ? ? ?

## 2021-11-03 NOTE — Patient Instructions (Signed)
Thank you for coming in today.  ? ?Please call [facility] at [phone] to schedule your spine injection.   ? ?Recheck back after epidural or around 2 weeks ?

## 2021-11-08 ENCOUNTER — Encounter: Payer: Self-pay | Admitting: Medical-Surgical

## 2021-11-08 ENCOUNTER — Encounter: Payer: Self-pay | Admitting: Family Medicine

## 2021-11-08 ENCOUNTER — Ambulatory Visit (INDEPENDENT_AMBULATORY_CARE_PROVIDER_SITE_OTHER): Payer: No Typology Code available for payment source | Admitting: Medical-Surgical

## 2021-11-08 ENCOUNTER — Other Ambulatory Visit (HOSPITAL_COMMUNITY): Payer: Self-pay

## 2021-11-08 ENCOUNTER — Telehealth: Payer: No Typology Code available for payment source | Admitting: Medical-Surgical

## 2021-11-08 DIAGNOSIS — F900 Attention-deficit hyperactivity disorder, predominantly inattentive type: Secondary | ICD-10-CM | POA: Diagnosis not present

## 2021-11-08 DIAGNOSIS — F411 Generalized anxiety disorder: Secondary | ICD-10-CM

## 2021-11-08 DIAGNOSIS — I10 Essential (primary) hypertension: Secondary | ICD-10-CM

## 2021-11-08 DIAGNOSIS — Z9989 Dependence on other enabling machines and devices: Secondary | ICD-10-CM

## 2021-11-08 DIAGNOSIS — E119 Type 2 diabetes mellitus without complications: Secondary | ICD-10-CM

## 2021-11-08 DIAGNOSIS — M5412 Radiculopathy, cervical region: Secondary | ICD-10-CM

## 2021-11-08 DIAGNOSIS — F331 Major depressive disorder, recurrent, moderate: Secondary | ICD-10-CM

## 2021-11-08 DIAGNOSIS — G4733 Obstructive sleep apnea (adult) (pediatric): Secondary | ICD-10-CM

## 2021-11-08 DIAGNOSIS — Z9109 Other allergy status, other than to drugs and biological substances: Secondary | ICD-10-CM

## 2021-11-08 MED ORDER — AMBULATORY NON FORMULARY MEDICATION: 0 refills | Status: AC

## 2021-11-08 MED ORDER — IPRATROPIUM BROMIDE 0.03 % NA SOLN
NASAL | 2 refills | Status: DC
  Filled 2021-11-08: qty 30, 86d supply, fill #0
  Filled 2022-01-31: qty 30, 25d supply, fill #0
  Filled 2022-03-14: qty 30, 25d supply, fill #1
  Filled 2022-05-31: qty 30, 25d supply, fill #2

## 2021-11-08 MED ORDER — LISDEXAMFETAMINE DIMESYLATE 60 MG PO CAPS
60.0000 mg | ORAL_CAPSULE | ORAL | 0 refills | Status: DC
Start: 2021-11-08 — End: 2022-01-28
  Filled 2021-11-08: qty 30, 30d supply, fill #0

## 2021-11-08 MED ORDER — LOSARTAN POTASSIUM 50 MG PO TABS
50.0000 mg | ORAL_TABLET | Freq: Every day | ORAL | 1 refills | Status: DC
  Filled 2021-11-08: qty 90, 90d supply, fill #0
  Filled 2022-01-31: qty 90, 90d supply, fill #1

## 2021-11-08 MED ORDER — LISDEXAMFETAMINE DIMESYLATE 60 MG PO CAPS
60.0000 mg | ORAL_CAPSULE | Freq: Every day | ORAL | 0 refills | Status: DC
  Filled 2022-01-31: qty 30, 30d supply, fill #0

## 2021-11-08 MED ORDER — LISDEXAMFETAMINE DIMESYLATE 60 MG PO CAPS: 60.0000 mg | ORAL_CAPSULE | ORAL | 0 refills | Status: DC

## 2021-11-08 MED ORDER — TIRZEPATIDE 5 MG/0.5ML ~~LOC~~ SOAJ
5.0000 mg | SUBCUTANEOUS | 0 refills | Status: DC
  Filled 2021-11-08: qty 6, 84d supply, fill #0

## 2021-11-08 NOTE — Progress Notes (Signed)
Virtual Visit via Video Note ? ?I connected with Melanie [name] on 11/08/21 at  3:00 PM EDT by a video enabled telemedicine application and verified that I am speaking with the correct person using two identifiers. ?  ?I discussed the limitations of evaluation and management by telemedicine and the availability of in person appointments. The patient expressed understanding and agreed to proceed. ? ?Patient location: home ?Provider locations: office ? ?Subjective:   ? ?CC: Chronic disease follow-up ? ?HPI: ?Pleasant 56 year old female presenting via MyChart video visit today for the following: ? ?Hypertension-taking losartan 25 mg daily, tolerating well without side effects. Not checking BP at home regularly. Readings have been high at recent MD appointments but she feels this is a reflection of her intense pain from fibromyalgia and her right neck/shoulder/arm issues.  ? ?Diabetes- not checking sugars at home. Stopped Berberine. Currently taking Mounjaro 2.35m weekly but interested in going up to the next dose. Would like to have Zofran prn on hand in case of nausea with increased dose.  ? ?ADHD-taking Vyvanse 60 mg daily, tolerating well without side effects. Feels the medication works fairly well at this dose. No changes in sleeping pattern, appetite, or unintentional weight fluctuations.  ? ?Mood- stopped Wellbutrin when she started Naltrexone but since she was prescribed pain medications for her right neck/shoulder, she has come off Naltrexone. Feels she is okay for now but will let me know if this changes.  ? ?OSA- has still been fighting to get a new CPAP machine as hers is outdated. Now has a plan to use her FSA money to buy one out of pocket since she is having issues finding a DME company in network with insurance who will provide one without renting it for nearly a year.  ? ?Past medical history, Surgical history, Family history not pertinant except as noted below, Social history, Allergies, and  medications have been entered into the medical record, reviewed, and corrections made.  ? ?Review of Systems: See HPI for pertinent positives and negatives.  ? ?Objective:   ? ?General: Speaking clearly in complete sentences without any shortness of breath.  Alert and oriented x3.  Normal judgment. No apparent acute distress. ? ?Impression and Recommendations:   ? ?1. Type 2 diabetes mellitus without complication, without long-term current use of insulin ([place] ?Increasing Mounjaro to 577mweekly. Zofran every 8 hours prn. Recommend monitoring fasting blood sugars twice weekly with a goal of 120 or less.  ? ?2. Essential hypertension ?BP elevated at all documented visits for the past several months. Increasing Losartan to 5037maily. Recommend checking BP at home with a goal of 130/80 or less. Low sodium diet.  ?- losartan (COZAAR) 50 MG tablet; Take 1 tablet (50 mg total) by mouth daily.  Dispense: 90 tablet; Refill: 1 ? ?3. Attention deficit hyperactivity disorder (ADHD), predominantly inattentive type ?Continue Vyvanse 76m49mily.  ?- lisdexamfetamine (VYVANSE) 60 MG capsule; Take 1 capsule by mouth daily.  Dispense: 30 capsule; Refill: 0 ?- lisdexamfetamine (VYVANSE) 60 MG capsule; Take 1 capsule by mouth every morning. (09/09/21)  Dispense: 30 capsule; Refill: 0 ?- lisdexamfetamine (VYVANSE) 60 MG capsule; Take 1 capsule (60 mg total) by mouth every morning. (10/09/21)  Dispense: 30 capsule; Refill: 0 ? ?4. GAD (generalized anxiety disorder) ?5. Moderate episode of recurrent major depressive disorder (HCC)[employer] for now. She will let me know if she decides that she needs help managing.  ? ?6. Environmental allergies ?Refilling Atrovent per patient request.  ?- ipratropium (ATROVENT)  0.03 % nasal spray; PLACE 2 SPRAYS INTO BOTH NOSTRILS TWO TIMES DAILY  Dispense: 30 mL; Refill: 2 ? ?7. OSA on CPAP ?Generic CPAP prescription printed, signed, and placed in the folder at the front desk for patient pick up. ? ?I  discussed the assessment and treatment plan with the patient. The patient was provided an opportunity to ask questions and all were answered. The patient agreed with the plan and demonstrated an understanding of the instructions. ?  ?The patient was advised to call back or seek an in-person evaluation if the symptoms worsen or if the condition fails to improve as anticipated. ? ?25 minutes of non-face-to-face time was provided during this encounter. ? ?Return for annual physical exam at your convenience. ? ?Clearnce Sorrel, DNP, APRN, FNP-BC ?[place] ?Primary Care and Sports Medicine ?

## 2021-11-08 NOTE — Progress Notes (Signed)
Ordered MRI and physical therapy ?

## 2021-11-09 NOTE — Progress Notes (Signed)
Patient has been scheduled for annual visit per your request on 12/17/21. ?

## 2021-11-10 ENCOUNTER — Other Ambulatory Visit (HOSPITAL_COMMUNITY): Payer: Self-pay

## 2021-11-10 ENCOUNTER — Ambulatory Visit: Payer: Self-pay

## 2021-11-10 ENCOUNTER — Ambulatory Visit (INDEPENDENT_AMBULATORY_CARE_PROVIDER_SITE_OTHER): Payer: No Typology Code available for payment source | Admitting: Family Medicine

## 2021-11-10 VITALS — BP 150/110 | HR 77 | Ht 67.0 in | Wt 214.0 lb

## 2021-11-10 DIAGNOSIS — M79601 Pain in right arm: Secondary | ICD-10-CM

## 2021-11-10 DIAGNOSIS — R202 Paresthesia of skin: Secondary | ICD-10-CM | POA: Diagnosis not present

## 2021-11-10 DIAGNOSIS — M5412 Radiculopathy, cervical region: Secondary | ICD-10-CM | POA: Diagnosis not present

## 2021-11-10 MED ORDER — PREDNISONE 50 MG PO TABS
50.0000 mg | ORAL_TABLET | Freq: Every day | ORAL | 0 refills | Status: DC
  Filled 2021-11-10: qty 5, 5d supply, fill #0

## 2021-11-10 NOTE — Progress Notes (Signed)
? ?I, Judy Pimple, am serving as a Education administrator for Dr. [name]. ? ?Melanie [name] is a 56 y.o. female who presents to [place] [facility] today for continued R arm pain due to cervical radiculopathy. Pt was last seen by [name] on 11/03/21 and repeat cervical ESI was ordered and she was prescribed prednisone and oxycodone. ESI is scheduled for 5/16 and c-spine MRI for 5/13. Today, pt reports that she has not taken any medication or steroids today since she was getting the injection today. The pain get more intense as the day goes on. ? ?Dx testing: 09/24/20 Labs ?04/18/20 R shoulder MRI  ?            03/18/19 R shoulder XR ? ?Pertinent review of systems: no fever or chills ? ?Relevant historical information: DM2, fibromyalgia ? ? ?Exam:  ?BP (!) 150/110   Pulse 77   Ht 5' 7"  (1.702 m)   Wt 214 lb (97.1 kg)   SpO2 98%   BMI 33.52 kg/m?  ?General: Well Developed, well nourished, and in no acute distress.  ? ?MSK: Right forearm: Normal-appearing ?Tender palpation at lateral epicondyles and dorsal lateral forearm radial tunnel area. ? ? ? ? ?Lab and Radiology Results ? ?Procedure: Real-time Ultrasound Guided Injection of right forearm radial tunnel ?Device: Philips Affiniti 50G ?Images permanently stored and available for review in PACS ?Verbal informed consent obtained.  Discussed risks and benefits of procedure. Warned about infection, bleeding, hyperglycemia damage to structures among others. ?Patient expresses understanding and agreement ?Time-out conducted.   ?Noted no overlying erythema, induration, or other signs of local infection.   ?Skin prepped in a sterile fashion.   ?Local anesthesia: Topical Ethyl chloride.   ?With sterile technique and under real time ultrasound guidance: 40 mg of Kenalog and 2 mL of lidocaine injected into the radial tunnel. Fluid seen entering the radial tunnel.   ?Completed without difficulty   ?Pain moderately resolved suggesting accurate  placement of the medication.   ?Advised to call if fevers/chills, erythema, induration, drainage, or persistent bleeding.   ?Images permanently stored and available for review in the ultrasound unit.  ?Impression: Technically successful ultrasound guided injection. ? ? ? ? ? ? ? ? ?Assessment and Plan: ?56 y.o. female with right groin pain.  Pain is thought to be multifactorial. ?I think a lot of her pain is cervical radiculopathy.  She has an epidural steroid injection scheduled and an MRI C-spine scheduled in the near future. ? ?Additionally plan for radial tunnel injection today as I do think that is part of her pain.  Additionally pain could be lateral epicondylitis. ? ?We also talked about work.  She is functionally unable to work at this point.  We are completing paperwork for FMLA and short-term disability at this time as well. ? ?Total encounter time 30 minutes including face-to-face time with the patient and, reviewing past medical record, and charting on the date of service.  Time-based billing excludes time for the injection. ?Discussion treatment plan and options. ? ? ?PDMP not reviewed this encounter. ?Orders Placed This Encounter  ?Procedures  ? [place] LIMITED JOINT SPACE STRUCTURES UP RIGHT(NO LINKED CHARGES)  ?  Standing Status:   Future  ?  Number of Occurrences:   1  ?  Standing Expiration Date:   05/13/2022  ?  Order Specific Question:   Reason for Exam (SYMPTOM  OR DIAGNOSIS REQUIRED)  ?  Answer:   right arm pain  ?  Order  Specific Question:   Preferred imaging location?  ?  Answer:   [place]  ? ?Meds ordered this encounter  ?Medications  ? predniSONE (DELTASONE) 50 MG tablet  ?  Sig: Take 1 tablet (50 mg total) by mouth daily.  ?  Dispense:  5 tablet  ?  Refill:  0  ? ? ? ?Discussed warning signs or symptoms. Please see discharge instructions. Patient expresses understanding. ? ? ?The above documentation has been reviewed and is accurate and complete [name],  [name] ? ? ?

## 2021-11-10 NOTE — Patient Instructions (Addendum)
Thank you for coming in today.  ? ?You received an injection today. Seek immediate medical attention if the joint becomes red, extremely painful, or is oozing fluid.  ? ?Let's see how this goes ? ?Let's see what the MRI says and then decide on our treatment plan ?

## 2021-11-13 ENCOUNTER — Ambulatory Visit (INDEPENDENT_AMBULATORY_CARE_PROVIDER_SITE_OTHER): Payer: No Typology Code available for payment source

## 2021-11-13 DIAGNOSIS — M5412 Radiculopathy, cervical region: Secondary | ICD-10-CM | POA: Diagnosis not present

## 2021-11-16 ENCOUNTER — Ambulatory Visit
Admission: RE | Admit: 2021-11-16 | Discharge: 2021-11-16 | Disposition: A | Discharge status: Home / Self Care | Payer: No Typology Code available for payment source | Source: Ambulatory Visit | Attending: Family Medicine | Admitting: Family Medicine

## 2021-11-16 DIAGNOSIS — M5412 Radiculopathy, cervical region: Secondary | ICD-10-CM

## 2021-11-16 MED ORDER — IOPAMIDOL (ISOVUE-M 300) INJECTION 61%
1.0000 mL | Freq: Once | INTRAMUSCULAR | Status: AC | PRN
  Administered 2021-11-16: 1 mL via EPIDURAL

## 2021-11-16 MED ORDER — TRIAMCINOLONE ACETONIDE 40 MG/ML IJ SUSP (RADIOLOGY)
60.0000 mg | Freq: Once | INTRAMUSCULAR | Status: AC
  Administered 2021-11-16: 60 mg via EPIDURAL

## 2021-11-16 NOTE — Discharge Instructions (Signed)

## 2021-11-16 NOTE — Progress Notes (Signed)
Cervical MRI shows neuroforaminal narrowing at C5-6 and C6-7 progressed since your last MRI.  This would explain your symptoms.  Proceed to epidural steroid injection.

## 2021-12-08 ENCOUNTER — Encounter: Payer: Self-pay | Admitting: Family Medicine

## 2021-12-13 NOTE — Telephone Encounter (Signed)
Patient called in regards to her message below. She is wanting to try a non steroid injection (but it was denied last time by her insurance). Can we try to run it again or is there anything else she can do? She did schedule an appointment to be seen tomorrow and for possible injections.

## 2021-12-14 ENCOUNTER — Ambulatory Visit: Payer: Self-pay

## 2021-12-14 ENCOUNTER — Ambulatory Visit (INDEPENDENT_AMBULATORY_CARE_PROVIDER_SITE_OTHER): Payer: No Typology Code available for payment source | Admitting: Family Medicine

## 2021-12-14 VITALS — BP 152/96 | HR 100 | Ht 67.0 in | Wt 215.6 lb

## 2021-12-14 DIAGNOSIS — M5412 Radiculopathy, cervical region: Secondary | ICD-10-CM | POA: Diagnosis not present

## 2021-12-14 DIAGNOSIS — M25561 Pain in right knee: Secondary | ICD-10-CM | POA: Diagnosis not present

## 2021-12-14 DIAGNOSIS — R202 Paresthesia of skin: Secondary | ICD-10-CM | POA: Diagnosis not present

## 2021-12-14 DIAGNOSIS — G8929 Other chronic pain: Secondary | ICD-10-CM

## 2021-12-14 NOTE — Progress Notes (Unsigned)
I, [name], LAT, ATC acting as a scribe for [name], MD.  Melanie [name] is a 56 y.o. female who presents to [place] at [facility] today for R knee pain. Pt was last seen by [name] on 11/10/21 for R arm pain and was given a R radial tunnel steroid injection. Pt sent a MyChart message on 6/7 w/ a "status update" to electronically discuss numerous issues and c/o increase knee pain. We are currently completing pt's paperwork for FMLA and short-term disability. Pt had a R knee Zilretta injection on 08/05/21. Pt reports R knee started hurting again over the last 1-2 months. Pt locates pain to all over the R knee. Pt c/o R knee pain is effective the whole R extremity.  Knee swelling: yes- has improved Mechanical symptoms: yes Aggravates: being on feet, walking, knee flexion Treatments tried: topical creams, LDN, Celebrex  She also notes pain radiating down the right arm but to be cervical radiculopathy.  She has had epidural steroid injection which helped some.  She is interested in a repeat injection and surgical consultation as well.  Dx testing: 09/24/20 Labs 09/24/20 L-spine XR 04/18/20 R knee MRI  03/18/19 R & L knee XR  Pertinent review of systems: No fevers or chills  Relevant historical information: Diabetes   Exam:  BP (!) 152/96   Pulse 100   Ht 5' 7"  (1.702 m)   Wt 215 lb 9.6 oz (97.8 kg)   SpO2 96%   BMI 33.77 kg/m  General: Well Developed, well nourished, and in no acute distress.   MSK: Right knee: Moderate effusion. Normal motion of crepitation. Tender palpation medial and lateral joint line.  C-spine: Normal. Nontender midline. Decreased cervical motion.  Lab and Radiology Results  Procedure: Real-time Ultrasound Guided Injection of right knee superior lateral patellar space Device: Philips Affiniti 50G Images permanently stored and available for review in PACS Verbal informed consent obtained.  Discussed risks and  benefits of procedure. Warned about infection, bleeding, hyperglycemia damage to structures among others. Patient expresses understanding and agreement Time-out conducted.   Noted no overlying erythema, induration, or other signs of local infection.   Skin prepped in a sterile fashion.   Local anesthesia: Topical Ethyl chloride.   With sterile technique and under real time ultrasound guidance: Zilretta 32 mg injected into knee joint. Fluid seen entering the joint capsule.   Completed without difficulty   Advised to call if fevers/chills, erythema, induration, drainage, or persistent bleeding.   Images permanently stored and available for review in the ultrasound unit.  Impression: Technically successful ultrasound guided injection. Lot number: [id number]    EXAM: MRI CERVICAL SPINE WITHOUT CONTRAST   TECHNIQUE: Multiplanar, multisequence MR imaging of the cervical spine was performed. No intravenous contrast was administered.   COMPARISON:  MRI of the cervical spine December 17, 2018.   FINDINGS: Alignment: Physiologic.   Vertebrae: No fracture, evidence of discitis, or bone lesion.   Cord: Normal signal and morphology.   Posterior Fossa, vertebral arteries, paraspinal tissues: Negative.   Disc levels:   C2-3: No spinal canal or neural foraminal stenosis.   C3-4: Small central disc protrusion mild facet degenerative changes without significant spinal canal or neural foraminal stenosis.   C4-5: Small central disc protrusion mild facet degenerative changes without significant spinal canal or neural foraminal stenosis.   C5-6: Posterior disc osteophyte complex without significant spinal canal stenosis. Uncovertebral and facet degenerative changes resulting in severe right and moderate left neural foraminal  narrowing, similar to mildly progressed since prior MRI.   C6-7: Posterior disc osteophyte complex without significant spinal canal stenosis. Uncovertebral and facet  degenerative changes resulting in severe right and moderate left neural foraminal narrowing, progressed since prior MRI.   C7-T1: No spinal canal or neural foraminal stenosis.   IMPRESSION: 1. Degenerative changes of the cervical spine with high-grade bilateral neural foraminal narrowing at C5-6 and C6-7, progressed since prior MRI. 2. No high-grade spinal canal stenosis.     Electronically Signed   By: Pedro Earls [name]   On: 11/15/2021 14:57 I, [name], personally (independently) visualized and performed the interpretation of the images attached in this note.      Assessment and Plan: 56 y.o. female with right knee pain thought to be due to exacerbation of DJD.  Plan for repeat Zilretta injection.  Last injection was 4 months ago.  This seems to be working reasonably well.  Cervical radiculopathy.  Last had an epidural steroid injection May 16.  Repeat epidural steroid injection ordered today.  Additionally she is interested in surgical consultation to discuss options.  This is reasonable.  Refer to Dr. Lynann Bologna for surgical discussion.  She also does have some right arm pain thought to be radial tunnel syndrome.  This is doing better after an injection.   PDMP not reviewed this encounter. Orders Placed This Encounter  Procedures   [place] LIMITED JOINT SPACE STRUCTURES LOW RIGHT(NO LINKED CHARGES)    Order Specific Question:   Reason for Exam (SYMPTOM  OR DIAGNOSIS REQUIRED)    Answer:   right knee pain    Order Specific Question:   Preferred imaging location?    Answer:   [place]   DG INJECT DIAG/THERA/INC NEEDLE/CATH/PLC EPI/CERV/THOR [facility]    Standing Status:   Future    Standing Expiration Date:   01/13/2022    Order Specific Question:   Reason for Exam (SYMPTOM  OR DIAGNOSIS REQUIRED)    Answer:   cervical radiculopathy    Order Specific Question:   Preferred Imaging Location?    Answer:   [facility]    Order  Specific Question:   Is the patient pregnant?    Answer:   No   Ambulatory referral to Orthopedic Surgery    Referral Priority:   Routine    Referral Type:   Surgical    Referral Reason:   Specialty Services Required    Referred to Provider:   Phylliss Bob, MD    Requested Specialty:   Orthopedic Surgery    Number of Visits Requested:   1   No orders of the defined types were placed in this encounter.    Discussed warning signs or symptoms. Please see discharge instructions. Patient expresses understanding.   The above documentation has been reviewed and is accurate and complete [name], [name]

## 2021-12-14 NOTE — Patient Instructions (Addendum)
Thank you for coming in today.   You received an injection today. Seek immediate medical attention if the joint becomes red, extremely painful, or is oozing fluid.   I've referred you to Orthopedic Surgery.  Let us know if you don't hear from them in one week.--- Dr. Lynann Bologna  Please call [facility] at [phone] to schedule your spine injection.    Dr. Sammuel Hines would also be a good surgical option  Check back as needed

## 2021-12-17 ENCOUNTER — Encounter: Payer: Self-pay | Admitting: Medical-Surgical

## 2021-12-17 ENCOUNTER — Ambulatory Visit (INDEPENDENT_AMBULATORY_CARE_PROVIDER_SITE_OTHER): Payer: No Typology Code available for payment source | Admitting: Medical-Surgical

## 2021-12-17 VITALS — BP 124/76 | HR 72 | Ht 67.0 in | Wt 214.0 lb

## 2021-12-17 DIAGNOSIS — R21 Rash and other nonspecific skin eruption: Secondary | ICD-10-CM

## 2021-12-17 DIAGNOSIS — M2550 Pain in unspecified joint: Secondary | ICD-10-CM | POA: Diagnosis not present

## 2021-12-17 DIAGNOSIS — Z0000 Encounter for general adult medical examination without abnormal findings: Secondary | ICD-10-CM

## 2021-12-17 NOTE — Progress Notes (Signed)
Complete physical exam  Patient: Melanie [name]   DOB: 18-Nov-1965   56 y.o. Female  MRN: [id number]  Subjective:    Chief Complaint  Patient presents with   Annual Exam    Melanie [name] is a 56 y.o. female who presents today for a complete physical exam. She reports consuming a general diet.  Doing physical therapy.   She generally feels poorly. She reports sleeping poorly. She does have additional problems to discuss today.   Requesting a referral to dermatology to discuss frequent rashes and general skin survey.  Requesting referral to rheumatology due to polyarthralgia.  Previously saw a rheumatology however notes that she was dismissed.  Most recent fall risk assessment:    06/07/2021    9:51 AM  [place] in the past year? 0  Number falls in past yr: 0  Injury with Fall? 0  Risk for fall due to : No Fall Risks  Follow up Falls evaluation completed     Most recent depression screenings:    12/17/2021    3:07 PM 06/07/2021    9:51 AM  PHQ 2/9 Scores  PHQ - 2 Score 1 0  PHQ- 9 Score  0    Vision:Within last year and Dental: No current dental problems and Receives regular dental care    Patient Care Team: Samuel Bouche, NP as PCP - General (Nurse Practitioner)   Outpatient Medications Prior to Visit  Medication Sig   AMBULATORY NON FORMULARY MEDICATION Continuous positive airway pressure (CPAP) machine set at Autopap (8-36m of water pressure), with all supplemental supplies as needed.   blood glucose meter kit and supplies KIT Dispense based on patient and insurance preference. Use up to four times daily as directed. Please include lancets, test strips, control solution.   celecoxib (CELEBREX) 200 MG capsule TAKE 1 TO 2 CAPSULES BY MOUTH DAILY AS NEEDED FOR PAIN   ipratropium (ATROVENT) 0.03 % nasal spray PLACE 2 SPRAYS INTO BOTH NOSTRILS TWO TIMES DAILY   levocetirizine (XYZAL) 5 MG tablet TAKE 1 TABLET BY MOUTH EVERY EVENING   [START ON  01/07/2022] lisdexamfetamine (VYVANSE) 60 MG capsule Take 1 capsule by mouth daily.   lisdexamfetamine (VYVANSE) 60 MG capsule Take 1 capsule by mouth every morning. (09/09/21)   lisdexamfetamine (VYVANSE) 60 MG capsule Take 1 capsule (60 mg total) by mouth every morning. (10/09/21)   losartan (COZAAR) 50 MG tablet Take 1 tablet (50 mg total) by mouth daily.   MULTIPLE VITAMIN PO Take 1 tablet by mouth daily.   Naltrexone POWD 1.5 mg by mouth at night for 14 days then increasing to 3 mg by mouth at night. Dispense quantity sufficient for 90 days   ondansetron (ZOFRAN-ODT) 8 MG disintegrating tablet Dissolve 1 tablet by mouth every 8 hours as needed for nausea.   tirzepatide ([facility] 5 MG/0.5ML Pen Inject 5 mg into the skin once a week.   tiZANidine (ZANAFLEX) 4 MG tablet Take 1 tablet (4 mg total) by mouth every 8 (eight) hours as needed for muscle spasms.   [DISCONTINUED] aspirin EC 81 MG tablet Take 81 mg by mouth daily.   [DISCONTINUED] oxyCODONE-acetaminophen (PERCOCET/ROXICET) 5-325 MG tablet Take 1 tablet by mouth every 8 (eight) hours as needed for severe pain. (Patient not taking: Reported on 12/14/2021)   [DISCONTINUED] predniSONE (DELTASONE) 50 MG tablet Take 1 tablet (50 mg total) by mouth daily.   No facility-administered medications prior to visit.   Review of Systems  Constitutional:  Positive  for malaise/fatigue. Negative for chills and fever.  Respiratory:  Negative for cough, shortness of breath and wheezing.   Cardiovascular:  Negative for chest pain, palpitations and leg swelling.  Gastrointestinal:  Positive for abdominal pain. Negative for constipation, diarrhea, heartburn, nausea and vomiting.  Genitourinary:  Negative for dysuria, frequency, hematuria and urgency.  Musculoskeletal:  Positive for back pain, joint pain, myalgias and neck pain. Negative for falls.  Neurological:  Negative for dizziness, tingling and headaches.  Psychiatric/Behavioral:  Positive for  depression. Negative for suicidal ideas. The patient is nervous/anxious and has insomnia.      Objective:    BP 124/76   Pulse 72   Ht _0  (1.702 m)   Wt 214 lb (97.1 kg)   SpO2 99%   BMI 33.52 kg/m    Physical Exam Constitutional:      General: She is not in acute distress.    Appearance: Normal appearance. She is obese. She is not ill-appearing.  HENT:     Head: Normocephalic and atraumatic.     Right Ear: Tympanic membrane normal.     Left Ear: Tympanic membrane normal.     Nose: Nose normal.     Mouth/Throat:     Mouth: Mucous membranes are moist.     Pharynx: No oropharyngeal exudate or posterior oropharyngeal erythema.  Eyes:     Extraocular Movements: Extraocular movements intact.     Conjunctiva/sclera: Conjunctivae normal.     Pupils: Pupils are equal, round, and reactive to light.  Neck:     Thyroid: No thyromegaly.     Vascular: No carotid bruit or JVD.     Trachea: Trachea normal.  Cardiovascular:     Rate and Rhythm: Normal rate and regular rhythm.     Pulses: Normal pulses.     Heart sounds: Normal heart sounds. No murmur heard.    No friction rub. No gallop.  Pulmonary:     Effort: Pulmonary effort is normal. No respiratory distress.     Breath sounds: Normal breath sounds. No wheezing.  Abdominal:     General: Bowel sounds are normal. There is no distension.     Palpations: Abdomen is soft.     Tenderness: There is no abdominal tenderness. There is no guarding.  Musculoskeletal:        General: Normal range of motion.     Cervical back: Normal range of motion and neck supple.  Skin:    General: Skin is warm and dry.  Neurological:     Mental Status: She is alert and oriented to person, place, and time.     Cranial Nerves: No cranial nerve deficit.  Psychiatric:        Mood and Affect: Mood normal.        Behavior: Behavior normal.        Thought Content: Thought content normal.        Judgment: Judgment normal.      No results found for  any visits on 12/17/21.     Assessment & Plan:    Routine Health Maintenance and Physical Exam  Immunization History  Administered Date(s) Administered   DTaP 06/25/2010   Influenza, Seasonal, Injecte, Preservative Fre 04/13/2016   Influenza,inj,Quad PF,6+ Mos 04/23/2018, 03/14/2019, 04/10/2020   Influenza-Unspecified 03/26/2021   PFIZER(Purple Top)SARS-COV-2 Vaccination 07/06/2019, 07/24/2019, 04/03/2020   PPD Test 08/21/2009, 04/03/2017, 05/01/2017   Pneumococcal Polysaccharide-23 07/27/2020   Tdap 11/26/2015   Zoster Recombinat (Shingrix) 06/26/2019, 06/24/2020    Health Maintenance  Topic Date  Due   COVID-19 Vaccine (4 - Pfizer series) 05/29/2020   Hepatitis C Screening  04/11/2023 (Originally 06/03/1984)   INFLUENZA VACCINE  02/01/2022   HEMOGLOBIN A1C  02/07/2022   OPHTHALMOLOGY EXAM  06/03/2022   FOOT EXAM  08/10/2022   MAMMOGRAM  10/22/2023   TETANUS/TDAP  11/25/2025   COLONOSCOPY (Pts 45-56yr Insurance coverage will need to be confirmed)  07/01/2030   HIV Screening  Completed   Zoster Vaccines- Shingrix  Completed   HPV VACCINES  Aged Out    Discussed health benefits of physical activity, and encouraged her to engage in regular exercise appropriate for her age and condition.  1. Annual physical exam Checking labs as below.  Up-to-date on preventative care.  Wellness information provided with AVS. - CBC with Differential/Platelet - COMPLETE METABOLIC PANEL WITH GFR - Lipid panel  2. Rash Referring to dermatology per patient request. - Ambulatory referral to Dermatology  3. Polyarthralgia Referring to rheumatology per patient request. - Ambulatory referral to Rheumatology  Return in about 3 months (around 03/19/2022) for DM follow up or sooner if needed.   JSamuel Bouche NP

## 2021-12-18 LAB — COMPLETE METABOLIC PANEL WITH GFR
AG Ratio: 1.7 (calc) (ref 1.0–2.5)
ALT: 24 U/L (ref 6–29)
AST: 16 U/L (ref 10–35)
Albumin: 4.3 g/dL (ref 3.6–5.1)
Alkaline phosphatase (APISO): 77 U/L (ref 37–153)
BUN: 14 mg/dL (ref 7–25)
CO2: 27 mmol/L (ref 20–32)
Calcium: 9.1 mg/dL (ref 8.6–10.4)
Chloride: 105 mmol/L (ref 98–110)
Creat: 0.68 mg/dL (ref 0.50–1.03)
Globulin: 2.6 g/dL (calc) (ref 1.9–3.7)
Glucose, Bld: 101 mg/dL — ABNORMAL HIGH (ref 65–99)
Potassium: 4.2 mmol/L (ref 3.5–5.3)
Sodium: 139 mmol/L (ref 135–146)
Total Bilirubin: 0.7 mg/dL (ref 0.2–1.2)
Total Protein: 6.9 g/dL (ref 6.1–8.1)
eGFR: 103 mL/min/{1.73_m2} (ref >=60)

## 2021-12-18 LAB — CBC WITH DIFFERENTIAL/PLATELET
Absolute Monocytes: 693 cells/uL (ref 200–950)
Basophils Absolute: 79 cells/uL (ref 0–200)
Basophils Relative: 1.2 %
Eosinophils Absolute: 92 cells/uL (ref 15–500)
Eosinophils Relative: 1.4 %
HCT: 41.9 % (ref 35.0–45.0)
Hemoglobin: 14.8 g/dL (ref 11.7–15.5)
Lymphs Abs: 2099 cells/uL (ref 850–3900)
MCH: 31.8 pg (ref 27.0–33.0)
MCHC: 35.3 g/dL (ref 32.0–36.0)
MCV: 90.1 fL (ref 80.0–100.0)
MPV: 11.1 fL (ref 7.5–12.5)
Monocytes Relative: 10.5 %
Neutro Abs: 3637 cells/uL (ref 1500–7800)
Neutrophils Relative %: 55.1 %
Platelets: 363 10*3/uL (ref 140–400)
RBC: 4.65 10*6/uL (ref 3.80–5.10)
RDW: 12.2 % (ref 11.0–15.0)
Total Lymphocyte: 31.8 %
WBC: 6.6 10*3/uL (ref 3.8–10.8)

## 2021-12-18 LAB — LIPID PANEL
Cholesterol: 246 mg/dL — ABNORMAL HIGH (ref <200)
HDL: 63 mg/dL (ref >=50)
LDL Cholesterol (Calc): 158 mg/dL (calc) — ABNORMAL HIGH
Non-HDL Cholesterol (Calc): 183 mg/dL (calc) — ABNORMAL HIGH (ref <130)
Total CHOL/HDL Ratio: 3.9 (calc) (ref <5.0)
Triglycerides: 123 mg/dL (ref <150)

## 2021-12-22 ENCOUNTER — Ambulatory Visit (INDEPENDENT_AMBULATORY_CARE_PROVIDER_SITE_OTHER): Payer: No Typology Code available for payment source | Admitting: Orthopaedic Surgery

## 2021-12-22 ENCOUNTER — Ambulatory Visit (INDEPENDENT_AMBULATORY_CARE_PROVIDER_SITE_OTHER): Payer: No Typology Code available for payment source

## 2021-12-22 DIAGNOSIS — M6520 Calcific tendinitis, unspecified site: Secondary | ICD-10-CM

## 2021-12-22 DIAGNOSIS — G8929 Other chronic pain: Secondary | ICD-10-CM

## 2021-12-22 DIAGNOSIS — M25511 Pain in right shoulder: Secondary | ICD-10-CM | POA: Diagnosis not present

## 2021-12-22 NOTE — Progress Notes (Signed)
Chief Complaint: Right shoulder pain     History of Present Illness:    Melanie [name] is a 56 y.o. female presents today for follow-up of her right shoulder.  She has previously been seeing [name] who has been managing her for some lateral based shoulder pain.  She has previously had subacromial injections with limited relief.  She experiences a popping and catching with overhead motion.  She denies any frank weakness.  She is experiencing pain that radiates down the side and she is not able to sleep on the side.  She is currently being worked up for a cervical disc herniation for which she is seeing Dr. Lynann Bologna.  She is here today for further assessment.  She has trialed physical therapy in the past.  This was with limited relief.    Surgical History:   None  PMH/PSH/Family History/Social History/Meds/Allergies:    Past Medical History:  Diagnosis Date   Arthritis    Bowel obstruction (HCC)    Constipation 07/01/2020   Diabetes mellitus without complication ([place])    Diverticulosis    History of prediabetes 02/27/2018   Hypertension    Post-operative nausea and vomiting    Sleep apnea    uses CPAP   Past Surgical History:  Procedure Laterality Date   ABDOMINAL HYSTERECTOMY     COLONOSCOPY  20 years ago    in [place]=diverticulosis    LAPAROSCOPY     due to bowel perforation   MYOMECTOMY     OOPHORECTOMY     unilateral   Social History   Socioeconomic History   Marital status: Divorced    Spouse name: Not on file   Number of children: Not on file   Years of education: Not on file   Highest education level: Not on file  Occupational History   Not on file  Tobacco Use   Smoking status: Never   Smokeless tobacco: Never  Vaping Use   Vaping Use: Never used  Substance and Sexual Activity   Alcohol use: Yes    Comment: Occassionally   Drug use: Never   Sexual activity: Not Currently    Birth control/protection:  Surgical  Other Topics Concern   Not on file  Social History Narrative   Not on file   Social Determinants of Health   Financial Resource Strain: Not on file  Food Insecurity: Not on file  Transportation Needs: Not on file  Physical Activity: Not on file  Stress: Not on file  Social Connections: Not on file   Family History  Problem Relation Age of Onset   Lung cancer Father    Diabetes Mother    Skin cancer Mother    Colon cancer Neg Hx    Colon polyps Neg Hx    Esophageal cancer Neg Hx    Rectal cancer Neg Hx    Stomach cancer Neg Hx    Allergies  Allergen Reactions   Morphine Other (See Comments)    mental status alerted   Sulfa Antibiotics Hives   Clarithromycin Rash and Other (See Comments)    Other Reaction: Fever   Current Outpatient Medications  Medication Sig Dispense Refill   AMBULATORY NON FORMULARY MEDICATION Continuous positive airway pressure (CPAP) machine set at [employer]ap (8-101m of water pressure), with all supplemental supplies as needed. 1 each  0   blood glucose meter kit and supplies KIT Dispense based on patient and insurance preference. Use up to four times daily as directed. Please include lancets, test strips, control solution. 1 each 0   celecoxib (CELEBREX) 200 MG capsule TAKE 1 TO 2 CAPSULES BY MOUTH DAILY AS NEEDED FOR PAIN 180 capsule 0   ipratropium (ATROVENT) 0.03 % nasal spray PLACE 2 SPRAYS INTO BOTH NOSTRILS TWO TIMES DAILY 30 mL 2   levocetirizine (XYZAL) 5 MG tablet TAKE 1 TABLET BY MOUTH EVERY EVENING 90 tablet 1   [START ON 01/07/2022] lisdexamfetamine (VYVANSE) 60 MG capsule Take 1 capsule by mouth daily. 30 capsule 0   lisdexamfetamine (VYVANSE) 60 MG capsule Take 1 capsule by mouth every morning. (09/09/21) 30 capsule 0   lisdexamfetamine (VYVANSE) 60 MG capsule Take 1 capsule (60 mg total) by mouth every morning. (10/09/21) 30 capsule 0   losartan (COZAAR) 50 MG tablet Take 1 tablet (50 mg total) by mouth daily. 90 tablet 1   MULTIPLE  VITAMIN PO Take 1 tablet by mouth daily.     Naltrexone POWD 1.5 mg by mouth at night for 14 days then increasing to 3 mg by mouth at night. Dispense quantity sufficient for 90 days 250 g 3   ondansetron (ZOFRAN-ODT) 8 MG disintegrating tablet Dissolve 1 tablet by mouth every 8 hours as needed for nausea. 20 tablet 3   tirzepatide (MOUNJARO) 5 MG/0.5ML Pen Inject 5 mg into the skin once a week. 6 mL 0   tiZANidine (ZANAFLEX) 4 MG tablet Take 1 tablet (4 mg total) by mouth every 8 (eight) hours as needed for muscle spasms. 60 tablet 1   No current facility-administered medications for this visit.   No results found.  Review of Systems:   A ROS was performed including pertinent positives and negatives as documented in the HPI.  Physical Exam :   Constitutional: NAD and appears stated age Neurological: Alert and oriented Psych: Appropriate affect and cooperative There were no vitals taken for this visit.   Comprehensive Musculoskeletal Exam:    Tenderness palpation about the greater tuberosity as well as lateral deltoid.  There is full forward elevation to 170 degrees equally.  External rotation at side is to 70 degrees equally internal rotation of the T12 on the left and L1 on the right.  She is got full strength with forward elevation.  She negative belly press  Imaging:   Xray (3 views right shoulder): There is a large interval formation of calcific tendinitis  MRI (right shoulder): There is a large deposit of calcific tendinitis with some degenerative findings in the rotator cuff but overall intact.  I personally reviewed and interpreted the radiographs.   Assessment:   56 y.o. female right-hand-dominant with right shoulder pain consistent with ongoing rotator cuff tendinitis.  I did describe that initially treatment algorithm is to start with an ultrasound-guided injection of the specific calcium deposit.  I did describe that she does not resolve her symptoms surgery would be an  option to go ahead and debride this.  I did describe that in a small portion of patients this is associated with an underlying rotator cuff tear that will need to be repaired following removal.  I do believe that she in conjunction with a rotator cuff strengthening program will have a good likelihood of improving her shoulder pain.  I will plan to see her back in 4 weeks for reassessment should this not make her feel better.  Plan :    -  Return to clinic in 4 weeks     I personally saw and evaluated the patient, and participated in the management and treatment plan.  Vanetta Mulders, MD Attending Physician, Orthopedic Surgery  This document was dictated using Dragon voice recognition software. A reasonable attempt at proof reading has been made to minimize errors.

## 2021-12-23 ENCOUNTER — Encounter (HOSPITAL_BASED_OUTPATIENT_CLINIC_OR_DEPARTMENT_OTHER): Payer: Self-pay | Admitting: Orthopaedic Surgery

## 2021-12-23 ENCOUNTER — Other Ambulatory Visit (HOSPITAL_BASED_OUTPATIENT_CLINIC_OR_DEPARTMENT_OTHER): Payer: Self-pay | Admitting: Orthopaedic Surgery

## 2021-12-23 ENCOUNTER — Inpatient Hospital Stay
Admission: RE | Admit: 2021-12-23 | Discharge: 2021-12-23 | Disposition: A | Discharge status: Home / Self Care | Payer: No Typology Code available for payment source | Source: Ambulatory Visit | Attending: Family Medicine | Admitting: Family Medicine

## 2021-12-23 DIAGNOSIS — G5602 Carpal tunnel syndrome, left upper limb: Secondary | ICD-10-CM

## 2021-12-23 DIAGNOSIS — M6520 Calcific tendinitis, unspecified site: Secondary | ICD-10-CM

## 2021-12-23 DIAGNOSIS — G8929 Other chronic pain: Secondary | ICD-10-CM

## 2021-12-23 NOTE — Discharge Instructions (Signed)

## 2021-12-24 DIAGNOSIS — M7531 Calcific tendinitis of right shoulder: Secondary | ICD-10-CM | POA: Insufficient documentation

## 2021-12-28 ENCOUNTER — Other Ambulatory Visit: Payer: Self-pay | Admitting: Orthopedic Surgery

## 2021-12-29 ENCOUNTER — Other Ambulatory Visit (HOSPITAL_BASED_OUTPATIENT_CLINIC_OR_DEPARTMENT_OTHER): Payer: Self-pay | Admitting: Orthopaedic Surgery

## 2021-12-29 ENCOUNTER — Ambulatory Visit (INDEPENDENT_AMBULATORY_CARE_PROVIDER_SITE_OTHER): Payer: No Typology Code available for payment source

## 2021-12-29 ENCOUNTER — Ambulatory Visit (HOSPITAL_BASED_OUTPATIENT_CLINIC_OR_DEPARTMENT_OTHER): Payer: No Typology Code available for payment source | Admitting: Orthopaedic Surgery

## 2021-12-29 DIAGNOSIS — G8929 Other chronic pain: Secondary | ICD-10-CM

## 2021-12-29 DIAGNOSIS — M25561 Pain in right knee: Secondary | ICD-10-CM

## 2021-12-31 ENCOUNTER — Encounter: Payer: Self-pay | Admitting: Family Medicine

## 2021-12-31 ENCOUNTER — Other Ambulatory Visit (HOSPITAL_BASED_OUTPATIENT_CLINIC_OR_DEPARTMENT_OTHER): Payer: Self-pay

## 2021-12-31 ENCOUNTER — Ambulatory Visit (INDEPENDENT_AMBULATORY_CARE_PROVIDER_SITE_OTHER): Payer: No Typology Code available for payment source | Admitting: Orthopaedic Surgery

## 2021-12-31 DIAGNOSIS — M6751 Plica syndrome, right knee: Secondary | ICD-10-CM | POA: Diagnosis not present

## 2021-12-31 MED ORDER — OXYCODONE HCL 5 MG PO TABS
5.0000 mg | ORAL_TABLET | ORAL | 0 refills | Status: DC | PRN
  Filled 2021-12-31: qty 20, 4d supply, fill #0

## 2021-12-31 MED ORDER — ACETAMINOPHEN 500 MG PO TABS
500.0000 mg | ORAL_TABLET | Freq: Three times a day (TID) | ORAL | 0 refills | Status: AC
  Filled 2021-12-31: qty 30, 10d supply, fill #0

## 2021-12-31 MED ORDER — ASPIRIN 325 MG PO TBEC
325.0000 mg | DELAYED_RELEASE_TABLET | Freq: Every day | ORAL | 0 refills | Status: DC
  Filled 2021-12-31: qty 30, 30d supply, fill #0

## 2021-12-31 NOTE — H&P (View-Only) (Signed)
Chief Complaint: Right shoulder pain, right knee pain     History of Present Illness:   12/31/2021: Presents today for follow-up of her right shoulder as well as right knee.  She states that the right shoulder overall feels dramatically better after steroid injection.  She is overall very pleased with the results of this.  She is status today talking more about the right knee.  She states that overall she has been experiencing pain for really the last 10 years and did not have any specific injury.  She has had a number of steroid injections including approximately 5 total intra-articularly all of which provide relief for a period of weeks although this tenderness tender resolved.  She states that she experiences periodic swelling of the knee which is significantly painful for her.  She has had a strengthening program in the past for the knee which did not help significantly.  She does feel like it will occasionally give out.  She experiences the pain in the medial aspect of the knee and somewhat of a C-shaped distribution.  She is here today for further assessment and treatment.  She was recently seen by Dr. Lynann Bologna who did recommend cervical discectomy and fusion which she has not pursued at this time.  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  Melanie [name] is a 56 y.o. female presents today for follow-up of her right shoulder.  She has previously been seeing [name] who has been managing her for some lateral based shoulder pain.  She has previously had subacromial injections with limited relief.  She experiences a popping and catching with overhead motion.  She denies any frank weakness.  She is experiencing pain that radiates down the side and she is not able to sleep on the side.  She is currently being worked up for a cervical disc herniation for which she is seeing Dr. Lynann Bologna.  She is here today for further assessment.  She has trialed physical  therapy in the past.  This was with limited relief.    Surgical History:   None  PMH/PSH/Family History/Social History/Meds/Allergies:    Past Medical History:  Diagnosis Date   Arthritis    Bowel obstruction (HCC)    Constipation 07/01/2020   Diabetes mellitus without complication ([place])    Diverticulosis    History of prediabetes 02/27/2018   Hypertension    Post-operative nausea and vomiting    Sleep apnea    uses CPAP   Past Surgical History:  Procedure Laterality Date   ABDOMINAL HYSTERECTOMY     COLONOSCOPY  20 years ago    in [place]=diverticulosis    LAPAROSCOPY     due to bowel perforation   MYOMECTOMY     OOPHORECTOMY     unilateral   Social History   Socioeconomic History   Marital status: Divorced    Spouse name: Not on file   Number of children: Not on file   Years of education: Not on file   Highest education level: Not on file  Occupational History   Not on file  Tobacco Use   Smoking status: Never   Smokeless tobacco: Never  Vaping Use   Vaping Use: Never used  Substance and Sexual Activity   Alcohol use: Yes    Comment: Occassionally  Drug use: Never   Sexual activity: Not Currently    Birth control/protection: Surgical  Other Topics Concern   Not on file  Social History Narrative   Not on file   Social Determinants of Health   Financial Resource Strain: Not on file  Food Insecurity: Not on file  Transportation Needs: Not on file  Physical Activity: Not on file  Stress: Not on file  Social Connections: Not on file   Family History  Problem Relation Age of Onset   Lung cancer Father    Diabetes Mother    Skin cancer Mother    Colon cancer Neg Hx    Colon polyps Neg Hx    Esophageal cancer Neg Hx    Rectal cancer Neg Hx    Stomach cancer Neg Hx    Allergies  Allergen Reactions   Morphine Other (See Comments)    mental status alerted   Sulfa Antibiotics Hives   Clarithromycin Rash and Other (See Comments)    Other  Reaction: Fever   Current Outpatient Medications  Medication Sig Dispense Refill   acetaminophen (TYLENOL) 500 MG tablet Take 1 tablet (500 mg total) by mouth every 8 (eight) hours for 10 days. 30 tablet 0   aspirin EC 325 MG tablet Take 1 tablet (325 mg total) by mouth daily. 30 tablet 0   oxyCODONE (OXY IR/ROXICODONE) 5 MG immediate release tablet Take 1 tablet (5 mg total) by mouth every 4 (four) hours as needed (severe pain). 20 tablet 0   AMBULATORY NON FORMULARY MEDICATION Continuous positive airway pressure (CPAP) machine set at [employer] (8-31m of water pressure), with all supplemental supplies as needed. 1 each 0   blood glucose meter kit and supplies KIT Dispense based on patient and insurance preference. Use up to four times daily as directed. Please include lancets, test strips, control solution. 1 each 0   celecoxib (CELEBREX) 200 MG capsule TAKE 1 TO 2 CAPSULES BY MOUTH DAILY AS NEEDED FOR PAIN 180 capsule 0   ipratropium (ATROVENT) 0.03 % nasal spray PLACE 2 SPRAYS INTO BOTH NOSTRILS TWO TIMES DAILY 30 mL 2   levocetirizine (XYZAL) 5 MG tablet TAKE 1 TABLET BY MOUTH EVERY EVENING 90 tablet 1   [START ON 01/07/2022] lisdexamfetamine (VYVANSE) 60 MG capsule Take 1 capsule by mouth daily. 30 capsule 0   lisdexamfetamine (VYVANSE) 60 MG capsule Take 1 capsule by mouth every morning. (09/09/21) 30 capsule 0   lisdexamfetamine (VYVANSE) 60 MG capsule Take 1 capsule (60 mg total) by mouth every morning. (10/09/21) 30 capsule 0   losartan (COZAAR) 50 MG tablet Take 1 tablet (50 mg total) by mouth daily. 90 tablet 1   MULTIPLE VITAMIN PO Take 1 tablet by mouth daily.     Naltrexone POWD 1.5 mg by mouth at night for 14 days then increasing to 3 mg by mouth at night. Dispense quantity sufficient for 90 days 250 g 3   ondansetron (ZOFRAN-ODT) 8 MG disintegrating tablet Dissolve 1 tablet by mouth every 8 hours as needed for nausea. 20 tablet 3   tirzepatide (MOUNJARO) 5 MG/0.5ML Pen Inject 5 mg  into the skin once a week. 6 mL 0   tiZANidine (ZANAFLEX) 4 MG tablet Take 1 tablet (4 mg total) by mouth every 8 (eight) hours as needed for muscle spasms. 60 tablet 1   No current facility-administered medications for this visit.   DG Knee Complete 4 Views Right  Result Date: 12/30/2021 CLINICAL DATA:  Chronic knee pain EXAM: RIGHT  KNEE - COMPLETE 4+ VIEW COMPARISON:  03/18/2019 FINDINGS: No fracture or malalignment. Mild medial and patellofemoral degenerative changes. Positive for knee effusion. IMPRESSION: Mild degenerative changes without acute osseous abnormality. Knee effusion Electronically Signed   By: Donavan Foil [name]   On: 12/30/2021 22:26    Review of Systems:   A ROS was performed including pertinent positives and negatives as documented in the HPI.  Physical Exam :   Constitutional: NAD and appears stated age Neurological: Alert and oriented Psych: Appropriate affect and cooperative There were no vitals taken for this visit.   Comprehensive Musculoskeletal Exam:       Musculoskeletal Exam  Gait Normal  Alignment Normal   Right Left  Inspection Normal Normal  Palpation    Tenderness Medial femoral condyle and joint None  Crepitus None None  Effusion Mild None  Range of Motion    Extension -3 -3  Flexion 130 130  Strength    Extension 5/5 5/5  Flexion 5/5 5/5  Ligament Exam     Generalized Laxity No No  Lachman Negative Negative   Pivot Shift Negative Negative  Anterior Drawer Negative Negative  Valgus at 0 Negative Negative  Valgus at 20 Negative Negative  Varus at 0 0 0  Varus at 20   0 0  Posterior Drawer at 90 0 0  Vascular/Lymphatic Exam    Edema None None  Venous Stasis Changes No No  Distal Circulation Normal Normal  Neurologic    Light Touch Sensation Intact Intact  Special Tests:      Imaging:   Xray (right knee 4 views): Normal  MRI (right knee): Overall cartilage spaces are well-maintained.  There is no evidence of any meniscal  tear.  The anterior as well as posterior cruciate ligaments as well as collateral ligaments are intact.  She does have a large hypertrophic medial plica with evidence of hyperintensity signal around the medial condyle.  I personally reviewed and interpreted the radiographs.   Assessment:   56 y.o. female overall doing better with regard to her right shoulder rotator cuff tendinitis.  Today her exam and history are consistent with right knee medial plica syndrome.  I did discuss continued treatment options with her.  She has had a knee program as well as 5 injections in the knee at this time.  Given the fact that she has trialed such extensive nonoperative management, we did discuss the role of possible plica excision.  I did discuss that the alternative this would be a period of supervised physical therapy.  That being said given the fact that she has continued to experience pain and this is somewhat more of an impingement phenomenon I do believe structured physical therapy would be less likely to completely relieve her pain.  To that effect we did discuss arthroscopic excision of the plica.  After discussion of the risks and benefits she is ultimately elected for right knee arthroscopic plica excision.  Plan :    -Plan for right knee arthroscopic plica excision    I personally saw and evaluated the patient, and participated in the management and treatment plan.  Vanetta Mulders, MD Attending Physician, Orthopedic Surgery  This document was dictated using Dragon voice recognition software. A reasonable attempt at proof reading has been made to minimize errors.

## 2021-12-31 NOTE — Progress Notes (Signed)
Chief Complaint: Right shoulder pain, right knee pain     History of Present Illness:   12/31/2021: Presents today for follow-up of her right shoulder as well as right knee.  She states that the right shoulder overall feels dramatically better after steroid injection.  She is overall very pleased with the results of this.  She is status today talking more about the right knee.  She states that overall she has been experiencing pain for really the last 10 years and did not have any specific injury.  She has had a number of steroid injections including approximately 5 total intra-articularly all of which provide relief for a period of weeks although this tenderness tender resolved.  She states that she experiences periodic swelling of the knee which is significantly painful for her.  She has had a strengthening program in the past for the knee which did not help significantly.  She does feel like it will occasionally give out.  She experiences the pain in the medial aspect of the knee and somewhat of a C-shaped distribution.  She is here today for further assessment and treatment.  She was recently seen by Dr. Lynann Bologna who did recommend cervical discectomy and fusion which she has not pursued at this time.  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  Melanie [name] is a 56 y.o. female presents today for follow-up of her right shoulder.  She has previously been seeing [name] who has been managing her for some lateral based shoulder pain.  She has previously had subacromial injections with limited relief.  She experiences a popping and catching with overhead motion.  She denies any frank weakness.  She is experiencing pain that radiates down the side and she is not able to sleep on the side.  She is currently being worked up for a cervical disc herniation for which she is seeing Dr. Lynann Bologna.  She is here today for further assessment.  She has trialed physical  therapy in the past.  This was with limited relief.    Surgical History:   None  PMH/PSH/Family History/Social History/Meds/Allergies:    Past Medical History:  Diagnosis Date   Arthritis    Bowel obstruction (HCC)    Constipation 07/01/2020   Diabetes mellitus without complication ([place])    Diverticulosis    History of prediabetes 02/27/2018   Hypertension    Post-operative nausea and vomiting    Sleep apnea    uses CPAP   Past Surgical History:  Procedure Laterality Date   ABDOMINAL HYSTERECTOMY     COLONOSCOPY  20 years ago    in [place]=diverticulosis    LAPAROSCOPY     due to bowel perforation   MYOMECTOMY     OOPHORECTOMY     unilateral   Social History   Socioeconomic History   Marital status: Divorced    Spouse name: Not on file   Number of children: Not on file   Years of education: Not on file   Highest education level: Not on file  Occupational History   Not on file  Tobacco Use   Smoking status: Never   Smokeless tobacco: Never  Vaping Use   Vaping Use: Never used  Substance and Sexual Activity   Alcohol use: Yes    Comment: Occassionally  Drug use: Never   Sexual activity: Not Currently    Birth control/protection: Surgical  Other Topics Concern   Not on file  Social History Narrative   Not on file   Social Determinants of Health   Financial Resource Strain: Not on file  Food Insecurity: Not on file  Transportation Needs: Not on file  Physical Activity: Not on file  Stress: Not on file  Social Connections: Not on file   Family History  Problem Relation Age of Onset   Lung cancer Father    Diabetes Mother    Skin cancer Mother    Colon cancer Neg Hx    Colon polyps Neg Hx    Esophageal cancer Neg Hx    Rectal cancer Neg Hx    Stomach cancer Neg Hx    Allergies  Allergen Reactions   Morphine Other (See Comments)    mental status alerted   Sulfa Antibiotics Hives   Clarithromycin Rash and Other (See Comments)    Other  Reaction: Fever   Current Outpatient Medications  Medication Sig Dispense Refill   acetaminophen (TYLENOL) 500 MG tablet Take 1 tablet (500 mg total) by mouth every 8 (eight) hours for 10 days. 30 tablet 0   aspirin EC 325 MG tablet Take 1 tablet (325 mg total) by mouth daily. 30 tablet 0   oxyCODONE (OXY IR/ROXICODONE) 5 MG immediate release tablet Take 1 tablet (5 mg total) by mouth every 4 (four) hours as needed (severe pain). 20 tablet 0   AMBULATORY NON FORMULARY MEDICATION Continuous positive airway pressure (CPAP) machine set at [employer] (8-15m of water pressure), with all supplemental supplies as needed. 1 each 0   blood glucose meter kit and supplies KIT Dispense based on patient and insurance preference. Use up to four times daily as directed. Please include lancets, test strips, control solution. 1 each 0   celecoxib (CELEBREX) 200 MG capsule TAKE 1 TO 2 CAPSULES BY MOUTH DAILY AS NEEDED FOR PAIN 180 capsule 0   ipratropium (ATROVENT) 0.03 % nasal spray PLACE 2 SPRAYS INTO BOTH NOSTRILS TWO TIMES DAILY 30 mL 2   levocetirizine (XYZAL) 5 MG tablet TAKE 1 TABLET BY MOUTH EVERY EVENING 90 tablet 1   [START ON 01/07/2022] lisdexamfetamine (VYVANSE) 60 MG capsule Take 1 capsule by mouth daily. 30 capsule 0   lisdexamfetamine (VYVANSE) 60 MG capsule Take 1 capsule by mouth every morning. (09/09/21) 30 capsule 0   lisdexamfetamine (VYVANSE) 60 MG capsule Take 1 capsule (60 mg total) by mouth every morning. (10/09/21) 30 capsule 0   losartan (COZAAR) 50 MG tablet Take 1 tablet (50 mg total) by mouth daily. 90 tablet 1   MULTIPLE VITAMIN PO Take 1 tablet by mouth daily.     Naltrexone POWD 1.5 mg by mouth at night for 14 days then increasing to 3 mg by mouth at night. Dispense quantity sufficient for 90 days 250 g 3   ondansetron (ZOFRAN-ODT) 8 MG disintegrating tablet Dissolve 1 tablet by mouth every 8 hours as needed for nausea. 20 tablet 3   tirzepatide (MOUNJARO) 5 MG/0.5ML Pen Inject 5 mg  into the skin once a week. 6 mL 0   tiZANidine (ZANAFLEX) 4 MG tablet Take 1 tablet (4 mg total) by mouth every 8 (eight) hours as needed for muscle spasms. 60 tablet 1   [name] current facility-administered medications for this visit.   DG Knee Complete 4 Views Right  Result Date: 12/30/2021 CLINICAL DATA:  Chronic knee pain EXAM: RIGHT  KNEE - COMPLETE 4+ VIEW COMPARISON:  03/18/2019 FINDINGS: [name] fracture or malalignment. Mild medial and patellofemoral degenerative changes. Positive for knee effusion. IMPRESSION: Mild degenerative changes without acute osseous abnormality. Knee effusion Electronically Signed   By: Donavan Foil [name]   On: 12/30/2021 22:26    Review of Systems:   A ROS was performed including pertinent positives and negatives as documented in the HPI.  Physical Exam :   Constitutional: NAD and appears stated age Neurological: Alert and oriented Psych: Appropriate affect and cooperative There were [name] vitals taken for this visit.   Comprehensive Musculoskeletal Exam:       Musculoskeletal Exam  Gait Normal  Alignment Normal   Right Left  Inspection Normal Normal  Palpation    Tenderness Medial femoral condyle and joint None  Crepitus None None  Effusion Mild None  Range of Motion    Extension -3 -3  Flexion 130 130  Strength    Extension 5/5 5/5  Flexion 5/5 5/5  Ligament Exam     Generalized Laxity [name] [name]  Lachman Negative Negative   Pivot Shift Negative Negative  Anterior Drawer Negative Negative  Valgus at 0 Negative Negative  Valgus at 20 Negative Negative  Varus at 0 0 0  Varus at 20   0 0  Posterior Drawer at 90 0 0  Vascular/Lymphatic Exam    Edema None None  Venous Stasis Changes [name] [name]  Distal Circulation Normal Normal  Neurologic    Light Touch Sensation Intact Intact  Special Tests:      Imaging:   Xray (right knee 4 views): Normal  MRI (right knee): Overall cartilage spaces are well-maintained.  There is [name] evidence of any meniscal  tear.  The anterior as well as posterior cruciate ligaments as well as collateral ligaments are intact.  She does have a large hypertrophic medial plica with evidence of hyperintensity signal around the medial condyle.  I personally reviewed and interpreted the radiographs.   Assessment:   56 y.o. female overall doing better with regard to her right shoulder rotator cuff tendinitis.  Today her exam and history are consistent with right knee medial plica syndrome.  I did discuss continued treatment options with her.  She has had a knee program as well as 5 injections in the knee at this time.  Given the fact that she has trialed such extensive nonoperative management, we did discuss the role of possible plica excision.  I did discuss that the alternative this would be a period of supervised physical therapy.  That being said given the fact that she has continued to experience pain and this is somewhat more of an impingement phenomenon I do believe structured physical therapy would be less likely to completely relieve her pain.  To that effect we did discuss arthroscopic excision of the plica.  After discussion of the risks and benefits she is ultimately elected for right knee arthroscopic plica excision.  Plan :    -Plan for right knee arthroscopic plica excision    I personally saw and evaluated the patient, and participated in the management and treatment plan.  Vanetta Mulders, MD Attending Physician, Orthopedic Surgery  This document was dictated using Dragon voice recognition software. A reasonable attempt at proof reading has been made to minimize errors.

## 2022-01-05 ENCOUNTER — Encounter (HOSPITAL_BASED_OUTPATIENT_CLINIC_OR_DEPARTMENT_OTHER): Payer: Self-pay | Admitting: Orthopaedic Surgery

## 2022-01-05 ENCOUNTER — Inpatient Hospital Stay
Admission: RE | Admit: 2022-01-05 | Discharge: 2022-01-05 | Disposition: A | Discharge status: Home / Self Care | Payer: No Typology Code available for payment source | Source: Ambulatory Visit | Attending: Family Medicine | Admitting: Family Medicine

## 2022-01-05 NOTE — Discharge Instructions (Signed)

## 2022-01-11 ENCOUNTER — Telehealth: Payer: Self-pay | Admitting: Orthopedic Surgery

## 2022-01-11 ENCOUNTER — Ambulatory Visit: Payer: No Typology Code available for payment source | Admitting: Orthopedic Surgery

## 2022-01-11 NOTE — Telephone Encounter (Signed)
Received call from patient checking if we received records from [facility]. I advised pt that we do no have. I did advise patient of Dr. Tempie Donning leaving the practice and not taking new patients, so r/s isn't an option. She was frustrated but expressed understanding.

## 2022-01-12 ENCOUNTER — Encounter (HOSPITAL_BASED_OUTPATIENT_CLINIC_OR_DEPARTMENT_OTHER): Payer: Self-pay | Admitting: Orthopaedic Surgery

## 2022-01-12 ENCOUNTER — Other Ambulatory Visit: Payer: Self-pay

## 2022-01-13 ENCOUNTER — Ambulatory Visit (HOSPITAL_BASED_OUTPATIENT_CLINIC_OR_DEPARTMENT_OTHER): Payer: Self-pay | Admitting: Orthopaedic Surgery

## 2022-01-13 DIAGNOSIS — M6751 Plica syndrome, right knee: Secondary | ICD-10-CM

## 2022-01-14 ENCOUNTER — Encounter (HOSPITAL_BASED_OUTPATIENT_CLINIC_OR_DEPARTMENT_OTHER)
Admission: RE | Admit: 2022-01-14 | Discharge: 2022-01-14 | Disposition: A | Discharge status: Home / Self Care | Payer: No Typology Code available for payment source | Source: Ambulatory Visit | Attending: Orthopaedic Surgery | Admitting: Orthopaedic Surgery

## 2022-01-14 DIAGNOSIS — Z01812 Encounter for preprocedural laboratory examination: Secondary | ICD-10-CM | POA: Insufficient documentation

## 2022-01-14 LAB — BASIC METABOLIC PANEL
Anion gap: 9 (ref 5–15)
BUN: 12 mg/dL (ref 6–20)
CO2: 24 mmol/L (ref 22–32)
Calcium: 9.2 mg/dL (ref 8.9–10.3)
Chloride: 105 mmol/L (ref 98–111)
Creatinine, Ser: 0.69 mg/dL (ref 0.44–1.00)
GFR, Estimated: >60 mL/min (ref >60)
Glucose, Bld: 119 mg/dL — ABNORMAL HIGH (ref 70–99)
Potassium: 3.7 mmol/L (ref 3.5–5.1)
Sodium: 138 mmol/L (ref 135–145)

## 2022-01-14 NOTE — Progress Notes (Signed)

## 2022-01-18 ENCOUNTER — Other Ambulatory Visit: Payer: Self-pay

## 2022-01-18 ENCOUNTER — Ambulatory Visit (HOSPITAL_BASED_OUTPATIENT_CLINIC_OR_DEPARTMENT_OTHER)
Admission: RE | Admit: 2022-01-18 | Discharge: 2022-01-18 | Disposition: A | Discharge status: Home / Self Care | Payer: No Typology Code available for payment source | Attending: Orthopaedic Surgery | Admitting: Orthopaedic Surgery

## 2022-01-18 ENCOUNTER — Encounter (HOSPITAL_BASED_OUTPATIENT_CLINIC_OR_DEPARTMENT_OTHER)
Admission: RE | Disposition: A | Discharge status: Home / Self Care | Payer: Self-pay | Source: Home / Self Care | Attending: Orthopaedic Surgery

## 2022-01-18 ENCOUNTER — Encounter (HOSPITAL_BASED_OUTPATIENT_CLINIC_OR_DEPARTMENT_OTHER): Payer: Self-pay | Admitting: Orthopaedic Surgery

## 2022-01-18 ENCOUNTER — Ambulatory Visit (HOSPITAL_BASED_OUTPATIENT_CLINIC_OR_DEPARTMENT_OTHER): Payer: No Typology Code available for payment source | Admitting: Anesthesiology

## 2022-01-18 DIAGNOSIS — M6751 Plica syndrome, right knee: Secondary | ICD-10-CM

## 2022-01-18 DIAGNOSIS — I10 Essential (primary) hypertension: Secondary | ICD-10-CM | POA: Insufficient documentation

## 2022-01-18 DIAGNOSIS — Z7985 Long-term (current) use of injectable non-insulin antidiabetic drugs: Secondary | ICD-10-CM

## 2022-01-18 DIAGNOSIS — E119 Type 2 diabetes mellitus without complications: Secondary | ICD-10-CM

## 2022-01-18 DIAGNOSIS — S83241A Other tear of medial meniscus, current injury, right knee, initial encounter: Secondary | ICD-10-CM

## 2022-01-18 DIAGNOSIS — X58XXXA Exposure to other specified factors, initial encounter: Secondary | ICD-10-CM | POA: Diagnosis not present

## 2022-01-18 DIAGNOSIS — G4730 Sleep apnea, unspecified: Secondary | ICD-10-CM | POA: Insufficient documentation

## 2022-01-18 DIAGNOSIS — M5020 Other cervical disc displacement, unspecified cervical region: Secondary | ICD-10-CM | POA: Insufficient documentation

## 2022-01-18 DIAGNOSIS — M797 Fibromyalgia: Secondary | ICD-10-CM | POA: Insufficient documentation

## 2022-01-18 HISTORY — PX: KNEE ARTHROSCOPY WITH EXCISION PLICA: SHX5647

## 2022-01-18 HISTORY — DX: Fibromyalgia: M79.7

## 2022-01-18 HISTORY — DX: Attention-deficit hyperactivity disorder, unspecified type: F90.9

## 2022-01-18 LAB — GLUCOSE, CAPILLARY
Glucose-Capillary: 86 mg/dL (ref 70–99)
Glucose-Capillary: 118 mg/dL — ABNORMAL HIGH (ref 70–99)

## 2022-01-18 SURGERY — ARTHROSCOPY, KNEE, WITH PLICA EXCISION
Anesthesia: General | Site: Knee | Laterality: Right

## 2022-01-18 MED ORDER — FENTANYL CITRATE (PF) 100 MCG/2ML IJ SOLN
INTRAMUSCULAR | Status: AC
  Filled 2022-01-18: qty 2

## 2022-01-18 MED ORDER — PROPOFOL 500 MG/50ML IV EMUL
INTRAVENOUS | Status: AC
  Filled 2022-01-18: qty 150

## 2022-01-18 MED ORDER — BUPIVACAINE-EPINEPHRINE (PF) 0.25% -1:200000 IJ SOLN
INTRAMUSCULAR | Status: DC | PRN
  Administered 2022-01-18: 30 mL

## 2022-01-18 MED ORDER — LACTATED RINGERS IV SOLN: INTRAVENOUS | Status: DC

## 2022-01-18 MED ORDER — ONDANSETRON HCL 4 MG/2ML IJ SOLN
INTRAMUSCULAR | Status: DC | PRN
  Administered 2022-01-18: 4 mg via INTRAVENOUS

## 2022-01-18 MED ORDER — DEXMEDETOMIDINE HCL IN NACL 80 MCG/20ML IV SOLN
INTRAVENOUS | Status: AC
  Filled 2022-01-18: qty 20

## 2022-01-18 MED ORDER — EPINEPHRINE PF 1 MG/ML IJ SOLN
INTRAMUSCULAR | Status: AC
  Filled 2022-01-18: qty 4

## 2022-01-18 MED ORDER — TRANEXAMIC ACID-NACL 1000-0.7 MG/100ML-% IV SOLN
1000.0000 mg | INTRAVENOUS | Status: AC
  Administered 2022-01-18: 1000 mg via INTRAVENOUS

## 2022-01-18 MED ORDER — SODIUM CHLORIDE 0.9 % IR SOLN
Status: DC | PRN
  Administered 2022-01-18: 3000 mL

## 2022-01-18 MED ORDER — BUPIVACAINE-EPINEPHRINE (PF) 0.25% -1:200000 IJ SOLN
INTRAMUSCULAR | Status: AC
  Filled 2022-01-18: qty 30

## 2022-01-18 MED ORDER — CEFAZOLIN SODIUM-DEXTROSE 2-4 GM/100ML-% IV SOLN
INTRAVENOUS | Status: AC
  Filled 2022-01-18: qty 100

## 2022-01-18 MED ORDER — MIDAZOLAM HCL 5 MG/5ML IJ SOLN
INTRAMUSCULAR | Status: DC | PRN
  Administered 2022-01-18: 2 mg via INTRAVENOUS

## 2022-01-18 MED ORDER — PHENYLEPHRINE HCL (PRESSORS) 10 MG/ML IV SOLN
INTRAVENOUS | Status: DC | PRN
  Administered 2022-01-18: 40 ug via INTRAVENOUS

## 2022-01-18 MED ORDER — DEXTROSE 50 % IV SOLN
INTRAVENOUS | Status: AC
  Filled 2022-01-18: qty 50

## 2022-01-18 MED ORDER — ACETAMINOPHEN 500 MG PO TABS: 1000.0000 mg | ORAL_TABLET | Freq: Once | ORAL | Status: AC

## 2022-01-18 MED ORDER — ACETAMINOPHEN 500 MG PO TABS
ORAL_TABLET | ORAL | Status: AC
  Filled 2022-01-18: qty 2

## 2022-01-18 MED ORDER — FENTANYL CITRATE (PF) 100 MCG/2ML IJ SOLN
INTRAMUSCULAR | Status: DC | PRN
  Administered 2022-01-18 (×2): 50 ug via INTRAVENOUS
  Administered 2022-01-18: 25 ug via INTRAVENOUS
  Administered 2022-01-18: 50 ug via INTRAVENOUS

## 2022-01-18 MED ORDER — BUPIVACAINE HCL (PF) 0.25 % IJ SOLN
INTRAMUSCULAR | Status: AC
  Filled 2022-01-18: qty 30

## 2022-01-18 MED ORDER — DEXMEDETOMIDINE (PRECEDEX) IN NS 20 MCG/5ML (4 MCG/ML) IV SYRINGE
PREFILLED_SYRINGE | INTRAVENOUS | Status: DC | PRN
  Administered 2022-01-18: 12 ug via INTRAVENOUS

## 2022-01-18 MED ORDER — PROPOFOL 500 MG/50ML IV EMUL
INTRAVENOUS | Status: DC | PRN
  Administered 2022-01-18: 150 ug/kg/min via INTRAVENOUS

## 2022-01-18 MED ORDER — VANCOMYCIN HCL 1000 MG IV SOLR
INTRAVENOUS | Status: AC
  Filled 2022-01-18: qty 20

## 2022-01-18 MED ORDER — TRANEXAMIC ACID-NACL 1000-0.7 MG/100ML-% IV SOLN
INTRAVENOUS | Status: AC
  Filled 2022-01-18: qty 100

## 2022-01-18 MED ORDER — FENTANYL CITRATE (PF) 100 MCG/2ML IJ SOLN
25.0000 ug | INTRAMUSCULAR | Status: DC | PRN
  Administered 2022-01-18 (×3): 50 ug via INTRAVENOUS

## 2022-01-18 MED ORDER — PHENYLEPHRINE 80 MCG/ML (10ML) SYRINGE FOR IV PUSH (FOR BLOOD PRESSURE SUPPORT)
PREFILLED_SYRINGE | INTRAVENOUS | Status: AC
  Filled 2022-01-18: qty 10

## 2022-01-18 MED ORDER — LIDOCAINE 2% (20 MG/ML) 5 ML SYRINGE
INTRAMUSCULAR | Status: DC | PRN
  Administered 2022-01-18: 30 mg via INTRAVENOUS

## 2022-01-18 MED ORDER — GABAPENTIN 300 MG PO CAPS
ORAL_CAPSULE | ORAL | Status: AC
  Filled 2022-01-18: qty 1

## 2022-01-18 MED ORDER — DEXAMETHASONE SODIUM PHOSPHATE 10 MG/ML IJ SOLN
INTRAMUSCULAR | Status: DC | PRN
  Administered 2022-01-18: 4 mg via INTRAVENOUS

## 2022-01-18 MED ORDER — ACETAMINOPHEN 500 MG PO TABS
1000.0000 mg | ORAL_TABLET | Freq: Once | ORAL | Status: AC
Start: 2022-01-18 — End: 2022-01-18
  Administered 2022-01-18: 1000 mg via ORAL

## 2022-01-18 MED ORDER — MIDAZOLAM HCL 2 MG/2ML IJ SOLN
INTRAMUSCULAR | Status: AC
  Filled 2022-01-18: qty 2

## 2022-01-18 MED ORDER — PROPOFOL 10 MG/ML IV BOLUS
INTRAVENOUS | Status: DC | PRN
  Administered 2022-01-18: 200 mg via INTRAVENOUS

## 2022-01-18 MED ORDER — GABAPENTIN 300 MG PO CAPS
300.0000 mg | ORAL_CAPSULE | Freq: Once | ORAL | Status: AC
  Administered 2022-01-18: 300 mg via ORAL

## 2022-01-18 MED ORDER — CEFAZOLIN SODIUM-DEXTROSE 2-4 GM/100ML-% IV SOLN
2.0000 g | INTRAVENOUS | Status: AC
  Administered 2022-01-18: 2 g via INTRAVENOUS

## 2022-01-18 SURGICAL SUPPLY — 56 items
APL PRP STRL LF DISP 70% ISPRP (MISCELLANEOUS) ×2
BANDAGE ESMARK 6X9 LF (GAUZE/BANDAGES/DRESSINGS) IMPLANT
BLADE EXCALIBUR 4.0X13 (MISCELLANEOUS) IMPLANT
BNDG CMPR 9X6 STRL LF SNTH (GAUZE/BANDAGES/DRESSINGS)
BNDG ELASTIC 4X5.8 VLCR STR LF (GAUZE/BANDAGES/DRESSINGS) IMPLANT
BNDG ELASTIC 6X5.8 VLCR STR LF (GAUZE/BANDAGES/DRESSINGS) ×2 IMPLANT
BNDG ESMARK 6X9 LF (GAUZE/BANDAGES/DRESSINGS)
CHLORAPREP W/TINT 26 (MISCELLANEOUS) ×3 IMPLANT
COOLER ICEMAN CLASSIC (MISCELLANEOUS) ×1 IMPLANT
CUFF TOURN SGL QUICK 34 (TOURNIQUET CUFF)
CUFF TRNQT CYL 34X4.125X (TOURNIQUET CUFF) ×1 IMPLANT
DISSECTOR  3.8MM X 13CM (MISCELLANEOUS) ×1
DISSECTOR 3.8MM X 13CM (MISCELLANEOUS) ×1 IMPLANT
DRAPE ARTHROSCOPY W/POUCH 90 (DRAPES) ×2 IMPLANT
DRAPE IMP U-DRAPE 54X76 (DRAPES) ×2 IMPLANT
DRAPE INCISE IOBAN 66X45 STRL (DRAPES) IMPLANT
DRAPE U-SHAPE 47X51 STRL (DRAPES) IMPLANT
DRSG PAD ABDOMINAL 8X10 ST (GAUZE/BANDAGES/DRESSINGS) IMPLANT
DW OUTFLOW CASSETTE/TUBE SET (MISCELLANEOUS) IMPLANT
ELECT REM PT RETURN 9FT ADLT (ELECTROSURGICAL)
ELECTRODE REM PT RTRN 9FT ADLT (ELECTROSURGICAL) IMPLANT
EXCALIBUR 3.8MM X 13CM (MISCELLANEOUS) IMPLANT
GAUZE 4X4 16PLY ~~LOC~~+RFID DBL (SPONGE) IMPLANT
GAUZE SPONGE 4X4 12PLY STRL (GAUZE/BANDAGES/DRESSINGS) ×2 IMPLANT
GAUZE XEROFORM 1X8 LF (GAUZE/BANDAGES/DRESSINGS) ×2 IMPLANT
GLOVE BIO SURGEON STRL SZ 6 (GLOVE) ×2 IMPLANT
GLOVE BIO SURGEON STRL SZ7.5 (GLOVE) ×2 IMPLANT
GLOVE BIOGEL PI IND STRL 6.5 (GLOVE) ×1 IMPLANT
GLOVE BIOGEL PI IND STRL 7.0 (GLOVE) IMPLANT
GLOVE BIOGEL PI IND STRL 7.5 (GLOVE) IMPLANT
GLOVE BIOGEL PI IND STRL 8 (GLOVE) ×1 IMPLANT
GLOVE BIOGEL PI INDICATOR 6.5 (GLOVE) ×1
GLOVE BIOGEL PI INDICATOR 7.0 (GLOVE) ×1
GLOVE BIOGEL PI INDICATOR 7.5 (GLOVE) ×1
GLOVE BIOGEL PI INDICATOR 8 (GLOVE) ×1
GLOVE SURG SS PI 7.0 STRL IVOR (GLOVE) ×1 IMPLANT
GLOVE SURG SS PI 7.5 STRL IVOR (GLOVE) ×1 IMPLANT
GOWN STRL REUS W/ TWL LRG LVL3 (GOWN DISPOSABLE) ×1 IMPLANT
GOWN STRL REUS W/ TWL XL LVL3 (GOWN DISPOSABLE) ×1 IMPLANT
GOWN STRL REUS W/TWL LRG LVL3 (GOWN DISPOSABLE) ×4
GOWN STRL REUS W/TWL XL LVL3 (GOWN DISPOSABLE) ×4
MANIFOLD NEPTUNE II (INSTRUMENTS) ×2 IMPLANT
NDL HYPO 18GX1.5 BLUNT FILL (NEEDLE) ×1 IMPLANT
NDL SAFETY ECLIPSE 18X1.5 (NEEDLE) ×1 IMPLANT
NEEDLE HYPO 18GX1.5 BLUNT FILL (NEEDLE) ×2 IMPLANT
NEEDLE HYPO 18GX1.5 SHARP (NEEDLE) ×2
PACK ARTHROSCOPY DSU (CUSTOM PROCEDURE TRAY) ×2 IMPLANT
PACK BASIN DAY SURGERY FS (CUSTOM PROCEDURE TRAY) ×2 IMPLANT
PAD COLD SHLDR WRAP-ON (PAD) ×2 IMPLANT
PADDING CAST COTTON 6X4 STRL (CAST SUPPLIES) ×2 IMPLANT
PORT APPOLLO RF 90DEGREE MULTI (SURGICAL WAND) ×2 IMPLANT
SLEEVE SCD COMPRESS KNEE MED (STOCKING) ×2 IMPLANT
SUT ETHILON 3 0 PS 1 (SUTURE) ×2 IMPLANT
SYR 5ML LL (SYRINGE) ×2 IMPLANT
TOWEL GREEN STERILE FF (TOWEL DISPOSABLE) ×4 IMPLANT
TUBING ARTHROSCOPY IRRIG 16FT (MISCELLANEOUS) ×2 IMPLANT

## 2022-01-18 NOTE — Anesthesia Preprocedure Evaluation (Addendum)
Anesthesia Evaluation  Patient identified by MRN, date of birth, ID band Patient awake    Reviewed: Allergy & Precautions, NPO status , Patient's Chart, lab work & pertinent test results  History of Anesthesia Complications (+) PONV and history of anesthetic complications  Airway Mallampati: I  TM Distance: >3 FB Neck ROM: Full    Dental no notable dental hx. (+) Teeth Intact, Dental Advisory Given   Pulmonary sleep apnea and Continuous Positive Airway Pressure Ventilation ,    Pulmonary exam normal breath sounds clear to auscultation       Cardiovascular hypertension, negative cardio ROS Normal cardiovascular exam Rhythm:Regular Rate:Normal     Neuro/Psych PSYCHIATRIC DISORDERS Anxiety Depression negative neurological ROS     GI/Hepatic negative GI ROS, Neg liver ROS, NASH   Endo/Other  diabetesHypothyroidism   Renal/GU negative Renal ROS  negative genitourinary   Musculoskeletal  (+) Arthritis , Fibromyalgia -  Abdominal   Peds  Hematology negative hematology ROS (+)   Anesthesia Other Findings   Reproductive/Obstetrics                            Anesthesia Physical Anesthesia Plan  ASA: 3  Anesthesia Plan: General   Post-op Pain Management: Tylenol PO (pre-op)* and Precedex   Induction: Intravenous  PONV Risk Score and Plan: 4 or greater and Ondansetron, Dexamethasone, Midazolam and TIVA  Airway Management Planned: LMA  Additional Equipment:   Intra-op Plan:   Post-operative Plan: Extubation in OR  Informed Consent: I have reviewed the patients History and Physical, chart, labs and discussed the procedure including the risks, benefits and alternatives for the proposed anesthesia with the patient or authorized representative who has indicated his/her understanding and acceptance.     Dental advisory given  Plan Discussed with: CRNA  Anesthesia Plan Comments:         Anesthesia Quick Evaluation

## 2022-01-18 NOTE — Anesthesia Procedure Notes (Signed)
Procedure Name: LMA Insertion Date/Time: 01/18/2022 1:03 PM  Performed by: Othman Masur, Ernesta Amble, CRNAPre-anesthesia Checklist: Patient identified, Emergency Drugs available, Suction available and Patient being monitored Patient Re-evaluated:Patient Re-evaluated prior to induction Oxygen Delivery Method: Circle system utilized Preoxygenation: Pre-oxygenation with 100% oxygen Induction Type: IV induction Ventilation: Mask ventilation without difficulty LMA: LMA inserted LMA Size: 4.0 Number of attempts: 1 Airway Equipment and Method: Bite block Placement Confirmation: positive ETCO2 Tube secured with: Tape Dental Injury: Teeth and Oropharynx as per pre-operative assessment

## 2022-01-18 NOTE — Brief Op Note (Signed)
   Brief Op Note  Date of Surgery: 01/18/2022  Preoperative Diagnosis: RIGHT KNEE SYMPTOMATIC PLICA  Postoperative Diagnosis: same  Procedure: Procedure(s): RIGHT KNEE ARTHROSCOPY WITH EXCISION PLICA  Implants: * No implants in log *  Surgeons: Surgeon(s): Vanetta Mulders, MD  Anesthesia: General    Estimated Blood Loss: See anesthesia record  Complications: None  Condition to PACU: Stable  Yevonne Pax, MD 01/18/2022 1:42 PM

## 2022-01-18 NOTE — Transfer of Care (Signed)
Immediate Anesthesia Transfer of Care Note  Patient: Melanie [name]  Procedure(s) Performed: RIGHT KNEE ARTHROSCOPY WITH EXCISION PLICA (Right: Knee)  Patient Location: PACU  Anesthesia Type:General  Level of Consciousness: awake, alert , oriented and patient cooperative  Airway & Oxygen Therapy: Patient Spontanous Breathing and Patient connected to face mask oxygen  Post-op Assessment: Report given to RN and Post -op Vital signs reviewed and stable  Post vital signs: Reviewed and stable  Last Vitals:  Vitals Value Taken Time  BP 96/61 01/18/22 1346  Temp    Pulse 73 01/18/22 1348  Resp 23 01/18/22 1348  SpO2 98 % 01/18/22 1348  Vitals shown include unvalidated device data.  Last Pain:  Vitals:   01/18/22 1215  TempSrc: Oral  PainSc: 4       Patients Stated Pain Goal: 4 (56/70/14 1030)  Complications: No notable events documented.

## 2022-01-18 NOTE — Op Note (Signed)
Date of Surgery: 01/18/2022  INDICATIONS: Melanie [name] is a 56 y.o.-year-old female with right knee pain and symptomatic plica and has failed strengthening as well as multiple injections.  She ultimately opted for arthroscopic debridement.  The risk and benefits of the procedure were discussed in detail and documented in the pre-operative evaluation.   PREOPERATIVE DIAGNOSIS: 1.  Right knee symptomatic plica  POSTOPERATIVE DIAGNOSIS: 1. Right knee symptomatic plica 2. Right knee medial meniscal tear  PROCEDURE: 1. Right knee limited synovectomy 2. Right knee medial meniscal debridement  SURGEON: Yevonne Pax MD  ASSISTANT: Raynelle Fanning, ATC  ANESTHESIA:  general plus inta-articular block  IV FLUIDS AND URINE: See anesthesia record.  ANTIBIOTICS: Ancef  ESTIMATED BLOOD LOSS: 10 mL.  IMPLANTS:  * No implants in log *  DRAINS: None  CULTURES: None  COMPLICATIONS: none  DESCRIPTION OF PROCEDURE:  Examination under anesthesia: A careful examination under anesthesia was performed.  Knee ROM motion was: 0-135 Lachman: Normal Pivot Shift: Normal Posterior drawer: normal.   Varus stability in full extension: normal.   Varus stability in 30 degrees of flexion: normal.  Valgus stability in full extension: normal.   Valgus stability in 30 degrees of flexion: normal.  Posterolateral drawer: normal   Intra-operative findings: A thorough arthroscopic examination of the knee was performed.  The findings are: 1. Suprapatellar pouch: Normal 2. Undersurface of median ridge: Normal 3. Medial patellar facet: Normal 4. Lateral patellar facet: Normal 5. Trochlea: Grade 1 cartilage changes 6. Lateral gutter/popliteus tendon: Significant synovitis 7. Hoffa's fat pad: Normal 8. Medial gutter/plica: Significant synovitis with hypertrophied plica 9. ACL: Normal 10. PCL: Normal 11. Medial meniscus: White White tear involving the posterior third of the meniscus without extension into  the root 12. Medial compartment cartilage: Normal 13. Lateral meniscus: Normal 14. Lateral compartment cartilage: Normal  I identified the patient in the pre-operative holding area.  I marked the operative knee with my initials. I reviewed the risks and benefits of the proposed surgical intervention and the patient wished to proceed.  Anesthesia performed a peripheral nerve block.  Patient was subsequently taken back to the operating room.  The patient was transferred to the operative suite and placed in the supine position with all bony prominences padded.     SCDs were placed on the non-operative lower extremity. Appropriate antibiotics was administered within 1 hour before incision. The operative lower extremity was then prepped and draped in standard fashion. A time out was performed confirming the correct extremity, correct patient and correct procedure.    A standard anterolateral portal was made with an 11 blade.  The ideal position for the anteromedial portal was established using a spinal needle.  This AM portal was then created under direct visualization with an 11 blade.  A full diagnostic arthroscopy was then performed, as described above, including probing of the chondral and meniscal surfaces.     There was a large medial plica identified.  A shaver was introduced into the knee and this was debrided.  Electrocautery was used to achieve hemostasis following this.    This time attention was turned to the medial meniscus.  Probe was brought into the knee and the root was probed.  This was stable.  A straight biter was brought into the knee and the posterior meniscal tear involving the white white zone was debrided back to healthy rim.  Approximately 3 mm of the white white junction was removed.  The meniscus was again probed and was stable  Instrument, sponge, and needle counts were correct prior to wound closure and at the conclusion of the case.  The patient was taken to the PACU without  complication       POSTOPERATIVE PLAN: She will be weightbearing as tolerated on the right leg.  She will be seen in 2 weeks for suture removal.  At time we will decide if physical therapy is needed.  She will be placed on aspirin for postop blood clot prevention  Yevonne Pax, MD 1:42 PM

## 2022-01-18 NOTE — Interval H&P Note (Signed)
History and Physical Interval Note:  01/18/2022 11:47 AM  Melanie [name]  has presented today for surgery, with the diagnosis of RIGHT KNEE SYMPTOMATIC PLICA.  The various methods of treatment have been discussed with the patient and family. After consideration of risks, benefits and other options for treatment, the patient has consented to  Procedure(s): RIGHT KNEE ARTHROSCOPY WITH EXCISION PLICA (Right) as a surgical intervention.  The patient's history has been reviewed, patient examined, no change in status, stable for surgery.  I have reviewed the patient's chart and labs.  Questions were answered to the patient's satisfaction.     Vanetta Mulders

## 2022-01-18 NOTE — Discharge Instructions (Addendum)
Discharge Instructions    Attending Surgeon: Vanetta Mulders, MD Office Phone Number: [phone]   Diagnosis and Procedures:    Surgeries Performed: Right knee plica excision and medial meniscal debridement  Discharge Plan:    Diet: Resume usual diet. Begin with light or bland foods.  Drink plenty of fluids.  Activity:  Activity as weight as tolerated. You are advised to go home directly from the hospital or surgical center. Restrict your activities.  GENERAL INSTRUCTIONS: 1.  Please apply ice to your wound to help with swelling and inflammation. This will improve your comfort and your overall recovery following surgery.     2. Please call Dr. Eddie Dibbles office at [phone] with questions Monday-Friday during business hours. If no one answers, please leave a message and someone should get back to the patient within 24 hours. For emergencies please call 911 or proceed to the emergency room.   3. Patient to notify surgical team if experiences any of the following: Bowel/Bladder dysfunction, uncontrolled pain, nerve/muscle weakness, incision with increased drainage or redness, nausea/vomiting and Fever greater than 101.0 F.  Be alert for signs of infection including redness, streaking, odor, fever or chills. Be alert for excessive pain or bleeding and notify your surgeon immediately.  WOUND INSTRUCTIONS:   Leave your dressing, cast, or splint in place until your post operative visit.  Keep it clean and dry.  Always keep the incision clean and dry until the staples/sutures are removed. If there is no drainage from the incision you should keep it open to air. If there is drainage from the incision you must keep it covered at all times until the drainage stops  Do not soak in a bath tub, hot tub, pool, lake or other body of water until 21 days after your surgery and your incision is completely dry and healed.  If you have removable sutures (or staples) they must be removed  10-14 days (unless otherwise instructed) from the day of your surgery.     1)  Elevate the extremity as much as possible.  2)  Keep the dressing clean and dry.  3)  Please call us if the dressing becomes wet or dirty.  4)  If you are experiencing worsening pain or worsening swelling, please call.     MEDICATIONS: Resume all previous home medications at the previous prescribed dose and frequency unless otherwise noted Start taking the  pain medications on an as-needed basis as prescribed  Please taper down pain medication over the next week following surgery.  Ideally you should not require a refill of any narcotic pain medication.  Take pain medication with food to minimize nausea. In addition to the prescribed pain medication, you may take over-the-counter pain relievers such as Tylenol.  Do NOT take additional tylenol if your pain medication already has tylenol in it.  Aspirin 331m daily for four weeks.      FOLLOWUP INSTRUCTIONS: 1. Follow up at the Physical Therapy Clinic 3-4 days following surgery. This appointment should be scheduled unless other arrangements have been made.The Physical Therapy scheduling number is [phone] an appointment has not already been arranged.  2. Contact Dr. BEddie Dibblesoffice during office hours at [phone] the practice after hours line at [phone] non-emergencies. For medical emergencies call 911.   Discharge Location: Home   Post Anesthesia Home Care Instructions  Activity: Get plenty of rest for the remainder of the day. A responsible individual must stay with you for 24 hours  following the procedure.  For the next 24 hours, DO NOT: -Drive a car -Paediatric nurse -Drink alcoholic beverages -Take any medication unless instructed by your physician -Make any legal decisions or sign important papers.  Meals: Start with liquid foods such as gelatin or soup. Progress to regular foods as tolerated. Avoid greasy, spicy, heavy  foods. If nausea and/or vomiting occur, drink only clear liquids until the nausea and/or vomiting subsides. Call your physician if vomiting continues.  Special Instructions/Symptoms: Your throat may feel dry or sore from the anesthesia or the breathing tube placed in your throat during surgery. If this causes discomfort, gargle with warm salt water. The discomfort should disappear within 24 hours.  If you had a scopolamine patch placed behind your ear for the management of post- operative nausea and/or vomiting:  1. The medication in the patch is effective for 72 hours, after which it should be removed.  Wrap patch in a tissue and discard in the trash. Wash hands thoroughly with soap and water. 2. You may remove the patch earlier than 72 hours if you experience unpleasant side effects which may include dry mouth, dizziness or visual disturbances. 3. Avoid touching the patch. Wash your hands with soap and water after contact with the patch.

## 2022-01-19 ENCOUNTER — Encounter (HOSPITAL_BASED_OUTPATIENT_CLINIC_OR_DEPARTMENT_OTHER): Payer: Self-pay | Admitting: Orthopaedic Surgery

## 2022-01-19 NOTE — Anesthesia Postprocedure Evaluation (Signed)
Anesthesia Post Note  Patient: Melanie [name]  Procedure(s) Performed: RIGHT KNEE ARTHROSCOPY WITH EXCISION PLICA (Right: Knee)     Patient location during evaluation: PACU Anesthesia Type: General Level of consciousness: awake and alert Pain management: pain level controlled Vital Signs Assessment: post-procedure vital signs reviewed and stable Respiratory status: spontaneous breathing, nonlabored ventilation, respiratory function stable and patient connected to nasal cannula oxygen Cardiovascular status: blood pressure returned to baseline and stable Postop Assessment: no apparent nausea or vomiting Anesthetic complications: no   No notable events documented.  Last Vitals:  Vitals:   01/18/22 1530 01/18/22 1630  BP: 118/74 124/72  Pulse: 67 63  Resp: 14 14  Temp:  36.7 C  SpO2: 97% 97%    Last Pain:  Vitals:   01/18/22 1630  TempSrc:   PainSc: 2                  Octavius Shin L Lyne Khurana

## 2022-01-20 ENCOUNTER — Ambulatory Visit (HOSPITAL_BASED_OUTPATIENT_CLINIC_OR_DEPARTMENT_OTHER): Payer: No Typology Code available for payment source | Admitting: Orthopaedic Surgery

## 2022-01-21 ENCOUNTER — Encounter (HOSPITAL_BASED_OUTPATIENT_CLINIC_OR_DEPARTMENT_OTHER): Payer: Self-pay | Admitting: Orthopaedic Surgery

## 2022-01-24 ENCOUNTER — Other Ambulatory Visit (HOSPITAL_COMMUNITY): Payer: No Typology Code available for payment source

## 2022-01-27 NOTE — Progress Notes (Signed)
Surgical Instructions    Your procedure is scheduled on Wednesday 02/02/22.   Report to [facility] Main Entrance "A" at 2:00 P.M., then check in with the Admitting office.  Call this number if you have problems the morning of surgery:  [phone]   If you have any questions prior to your surgery date call [phone]: Open Monday-Friday 8am-4pm    Remember:  Do not eat after midnight the night before your surgery  You may drink clear liquids until 1:00 P.M. the morning of your surgery.   Clear liquids allowed are: Water, Non-Citrus Juices (without pulp), Carbonated Beverages, Clear Tea, Black Coffee ONLY (NO MILK, CREAM OR POWDERED CREAMER of any kind), and Gatorade  Patient Instructions  The night before surgery:  No food after midnight. ONLY clear liquids after midnight   The day of surgery (if you have diabetes): Drink ONE (1) 12 oz G2 given to you in your pre admission testing appointment by 1:00 P.M. the morning of surgery. Drink in one sitting. Do not sip.  This drink was given to you during your hospital  pre-op appointment visit.  Nothing else to drink after completing the  12 oz bottle of G2.         If you have questions, please contact your surgeon's office.     Take these medicines the morning of surgery with A SIP OF WATER:   ipratropium (ATROVENT)  ondansetron (ZOFRAN-ODT)- If needed  oxyCODONE (OXY IR/ROXICODONE)- If needed  tiZANidine (ZANAFLEX)- If needed   As of today, STOP taking any Aspirin (unless otherwise instructed by your surgeon) Aleve, Naproxen, Ibuprofen, Motrin, Advil, Goody's, BC's, all herbal medications, fish oil, and all vitamins.  WHAT DO I DO ABOUT MY DIABETES MEDICATION?   Do not take oral diabetes medicines (pills) the morning of surgery.  DO NOT TAKE tirzepatide [facility]) the day of surgery.    The day of surgery, do not take other diabetes injectables, including Byetta (exenatide), Bydureon (exenatide ER), Victoza  (liraglutide), or Trulicity (dulaglutide).  HOW TO MANAGE YOUR DIABETES BEFORE AND AFTER SURGERY  Why is it important to control my blood sugar before and after surgery? Improving blood sugar levels before and after surgery helps healing and can limit problems. A way of improving blood sugar control is eating a healthy diet by:  Eating less sugar and carbohydrates  Increasing activity/exercise  Talking with your doctor about reaching your blood sugar goals High blood sugars (greater than 180 mg/dL) can raise your risk of infections and slow your recovery, so you will need to focus on controlling your diabetes during the weeks before surgery. Make sure that the doctor who takes care of your diabetes knows about your planned surgery including the date and location.  How do I manage my blood sugar before surgery? Check your blood sugar at least 4 times a day, starting 2 days before surgery, to make sure that the level is not too high or low.  Check your blood sugar the morning of your surgery when you wake up and every 2 hours until you get to the Short Stay unit.  If your blood sugar is less than 70 mg/dL, you will need to treat for low blood sugar: Do not take insulin. Treat a low blood sugar (less than 70 mg/dL) with  cup of clear juice (cranberry or apple), 4 glucose tablets, OR glucose gel. Recheck blood sugar in 15 minutes after treatment (to make sure it is greater than 70 mg/dL). If your blood sugar  is not greater than 70 mg/dL on recheck, call [phone] for further instructions. Report your blood sugar to the short stay nurse when you get to Short Stay.  If you are admitted to the hospital after surgery: Your blood sugar will be checked by the staff and you will probably be given insulin after surgery (instead of oral diabetes medicines) to make sure you have good blood sugar levels. The goal for blood sugar control after surgery is 80-180 mg/dL.            Do not wear  jewelry or makeup Do not wear lotions, powders, perfumes/colognes, or deodorant. Do not shave 48 hours prior to surgery.  Men may shave face and neck. Do not bring valuables to the hospital. Do not wear nail polish, gel polish, artificial nails, or any other type of covering on natural nails (fingers and toes) If you have artificial nails or gel coating that need to be removed by a nail salon, please have this removed prior to surgery. Artificial nails or gel coating may interfere with anesthesia's ability to adequately monitor your vital signs.  [facility] is not responsible for any belongings or valuables. .   Do NOT Smoke (Tobacco/Vaping)  24 hours prior to your procedure  If you use a CPAP at night, you may bring your mask for your overnight stay.   Contacts, glasses, hearing aids, dentures or partials may not be worn into surgery, please bring cases for these belongings   For patients admitted to the hospital, discharge time will be determined by your treatment team.   Patients discharged the day of surgery will not be allowed to drive home, and someone needs to stay with them for 24 hours.   SURGICAL WAITING ROOM VISITATION Patients having surgery or a procedure may have no more than 2 support people in the waiting area - these visitors may rotate.   Children under the age of 78 must have an adult with them who is not the patient. If the patient needs to stay at the hospital during part of their recovery, the visitor guidelines for inpatient rooms apply. Pre-op nurse will coordinate an appropriate time for 1 support person to accompany patient in pre-op.  This support person may not rotate.   Please refer to the [facility] website for the visitor guidelines for Inpatients (after your surgery is over and you are in a regular room).    Special instructions:    Oral Hygiene is also important to reduce your risk of infection.  Remember - BRUSH YOUR TEETH THE MORNING OF SURGERY WITH  YOUR REGULAR TOOTHPASTE   [facility]- Preparing For Surgery  Before surgery, you can play an important role. Because skin is not sterile, your skin needs to be as free of germs as possible. You can reduce the number of germs on your skin by washing with CHG (chlorahexidine gluconate) Soap before surgery.  CHG is an antiseptic cleaner which kills germs and bonds with the skin to continue killing germs even after washing.     Please do not use if you have an allergy to CHG or antibacterial soaps. If your skin becomes reddened/irritated stop using the CHG.  Do not shave (including legs and underarms) for at least 48 hours prior to first CHG shower. It is OK to shave your face.  Please follow these instructions carefully.     Shower the NIGHT BEFORE SURGERY and the MORNING OF SURGERY with CHG Soap.   If you chose to  wash your hair, wash your hair first as usual with your normal shampoo. After you shampoo, rinse your hair and body thoroughly to remove the shampoo.  Then [employer] and genitals (private parts) with your normal soap and rinse thoroughly to remove soap.  After that Use CHG Soap as you would any other liquid soap. You can apply CHG directly to the skin and wash gently with a scrungie or a clean washcloth.   Apply the CHG Soap to your body ONLY FROM THE NECK DOWN.  Do not use on open wounds or open sores. Avoid contact with your eyes, ears, mouth and genitals (private parts). Wash Face and genitals (private parts)  with your normal soap.   Wash thoroughly, paying special attention to the area where your surgery will be performed.  Thoroughly rinse your body with warm water from the neck down.  DO NOT shower/wash with your normal soap after using and rinsing off the CHG Soap.  Pat yourself dry with a CLEAN TOWEL.  Wear CLEAN PAJAMAS to bed the night before surgery  Place CLEAN SHEETS on your bed the night before your surgery  DO NOT SLEEP WITH PETS.   Day of Surgery:  Take  a shower with CHG soap. Wear Clean/Comfortable clothing the morning of surgery Do not apply any deodorants/lotions.   Remember to brush your teeth WITH YOUR REGULAR TOOTHPASTE.    If you received a COVID test during your pre-op visit, it is requested that you wear a mask when out in public, stay away from anyone that may not be feeling well, and notify your surgeon if you develop symptoms. If you have been in contact with anyone that has tested positive in the last 10 days, please notify your surgeon.    Please read over the following fact sheets that you were given.

## 2022-01-28 ENCOUNTER — Encounter (HOSPITAL_COMMUNITY): Payer: Self-pay

## 2022-01-28 ENCOUNTER — Other Ambulatory Visit: Payer: Self-pay

## 2022-01-28 ENCOUNTER — Encounter (HOSPITAL_COMMUNITY)
Admission: RE | Admit: 2022-01-28 | Discharge: 2022-01-28 | Disposition: A | Discharge status: Home / Self Care | Payer: No Typology Code available for payment source | Source: Ambulatory Visit | Attending: Orthopedic Surgery | Admitting: Orthopedic Surgery

## 2022-01-28 VITALS — BP 137/86 | HR 71 | Temp 97.7°F | Resp 17 | Ht 67.0 in | Wt 215.7 lb

## 2022-01-28 DIAGNOSIS — Z01812 Encounter for preprocedural laboratory examination: Secondary | ICD-10-CM | POA: Diagnosis present

## 2022-01-28 DIAGNOSIS — Z01818 Encounter for other preprocedural examination: Secondary | ICD-10-CM

## 2022-01-28 LAB — SURGICAL PCR SCREEN
MRSA, PCR: NEGATIVE
Staphylococcus aureus: POSITIVE — AB

## 2022-01-28 LAB — GLUCOSE, CAPILLARY: Glucose-Capillary: 110 mg/dL — ABNORMAL HIGH (ref 70–99)

## 2022-01-28 LAB — CBC
HCT: 42.7 % (ref 36.0–46.0)
Hemoglobin: 14.4 g/dL (ref 12.0–15.0)
MCH: 31.1 pg (ref 26.0–34.0)
MCHC: 33.7 g/dL (ref 30.0–36.0)
MCV: 92.2 fL (ref 80.0–100.0)
Platelets: 343 10*3/uL (ref 150–400)
RBC: 4.63 MIL/uL (ref 3.87–5.11)
RDW: 11.6 % (ref 11.5–15.5)
WBC: 5.5 10*3/uL (ref 4.0–10.5)
nRBC: 0 % (ref 0.0–0.2)

## 2022-01-28 LAB — HEMOGLOBIN A1C
Hgb A1c MFr Bld: 5.7 % — ABNORMAL HIGH (ref 4.8–5.6)
Mean Plasma Glucose: 116.89 mg/dL

## 2022-01-28 NOTE — Progress Notes (Addendum)
Surgical Instructions    Your procedure is scheduled on Wednesday 02/02/22.   Report to [facility] Main Entrance "A" at 1:00 P.M., then check in with the Admitting office.  Call this number if you have problems the morning of surgery:  [phone]   If you have any questions prior to your surgery date call [phone]: Open Monday-Friday 8am-4pm    Remember:  Do not eat after midnight the night before your surgery  You may drink clear liquids until 1:00 P.M. the morning of your surgery.   Clear liquids allowed are: Water, Non-Citrus Juices (without pulp), Carbonated Beverages, Clear Tea, Black Coffee ONLY (NO MILK, CREAM OR POWDERED CREAMER of any kind), and Gatorade  Patient Instructions  The night before surgery:  No food after midnight. ONLY clear liquids after midnight   The day of surgery (if you have diabetes): Drink ONE (1) 12 oz G2 given to you in your pre admission testing appointment by 1:00 P.M. the morning of surgery. Drink in one sitting. Do not sip.  This drink was given to you during your hospital  pre-op appointment visit.  Nothing else to drink after completing the  12 oz bottle of G2.         If you have questions, please contact your surgeon's office.     Take these medicines the morning of surgery with A SIP OF WATER:   ipratropium (ATROVENT)  ondansetron (ZOFRAN-ODT)- If needed  oxyCODONE (OXY IR/ROXICODONE)- If needed  tiZANidine (ZANAFLEX)- If needed  Tylenol - if needed   As of today, STOP taking any Aspirin (unless otherwise instructed by your surgeon) Aleve, Naproxen, Ibuprofen, Motrin, Advil, Goody's, BC's, all herbal medications, fish oil, and all vitamins.  WHAT DO I DO ABOUT MY DIABETES MEDICATION?   Do not take oral diabetes medicines (pills) the morning of surgery.  DO NOT TAKE tirzepatide [facility]) the day of surgery.    The day of surgery, do not take other diabetes injectables, including Byetta (exenatide), Bydureon (exenatide  ER), Victoza (liraglutide), or Trulicity (dulaglutide).  HOW TO MANAGE YOUR DIABETES BEFORE AND AFTER SURGERY  Why is it important to control my blood sugar before and after surgery? Improving blood sugar levels before and after surgery helps healing and can limit problems. A way of improving blood sugar control is eating a healthy diet by:  Eating less sugar and carbohydrates  Increasing activity/exercise  Talking with your doctor about reaching your blood sugar goals High blood sugars (greater than 180 mg/dL) can raise your risk of infections and slow your recovery, so you will need to focus on controlling your diabetes during the weeks before surgery. Make sure that the doctor who takes care of your diabetes knows about your planned surgery including the date and location.  How do I manage my blood sugar before surgery? Check your blood sugar at least 4 times a day, starting 2 days before surgery, to make sure that the level is not too high or low.  Check your blood sugar the morning of your surgery when you wake up and every 2 hours until you get to the Short Stay unit.  If your blood sugar is less than 70 mg/dL, you will need to treat for low blood sugar: Do not take insulin. Treat a low blood sugar (less than 70 mg/dL) with  cup of clear juice (cranberry or apple), 4 glucose tablets, OR glucose gel. Recheck blood sugar in 15 minutes after treatment (to make sure it is greater than 70  mg/dL). If your blood sugar is not greater than 70 mg/dL on recheck, call [phone] for further instructions. Report your blood sugar to the short stay nurse when you get to Short Stay.  If you are admitted to the hospital after surgery: Your blood sugar will be checked by the staff and you will probably be given insulin after surgery (instead of oral diabetes medicines) to make sure you have good blood sugar levels. The goal for blood sugar control after surgery is 80-180 mg/dL.            Do  not wear jewelry or makeup Do not wear lotions, powders, perfumes/colognes, or deodorant. Do not shave 48 hours prior to surgery.  Men may shave face and neck. Do not bring valuables to the hospital. Do not wear nail polish, gel polish, artificial nails, or any other type of covering on natural nails (fingers and toes) If you have artificial nails or gel coating that need to be removed by a nail salon, please have this removed prior to surgery. Artificial nails or gel coating may interfere with anesthesia's ability to adequately monitor your vital signs.  [facility] is not responsible for any belongings or valuables. .   Do NOT Smoke (Tobacco/Vaping)  24 hours prior to your procedure  If you use a CPAP at night, you may bring your mask for your overnight stay.   Contacts, glasses, hearing aids, dentures or partials may not be worn into surgery, please bring cases for these belongings   For patients admitted to the hospital, discharge time will be determined by your treatment team.   Patients discharged the day of surgery will not be allowed to drive home, and someone needs to stay with them for 24 hours.   SURGICAL WAITING ROOM VISITATION Patients having surgery or a procedure may have no more than 2 support people in the waiting area - these visitors may rotate.   Children under the age of 35 must have an adult with them who is not the patient. If the patient needs to stay at the hospital during part of their recovery, the visitor guidelines for inpatient rooms apply. Pre-op nurse will coordinate an appropriate time for 1 support person to accompany patient in pre-op.  This support person may not rotate.   Please refer to the [facility] website for the visitor guidelines for Inpatients (after your surgery is over and you are in a regular room).    Special instructions:    Oral Hygiene is also important to reduce your risk of infection.  Remember - BRUSH YOUR TEETH THE MORNING OF  SURGERY WITH YOUR REGULAR TOOTHPASTE   [place]- Preparing For Surgery  Before surgery, you can play an important role. Because skin is not sterile, your skin needs to be as free of germs as possible. You can reduce the number of germs on your skin by washing with CHG (chlorahexidine gluconate) Soap before surgery.  CHG is an antiseptic cleaner which kills germs and bonds with the skin to continue killing germs even after washing.     Please do not use if you have an allergy to CHG or antibacterial soaps. If your skin becomes reddened/irritated stop using the CHG.  Do not shave (including legs and underarms) for at least 48 hours prior to first CHG shower. It is OK to shave your face.  Please follow these instructions carefully.     Shower the NIGHT BEFORE SURGERY and the MORNING OF SURGERY with CHG Soap.  If you chose to wash your hair, wash your hair first as usual with your normal shampoo. After you shampoo, rinse your hair and body thoroughly to remove the shampoo.  Then [employer] and genitals (private parts) with your normal soap and rinse thoroughly to remove soap.  After that Use CHG Soap as you would any other liquid soap. You can apply CHG directly to the skin and wash gently with a scrungie or a clean washcloth.   Apply the CHG Soap to your body ONLY FROM THE NECK DOWN.  Do not use on open wounds or open sores. Avoid contact with your eyes, ears, mouth and genitals (private parts). Wash Face and genitals (private parts)  with your normal soap.   Wash thoroughly, paying special attention to the area where your surgery will be performed.  Thoroughly rinse your body with warm water from the neck down.  DO NOT shower/wash with your normal soap after using and rinsing off the CHG Soap.  Pat yourself dry with a CLEAN TOWEL.  Wear CLEAN PAJAMAS to bed the night before surgery  Place CLEAN SHEETS on your bed the night before your surgery  DO NOT SLEEP WITH PETS.   Day of  Surgery:  Take a shower with CHG soap. Wear Clean/Comfortable clothing the morning of surgery Do not apply any deodorants/lotions.   Remember to brush your teeth WITH YOUR REGULAR TOOTHPASTE.    If you received a COVID test during your pre-op visit, it is requested that you wear a mask when out in public, stay away from anyone that may not be feeling well, and notify your surgeon if you develop symptoms. If you have been in contact with anyone that has tested positive in the last 10 days, please notify your surgeon.    Please read over the following fact sheets that you were given.

## 2022-01-28 NOTE — Progress Notes (Addendum)
PCP - Samuel Bouche, NP Cardiologist - denies  PPM/ICD - denies Device Orders - n/a Rep Notified - n/a  Chest x-ray - n/a EKG - 01/14/2022 Stress Test - denies ECHO - denies Cardiac Cath - denies  Sleep Study - yes CPAP - yes  Fasting Blood Sugar - 86 - 105 Checks Blood Sugar 1-2 times a week CBG today - 110 A1C done in PAT on 01/28/2022  Blood Thinner Instructions: n/a  Aspirin Instructions: last dose - 01/26/22 - per patient  Patient was instructed: As of today, STOP taking any Aspirin (unless otherwise instructed by your surgeon) Aleve, Naproxen, Ibuprofen, Motrin, Advil, Goody's, BC's, all herbal medications, fish oil, and all vitamins.  ERAS Protcol - yes PRE-SURGERY - G2- until 13:00 o'clock  COVID TEST- n/a   Anesthesia review: yes - review EKG 01/14/2022  Patient denies shortness of breath, fever, cough and chest pain at PAT appointment   All instructions explained to the patient, with a verbal understanding of the material. Patient agrees to go over the instructions while at home for a better understanding. Patient also instructed to self quarantine after being tested for COVID-19. The opportunity to ask questions was provided.

## 2022-01-31 ENCOUNTER — Other Ambulatory Visit: Payer: Self-pay | Admitting: Medical-Surgical

## 2022-01-31 ENCOUNTER — Ambulatory Visit (INDEPENDENT_AMBULATORY_CARE_PROVIDER_SITE_OTHER): Payer: No Typology Code available for payment source | Admitting: Orthopaedic Surgery

## 2022-01-31 ENCOUNTER — Encounter: Payer: Self-pay | Admitting: Medical-Surgical

## 2022-01-31 ENCOUNTER — Other Ambulatory Visit (HOSPITAL_COMMUNITY): Payer: Self-pay

## 2022-01-31 DIAGNOSIS — M6751 Plica syndrome, right knee: Secondary | ICD-10-CM

## 2022-01-31 MED ORDER — ONDANSETRON 8 MG PO TBDP
8.0000 mg | ORAL_TABLET | Freq: Three times a day (TID) | ORAL | 3 refills | Status: DC | PRN
  Filled 2022-01-31: qty 20, 7d supply, fill #0

## 2022-01-31 NOTE — Progress Notes (Signed)
Post Operative Evaluation    Procedure/Date of Surgery: Right knee medial meniscal debridement and plica excision 9/53  Interval History:    Presents today for 2-week follow-up status post the above procedure.  Overall she is doing extremely well.  He states that the majority of her knee pain has resolved at this time.  She is overall quite happy with range of motion and she is now walking without any type of antalgic gait.  She is working on a home exercise program.  She does currently have a cervical spine procedure pending   PMH/PSH/Family History/Social History/Meds/Allergies:    Past Medical History:  Diagnosis Date   ADHD (attention deficit hyperactivity disorder)    Arthritis    Bowel obstruction (HCC)    Constipation 07/01/2020   Diabetes mellitus without complication ([place])    Diverticulosis    Fibromyalgia    H/O: hysterectomy 02/27/2018   History of prediabetes 02/27/2018   Hypertension    Post-operative nausea and vomiting    SBO (small bowel obstruction) ([place]) 04/29/2020   Sleep apnea    uses CPAP   Past Surgical History:  Procedure Laterality Date   ABDOMINAL HYSTERECTOMY     COLONOSCOPY  20 years ago    in [place]=diverticulosis    KNEE ARTHROSCOPY WITH EXCISION PLICA Right 08/05/3341   Procedure: RIGHT KNEE ARTHROSCOPY WITH EXCISION PLICA;  Surgeon: Vanetta Mulders, MD;  Location: [facility];  Service: Orthopedics;  Laterality: Right;   LAPAROSCOPY     due to bowel perforation   MYOMECTOMY     OOPHORECTOMY     unilateral   Social History   Socioeconomic History   Marital status: Divorced    Spouse name: Not on file   Number of children: Not on file   Years of education: Not on file   Highest education level: Not on file  Occupational History   Not on file  Tobacco Use   Smoking status: Never   Smokeless tobacco: Never  Vaping Use   Vaping Use: Never used  Substance and Sexual Activity   Alcohol  use: Yes    Comment: Occassionally   Drug use: Never   Sexual activity: Not Currently    Birth control/protection: Surgical  Other Topics Concern   Not on file  Social History Narrative   Not on file   Social Determinants of Health   Financial Resource Strain: Not on file  Food Insecurity: Not on file  Transportation Needs: Not on file  Physical Activity: Not on file  Stress: Not on file  Social Connections: Not on file   Family History  Problem Relation Age of Onset   Lung cancer Father    Diabetes Mother    Skin cancer Mother    Colon cancer Neg Hx    Colon polyps Neg Hx    Esophageal cancer Neg Hx    Rectal cancer Neg Hx    Stomach cancer Neg Hx    Allergies  Allergen Reactions   Morphine Other (See Comments)    mental status alerted   Sulfa Antibiotics Hives   Clarithromycin Rash and Other (See Comments)    Other Reaction: Fever   Current Outpatient Medications  Medication Sig Dispense Refill   acetaminophen (TYLENOL) 500 MG tablet Take 500 mg by mouth every 12 (twelve) hours as  needed (pain).     AMBULATORY NON FORMULARY MEDICATION Continuous positive airway pressure (CPAP) machine set at Autopap (8-56m of water pressure), with all supplemental supplies as needed. 1 each 0   aspirin EC 325 MG tablet Take 1 tablet (325 mg total) by mouth daily. (Patient not taking: Reported on 01/28/2022) 30 tablet 0   blood glucose meter kit and supplies KIT Dispense based on patient and insurance preference. Use up to four times daily as directed. Please include lancets, test strips, control solution. 1 each 0   celecoxib (CELEBREX) 200 MG capsule TAKE 1 TO 2 CAPSULES BY MOUTH DAILY AS NEEDED FOR PAIN (Patient not taking: Reported on 01/28/2022) 180 capsule 0   ipratropium (ATROVENT) 0.03 % nasal spray PLACE 2 SPRAYS INTO BOTH NOSTRILS TWO TIMES DAILY (Patient taking differently: Place 2 sprays into both nostrils See admin instructions. Instill 2 sprays into each nostril ever  morning, may use again the evening as needed for congestion) 30 mL 2   levocetirizine (XYZAL) 5 MG tablet TAKE 1 TABLET BY MOUTH EVERY EVENING (Patient taking differently: Take 5 mg by mouth every evening.) 90 tablet 1   lisdexamfetamine (VYVANSE) 60 MG capsule Take 1 capsule by mouth daily. (Patient taking differently: Take 60 mg by mouth every morning.) 30 capsule 0   lisdexamfetamine (VYVANSE) 60 MG capsule Take 1 capsule by mouth every morning. (09/09/21) (Patient not taking: Reported on 01/28/2022) 30 capsule 0   losartan (COZAAR) 50 MG tablet Take 1 tablet (50 mg total) by mouth daily. (Patient taking differently: Take 50 mg by mouth every evening.) 90 tablet 1   Naltrexone POWD 1.5 mg by mouth at night for 14 days then increasing to 3 mg by mouth at night. Dispense quantity sufficient for 90 days (Patient not taking: Reported on 01/28/2022) 250 g 3   ondansetron (ZOFRAN-ODT) 8 MG disintegrating tablet Dissolve 1 tablet by mouth every 8 hours as needed for nausea. 20 tablet 3   OVER THE COUNTER MEDICATION Take 4 tablets by mouth every morning. Multivitamin with fish oil - gummies (Patient not taking: Reported on 01/28/2022)     oxyCODONE (OXY IR/ROXICODONE) 5 MG immediate release tablet Take 1 tablet (5 mg total) by mouth every 4 (four) hours as needed (severe pain). 20 tablet 0   tirzepatide (MOUNJARO) 5 MG/0.5ML Pen Inject 5 mg into the skin once a week. (Patient not taking: Reported on 01/28/2022) 6 mL 0   tiZANidine (ZANAFLEX) 4 MG tablet Take 1 tablet (4 mg total) by mouth every 8 (eight) hours as needed for muscle spasms. 60 tablet 1   No current facility-administered medications for this visit.   No results found.  Review of Systems:   A ROS was performed including pertinent positives and negatives as documented in the HPI.   Musculoskeletal Exam:    There were no vitals taken for this visit.  Right knee portals are well appearing and well-healing.  Range of motion is from -2 to 130  degrees without pain.  No joint line tenderness.  Very mild swelling.  Negative Lachman.  Negative McMurray bilaterally  Imaging:    None  I personally reviewed and interpreted the radiographs.   Assessment:   56year old female who is 2 weeks status post right knee plica excision and medial meniscal debridement overall doing extremely well.  At this time she will continue to be activity as tolerated.  I have given her a series of home exercises she can work on in a supervised fashion.  I will plan to see her back in 4 weeks  Plan :    -Return to clinic in 4 weeks for final check      I personally saw and evaluated the patient, and participated in the management and treatment plan.  Vanetta Mulders, MD Attending Physician, Orthopedic Surgery  This document was dictated using Dragon voice recognition software. A reasonable attempt at proof reading has been made to minimize errors.

## 2022-01-31 NOTE — Progress Notes (Unsigned)
   Established Patient Office Visit  Subjective   Patient ID: Melanie [name], female   DOB: 05-20-1966 Age: 56 y.o. MRN: [id number]   No chief complaint on file.   HPI Pleasant 56 year old female presenting today for the following:  DM:  HTN:  OSA on CPAP:  HLD:  NASH:  Hypothyroidism:  ADHD:   Objective:    There were no vitals filed for this visit.   Physical Exam    No results found for this or any previous visit (from the past 24 hour(s)).   {Labs (Optional):23779}  The 10-year ASCVD risk score (Arnett DK, et al., 2019) is: 5.8%   Values used to calculate the score:     Age: 34 years     Sex: Female     Is Non-Hispanic African American: No     Diabetic: Yes     Tobacco smoker: No     Systolic Blood Pressure: 711 mmHg     Is BP treated: Yes     HDL Cholesterol: 63 mg/dL     Total Cholesterol: 246 mg/dL   Assessment & Plan:   No problem-specific Assessment & Plan notes found for this encounter.   No follow-ups on file.  ___________________________________________ Clearnce Sorrel, DNP, APRN, FNP-BC [facility]

## 2022-01-31 NOTE — Telephone Encounter (Signed)
Last office visit 12/17/2021  Last filled 06/07/2021

## 2022-02-01 ENCOUNTER — Encounter: Payer: No Typology Code available for payment source | Admitting: Medical-Surgical

## 2022-02-01 ENCOUNTER — Other Ambulatory Visit (HOSPITAL_COMMUNITY): Payer: Self-pay

## 2022-02-02 ENCOUNTER — Encounter (HOSPITAL_COMMUNITY)
Admission: RE | Disposition: A | Discharge status: Home / Self Care | Payer: Self-pay | Source: Home / Self Care | Attending: Orthopedic Surgery

## 2022-02-02 ENCOUNTER — Other Ambulatory Visit (HOSPITAL_COMMUNITY): Payer: Self-pay

## 2022-02-02 ENCOUNTER — Encounter (HOSPITAL_COMMUNITY): Payer: Self-pay | Admitting: Orthopedic Surgery

## 2022-02-02 ENCOUNTER — Ambulatory Visit (HOSPITAL_COMMUNITY)
Admission: RE | Admit: 2022-02-02 | Discharge: 2022-02-02 | Disposition: A | Discharge status: Home / Self Care | Payer: No Typology Code available for payment source | Attending: Orthopedic Surgery | Admitting: Orthopedic Surgery

## 2022-02-02 ENCOUNTER — Ambulatory Visit (HOSPITAL_BASED_OUTPATIENT_CLINIC_OR_DEPARTMENT_OTHER): Payer: No Typology Code available for payment source | Admitting: Certified Registered Nurse Anesthetist

## 2022-02-02 ENCOUNTER — Ambulatory Visit (HOSPITAL_COMMUNITY): Payer: No Typology Code available for payment source | Admitting: Physician Assistant

## 2022-02-02 ENCOUNTER — Ambulatory Visit (HOSPITAL_COMMUNITY): Payer: No Typology Code available for payment source

## 2022-02-02 DIAGNOSIS — G4730 Sleep apnea, unspecified: Secondary | ICD-10-CM | POA: Insufficient documentation

## 2022-02-02 DIAGNOSIS — M4802 Spinal stenosis, cervical region: Secondary | ICD-10-CM | POA: Insufficient documentation

## 2022-02-02 DIAGNOSIS — E669 Obesity, unspecified: Secondary | ICD-10-CM | POA: Insufficient documentation

## 2022-02-02 DIAGNOSIS — E119 Type 2 diabetes mellitus without complications: Secondary | ICD-10-CM | POA: Insufficient documentation

## 2022-02-02 DIAGNOSIS — M1990 Unspecified osteoarthritis, unspecified site: Secondary | ICD-10-CM | POA: Diagnosis not present

## 2022-02-02 DIAGNOSIS — M5412 Radiculopathy, cervical region: Secondary | ICD-10-CM

## 2022-02-02 DIAGNOSIS — Z01818 Encounter for other preprocedural examination: Secondary | ICD-10-CM

## 2022-02-02 DIAGNOSIS — Z79899 Other long term (current) drug therapy: Secondary | ICD-10-CM | POA: Diagnosis not present

## 2022-02-02 DIAGNOSIS — M797 Fibromyalgia: Secondary | ICD-10-CM | POA: Diagnosis not present

## 2022-02-02 DIAGNOSIS — Z6833 Body mass index (BMI) 33.0-33.9, adult: Secondary | ICD-10-CM | POA: Diagnosis not present

## 2022-02-02 DIAGNOSIS — I10 Essential (primary) hypertension: Secondary | ICD-10-CM

## 2022-02-02 DIAGNOSIS — M5010 Cervical disc disorder with radiculopathy, unspecified cervical region: Secondary | ICD-10-CM

## 2022-02-02 DIAGNOSIS — Z7984 Long term (current) use of oral hypoglycemic drugs: Secondary | ICD-10-CM | POA: Diagnosis not present

## 2022-02-02 HISTORY — PX: ANTERIOR CERVICAL DECOMP/DISCECTOMY FUSION: SHX1161

## 2022-02-02 LAB — GLUCOSE, CAPILLARY
Glucose-Capillary: 90 mg/dL (ref 70–99)
Glucose-Capillary: 126 mg/dL — ABNORMAL HIGH (ref 70–99)

## 2022-02-02 SURGERY — ANTERIOR CERVICAL DECOMPRESSION/DISCECTOMY FUSION 2 LEVELS
Anesthesia: General

## 2022-02-02 MED ORDER — LIDOCAINE 2% (20 MG/ML) 5 ML SYRINGE
INTRAMUSCULAR | Status: DC | PRN
  Administered 2022-02-02: 40 mg via INTRAVENOUS
  Administered 2022-02-02: 60 mg via INTRAVENOUS

## 2022-02-02 MED ORDER — CEFAZOLIN SODIUM-DEXTROSE 2-4 GM/100ML-% IV SOLN
2.0000 g | INTRAVENOUS | Status: AC
  Administered 2022-02-02: 2 g via INTRAVENOUS
  Filled 2022-02-02: qty 100

## 2022-02-02 MED ORDER — MIDAZOLAM HCL 2 MG/2ML IJ SOLN
INTRAMUSCULAR | Status: DC | PRN
  Administered 2022-02-02: 2 mg via INTRAVENOUS

## 2022-02-02 MED ORDER — OXYCODONE HCL 5 MG/5ML PO SOLN
ORAL | Status: AC
  Filled 2022-02-02: qty 5

## 2022-02-02 MED ORDER — PROPOFOL 10 MG/ML IV BOLUS
INTRAVENOUS | Status: AC
  Filled 2022-02-02: qty 20

## 2022-02-02 MED ORDER — DEXMEDETOMIDINE (PRECEDEX) IN NS 20 MCG/5ML (4 MCG/ML) IV SYRINGE
PREFILLED_SYRINGE | INTRAVENOUS | Status: DC | PRN
  Administered 2022-02-02 (×2): 8 ug via INTRAVENOUS

## 2022-02-02 MED ORDER — 0.9 % SODIUM CHLORIDE (POUR BTL) OPTIME
TOPICAL | Status: DC | PRN
  Administered 2022-02-02: 1000 mL

## 2022-02-02 MED ORDER — SCOPOLAMINE 1 MG/3DAYS TD PT72
MEDICATED_PATCH | TRANSDERMAL | Status: DC | PRN
  Administered 2022-02-02: 1 via TRANSDERMAL

## 2022-02-02 MED ORDER — MIDAZOLAM HCL 2 MG/2ML IJ SOLN
INTRAMUSCULAR | Status: AC
Start: 2022-02-02 — End: ?
  Filled 2022-02-02: qty 2

## 2022-02-02 MED ORDER — LACTATED RINGERS IV SOLN: INTRAVENOUS | Status: DC | PRN

## 2022-02-02 MED ORDER — ONDANSETRON HCL 4 MG/2ML IJ SOLN
INTRAMUSCULAR | Status: DC | PRN
  Administered 2022-02-02: 4 mg via INTRAVENOUS

## 2022-02-02 MED ORDER — PHENYLEPHRINE HCL-NACL 20-0.9 MG/250ML-% IV SOLN
INTRAVENOUS | Status: DC | PRN
  Administered 2022-02-02: 25 ug/min via INTRAVENOUS

## 2022-02-02 MED ORDER — PROPOFOL 10 MG/ML IV BOLUS
INTRAVENOUS | Status: DC | PRN
  Administered 2022-02-02: 150 mg via INTRAVENOUS

## 2022-02-02 MED ORDER — HYDROMORPHONE HCL 1 MG/ML IJ SOLN
0.2500 mg | INTRAMUSCULAR | Status: DC | PRN
  Administered 2022-02-02: 0.5 mg via INTRAVENOUS
  Administered 2022-02-02 (×2): 0.25 mg via INTRAVENOUS

## 2022-02-02 MED ORDER — THROMBIN 20000 UNITS EX SOLR
CUTANEOUS | Status: AC
Start: 2022-02-02 — End: ?
  Filled 2022-02-02: qty 20000

## 2022-02-02 MED ORDER — ONDANSETRON HCL 4 MG/2ML IJ SOLN
INTRAMUSCULAR | Status: AC
  Filled 2022-02-02: qty 2

## 2022-02-02 MED ORDER — HYDROMORPHONE HCL 1 MG/ML IJ SOLN
INTRAMUSCULAR | Status: AC
  Filled 2022-02-02: qty 1

## 2022-02-02 MED ORDER — THROMBIN 20000 UNITS EX SOLR
CUTANEOUS | Status: DC | PRN
  Administered 2022-02-02: 20000 [IU] via TOPICAL

## 2022-02-02 MED ORDER — ACETAMINOPHEN 500 MG PO TABS
1000.0000 mg | ORAL_TABLET | Freq: Once | ORAL | Status: AC
Start: 2022-02-02 — End: 2022-02-02

## 2022-02-02 MED ORDER — FENTANYL CITRATE (PF) 250 MCG/5ML IJ SOLN
INTRAMUSCULAR | Status: AC
  Filled 2022-02-02: qty 5

## 2022-02-02 MED ORDER — ROCURONIUM BROMIDE 10 MG/ML (PF) SYRINGE
PREFILLED_SYRINGE | INTRAVENOUS | Status: AC
  Filled 2022-02-02: qty 10

## 2022-02-02 MED ORDER — ROCURONIUM BROMIDE 10 MG/ML (PF) SYRINGE
PREFILLED_SYRINGE | INTRAVENOUS | Status: DC | PRN
  Administered 2022-02-02: 70 mg via INTRAVENOUS
  Administered 2022-02-02: 30 mg via INTRAVENOUS

## 2022-02-02 MED ORDER — OXYCODONE HCL 5 MG/5ML PO SOLN
5.0000 mg | ORAL | Status: DC | PRN
  Administered 2022-02-02: 5 mg via ORAL

## 2022-02-02 MED ORDER — ORAL CARE MOUTH RINSE: 15.0000 mL | Freq: Once | OROMUCOSAL | Status: AC

## 2022-02-02 MED ORDER — SCOPOLAMINE 1 MG/3DAYS TD PT72
MEDICATED_PATCH | TRANSDERMAL | Status: AC
  Filled 2022-02-02: qty 1

## 2022-02-02 MED ORDER — DIPHENHYDRAMINE HCL 50 MG/ML IJ SOLN
INTRAMUSCULAR | Status: DC | PRN
  Administered 2022-02-02: 6.25 mg via INTRAVENOUS

## 2022-02-02 MED ORDER — SUGAMMADEX SODIUM 200 MG/2ML IV SOLN
INTRAVENOUS | Status: DC | PRN
  Administered 2022-02-02: 50 mg via INTRAVENOUS
  Administered 2022-02-02: 350 mg via INTRAVENOUS

## 2022-02-02 MED ORDER — LIDOCAINE 2% (20 MG/ML) 5 ML SYRINGE
INTRAMUSCULAR | Status: AC
  Filled 2022-02-02: qty 5

## 2022-02-02 MED ORDER — CHLORHEXIDINE GLUCONATE 0.12 % MT SOLN
15.0000 mL | Freq: Once | OROMUCOSAL | Status: AC
  Administered 2022-02-02: 15 mL via OROMUCOSAL
  Filled 2022-02-02: qty 15

## 2022-02-02 MED ORDER — FENTANYL CITRATE (PF) 250 MCG/5ML IJ SOLN
INTRAMUSCULAR | Status: DC | PRN
Start: 2022-02-02 — End: 2022-02-02
  Administered 2022-02-02 (×3): 50 ug via INTRAVENOUS
  Administered 2022-02-02: 100 ug via INTRAVENOUS

## 2022-02-02 MED ORDER — OXYCODONE-ACETAMINOPHEN 5-325 MG PO TABS
1.0000 | ORAL_TABLET | ORAL | 0 refills | Status: AC | PRN
  Filled 2022-02-02: qty 60, 5d supply, fill #0

## 2022-02-02 MED ORDER — ACETAMINOPHEN 500 MG PO TABS
ORAL_TABLET | ORAL | Status: AC
  Administered 2022-02-02: 1000 mg via ORAL
  Filled 2022-02-02: qty 2

## 2022-02-02 MED ORDER — BUPIVACAINE-EPINEPHRINE 0.25% -1:200000 IJ SOLN
INTRAMUSCULAR | Status: DC | PRN
  Administered 2022-02-02: 6 mL

## 2022-02-02 MED ORDER — POVIDONE-IODINE 7.5 % EX SOLN
Freq: Once | CUTANEOUS | Status: DC
  Filled 2022-02-02: qty 118

## 2022-02-02 MED ORDER — CEFAZOLIN SODIUM-DEXTROSE 2-4 GM/100ML-% IV SOLN: 2.0000 g | INTRAVENOUS | Status: DC

## 2022-02-02 MED ORDER — BUPIVACAINE-EPINEPHRINE (PF) 0.25% -1:200000 IJ SOLN
INTRAMUSCULAR | Status: AC
  Filled 2022-02-02: qty 30

## 2022-02-02 MED ORDER — DEXAMETHASONE SODIUM PHOSPHATE 10 MG/ML IJ SOLN
INTRAMUSCULAR | Status: AC
  Filled 2022-02-02: qty 1

## 2022-02-02 MED ORDER — DIPHENHYDRAMINE HCL 50 MG/ML IJ SOLN
INTRAMUSCULAR | Status: AC
Start: 2022-02-02 — End: ?
  Filled 2022-02-02: qty 1

## 2022-02-02 MED ORDER — PROPOFOL 500 MG/50ML IV EMUL
INTRAVENOUS | Status: DC | PRN
  Administered 2022-02-02: 150 ug/kg/min via INTRAVENOUS
  Administered 2022-02-02: 125 ug/kg/min via INTRAVENOUS

## 2022-02-02 MED ORDER — METHOCARBAMOL 750 MG PO TABS
750.0000 mg | ORAL_TABLET | Freq: Four times a day (QID) | ORAL | 0 refills | Status: DC | PRN
  Filled 2022-02-02: qty 60, 15d supply, fill #0

## 2022-02-02 MED ORDER — LACTATED RINGERS IV SOLN: INTRAVENOUS | Status: DC

## 2022-02-02 SURGICAL SUPPLY — 78 items
BAG COUNTER SPONGE SURGICOUNT (BAG) ×2 IMPLANT
BENZOIN TINCTURE PRP APPL 2/3 (GAUZE/BANDAGES/DRESSINGS) ×2 IMPLANT
BIT DRILL NEURO 2X3.1 SFT TUCH (MISCELLANEOUS) ×1 IMPLANT
BIT DRILL SRG 14X2.2XFLT CHK (BIT) IMPLANT
BIT DRL SRG 14X2.2XFLT CHK (BIT) ×1
BLADE CLIPPER SURG (BLADE) ×2 IMPLANT
BLADE SURG 15 STRL LF DISP TIS (BLADE) ×1 IMPLANT
BLADE SURG 15 STRL SS (BLADE) ×1
BONE VIVIGEN FORMABLE 1.3CC (Bone Implant) ×4 IMPLANT
CARTRIDGE OIL MAESTRO DRILL (MISCELLANEOUS) ×1 IMPLANT
CLSR STERI-STRIP ANTIMIC 1/2X4 (GAUZE/BANDAGES/DRESSINGS) ×1 IMPLANT
COLLAR CERV LO CONTOUR FIRM DE (SOFTGOODS) IMPLANT
CORD BIPOLAR FORCEPS 12FT (ELECTRODE) ×2 IMPLANT
COVER SURGICAL LIGHT HANDLE (MISCELLANEOUS) ×2 IMPLANT
DIFFUSER DRILL AIR PNEUMATIC (MISCELLANEOUS) ×2 IMPLANT
DRAIN JACKSON RD 7FR 3/32 (WOUND CARE) IMPLANT
DRAPE C-ARM 42X72 X-RAY (DRAPES) ×2 IMPLANT
DRAPE POUCH INSTRU U-SHP 10X18 (DRAPES) ×2 IMPLANT
DRAPE SURG 17X23 STRL (DRAPES) ×8 IMPLANT
DRILL BIT SKYLINE 14MM (BIT) ×1
DRILL NEURO 2X3.1 SOFT TOUCH (MISCELLANEOUS) ×2
DURAPREP 26ML APPLICATOR (WOUND CARE) ×2 IMPLANT
ELECT COATED BLADE 2.86 ST (ELECTRODE) ×2 IMPLANT
ELECT REM PT RETURN 9FT ADLT (ELECTROSURGICAL) ×2
ELECTRODE REM PT RTRN 9FT ADLT (ELECTROSURGICAL) ×1 IMPLANT
EVACUATOR SILICONE 100CC (DRAIN) IMPLANT
GAUZE 4X4 16PLY ~~LOC~~+RFID DBL (SPONGE) ×2 IMPLANT
GAUZE SPONGE 4X4 12PLY STRL (GAUZE/BANDAGES/DRESSINGS) ×2 IMPLANT
GLOVE BIO SURGEON STRL SZ7 (GLOVE) ×2 IMPLANT
GLOVE BIO SURGEON STRL SZ8 (GLOVE) ×2 IMPLANT
GLOVE BIOGEL PI IND STRL 7.0 (GLOVE) ×2 IMPLANT
GLOVE BIOGEL PI IND STRL 8 (GLOVE) ×1 IMPLANT
GLOVE BIOGEL PI INDICATOR 7.0 (GLOVE) ×2
GLOVE BIOGEL PI INDICATOR 8 (GLOVE) ×1
GLOVE SURG ENC MOIS LTX SZ6.5 (GLOVE) ×2 IMPLANT
GOWN STRL REUS W/ TWL LRG LVL3 (GOWN DISPOSABLE) ×1 IMPLANT
GOWN STRL REUS W/ TWL XL LVL3 (GOWN DISPOSABLE) ×1 IMPLANT
GOWN STRL REUS W/TWL LRG LVL3 (GOWN DISPOSABLE) ×1
GOWN STRL REUS W/TWL XL LVL3 (GOWN DISPOSABLE) ×1
GRAFT BNE MATRIX VG FRMBL SM 1 (Bone Implant) IMPLANT
INTERLOCK LRDTC CRVCL VBR 6MM (Bone Implant) IMPLANT
IV CATH 14GX2 1/4 (CATHETERS) ×2 IMPLANT
KIT BASIN OR (CUSTOM PROCEDURE TRAY) ×2 IMPLANT
KIT TURNOVER KIT B (KITS) ×2 IMPLANT
LORDOTIC CERVICAL VBR 6MM SM (Bone Implant) ×4 IMPLANT
MANIFOLD NEPTUNE II (INSTRUMENTS) ×2 IMPLANT
NDL PRECISIONGLIDE 27X1.5 (NEEDLE) ×1 IMPLANT
NDL SPNL 20GX3.5 QUINCKE YW (NEEDLE) ×1 IMPLANT
NEEDLE PRECISIONGLIDE 27X1.5 (NEEDLE) ×2 IMPLANT
NEEDLE SPNL 20GX3.5 QUINCKE YW (NEEDLE) ×2 IMPLANT
NS IRRIG 1000ML POUR BTL (IV SOLUTION) ×2 IMPLANT
OIL CARTRIDGE MAESTRO DRILL (MISCELLANEOUS) ×2
PACK ORTHO CERVICAL (CUSTOM PROCEDURE TRAY) ×2 IMPLANT
PAD ARMBOARD 7.5X6 YLW CONV (MISCELLANEOUS) ×4 IMPLANT
PATTIES SURGICAL .5 X.5 (GAUZE/BANDAGES/DRESSINGS) IMPLANT
PATTIES SURGICAL .5 X1 (DISPOSABLE) ×2 IMPLANT
PIN DISTRACTION 14 (PIN) ×2 IMPLANT
PLATE SKYLINE TWO LEVEL 28MM (Plate) ×1 IMPLANT
POSITIONER HEAD DONUT 9IN (MISCELLANEOUS) ×2 IMPLANT
SCREW SKYLINE VAR OS 14MM (Screw) ×6 IMPLANT
SPIKE FLUID TRANSFER (MISCELLANEOUS) ×2 IMPLANT
SPONGE INTESTINAL PEANUT (DISPOSABLE) ×2 IMPLANT
SPONGE SURGIFOAM ABS GEL 100 (HEMOSTASIS) ×2 IMPLANT
STRIP CLOSURE SKIN 1/2X4 (GAUZE/BANDAGES/DRESSINGS) ×2 IMPLANT
SURGIFLO W/THROMBIN 8M KIT (HEMOSTASIS) IMPLANT
SUT MNCRL AB 4-0 PS2 18 (SUTURE) ×2 IMPLANT
SUT SILK 4 0 (SUTURE)
SUT SILK 4-0 18XBRD TIE 12 (SUTURE) IMPLANT
SUT VIC AB 2-0 CT2 18 VCP726D (SUTURE) ×2 IMPLANT
SYR BULB IRRIG 60ML STRL (SYRINGE) ×2 IMPLANT
SYR CONTROL 10ML LL (SYRINGE) ×6 IMPLANT
TAPE CLOTH 4X10 WHT NS (GAUZE/BANDAGES/DRESSINGS) ×2 IMPLANT
TAPE CLOTH SURG 4X10 WHT LF (GAUZE/BANDAGES/DRESSINGS) ×1 IMPLANT
TAPE UMBILICAL COTTON 1/8X30 (MISCELLANEOUS) ×2 IMPLANT
TOWEL GREEN STERILE (TOWEL DISPOSABLE) ×2 IMPLANT
TOWEL GREEN STERILE FF (TOWEL DISPOSABLE) ×2 IMPLANT
WATER STERILE IRR 1000ML POUR (IV SOLUTION) ×2 IMPLANT
YANKAUER SUCT BULB TIP NO VENT (SUCTIONS) ×2 IMPLANT

## 2022-02-02 NOTE — Anesthesia Procedure Notes (Addendum)
Procedure Name: Intubation Date/Time: 02/02/2022 3:39 PM  Performed by: Rande Brunt, CRNAPre-anesthesia Checklist: Patient identified, Emergency Drugs available, Suction available and Patient being monitored Patient Re-evaluated:Patient Re-evaluated prior to induction Oxygen Delivery Method: Circle System Utilized Preoxygenation: Pre-oxygenation with 100% oxygen Induction Type: IV induction Ventilation: Mask ventilation without difficulty Laryngoscope Size: Glidescope and 3 Grade View: Grade I Tube type: Oral Tube size: 7.0 mm Number of attempts: 1 Airway Equipment and Method: Stylet Placement Confirmation: ETT inserted through vocal cords under direct vision, positive ETCO2 and breath sounds checked- equal and bilateral Secured at: 21 cm Tube secured with: Tape Dental Injury: Teeth and Oropharynx as per pre-operative assessment  Comments: ETT placed by Tenna Child, supervised by Junie Bame CRNA

## 2022-02-02 NOTE — Op Note (Signed)
PATIENT NAME: Melanie [name] RECORD NO.:   [id number]    DATE OF BIRTH: 1966/05/29   DATE OF PROCEDURE: 02/02/2022                               OPERATIVE REPORT     PREOPERATIVE DIAGNOSES: 1. Right-sided cervical radiculopathy. 2. Spinal stenosis spanning C5-C7.   POSTOPERATIVE DIAGNOSES: 1. Right-sided cervical radiculopathy. 2. Spinal stenosis spanning C5-C7.   PROCEDURE: 1. Anterior cervical decompression and fusion C5/6, C6/7 2. Placement of anterior instrumentation, C5-C7 3. Insertion of interbody device x2 (Titan intervertebral spacers). 4. Intraoperative use of fluoroscopy. 5. Use of morselized allograft - ViviGen.   SURGEON:  Phylliss Bob, MD   ASSISTANT:  Pricilla Holm, PA-C.   ANESTHESIA:  General endotracheal anesthesia.   COMPLICATIONS:  None.   DISPOSITION:  Stable.   ESTIMATED BLOOD LOSS:  Minimal.   INDICATIONS FOR SURGERY:  Briefly, Melanie [name] is a pleasant 56 y.o. -year- old female, who did present to me with severe pain in her neck and right arm.  The patient's MRI did reveal the findings noted above.  Given the patient's ongoing rather debilitating pain and lack of improvement with appropriate treatment measures, we did discuss proceeding with the procedure noted above.  The patient was fully aware of the risks and limitations of surgery as outlined in my preoperative note.   OPERATIVE DETAILS:  On 02/02/2022 the patient was brought to surgery and general endotracheal anesthesia was administered.  The patient was placed supine on the hospital bed. The neck was gently extended.  All bony prominences were meticulously padded.  The neck was prepped and draped in the usual sterile fashion.  At this point, I did make a left-sided transverse incision.  The platysma was incised.  A Smith-Robinson approach was used and the anterior spine was identified. A self-retaining retractor was placed.  I then subperiosteally exposed the vertebral  bodies from C5-C7.  Caspar pins were then placed into the C6 and C7 vertebral bodies and distraction was applied.  A thorough and complete C6/7 intervertebral diskectomy was performed.  The posterior longitudinal ligament was identified and entered using a nerve hook.  I then used #1 followed by #2 Kerrison to perform a thorough and complete intervertebral diskectomy.  The spinal canal was thoroughly decompressed, as was the right neuroforamen.  The endplates were then prepared and the appropriate-sized intervertebral spacer was then packed with ViviGen and tamped into position in the usual fashion.  The lower Caspar pin was then removed and placed into the C5 vertebral body and once again, distraction was applied across the C5/6 intervertebral space.  I then again performed a thorough and complete diskectomy, thoroughly decompressing the spinal canal and right neuroforamen.  After preparing the endplates, the appropriate-sized intervertebral spacer was packed with ViviGen and tamped into position.  The Caspar pins then were removed and bone wax was placed in their place.  The appropriate-sized anterior cervical plate was placed over the anterior spine.  14 mm variable angle screws were placed, 2 in each vertebral body from C5-C7 for a total of 6 vertebral body screws.  The screws were then locked to the plate using the Cam locking mechanism.  I was very pleased with the final fluoroscopic images.  The wound was then irrigated.  The wound was then explored for any undue bleeding and there was no bleeding noted. The wound was then closed in  layers using 2-0 Vicryl, followed by 4-0 Monocryl.  Benzoin and Steri-Strips were applied, followed by sterile dressing.  All instrument counts were correct at the termination of the procedure.   Of note, Pricilla Holm, PA-C, was my assistant throughout surgery, and did aid in retraction, suctioning, placement of the hardware, and closure from start to  finish.     Phylliss Bob, MD

## 2022-02-02 NOTE — Transfer of Care (Signed)
Immediate Anesthesia Transfer of Care Note  Patient: Melanie [name]  Procedure(s) Performed: ANTERIOR CERVICAL DECOMPRESSION AND FUSION CERVICAL 5- CERVICAL 6, CERVICAL 6- CERVICAL 7 WITH INSTRUMENTATION AND ALLOGRAFT  Patient Location: PACU  Anesthesia Type:General  Level of Consciousness: awake, alert  and oriented  Airway & Oxygen Therapy: Patient Spontanous Breathing  Post-op Assessment: Report given to RN, Post -op Vital signs reviewed and stable and Patient moving all extremities X 4  Post vital signs: Reviewed and stable  Last Vitals:  Vitals Value Taken Time  BP 133/81 02/02/22 1745  Temp    Pulse 69 02/02/22 1746  Resp 14 02/02/22 1746  SpO2 91 % 02/02/22 1746  Vitals shown include unvalidated device data.  Last Pain:  Vitals:   02/02/22 1347  TempSrc: Oral         Complications: No notable events documented.

## 2022-02-02 NOTE — Anesthesia Preprocedure Evaluation (Addendum)
Anesthesia Evaluation  Patient identified by MRN, date of birth, ID band Patient awake    Reviewed: Allergy & Precautions, H&P , NPO status , Patient's Chart, lab work & pertinent test results  History of Anesthesia Complications (+) PONV and history of anesthetic complications  Airway Mallampati: II  TM Distance: >3 FB Neck ROM: Full    Dental no notable dental hx. (+) Teeth Intact, Dental Advisory Given, Caps,    Pulmonary sleep apnea and Continuous Positive Airway Pressure Ventilation ,    Pulmonary exam normal breath sounds clear to auscultation       Cardiovascular hypertension, Pt. on medications Normal cardiovascular exam Rhythm:Regular Rate:Normal     Neuro/Psych PSYCHIATRIC DISORDERS Anxiety Depression    GI/Hepatic negative GI ROS, Neg liver ROS,   Endo/Other  diabetes, Type 2, Oral Hypoglycemic AgentsHypothyroidism Obesity   Renal/GU negative Renal ROS  negative genitourinary   Musculoskeletal  (+) Arthritis , Osteoarthritis,  Fibromyalgia -  Abdominal   Peds  Hematology negative hematology ROS (+)   Anesthesia Other Findings   Reproductive/Obstetrics negative OB ROS                           Anesthesia Physical Anesthesia Plan  ASA: 3  Anesthesia Plan: General   Post-op Pain Management: Tylenol PO (pre-op)*   Induction: Intravenous  PONV Risk Score and Plan: 4 or greater and Ondansetron, Dexamethasone, Midazolam, Scopolamine patch - Pre-op and TIVA  Airway Management Planned: Oral ETT and Video Laryngoscope Planned  Additional Equipment:   Intra-op Plan:   Post-operative Plan: Extubation in OR  Informed Consent: I have reviewed the patients History and Physical, chart, labs and discussed the procedure including the risks, benefits and alternatives for the proposed anesthesia with the patient or authorized representative who has indicated his/her understanding and  acceptance.     Dental advisory given  Plan Discussed with: CRNA  Anesthesia Plan Comments:        Anesthesia Quick Evaluation

## 2022-02-02 NOTE — H&P (Signed)
PREOPERATIVE H&P  Chief Complaint: Right arm pain  HPI: Melanie [name] is a 56 y.o. female who presents with ongoing pain in the right arm  MRI reveals stenosis at C5/6 and C6/7  Patient has failed multiple forms of conservative care and continues to have pain (see office notes for additional details regarding the patient's full course of treatment)  Past Medical History:  Diagnosis Date   ADHD (attention deficit hyperactivity disorder)    Arthritis    Bowel obstruction ([place])    Constipation 07/01/2020   Diabetes mellitus without complication ([place])    Diverticulosis    Fibromyalgia    H/O: hysterectomy 02/27/2018   History of prediabetes 02/27/2018   Hypertension    Post-operative nausea and vomiting    SBO (small bowel obstruction) ([place]) 04/29/2020   Sleep apnea    uses CPAP   Past Surgical History:  Procedure Laterality Date   ABDOMINAL HYSTERECTOMY     COLONOSCOPY  20 years ago    in [facility]=diverticulosis    KNEE ARTHROSCOPY WITH EXCISION PLICA Right 12/29/3660   Procedure: RIGHT KNEE ARTHROSCOPY WITH EXCISION PLICA;  Surgeon: Vanetta Mulders, MD;  Location: [facility];  Service: Orthopedics;  Laterality: Right;   LAPAROSCOPY     due to bowel perforation   MYOMECTOMY     OOPHORECTOMY     unilateral   Social History   Socioeconomic History   Marital status: Divorced    Spouse name: Not on file   Number of children: Not on file   Years of education: Not on file   Highest education level: Not on file  Occupational History   Not on file  Tobacco Use   Smoking status: Never   Smokeless tobacco: Never  Vaping Use   Vaping Use: Never used  Substance and Sexual Activity   Alcohol use: Yes    Comment: Occassionally   Drug use: Never   Sexual activity: Not Currently    Birth control/protection: Surgical  Other Topics Concern   Not on file  Social History Narrative   Not on file   Social Determinants of Health   Financial  Resource Strain: Not on file  Food Insecurity: Not on file  Transportation Needs: Not on file  Physical Activity: Not on file  Stress: Not on file  Social Connections: Not on file   Family History  Problem Relation Age of Onset   Lung cancer Father    Diabetes Mother    Skin cancer Mother    Colon cancer Neg Hx    Colon polyps Neg Hx    Esophageal cancer Neg Hx    Rectal cancer Neg Hx    Stomach cancer Neg Hx    Allergies  Allergen Reactions   Morphine Other (See Comments)    mental status alerted   Sulfa Antibiotics Hives   Clarithromycin Rash and Other (See Comments)    Other Reaction: Fever   Prior to Admission medications   Medication Sig Start Date End Date Taking? Authorizing Provider  acetaminophen (TYLENOL) 500 MG tablet Take 500 mg by mouth every 12 (twelve) hours as needed (pain).   Yes [provider]  ipratropium (ATROVENT) 0.03 % nasal spray PLACE 2 SPRAYS INTO BOTH NOSTRILS TWO TIMES DAILY Patient taking differently: Place 2 sprays into both nostrils See admin instructions. Instill 2 sprays into each nostril ever morning, may use again the evening as needed for congestion 11/08/21 11/08/22 Yes Samuel Bouche, NP  levocetirizine (XYZAL) 5  MG tablet TAKE 1 TABLET BY MOUTH EVERY EVENING Patient taking differently: Take 5 mg by mouth every evening. 08/06/21 08/06/22 Yes Samuel Bouche, NP  lisdexamfetamine (VYVANSE) 60 MG capsule Take 1 capsule by mouth daily. Patient taking differently: Take 60 mg by mouth every morning. 01/07/22  Yes Samuel Bouche, NP  losartan (COZAAR) 50 MG tablet Take 1 tablet (50 mg total) by mouth daily. Patient taking differently: Take 50 mg by mouth every evening. 11/08/21  Yes Samuel Bouche, NP  oxyCODONE (OXY IR/ROXICODONE) 5 MG immediate release tablet Take 1 tablet (5 mg total) by mouth every 4 (four) hours as needed (severe pain). 12/31/21  Yes Vanetta Mulders, MD  tiZANidine (ZANAFLEX) 4 MG tablet Take 1 tablet (4 mg total) by mouth every 8 (eight)  hours as needed for muscle spasms. 04/02/21  Yes Gregor Hams, MD  AMBULATORY NON FORMULARY MEDICATION Continuous positive airway pressure (CPAP) machine set at Autopap (8-41m of water pressure), with all supplemental supplies as needed. 11/08/21   JSamuel Bouche NP  aspirin EC 325 MG tablet Take 1 tablet (325 mg total) by mouth daily. Patient not taking: Reported on 01/28/2022 12/31/21   BVanetta Mulders MD  blood glucose meter kit and supplies KIT Dispense based on patient and insurance preference. Use up to four times daily as directed. Please include lancets, test strips, control solution. 04/24/20   JSamuel Bouche NP  celecoxib (CELEBREX) 200 MG capsule TAKE 1 TO 2 CAPSULES BY MOUTH DAILY AS NEEDED FOR PAIN Patient not taking: Reported on 01/28/2022 08/06/21 08/06/22  JSamuel Bouche NP  lisdexamfetamine (VYVANSE) 60 MG capsule Take 1 capsule by mouth every morning. (09/09/21) Patient not taking: Reported on 01/28/2022 12/08/21   JSamuel Bouche NP  Naltrexone POWD 1.5 mg by mouth at night for 14 days then increasing to 3 mg by mouth at night. Dispense quantity sufficient for 90 days Patient not taking: Reported on 01/28/2022 08/20/21   CGregor Hams MD  ondansetron (ZOFRAN-ODT) 8 MG disintegrating tablet Dissolve 1 tablet by mouth every 8 hours as needed for nausea. 01/31/22   JSamuel Bouche NP  OVER THE COUNTER MEDICATION Take 4 tablets by mouth every morning. Multivitamin with fish oil - gummies Patient not taking: Reported on 01/28/2022    [provider]  tirzepatide (Darcel Bayley 5 MG/0.5ML Pen Inject 5 mg into the skin once a week. Patient not taking: Reported on 01/28/2022 11/08/21   JSamuel Bouche NP     All other systems have been reviewed and were otherwise negative with the exception of those mentioned in the HPI and as above.  Physical Exam: There were no vitals filed for this visit.  There is no height or weight on file to calculate BMI.  General: Alert, no acute distress Cardiovascular: No  pedal edema Respiratory: No cyanosis, no use of accessory musculature Skin: No lesions in the area of chief complaint Neurologic: Sensation intact distally Psychiatric: Patient is competent for consent with normal mood and affect Lymphatic: No axillary or cervical lymphadenopathy   Assessment/Plan: Chronic right-sided cervical radiculopathy Plan for Procedure(s): ANTERIOR CERVICAL DECOMPRESSION AND FUSION CERVICAL 5- CERVICAL 6, CERVICAL 6- CERVICAL 7 WITH INSTRUMENTATION AND ALLOGRAFT   MNorva Karvonen MD 02/02/2022 8:38 AM

## 2022-02-03 ENCOUNTER — Other Ambulatory Visit: Payer: Self-pay

## 2022-02-03 NOTE — Anesthesia Postprocedure Evaluation (Signed)
Anesthesia Post Note  Patient: Melanie [name]  Procedure(s) Performed: ANTERIOR CERVICAL DECOMPRESSION AND FUSION CERVICAL 5- CERVICAL 6, CERVICAL 6- CERVICAL 7 WITH INSTRUMENTATION AND ALLOGRAFT     Patient location during evaluation: Other Anesthesia Type: General Level of consciousness: awake and alert Pain management: pain level controlled Vital Signs Assessment: post-procedure vital signs reviewed and stable Respiratory status: spontaneous breathing, nonlabored ventilation and respiratory function stable Cardiovascular status: blood pressure returned to baseline and stable Postop Assessment: no apparent nausea or vomiting Anesthetic complications: no   No notable events documented.  Last Vitals:  Vitals:   02/02/22 1815 02/02/22 1830  BP: 116/68 109/77  Pulse: 70 72  Resp: 16 19  Temp:  36.8 C  SpO2: 95% 99%    Last Pain:  Vitals:   02/02/22 1830  TempSrc:   PainSc: 3                  Jazzmyne Rasnick,W. EDMOND

## 2022-02-08 ENCOUNTER — Encounter (HOSPITAL_COMMUNITY): Payer: Self-pay | Admitting: Orthopedic Surgery

## 2022-02-25 ENCOUNTER — Encounter (HOSPITAL_BASED_OUTPATIENT_CLINIC_OR_DEPARTMENT_OTHER): Payer: Self-pay

## 2022-02-28 ENCOUNTER — Ambulatory Visit (INDEPENDENT_AMBULATORY_CARE_PROVIDER_SITE_OTHER): Payer: No Typology Code available for payment source | Admitting: Orthopaedic Surgery

## 2022-02-28 DIAGNOSIS — M6520 Calcific tendinitis, unspecified site: Secondary | ICD-10-CM

## 2022-02-28 DIAGNOSIS — M25511 Pain in right shoulder: Secondary | ICD-10-CM

## 2022-02-28 DIAGNOSIS — G8929 Other chronic pain: Secondary | ICD-10-CM

## 2022-02-28 MED ORDER — LIDOCAINE HCL 1 % IJ SOLN
4.0000 mL | INTRAMUSCULAR | Status: AC | PRN
  Administered 2022-02-28: 4 mL

## 2022-02-28 MED ORDER — TRIAMCINOLONE ACETONIDE 40 MG/ML IJ SUSP
80.0000 mg | INTRAMUSCULAR | Status: AC | PRN
  Administered 2022-02-28: 80 mg via INTRA_ARTICULAR

## 2022-02-28 NOTE — Progress Notes (Signed)
Post Operative Evaluation    Procedure/Date of Surgery: Right knee medial meniscal debridement and plica excision 9/41  Interval History:    Presents today for follow-up status post right knee plica excision.  Overall she is doing well from the standpoint she is experiencing some swelling in the knee although this is overall minor.  She is quite frustrated as she has experienced recurrent shoulder plan status post previous calcific tendinitis injection.  She states that this has gotten more painful following a recent ACDF procedure.  PMH/PSH/Family History/Social History/Meds/Allergies:    Past Medical History:  Diagnosis Date  . ADHD (attention deficit hyperactivity disorder)   . Arthritis   . Bowel obstruction ([place])   . Constipation 07/01/2020  . Diabetes mellitus without complication ([place])   . Diverticulosis   . Fibromyalgia   . H/O: hysterectomy 02/27/2018  . History of prediabetes 02/27/2018  . Hypertension   . Post-operative nausea and vomiting   . SBO (small bowel obstruction) ([place]) 04/29/2020  . Sleep apnea    uses CPAP   Past Surgical History:  Procedure Laterality Date  . ABDOMINAL HYSTERECTOMY    . ANTERIOR CERVICAL DECOMP/DISCECTOMY FUSION N/A 02/02/2022   Procedure: ANTERIOR CERVICAL DECOMPRESSION AND FUSION CERVICAL 5- CERVICAL 6, CERVICAL 6- CERVICAL 7 WITH INSTRUMENTATION AND ALLOGRAFT;  Surgeon: Phylliss Bob, MD;  Location: [place];  Service: Orthopedics;  Laterality: N/A;  . COLONOSCOPY  20 years ago    in [place]=diverticulosis   . KNEE ARTHROSCOPY WITH EXCISION PLICA Right 7/40/8144   Procedure: RIGHT KNEE ARTHROSCOPY WITH EXCISION PLICA;  Surgeon: Vanetta Mulders, MD;  Location: [place];  Service: Orthopedics;  Laterality: Right;  . LAPAROSCOPY     due to bowel perforation  . MYOMECTOMY    . OOPHORECTOMY     unilateral   Social History   Socioeconomic History  . Marital status: Divorced    Spouse  name: Not on file  . Number of children: Not on file  . Years of education: Not on file  . Highest education level: Not on file  Occupational History  . Not on file  Tobacco Use  . Smoking status: Never  . Smokeless tobacco: Never  Vaping Use  . Vaping Use: Never used  Substance and Sexual Activity  . Alcohol use: Yes    Comment: Occassionally  . Drug use: Never  . Sexual activity: Not Currently    Birth control/protection: Surgical  Other Topics Concern  . Not on file  Social History Narrative  . Not on file   Social Determinants of Health   Financial Resource Strain: Not on file  Food Insecurity: Not on file  Transportation Needs: Not on file  Physical Activity: Not on file  Stress: Not on file  Social Connections: Not on file   Family History  Problem Relation Age of Onset  . Lung cancer Father   . Diabetes Mother   . Skin cancer Mother   . Colon cancer Neg Hx   . Colon polyps Neg Hx   . Esophageal cancer Neg Hx   . Rectal cancer Neg Hx   . Stomach cancer Neg Hx    Allergies  Allergen Reactions  . Morphine Other (See Comments)    mental status alerted  . Sulfa Antibiotics Hives  . Clarithromycin Rash and Other (See  Comments)    Other Reaction: Fever   Current Outpatient Medications  Medication Sig Dispense Refill  . acetaminophen (TYLENOL) 500 MG tablet Take 500 mg by mouth every 12 (twelve) hours as needed (pain).    . AMBULATORY NON FORMULARY MEDICATION Continuous positive airway pressure (CPAP) machine set at Autopap (8-53m of water pressure), with all supplemental supplies as needed. 1 each 0  . blood glucose meter kit and supplies KIT Dispense based on patient and insurance preference. Use up to four times daily as directed. Please include lancets, test strips, control solution. 1 each 0  . ipratropium (ATROVENT) 0.03 % nasal spray PLACE 2 SPRAYS INTO BOTH NOSTRILS TWO TIMES DAILY (Patient taking differently: Place 2 sprays into both nostrils See admin  instructions. Instill 2 sprays into each nostril ever morning, may use again the evening as needed for congestion) 30 mL 2  . levocetirizine (XYZAL) 5 MG tablet TAKE 1 TABLET BY MOUTH EVERY EVENING (Patient taking differently: Take 5 mg by mouth every evening.) 90 tablet 1  . lisdexamfetamine (VYVANSE) 60 MG capsule Take 1 capsule by mouth daily. (Patient taking differently: Take 60 mg by mouth every morning.) 30 capsule 0  . losartan (COZAAR) 50 MG tablet Take 1 tablet (50 mg total) by mouth daily. (Patient taking differently: Take 50 mg by mouth every evening.) 90 tablet 1  . methocarbamol (ROBAXIN) 750 MG tablet Take 1 tablet (750 mg total) by mouth every 6 (six) hours as needed for muscle spasms. 60 tablet 0  . Naltrexone POWD 1.5 mg by mouth at night for 14 days then increasing to 3 mg by mouth at night. Dispense quantity sufficient for 90 days 250 g 3  . ondansetron (ZOFRAN-ODT) 8 MG disintegrating tablet Dissolve 1 tablet by mouth every 8 hours as needed for nausea. 20 tablet 3  . OVER THE COUNTER MEDICATION Take 4 tablets by mouth every morning. Multivitamin with fish oil - gummies    . tirzepatide ([facility] 5 MG/0.5ML Pen Inject 5 mg into the skin once a week. 6 mL 0   No current facility-administered medications for this visit.   No results found.  Review of Systems:   A ROS was performed including pertinent positives and negatives as documented in the HPI.   Musculoskeletal Exam:    There were no vitals taken for this visit.  Right knee portals are healed.  Range of motion is from -2 to 130 degrees without pain.  No joint line tenderness.  Very mild swelling.  Negative Lachman.  Negative McMurray bilaterally  Right shoulder with tenderness about the greater tuberosity and positive Neer impingement test.  Full active range of motion bilaterally to 170 degrees with external rotation at side to 45 degrees and internal rotation to L1 bilaterally.  Imaging:    None  I personally  reviewed and interpreted the radiographs.   Assessment:   56year old female who is status post right knee plica excision overall doing very well.  At this time I will see her back as needed from this standpoint.  Given the fact that she has had recurrent shoulder pain I do believe that she may benefit from an additional injection into the area of calcific tendinitis on the right shoulder.  I will plan to proceed with this.  We will keep a close eye on this in the future.  Plan :    -Plan for right shoulder ultrasound-guided injection    Procedure Note  Patient: Melanie [name]  Date of Birth: 03/29/1966           MRN: [id number]             Visit Date: 02/28/2022  Procedures: Visit Diagnoses: No diagnosis found.  Large Joint Inj: R subacromial bursa on 02/28/2022 12:53 PM Indications: pain Details: 22 G 1.5 in needle, ultrasound-guided anterior approach  Arthrogram: No  Medications: 4 mL lidocaine 1 %; 80 mg triamcinolone acetonide 40 MG/ML Outcome: tolerated well, no immediate complications Procedure, treatment alternatives, risks and benefits explained, specific risks discussed. Consent was given by the patient. Immediately prior to procedure a time out was called to verify the correct patient, procedure, equipment, support staff and site/side marked as required. Patient was prepped and draped in the usual sterile fashion.           I personally saw and evaluated the patient, and participated in the management and treatment plan.  Vanetta Mulders, MD Attending Physician, Orthopedic Surgery  This document was dictated using Dragon voice recognition software. A reasonable attempt at proof reading has been made to minimize errors.

## 2022-03-02 NOTE — Progress Notes (Signed)
Established Patient Office Visit  Subjective   Patient ID: Melanie [name], female   DOB: April 04, 1966 Age: 56 y.o. MRN: [id number]   Chief Complaint  Patient presents with   Diabetes   Follow-up    HPI Pleasant 56 year old female presenting today for the following:  DM: Hemoglobin A1c checked 1 month ago with result of 5.7%. Not checking sugars regularly but when she does, it's been good at 120 or less. Taking Mounjaro but not regularly with all the recent surgeries. Has had a reduced appetite. Some nausea and intermittent diarrhea. Uses prn Zofran with good relief of nausea. Is quite worried about the issues with bowel effects in relation to her known GI issues.  HTN: taking losartan 19m daily, tolerating well without side effects. Not regularly checking BP. Gradually increasing activity level since her surgeries. Denies CP, SOB, palpitations, lower extremity edema, dizziness, headaches, or vision changes.  ADHD: Has been taking Vyvanse 676mdaily on an as needed basis since being out for her surgeries and recovery. Has not used it frequently but notes that it does still work well when she takes it. Reports that her discount for brand name Vyvanse is going to expire in December.    Objective:    Vitals:   03/03/22 1031  BP: 117/80  Pulse: 75  Resp: 20  Height: 5' 7"  (1.702 m)  Weight: 209 lb 1.3 oz (94.8 kg)  SpO2: 99%  BMI (Calculated): 32.74    Physical Exam Vitals and nursing note reviewed.  Constitutional:      General: She is not in acute distress.    Appearance: Normal appearance. She is obese. She is not ill-appearing.  HENT:     Head: Normocephalic and atraumatic.  Cardiovascular:     Rate and Rhythm: Normal rate and regular rhythm.     Pulses: Normal pulses.     Heart sounds: Normal heart sounds.  Pulmonary:     Effort: Pulmonary effort is normal. No respiratory distress.     Breath sounds: Normal breath sounds. No wheezing, rhonchi or rales.  Skin:     General: Skin is warm and dry.  Neurological:     Mental Status: She is alert and oriented to person, place, and time.  Psychiatric:        Mood and Affect: Mood normal.        Behavior: Behavior normal.        Thought Content: Thought content normal.        Judgment: Judgment normal.   No results found for this or any previous visit (from the past 24 hour(s)).     The 10-year ASCVD risk score (Arnett DK, et al., 2019) is: 4.3%   Values used to calculate the score:     Age: 89ears     Sex: Female     Is Non-Hispanic African American: No     Diabetic: Yes     Tobacco smoker: No     Systolic Blood Pressure: 11956mHg     Is BP treated: Yes     HDL Cholesterol: 63 mg/dL     Total Cholesterol: 246 mg/dL   Assessment & Plan:   1. Essential hypertension BP at goal. Continue Losartan 5074maily. Recommend regular intentional exercise, low sodium diet, and weight loss to a healthy weight.  - losartan (COZAAR) 50 MG tablet; Take 1 tablet (50 mg total) by mouth every evening.  Dispense: 90 tablet; Refill: 1  2. Type 2 diabetes mellitus without complication,  without long-term current use of insulin (HCC) POCT A1c 5.7% on recent check with pre-op labs. POCT microalbumin normal. Discussed treatment with Mounjaro and the risks of GI issues. She just picked up a supply and feels that she should probably go ahead and use it. Refilling Zofran for prn use. She will let me know if she would like to do something different to control her sugars once she has used the [facility] she has on hand.  - POCT UA - Microalbumin  3. Attention deficit hyperactivity disorder (ADHD), predominantly inattentive type Continue Vyvanse 46m daily as needed. Refill sent. Discussed switch from brand to generic for Vyvanse and that this is likely the reason for the discount expiration. She will let me know if cost becomes an issue once the discount is up so we can discuss other options. - lisdexamfetamine (VYVANSE) 60 MG  capsule; Take 1 capsule (60 mg total) by mouth every morning.  Dispense: 30 capsule; Refill: 0  Return in about 6 months (around 09/01/2022) for DM/HTN/ADHD follow up.  ___________________________________________ JClearnce Sorrel DNP, APRN, FNP-BC [facility] [place]

## 2022-03-03 ENCOUNTER — Encounter: Payer: Self-pay | Admitting: Medical-Surgical

## 2022-03-03 ENCOUNTER — Other Ambulatory Visit (HOSPITAL_COMMUNITY): Payer: Self-pay

## 2022-03-03 ENCOUNTER — Ambulatory Visit (INDEPENDENT_AMBULATORY_CARE_PROVIDER_SITE_OTHER): Payer: No Typology Code available for payment source | Admitting: Medical-Surgical

## 2022-03-03 VITALS — BP 117/80 | HR 75 | Resp 20 | Ht 67.0 in | Wt 209.1 lb

## 2022-03-03 DIAGNOSIS — E119 Type 2 diabetes mellitus without complications: Secondary | ICD-10-CM | POA: Diagnosis not present

## 2022-03-03 DIAGNOSIS — F900 Attention-deficit hyperactivity disorder, predominantly inattentive type: Secondary | ICD-10-CM

## 2022-03-03 DIAGNOSIS — I10 Essential (primary) hypertension: Secondary | ICD-10-CM | POA: Diagnosis not present

## 2022-03-03 LAB — POCT UA - MICROALBUMIN
Albumin/Creatinine Ratio, Urine, POC: <30
Creatinine, POC: 10 mg/dL
Microalbumin Ur, POC: 10 mg/L

## 2022-03-03 MED ORDER — ONDANSETRON 8 MG PO TBDP
8.0000 mg | ORAL_TABLET | Freq: Three times a day (TID) | ORAL | 3 refills | Status: DC | PRN
  Filled 2022-03-03: qty 20, 7d supply, fill #0

## 2022-03-03 MED ORDER — LISDEXAMFETAMINE DIMESYLATE 60 MG PO CAPS
60.0000 mg | ORAL_CAPSULE | Freq: Every morning | ORAL | 0 refills | Status: DC
  Filled 2022-03-03 – 2022-03-08 (×2): qty 30, 30d supply, fill #0

## 2022-03-03 MED ORDER — LOSARTAN POTASSIUM 50 MG PO TABS
50.0000 mg | ORAL_TABLET | Freq: Every evening | ORAL | 1 refills | Status: DC
  Filled 2022-03-03 – 2022-05-31 (×2): qty 90, 90d supply, fill #0

## 2022-03-03 NOTE — Patient Instructions (Signed)
Estroven Meno/Opositive  See attached for info on prescription options

## 2022-03-08 ENCOUNTER — Other Ambulatory Visit (HOSPITAL_COMMUNITY): Payer: Self-pay

## 2022-03-10 ENCOUNTER — Other Ambulatory Visit (HOSPITAL_COMMUNITY): Payer: Self-pay

## 2022-03-11 ENCOUNTER — Other Ambulatory Visit (HOSPITAL_COMMUNITY): Payer: Self-pay

## 2022-03-14 ENCOUNTER — Other Ambulatory Visit (HOSPITAL_COMMUNITY): Payer: Self-pay

## 2022-03-25 ENCOUNTER — Encounter (HOSPITAL_BASED_OUTPATIENT_CLINIC_OR_DEPARTMENT_OTHER): Payer: Self-pay | Admitting: Orthopaedic Surgery

## 2022-03-25 ENCOUNTER — Other Ambulatory Visit (HOSPITAL_BASED_OUTPATIENT_CLINIC_OR_DEPARTMENT_OTHER): Payer: Self-pay | Admitting: Orthopaedic Surgery

## 2022-03-25 DIAGNOSIS — M6751 Plica syndrome, right knee: Secondary | ICD-10-CM

## 2022-03-25 DIAGNOSIS — M6520 Calcific tendinitis, unspecified site: Secondary | ICD-10-CM

## 2022-03-30 ENCOUNTER — Encounter (HOSPITAL_BASED_OUTPATIENT_CLINIC_OR_DEPARTMENT_OTHER): Payer: Self-pay | Admitting: Orthopaedic Surgery

## 2022-03-31 ENCOUNTER — Encounter: Payer: Self-pay | Admitting: Sports Medicine

## 2022-03-31 ENCOUNTER — Ambulatory Visit (INDEPENDENT_AMBULATORY_CARE_PROVIDER_SITE_OTHER): Payer: No Typology Code available for payment source | Admitting: Sports Medicine

## 2022-03-31 VITALS — BP 134/90 | HR 85 | Ht 67.0 in | Wt 205.0 lb

## 2022-03-31 DIAGNOSIS — M25511 Pain in right shoulder: Secondary | ICD-10-CM

## 2022-03-31 DIAGNOSIS — M2141 Flat foot [pes planus] (acquired), right foot: Secondary | ICD-10-CM | POA: Diagnosis not present

## 2022-03-31 DIAGNOSIS — M2142 Flat foot [pes planus] (acquired), left foot: Secondary | ICD-10-CM

## 2022-03-31 DIAGNOSIS — M6520 Calcific tendinitis, unspecified site: Secondary | ICD-10-CM | POA: Diagnosis not present

## 2022-03-31 DIAGNOSIS — M25561 Pain in right knee: Secondary | ICD-10-CM | POA: Diagnosis not present

## 2022-03-31 DIAGNOSIS — G8929 Other chronic pain: Secondary | ICD-10-CM

## 2022-03-31 NOTE — Progress Notes (Signed)
Melanie [name] - 56 y.o. female MRN [id number]  Date of birth: September 22, 1965  Office Visit Note: Visit Date: 03/31/2022 PCP: Samuel Bouche, NP Referred by: Vanetta Mulders, MD  Subjective: Chief Complaint  Patient presents with   Right Shoulder - Pain   Right Knee - Pain   HPI: Melanie [name] is a pleasant 56 y.o. female who presents today for right shoulder pain with calcific tendinopathy and right knee pain.  Right shoulder -has been a chronic issue for her.  Diagnosis was somewhat clouded that she previously had neck cervical fusion procedure as well.  She has had 2 subacromial joint injections with some relief although never got rid of the pain completely.  Recently saw my partner Dr. Junius Creamer on and he diagnosed her with calcific tendinitis of the shoulder.  He did do an ultrasound-guided injection in attempt to break up the calcification.  She has had extensive therapy such as dry needling, injections, rehab, etc.  Right knee -reports history of intermittent recurrent effusion.  Underwent medial plica excision and partial meniscectomy on the medial compartment on January 18, 2022.  She did have relief of her pain following this, however it started to come back.  She does get swelling that comes and goes, worse when she is up on her feet.  Has tried KT tape.  She is on a low-dose naltrexone as well.  Sometimes will get right ankle pain.  Does note that she has flatfeet.  Pertinent ROS were reviewed with the patient and found to be negative unless otherwise specified above in HPI.   Assessment & Plan: Visit Diagnoses:  1. Calcific tendinitis   2. Chronic pain of right knee   3. Chronic right shoulder pain   4. Pes planus of both feet    Plan: I had a good discussion with Dilyn today regarding her right shoulder, right knee and ankle pain.  We did do a trial of extracorporeal shockwave therapy for her calcific tendinitis.  She will let me know what kind of response she gets  to this in the coming days.  Would like her to keep active I did give her some home exercises which were reviewed by my medical assistant today for the rotator cuff as well as the knee and quadricep exercises.  She does have VMO weakness of the right knee and I think this is contributing to the knee pain.  Also recommended a knee compression sleeve to help control the swelling and effusion of the knee.  We did notice that she has flatfeet as well, she is interested in doing a trial of some sports insoles with modifications and attempt to help her gait, she can follow-up with [place] at any point with me.  She will continue her low-dose naltrexone.  We did discuss holding off on anti-inflammatories as she is still recently out from her cervical procedure.   Follow-up: Return for F/u for flat feet and right shoulder.   Meds & Orders: No orders of the defined types were placed in this encounter.  No orders of the defined types were placed in this encounter.    Procedures: Procedure: ECSWT Indications:  Right shoulder calcific tendinitis   Procedure Details Consent: Risks of procedure as well as the alternatives and risks of each were explained to the patient.  Verbal consent for procedure obtained. Time Out: Verified patient identification, verified procedure, site was marked, verified correct patient position. The area was cleaned with alcohol swab.     The  Right anterolateral rotator cuff and shoulder was targeted for Extracorporeal shockwave therapy.    Preset: Calcific tendinitis Power Level: 120 mJ Frequency: 14 Hz Impulse/cycles: 3000 Head size: Regular   Patient tolerated procedure well without immediate complications.         Clinical History: No specialty comments available.  She reports that she has never smoked. She has never used smokeless tobacco.  Recent Labs    08/10/21 1628 01/28/22 0918  HGBA1C 5.7 5.7*    Objective:   Vital Signs: BP (!) 134/90   Pulse 85   Ht 5'  7" (1.702 m)   Wt 205 lb (93 kg)   BMI 32.11 kg/m   Physical Exam  Gen: Well-appearing, in no acute distress; non-toxic CV: Regular Rate. Well-perfused. Warm.  Resp: Breathing unlabored on room air; no wheezing. Psych: Fluid speech in conversation; appropriate affect; normal thought process Neuro: Sensation intact throughout. No gross coordination deficits.   Ortho Exam - Right shoulder: No specific bony TTP.  There is some relative generalized tenderness to palpation near the proximal deltoid and insertion of the supra and infraspinatus.  There is a painful arc and pain with active forward flexion of 170 degrees.  Strength intact.  - Right knee: Is a small knee effusion without redness or erythema.  Range of motion 0-130 degrees.  Positive TTP over the Pez anserine bursa.  No joint line TTP today.  - Feet: Bilateral pes planovalgus, right greater than left with loss of longitudinal arch.  Mild loss of transverse arch of the right foot.  Imaging:  Narrative & Impression  CLINICAL DATA:  Chronic right shoulder pain.   EXAM: RIGHT SHOULDER - 2+ VIEW   COMPARISON:  None Available.   FINDINGS: There is no evidence of fracture or dislocation. There is no evidence of arthropathy or other focal bone abnormality. A 6.2 mm area of focal soft tissue calcification is seen adjacent to the greater tubercle of the right humeral head.   IMPRESSION: 1. No acute osseous abnormality. 2. Findings suggestive of mild calcific tendinopathy along the greater tubercle of the right humeral head.      Narrative & Impression  CLINICAL DATA:  Chronic knee pain   EXAM: RIGHT KNEE - COMPLETE 4+ VIEW   COMPARISON:  03/18/2019   FINDINGS: No fracture or malalignment. Mild medial and patellofemoral degenerative changes. Positive for knee effusion.   IMPRESSION: Mild degenerative changes without acute osseous abnormality. Knee effusion     Electronically Signed   By: Donavan Foil [name]    On: 12/30/2021 22:26    Past Medical/Family/Surgical/Social History: Medications & Allergies reviewed per EMR, new medications updated. Patient Active Problem List   Diagnosis Date Noted   Acute medial meniscus tear of right knee    Plica syndrome of knee, right    Carpal tunnel syndrome, right 06/23/2021   HLD (hyperlipidemia) 04/29/2020   Type 2 diabetes mellitus without complication, without long-term current use of insulin ([place]) 04/24/2020   Tenosynovitis, de Quervain 06/24/2019   Primary osteoarthritis of first carpometacarpal joint of right hand 03/18/2019   Chronic right shoulder pain 03/18/2019   Bilateral foot pain 03/18/2019   Primary osteoarthritis of both knees 03/18/2019   Fibromyalgia 03/15/2019   Lumbar degenerative disc disease 12/19/2018   Myalgia 11/27/2018   Morning stiffness of joints 11/27/2018   DDD (degenerative disc disease), cervical 11/27/2018   Attention deficit hyperactivity disorder (ADHD), predominantly inattentive type 02/27/2018   Arthralgia 02/27/2018   Hypothyroidism 02/27/2018   NASH (  nonalcoholic steatohepatitis) 02/27/2018   GAD (generalized anxiety disorder) 01/06/2017   Moderate episode of recurrent major depressive disorder ([place]) 01/06/2017   Environmental allergies 09/12/2015   Essential hypertension 09/12/2015   OSA on CPAP 09/12/2015   Past Medical History:  Diagnosis Date   ADHD (attention deficit hyperactivity disorder)    Arthritis    Bowel obstruction (HCC)    Constipation 07/01/2020   Diabetes mellitus without complication (HCC)    Diverticulosis    Fibromyalgia    H/O: hysterectomy 02/27/2018   History of prediabetes 02/27/2018   Hypertension    Post-operative nausea and vomiting    SBO (small bowel obstruction) ([place]) 04/29/2020   Sleep apnea    uses CPAP   Family History  Problem Relation Age of Onset   Lung cancer Father    Diabetes Mother    Skin cancer Mother    Colon cancer Neg Hx    Colon polyps Neg Hx     Esophageal cancer Neg Hx    Rectal cancer Neg Hx    Stomach cancer Neg Hx    Past Surgical History:  Procedure Laterality Date   ABDOMINAL HYSTERECTOMY     ANTERIOR CERVICAL DECOMP/DISCECTOMY FUSION N/A 02/02/2022   Procedure: ANTERIOR CERVICAL DECOMPRESSION AND FUSION CERVICAL 5- CERVICAL 6, CERVICAL 6- CERVICAL 7 WITH INSTRUMENTATION AND ALLOGRAFT;  Surgeon: Phylliss Bob, MD;  Location: [place];  Service: Orthopedics;  Laterality: N/A;   COLONOSCOPY  20 years ago    in [place]=diverticulosis    KNEE ARTHROSCOPY WITH EXCISION PLICA Right 7/82/9562   Procedure: RIGHT KNEE ARTHROSCOPY WITH EXCISION PLICA;  Surgeon: Vanetta Mulders, MD;  Location: [place];  Service: Orthopedics;  Laterality: Right;   LAPAROSCOPY     due to bowel perforation   MYOMECTOMY     OOPHORECTOMY     unilateral   Social History   Occupational History   Not on file  Tobacco Use   Smoking status: Never   Smokeless tobacco: Never  Vaping Use   Vaping Use: Never used  Substance and Sexual Activity   Alcohol use: Yes    Comment: Occassionally   Drug use: Never   Sexual activity: Not Currently    Birth control/protection: Surgical   I spent 40 minutes in the care of the patient today including face-to-face time, preparation to see the patient, as well as independent review of previous imaging, external review of separate notes, counseling and educating the patient on holistic treatment options, shockwave therapy performed for the above diagnoses.

## 2022-03-31 NOTE — Patient Instructions (Signed)
Birdella - it was a pleasure meeting you today.  For the Right shoulder -you do have calcific tendinopathy.  We did a trial of extracorporeal shockwave therapy.  Let me know how this feels in the coming days.  We can always consider additional treatments, usually anywhere from 5-8 is performed if finding benefit.  For the right knee -we discussed that outside of your knee joint, on the lower medial part you have your Pes anserine bursa.  This is a place where 3 tendons me in the knee and sometimes Pain and inflammation.  Look at Pes anserine bursa syndrome and see if this sounds like what you are dealing with. *Trial a body helix knee compressive sleeve  [email or web address]  --> knee compression (neoprene) sleeve  You can follow-up with me at any point to address the feet and try some custom orthotics for the feet    Dr. Michaelle Birks

## 2022-03-31 NOTE — Progress Notes (Signed)
Right knee/Right shoulder pain for several years.  History of surgery to the right knee X-rays of both back in June  Does say there is swelling in the knee compared to the left  Has tried injections into both; they help for a short period of time

## 2022-04-07 ENCOUNTER — Ambulatory Visit (INDEPENDENT_AMBULATORY_CARE_PROVIDER_SITE_OTHER): Payer: No Typology Code available for payment source | Admitting: Podiatrist

## 2022-04-11 ENCOUNTER — Ambulatory Visit: Payer: Self-pay

## 2022-04-11 ENCOUNTER — Encounter: Payer: Self-pay | Admitting: Sports Medicine

## 2022-04-11 ENCOUNTER — Ambulatory Visit (INDEPENDENT_AMBULATORY_CARE_PROVIDER_SITE_OTHER): Payer: No Typology Code available for payment source | Admitting: Sports Medicine

## 2022-04-11 DIAGNOSIS — M25561 Pain in right knee: Secondary | ICD-10-CM | POA: Diagnosis not present

## 2022-04-11 DIAGNOSIS — M25461 Effusion, right knee: Secondary | ICD-10-CM

## 2022-04-11 DIAGNOSIS — R29898 Other symptoms and signs involving the musculoskeletal system: Secondary | ICD-10-CM

## 2022-04-11 DIAGNOSIS — M6520 Calcific tendinitis, unspecified site: Secondary | ICD-10-CM

## 2022-04-11 DIAGNOSIS — Q666 Other congenital valgus deformities of feet: Secondary | ICD-10-CM | POA: Diagnosis not present

## 2022-04-11 DIAGNOSIS — M25552 Pain in left hip: Secondary | ICD-10-CM

## 2022-04-11 DIAGNOSIS — M7741 Metatarsalgia, right foot: Secondary | ICD-10-CM | POA: Diagnosis not present

## 2022-04-11 DIAGNOSIS — M25551 Pain in right hip: Secondary | ICD-10-CM

## 2022-04-11 MED ORDER — METHYLPREDNISOLONE ACETATE 40 MG/ML IJ SUSP
40.0000 mg | INTRAMUSCULAR | Status: AC | PRN
  Administered 2022-04-11: 40 mg via INTRA_ARTICULAR

## 2022-04-11 MED ORDER — LIDOCAINE HCL 1 % IJ SOLN
5.0000 mL | INTRAMUSCULAR | Status: AC | PRN
  Administered 2022-04-11: 5 mL

## 2022-04-11 MED ORDER — BUPIVACAINE HCL 0.25 % IJ SOLN
1.0000 mL | INTRAMUSCULAR | Status: AC | PRN
  Administered 2022-04-11: 1 mL via INTRA_ARTICULAR

## 2022-04-11 NOTE — Progress Notes (Signed)
Feels like the shoulder is doing better. The knee does not feel much improved. Still having the pain Denies OTC medication  Doing some of the provided exercises, general discomfort.  Feels like her gait is altered

## 2022-04-11 NOTE — Progress Notes (Signed)
Melanie [name] - 56 y.o. female MRN [id number]  Date of birth: 06/03/1966  Office Visit Note: Visit Date: 04/11/2022 PCP: Samuel Bouche, NP Referred by: Samuel Bouche, NP  Subjective: Chief Complaint  Patient presents with   Right Shoulder - Pain   Right Knee - Pain   HPI: Melanie [name] is a pleasant 56 y.o. female who presents today for follow-up of right shoulder pain and right knee pain.  Also having pain of bilateral feet, right greater than left.  Right knee - reports history of intermittent recurrent effusion.  Underwent medial plica excision and partial meniscectomy on the medial compartment on January 18, 2022.  She felt great after this for a few weeks to months, however her pain has returned.  She is also noticed some increased swelling on the superior and lateral aspect of the knees.  Has noticed more pain and restriction in range of motion since this time.  Right shoulder -known calcific tendinitis.  She did get fairly decent relief from the first shockwave therapy.  She is interested in continuing this.  Does report increased range of motion with less pain.  Right foot pain -pain in the arch of the plantar surface as well as near the metatarsals.  Does like has been walking differently because of this.  Has seen physical therapy and they state that she pronates when she walks.  Pertinent ROS were reviewed with the patient and found to be negative unless otherwise specified above in HPI.   Assessment & Plan: Visit Diagnoses:  1. Effusion of right knee joint   2. Acute pain of right knee   3. Pes planovalgus   4. Metatarsalgia, right foot   5. Calcific tendinitis    Plan: We discussed multiple conditions Melanie [name] was dealing with today.  She had a large joint effusion of the knee that was causing her pain and swelling.  Through shared decision making, did proceed with ultrasound-guided aspiration and subsequent injection.  Recommend knee compression sleeve, ice  and NSAIDs for any postinjection pain and to help with swelling.  In terms of the right shoulder calcific tendinopathy, she got good benefit from the first shockwave treatment and would like to continue this.  We will have her follow-up in 10-14 days for repeat treatment.  She does have fairly notable weakness over the lateral hip abductors with pain significant for greater trochanteric pain syndrome.  Did give her home exercises to add to her home PT for hip stabilization and lateral hip abductor strengthening.  In terms of her feet, we did fit her for green sports insoles today with metatarsal and scaphoid longitudinal arch pads.  We will see how she feels in these over the next 10-14 days and present for reevaluation for all of the above.  Follow-up: Return for follow-up in 10-14 days with Dr. Rolena Infante.   Meds & Orders: No orders of the defined types were placed in this encounter.   Orders Placed This Encounter  Procedures   [place] Extrem Low Right Ltd     Procedures: Large Joint Inj: R knee on 04/11/2022 5:17 PM Details: 22 G 1.5 in needle, ultrasound-guided anterolateral approach Medications: 5 mL lidocaine 1 %; 1 mL bupivacaine 0.25 %; 40 mg methylPREDNISolone acetate 40 MG/ML Aspirate: 51 mL clear, blood-tinged and yellow Outcome: tolerated well, no immediate complications  US-guided Knee Aspiration, Right: After discussion on risks/benefits/indications was provided, informed verbal consent was obtained and a timeout was performed, patient was lying supine on exam table.  The knee was prepped with alcohol swab.  Utilizing superolateral approach, approximately 5 mL of lidocaine 1% was used for local anesthesia. Then using an 18g needle on 60cc syringe, approx 51 mL of clear blood-tinged colored fluid was aspirated from the knee. Utilizing the same portal, the knee joint was then injected with 1:1 bupivicaine:depomedrol.  Patient tolerated procedure well without immediate complications.  Procedure,  treatment alternatives, risks and benefits explained, specific risks discussed. Consent was given by the patient. Immediately prior to procedure a time out was called to verify the correct patient, procedure, equipment, support staff and site/side marked as required. Patient was prepped and draped in the usual sterile fashion.          Clinical History: No specialty comments available.  She reports that she has never smoked. She has never used smokeless tobacco.  Recent Labs    08/10/21 1628 01/28/22 0918  HGBA1C 5.7 5.7*    Objective:    Physical Exam  Gen: Well-appearing, in no acute distress; non-toxic CV: Regular Rate. Well-perfused. Warm.  Resp: Breathing unlabored on room air; no wheezing. Psych: Fluid speech in conversation; appropriate affect; normal thought process Neuro: Sensation intact throughout. No gross coordination deficits.   Ortho Exam -Right knee: Examination of the right knee shows a moderate-large effusion.  No redness or erythema.  There is some restriction in range of motion from 3-105 degrees with pain at endrange flexion and extension.  No varus or valgus instability.  Ligamentously intact.  Does walk with an antalgic gait favoring the right side.  -Bilateral feet/Gait: There is bilateral pes planovalgus with hyperpronation through walking phase.  There is also a loss of the transverse arch, right greater than left.  No swelling or edema of either foot.  Neurovascular intact.  Imaging: [place] Extrem Low Right Ltd  Result Date: 04/11/2022 [facility] Limited knee ultrasound performed, right Suprapatellar pouch visualized in long and short axis with moderate-large joint effusion.  Hypoechoic fluid fills this with some scattered heteroechoic loose bodies.  Quadriceps tendon appears intact without evidence of tearing or irregularity.  Lateral joint line incompletely visualized with effusion present.  No cortical irregularity.  Pez anserine bursa was identified with trace  amount of bursal fluid and mild hyperemia at the insertion of the 3 tendons to the bone.   Moderate-large joint effusion *Technically successful knee aspiration and subsequent injection         Past Medical/Family/Surgical/Social History: Medications & Allergies reviewed per EMR, new medications updated. Patient Active Problem List   Diagnosis Date Noted   Acute medial meniscus tear of right knee    Plica syndrome of knee, right    Carpal tunnel syndrome, right 06/23/2021   HLD (hyperlipidemia) 04/29/2020   Type 2 diabetes mellitus without complication, without long-term current use of insulin ([place]) 04/24/2020   Tenosynovitis, de Quervain 06/24/2019   Primary osteoarthritis of first carpometacarpal joint of right hand 03/18/2019   Chronic right shoulder pain 03/18/2019   Bilateral foot pain 03/18/2019   Primary osteoarthritis of both knees 03/18/2019   Fibromyalgia 03/15/2019   Lumbar degenerative disc disease 12/19/2018   Myalgia 11/27/2018   Morning stiffness of joints 11/27/2018   DDD (degenerative disc disease), cervical 11/27/2018   Attention deficit hyperactivity disorder (ADHD), predominantly inattentive type 02/27/2018   Arthralgia 02/27/2018   Hypothyroidism 02/27/2018   NASH (nonalcoholic steatohepatitis) 02/27/2018   GAD (generalized anxiety disorder) 01/06/2017   Moderate episode of recurrent major depressive disorder ([place]) 01/06/2017   Environmental allergies 09/12/2015  Essential hypertension 09/12/2015   OSA on CPAP 09/12/2015   Past Medical History:  Diagnosis Date   ADHD (attention deficit hyperactivity disorder)    Arthritis    Bowel obstruction (HCC)    Constipation 07/01/2020   Diabetes mellitus without complication (HCC)    Diverticulosis    Fibromyalgia    H/O: hysterectomy 02/27/2018   History of prediabetes 02/27/2018   Hypertension    Post-operative nausea and vomiting    SBO (small bowel obstruction) ([place]) 04/29/2020   Sleep apnea    uses  CPAP   Family History  Problem Relation Age of Onset   Lung cancer Father    Diabetes Mother    Skin cancer Mother    Colon cancer Neg Hx    Colon polyps Neg Hx    Esophageal cancer Neg Hx    Rectal cancer Neg Hx    Stomach cancer Neg Hx    Past Surgical History:  Procedure Laterality Date   ABDOMINAL HYSTERECTOMY     ANTERIOR CERVICAL DECOMP/DISCECTOMY FUSION N/A 02/02/2022   Procedure: ANTERIOR CERVICAL DECOMPRESSION AND FUSION CERVICAL 5- CERVICAL 6, CERVICAL 6- CERVICAL 7 WITH INSTRUMENTATION AND ALLOGRAFT;  Surgeon: Phylliss Bob, MD;  Location: [facility];  Service: Orthopedics;  Laterality: N/A;   COLONOSCOPY  20 years ago    in [place]=diverticulosis    KNEE ARTHROSCOPY WITH EXCISION PLICA Right 0/35/4656   Procedure: RIGHT KNEE ARTHROSCOPY WITH EXCISION PLICA;  Surgeon: Vanetta Mulders, MD;  Location: [facility];  Service: Orthopedics;  Laterality: Right;   LAPAROSCOPY     due to bowel perforation   MYOMECTOMY     OOPHORECTOMY     unilateral   Social History   Occupational History   Not on file  Tobacco Use   Smoking status: Never   Smokeless tobacco: Never  Vaping Use   Vaping Use: Never used  Substance and Sexual Activity   Alcohol use: Yes    Comment: Occassionally   Drug use: Never   Sexual activity: Not Currently    Birth control/protection: Surgical

## 2022-04-14 ENCOUNTER — Encounter: Payer: Self-pay | Admitting: Podiatrist

## 2022-04-14 ENCOUNTER — Ambulatory Visit (INDEPENDENT_AMBULATORY_CARE_PROVIDER_SITE_OTHER): Payer: No Typology Code available for payment source | Admitting: Podiatrist

## 2022-04-14 ENCOUNTER — Telehealth: Payer: Self-pay | Admitting: Family Medicine

## 2022-04-14 DIAGNOSIS — M25571 Pain in right ankle and joints of right foot: Secondary | ICD-10-CM

## 2022-04-14 DIAGNOSIS — G894 Chronic pain syndrome: Secondary | ICD-10-CM

## 2022-04-14 NOTE — Telephone Encounter (Signed)
Done

## 2022-04-14 NOTE — Telephone Encounter (Signed)
I received a call from [facility] office stating that the patient is in need for further PT sessions but she has met her max for insurance. In order for them to request more and hopefully get approval from her insurance company, they will need a new order from Dr Georgina Snell. She said that it can be sent over as a prescription stating "to continue physical therapy".   Fax # [phone]

## 2022-04-22 ENCOUNTER — Ambulatory Visit: Payer: No Typology Code available for payment source | Admitting: Podiatrist

## 2022-04-25 ENCOUNTER — Encounter: Payer: Self-pay | Admitting: Sports Medicine

## 2022-04-26 ENCOUNTER — Encounter: Payer: Self-pay | Admitting: Sports Medicine

## 2022-04-26 ENCOUNTER — Ambulatory Visit (INDEPENDENT_AMBULATORY_CARE_PROVIDER_SITE_OTHER): Payer: No Typology Code available for payment source | Admitting: Sports Medicine

## 2022-04-26 ENCOUNTER — Telehealth: Payer: Self-pay | Admitting: Sports Medicine

## 2022-04-26 DIAGNOSIS — M25561 Pain in right knee: Secondary | ICD-10-CM | POA: Diagnosis not present

## 2022-04-26 DIAGNOSIS — M25511 Pain in right shoulder: Secondary | ICD-10-CM

## 2022-04-26 DIAGNOSIS — M7741 Metatarsalgia, right foot: Secondary | ICD-10-CM

## 2022-04-26 DIAGNOSIS — M25461 Effusion, right knee: Secondary | ICD-10-CM

## 2022-04-26 DIAGNOSIS — G8929 Other chronic pain: Secondary | ICD-10-CM

## 2022-04-26 DIAGNOSIS — M6520 Calcific tendinitis, unspecified site: Secondary | ICD-10-CM | POA: Diagnosis not present

## 2022-04-26 NOTE — Telephone Encounter (Signed)
FMLA forms received via "mychart". To [employer].

## 2022-04-26 NOTE — Progress Notes (Signed)
Has been doing ok; last few days the swelling has reappeared   Shoulder pain; doing better , just some tenderness   Temporary orthotics; not comfortable on left foot, ok on the right

## 2022-04-26 NOTE — Progress Notes (Addendum)
Melanie [name] - 56 y.o. female MRN [id number]  Date of birth: 17-Dec-1965  Office Visit Note: Visit Date: 04/26/2022 PCP: Samuel Bouche, NP Referred by: Samuel Bouche, NP  Subjective: Chief Complaint  Patient presents with   Right Knee - Pain   HPI: Melanie [name] is a pleasant 56 y.o. female who presents today for follow-up of right knee pain, right shoulder pain, foot pain.   Right knee pain -history of intermittent recurrent effusion.  Underwent medial plica excision and partial meniscectomy on the medial compartment on January 18, 2022 by Dr. Sammuel Hines.  At last visit, we aspirated a rather large effusion off the knee and subsequently injected it with corticosteroid.  She had excellent relief following this and had improvement in pain and range of motion for the first time in a while.  Unfortunately over the last few days she has felt like she has had a room accumulation of some of the effusion.  She has not yet been back to work, but would like to get back to work.  However, she is slightly concerned given the degree of walking and on her feet work that this may reexacerbated the knee pain.  Has not been unable to test the knee yet as she unfortunately had a recent death of her dog and was caring for it during the recent time.  Right shoulder with known calcific tendinitis -we have done 1 shockwave therapy thus far.  Interested in repeat today.  Pain is less bothersome, but will sometimes notice it with certain motions.  Right foot pain/metatarsalgia -has tolerated the green sports insoles with bilateral scaphoid and metatarsal pads, feels like the right has been improving and comfortable.  Her left metatarsal pad feels like it is slightly in the wrong position.  Pertinent ROS were reviewed with the patient and found to be negative unless otherwise specified above in HPI.   Assessment & Plan: Visit Diagnoses:  1. Chronic pain of right knee   2. Calcific tendinitis   3. Chronic  right shoulder pain   4. Effusion of right knee joint   5. Metatarsalgia, right foot    Plan: I discussed with Benjamine Mola the treatment plan for her multiple conditions.  I do think her metatarsalgia is improving, did slightly move her metatarsal pad on the left today.  She may move this to get this into the correct position but is to continue with the insoles.  We did trial an additional extracorporeal shockwave treatment for her rotator cuff calcific tendinitis, she may represent for this if she is finding benefit.  Continue home exercises.  Her most bothersome ailment is her recurrent right knee pain.  She received excellent relief from her prior aspiration and injection, but over the last few days she has reaccumulated some of her effusion.  She has not yet gone back to work, and both she and I are hesitant to fully release her to 12-hour days with which require a lot of walking, standing and activity.  I think at this point, she should hold off on returning to work for an additional 3 weeks in which she will slowly get back into physical activity on her own accord for the knee and see how this responds to graded return to activity and daily stresses upon the knee.  I do think that she may continue to have recurrent effusions which I would like her to try to manage with ice, elevation as well as wearing a compressive knee sleeve that she  has anytime she is up and active on her feet. We will consider repeat aspiration at later date if not improved by conservative measures. Dr. Sammuel Hines has released her from his care from her prior meniscal debridement and plica excision on 9/32/6712.  She will increase her activity with the knee and continue her formalized physical therapy twice weekly for the knee.  Also continue home therapy to work on strengthening the quadricep muscles.  We will reevaluate in 3 weeks to discuss next steps moving forward.  If she continues to develop recurrent effusions, it may be wise to  repeat an MRI of the knee at some point in the near future.  She can continue over-the-counter anti-inflammatories as needed, may consider scheduled meloxicam or Celebrex at a later time.  Follow-up: Return in about 3 weeks (around 05/17/2022).   Meds & Orders: No orders of the defined types were placed in this encounter.  No orders of the defined types were placed in this encounter.    Procedures:   Procedure: ECSWT Indications:  Right shoulder calcific tendinitis   Procedure Details Consent: Risks of procedure as well as the alternatives and risks of each were explained to the patient.  Verbal consent for procedure obtained. Time Out: Verified patient identification, verified procedure, site was marked, verified correct patient position. The area was cleaned with alcohol swab.     The Right anterolateral rotator cuff and shoulder was targeted for Extracorporeal shockwave therapy.    Preset: Calcific tendinitis Power Level: 120 mJ Frequency: 16 Hz Impulse/cycles: 3000 Head size: Regular   Patient tolerated procedure well without immediate complications.     Clinical History: No specialty comments available.  She reports that she has never smoked. She has never used smokeless tobacco.  Recent Labs    08/10/21 1628 01/28/22 0918  HGBA1C 5.7 5.7*    Objective:    Physical Exam  Gen: Well-appearing, in no acute distress; non-toxic CV: Well-perfused. Warm.  Resp: Breathing unlabored on room air; no wheezing. Psych: Fluid speech in conversation; appropriate affect; normal thought process Neuro: Sensation intact throughout. No gross coordination deficits.   Ortho Exam  - Right knee: Examination of the right knee does show a moderate effusion.  There is no redness or surrounding erythema.  Range of motion from 0-125 degrees.  No significant medial joint line TTP.  No varus or valgus instability.  Ligamentously intact.  Strength 5/5 in all directions although there is some  quadricep atrophy compared to the contralateral leg.  -Bilateral feet/Gait: There is bilateral pes planovalgus with hyperpronation through walking phase.  There is also a loss of the transverse arch, right greater than left.  No swelling or edema of either foot.  Neurovascular intact.  Imaging: No results found.  Past Medical/Family/Surgical/Social History: Medications & Allergies reviewed per EMR, new medications updated. Patient Active Problem List   Diagnosis Date Noted   Acute medial meniscus tear of right knee    Plica syndrome of knee, right    Carpal tunnel syndrome, right 06/23/2021   HLD (hyperlipidemia) 04/29/2020   Type 2 diabetes mellitus without complication, without long-term current use of insulin ([place]) 04/24/2020   Tenosynovitis, de Quervain 06/24/2019   Primary osteoarthritis of first carpometacarpal joint of right hand 03/18/2019   Chronic right shoulder pain 03/18/2019   Bilateral foot pain 03/18/2019   Primary osteoarthritis of both knees 03/18/2019   Fibromyalgia 03/15/2019   Lumbar degenerative disc disease 12/19/2018   Myalgia 11/27/2018   Morning stiffness of joints 11/27/2018  DDD (degenerative disc disease), cervical 11/27/2018   Attention deficit hyperactivity disorder (ADHD), predominantly inattentive type 02/27/2018   Arthralgia 02/27/2018   Hypothyroidism 02/27/2018   NASH (nonalcoholic steatohepatitis) 02/27/2018   GAD (generalized anxiety disorder) 01/06/2017   Moderate episode of recurrent major depressive disorder ([place]) 01/06/2017   Environmental allergies 09/12/2015   Essential hypertension 09/12/2015   OSA on CPAP 09/12/2015   Past Medical History:  Diagnosis Date   ADHD (attention deficit hyperactivity disorder)    Arthritis    Bowel obstruction (HCC)    Constipation 07/01/2020   Diabetes mellitus without complication (HCC)    Diverticulosis    Fibromyalgia    H/O: hysterectomy 02/27/2018   History of prediabetes 02/27/2018    Hypertension    Post-operative nausea and vomiting    SBO (small bowel obstruction) ([place]) 04/29/2020   Sleep apnea    uses CPAP   Family History  Problem Relation Age of Onset   Lung cancer Father    Diabetes Mother    Skin cancer Mother    Colon cancer Neg Hx    Colon polyps Neg Hx    Esophageal cancer Neg Hx    Rectal cancer Neg Hx    Stomach cancer Neg Hx    Past Surgical History:  Procedure Laterality Date   ABDOMINAL HYSTERECTOMY     ANTERIOR CERVICAL DECOMP/DISCECTOMY FUSION N/A 02/02/2022   Procedure: ANTERIOR CERVICAL DECOMPRESSION AND FUSION CERVICAL 5- CERVICAL 6, CERVICAL 6- CERVICAL 7 WITH INSTRUMENTATION AND ALLOGRAFT;  Surgeon: Phylliss Bob, MD;  Location: [facility];  Service: Orthopedics;  Laterality: N/A;   COLONOSCOPY  20 years ago    in [place]=diverticulosis    KNEE ARTHROSCOPY WITH EXCISION PLICA Right 7/35/3299   Procedure: RIGHT KNEE ARTHROSCOPY WITH EXCISION PLICA;  Surgeon: Vanetta Mulders, MD;  Location: [facility];  Service: Orthopedics;  Laterality: Right;   LAPAROSCOPY     due to bowel perforation   MYOMECTOMY     OOPHORECTOMY     unilateral   Social History   Occupational History   Not on file  Tobacco Use   Smoking status: Never   Smokeless tobacco: Never  Vaping Use   Vaping Use: Never used  Substance and Sexual Activity   Alcohol use: Yes    Comment: Occassionally   Drug use: Never   Sexual activity: Not Currently    Birth control/protection: Surgical   I spent 47 minutes in the care of the patient today including face-to-face time, preparation to see the patient, as well as reviewed previous notes; counseling and educating the patient on home therapy and all treatment options; discussed FMLA and disability paperwork and return to work; insole modification today for the above diagnoses.

## 2022-04-29 ENCOUNTER — Encounter: Payer: Self-pay | Admitting: Family Medicine

## 2022-05-04 ENCOUNTER — Encounter: Payer: Self-pay | Admitting: Sports Medicine

## 2022-05-05 ENCOUNTER — Other Ambulatory Visit: Payer: Self-pay | Admitting: Sports Medicine

## 2022-05-05 ENCOUNTER — Ambulatory Visit: Payer: No Typology Code available for payment source

## 2022-05-05 DIAGNOSIS — G8929 Other chronic pain: Secondary | ICD-10-CM

## 2022-05-13 ENCOUNTER — Other Ambulatory Visit: Payer: Self-pay | Admitting: Sports Medicine

## 2022-05-13 ENCOUNTER — Ambulatory Visit: Payer: No Typology Code available for payment source | Admitting: Sports Medicine

## 2022-05-16 ENCOUNTER — Ambulatory Visit (INDEPENDENT_AMBULATORY_CARE_PROVIDER_SITE_OTHER): Payer: No Typology Code available for payment source

## 2022-05-16 ENCOUNTER — Ambulatory Visit: Payer: No Typology Code available for payment source | Admitting: Sports Medicine

## 2022-05-16 DIAGNOSIS — M25561 Pain in right knee: Secondary | ICD-10-CM

## 2022-05-16 DIAGNOSIS — G8929 Other chronic pain: Secondary | ICD-10-CM

## 2022-05-17 ENCOUNTER — Other Ambulatory Visit: Payer: No Typology Code available for payment source

## 2022-05-19 ENCOUNTER — Ambulatory Visit: Payer: No Typology Code available for payment source | Admitting: Podiatrist

## 2022-05-19 ENCOUNTER — Other Ambulatory Visit: Payer: Self-pay

## 2022-05-19 ENCOUNTER — Other Ambulatory Visit (HOSPITAL_COMMUNITY): Payer: Self-pay

## 2022-05-19 ENCOUNTER — Ambulatory Visit: Payer: No Typology Code available for payment source

## 2022-05-19 ENCOUNTER — Other Ambulatory Visit: Payer: Self-pay | Admitting: Sports Medicine

## 2022-05-19 ENCOUNTER — Ambulatory Visit (INDEPENDENT_AMBULATORY_CARE_PROVIDER_SITE_OTHER): Payer: No Typology Code available for payment source

## 2022-05-19 ENCOUNTER — Ambulatory Visit (INDEPENDENT_AMBULATORY_CARE_PROVIDER_SITE_OTHER): Payer: No Typology Code available for payment source | Admitting: Sports Medicine

## 2022-05-19 ENCOUNTER — Ambulatory Visit: Payer: Self-pay

## 2022-05-19 ENCOUNTER — Encounter: Payer: Self-pay | Admitting: Podiatrist

## 2022-05-19 DIAGNOSIS — M25571 Pain in right ankle and joints of right foot: Secondary | ICD-10-CM

## 2022-05-19 DIAGNOSIS — M79672 Pain in left foot: Secondary | ICD-10-CM | POA: Diagnosis not present

## 2022-05-19 DIAGNOSIS — L851 Acquired keratosis [keratoderma] palmaris et plantaris: Secondary | ICD-10-CM

## 2022-05-19 DIAGNOSIS — G8929 Other chronic pain: Secondary | ICD-10-CM

## 2022-05-19 DIAGNOSIS — M2041 Other hammer toe(s) (acquired), right foot: Secondary | ICD-10-CM | POA: Diagnosis not present

## 2022-05-19 DIAGNOSIS — M25561 Pain in right knee: Secondary | ICD-10-CM

## 2022-05-19 DIAGNOSIS — M25461 Effusion, right knee: Secondary | ICD-10-CM | POA: Diagnosis not present

## 2022-05-19 DIAGNOSIS — G589 Mononeuropathy, unspecified: Secondary | ICD-10-CM | POA: Diagnosis not present

## 2022-05-19 DIAGNOSIS — M2550 Pain in unspecified joint: Secondary | ICD-10-CM | POA: Diagnosis not present

## 2022-05-19 DIAGNOSIS — M2142 Flat foot [pes planus] (acquired), left foot: Secondary | ICD-10-CM | POA: Diagnosis not present

## 2022-05-19 DIAGNOSIS — M25572 Pain in left ankle and joints of left foot: Secondary | ICD-10-CM | POA: Diagnosis not present

## 2022-05-19 DIAGNOSIS — M2141 Flat foot [pes planus] (acquired), right foot: Secondary | ICD-10-CM

## 2022-05-19 DIAGNOSIS — R208 Other disturbances of skin sensation: Secondary | ICD-10-CM

## 2022-05-19 DIAGNOSIS — M79671 Pain in right foot: Secondary | ICD-10-CM

## 2022-05-19 DIAGNOSIS — R29898 Other symptoms and signs involving the musculoskeletal system: Secondary | ICD-10-CM

## 2022-05-19 DIAGNOSIS — M6289 Other specified disorders of muscle: Secondary | ICD-10-CM

## 2022-05-19 MED ORDER — LIDOCAINE HCL 1 % IJ SOLN
2.0000 mL | INTRAMUSCULAR | Status: AC | PRN
  Administered 2022-05-19: 2 mL

## 2022-05-19 MED ORDER — BUPIVACAINE HCL 0.25 % IJ SOLN
1.0000 mL | INTRAMUSCULAR | Status: AC | PRN
  Administered 2022-05-19: 1 mL via INTRA_ARTICULAR

## 2022-05-19 MED ORDER — LIDO-CAPSAICIN-MEN-METHYL SAL 0.5-0.035-5-20 % EX PTCH
1.0000 | MEDICATED_PATCH | Freq: Two times a day (BID) | CUTANEOUS | 1 refills | Status: AC | PRN
  Filled 2022-05-19 – 2022-05-31 (×2): qty 30, fill #0

## 2022-05-19 NOTE — Progress Notes (Signed)
No chief complaint on file.    HPI: Patient is 56 y.o. female who presents today for several foot issues. She has pain on the bottoms of her feet, especially at the forefoot, she pronates as well.  She has several toenails that were discolored- this has since grown out.  Her great toenails can be painful on the corners at times- today they are not bothersome. She has a small lesion on the lateral left foot and wonders if it could be a wart.  She also relates a second digit hammertoe on the right foot that is getting worse.  She has recently had knee surgery and relates she favors her left foot. She also has pain especially when she stands on her feet in one place.  She wears allegrias but doesn't feel like they offer her enough support.  She has an otc spenco type insert with felt padding added, but this isn't helpful.    Patient Active Problem List   Diagnosis Date Noted   Acute medial meniscus tear of right knee    Plica syndrome of knee, right    Carpal tunnel syndrome, right 06/23/2021   HLD (hyperlipidemia) 04/29/2020   Type 2 diabetes mellitus without complication, without long-term current use of insulin ([place]) 04/24/2020   Tenosynovitis, de Quervain 06/24/2019   Primary osteoarthritis of first carpometacarpal joint of right hand 03/18/2019   Chronic right shoulder pain 03/18/2019   Bilateral foot pain 03/18/2019   Primary osteoarthritis of both knees 03/18/2019   Fibromyalgia 03/15/2019   Lumbar degenerative disc disease 12/19/2018   Myalgia 11/27/2018   Morning stiffness of joints 11/27/2018   DDD (degenerative disc disease), cervical 11/27/2018   Attention deficit hyperactivity disorder (ADHD), predominantly inattentive type 02/27/2018   Arthralgia 02/27/2018   Hypothyroidism 02/27/2018   NASH (nonalcoholic steatohepatitis) 02/27/2018   GAD (generalized anxiety disorder) 01/06/2017   Moderate episode of recurrent major depressive disorder ([place]) 01/06/2017   Environmental  allergies 09/12/2015   Essential hypertension 09/12/2015   OSA on CPAP 09/12/2015    Current Outpatient Medications on File Prior to Visit  Medication Sig Dispense Refill   acetaminophen (TYLENOL) 500 MG tablet Take 500 mg by mouth every 12 (twelve) hours as needed (pain).     AMBULATORY NON FORMULARY MEDICATION Continuous positive airway pressure (CPAP) machine set at [employer] (8-1m of water pressure), with all supplemental supplies as needed. 1 each 0   blood glucose meter kit and supplies KIT Dispense based on patient and insurance preference. Use up to four times daily as directed. Please include lancets, test strips, control solution. 1 each 0   ipratropium (ATROVENT) 0.03 % nasal spray PLACE 2 SPRAYS INTO BOTH NOSTRILS TWO TIMES DAILY (Patient taking differently: Place 2 sprays into both nostrils See admin instructions. Instill 2 sprays into each nostril ever morning, may use again the evening as needed for congestion) 30 mL 2   levocetirizine (XYZAL) 5 MG tablet TAKE 1 TABLET BY MOUTH EVERY EVENING (Patient taking differently: Take 5 mg by mouth every evening.) 90 tablet 1   lisdexamfetamine (VYVANSE) 60 MG capsule Take 1 capsule (60 mg total) by mouth every morning. 30 capsule 0   losartan (COZAAR) 50 MG tablet Take 1 tablet (50 mg total) by mouth every evening. 90 tablet 1   methocarbamol (ROBAXIN) 750 MG tablet Take 1 tablet (750 mg total) by mouth every 6 (six) hours as needed for muscle spasms. 60 tablet 0   Naltrexone POWD 1.5 mg by mouth at night for 14  days then increasing to 3 mg by mouth at night. Dispense quantity sufficient for 90 days 250 g 3   ondansetron (ZOFRAN-ODT) 8 MG disintegrating tablet Dissolve 1 tablet by mouth every 8 hours as needed for nausea. 20 tablet 3   OVER THE COUNTER MEDICATION Take 4 tablets by mouth every morning. Multivitamin with fish oil - gummies     tirzepatide (MOUNJARO) 5 MG/0.5ML Pen Inject 5 mg into the skin once a week. 6 mL 0   No current  facility-administered medications on file prior to visit.    Allergies  Allergen Reactions   Morphine Other (See Comments)    mental status alerted   Sulfa Antibiotics Hives   Clarithromycin Rash and Other (See Comments)    Other Reaction: Fever    Review of Systems No fevers, chills, nausea, muscle aches, no difficulty breathing, no calf pain, no chest pain or shortness of breath.   Physical Exam  GENERAL APPEARANCE: Alert, conversant. Appropriately groomed. No acute distress.   VASCULAR: Pedal pulses palpable 2/4 DP and  PT bilateral.  Capillary refill time is immediate to all digits,  Proximal to distal cooling is warm to warm.  Digital perfusion adequate.   NEUROLOGIC: sensation is intact bilateral.  No numbness or tingling reported.   MUSCULOSKELETAL: acceptable muscle strength, tone and stability bilateral.  Flexible pes planus foot type is noted. Hammertoe second toe right foot.  Toes 2 and 3 splay when standing as the second toe drifts medially at the metatarsal phalangeal joint-  also seen on xray.    DERMATOLOGIC: skin is warm, supple, and dry.  Color, texture, and turgor of skin within normal limits. Digital nails are not infected.  Great toenails minimally too wide for underlying nail bed- no irritation noted today.  Otherwise Nails appear normal. No streaking within the nails, no discoloration or sign of fungus noted.  Small porokeratotic lesion present plantar lateral aspect of the left foot.  Skin tension lines present-  no capillary budding noted.   Xrays:  3 views right foot show hammertoe deformity right second toe.  Contracture at the MPJ and medial translation of the second toe at the MPJ is noted. Sllight sag notable at Navicular cuneiform joint noted.  3 views of right ankle are normal. Normal joint space noted. No arthritis present.   3 views of the left foot show a normal xray. No acute osseous abnormalities are present. slight sag at the [facility] joint seen left as  well.   Assessment     ICD-10-CM   1. Right ankle pain, unspecified chronicity  M25.571 DG Ankle Complete Right    CANCELED: DG Ankle Complete Right    2. Left foot pain  M79.672 DG Foot Complete Left    3. Hammertoe of second toe of right foot  M20.41 DG Foot Complete Right    4. 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 Acquired flexible flat foot of right lower extremity  M21.41     6. Acquired plantar porokeratosis  L85.1        Plan  Discussed exam and xray findings. Discussed inserts and shoegear.  Discussed the nails appear stable and I would recommend keeping an eye on them for now.  She can soak in epsom salts if they start to bother her.  Recommended waiting to do any permanent procedure as at this time they are not infected and don't appear irritated. Discussed using aquaphor around the nails to  soften the nailbed and reduce irritation.  Also discussed use of compound w on the porokeratotic lesion if it becomes painful.   She has general foot pain on the plantar foot and forefoot. Her flexible flat foot is likely contributing to this discomfort.  Discussed shoegear changes and recommended she go to the [employer] for comfort shoes and off n running or [employer] for brooks, hoka, etc sneakers dependent on if she would like help with the shoes.  Discussed otc inserts vs custom orthotics.  She would like to proceed with custom and was casted at todays visit.  I gave her the orthotic estimate sheet and encouraged her to call to see if her insurance covers the orthotics and gave her the cash price of $490 if not covered.  She asked for them to go ahead and be ordered. She was casted in foam today and  We will call when inserts are back and ready for pick up.

## 2022-05-19 NOTE — Addendum Note (Signed)
Addended by: Marlyne Beards on: 05/19/2022 02:50 PM   Modules accepted: Orders

## 2022-05-19 NOTE — Progress Notes (Signed)
Mri follow up

## 2022-05-19 NOTE — Addendum Note (Signed)
Addended by: Renne Musca III on: 05/19/2022 01:04 PM   Modules accepted: Orders

## 2022-05-19 NOTE — Addendum Note (Signed)
Addended by: Marlyne Beards on: 05/19/2022 03:04 PM   Modules accepted: Orders

## 2022-05-19 NOTE — Addendum Note (Signed)
Addended by: Marlyne Beards on: 05/19/2022 03:08 PM   Modules accepted: Orders

## 2022-05-19 NOTE — Progress Notes (Signed)
[name] - 56 y.o. female MRN [id number]  Date of birth: 1965-09-03  Office Visit Note: Visit Date: 05/19/2022 PCP: Samuel Bouche, NP Referred by: Samuel Bouche, NP  Subjective: Chief Complaint  Patient presents with   Right Knee - Pain   HPI: Melanie [name] is a pleasant 56 y.o. female who presents today for follow-up of chronic right knee pain with MRI review.  MRI was also done 05/18/2022 which showed some irregularity of the medial meniscus likely related to interval partial meniscectomy.  There is some evidence of the meniscus being partially extruded from the joint.  Moderate knee joint effusion with synovitis.  Radiology read possible denervation of the extensor musculature of the lower leg.  Tirzah continues to be burdened by her knee pain and recurrent effusion.  Her swelling continues to recur and she has pain on the medial side of the right knee.  Occasional for some buckling or some giving way of the knee.  She continues with formalized physical therapy once or twice daily.  She is hopeful to get back to regular activities such as walking, as she is unable to do this for longer distances secondary to the pain.  She is on low-dose naltrexone.   She does have a history of polyarthralgias as well.  Does report having a tick bite in the past, but did not believe it was there for prolonged period of time.  She does have polyarthralgia.  No prior diagnosis of Lyme disease but has had tic encounters.   Following her knee surgery she has felt like the right leg feels full or heavy from the knee down.  She denies any numbness tingling.  She does note some mild superficial diminished sensation around the prior arthroscopy site.  Her right leg feels heavy and full on the anterior side.  Denies any pain coming from the back or upper in the thigh.  Pertinent ROS were reviewed with the patient and found to be negative unless otherwise specified above in HPI.   Assessment &  Plan: Visit Diagnoses:  1. Chronic pain of right knee   2. Effusion, right knee   3. Polyarthralgia   4. Denervation of muscle   5. Leg heaviness    Plan: We did review [name]'s knee MRI today.  She does have some meniscal irregularity, some to which I think is from her prior meniscectomy.  She does however have some evidence of radial tearing near the root with some partial extrusion from the joint that I think is certainly contributing to her recurrent knee effusion and pain.  Unfortunately she continues to have recurrent knee effusions but does get good temporary relief with aspiration and injection.  Her knee pain has been on and off persistent however that is limiting her activities of daily living including walking and trying to exercise.  I would like to ascertain the cause of her recurrent effusion, will obtain synovial fluid labs including cell count with differential, Gram stain with culture, fluid analysis.  We will also add on a Lyme PCR synovial fluid given her polyarthralgias, recurrent knee effusion and prior history of exposure to ticks.  We will treat this as results come back.  If primary inflammatory cause of the effusion, likely will send back for surgical opinion for her meniscal injury.  I did fill out a temporary handicap placard for 6 months for her today.    She does have some sensation of the right leg feeling heavy and full from the  knee down, questioning whether this is from her prior arthroscopy surgery or if she has any evidence of denervation.  Her MRI did show hyperintensity within the extensor musculature of the proximal lower leg, potentially related to denervation.  Given this, would like to send her to my partner, Dr. Gareth Morgan, to evaluate with EMG/NCS as he feels is appropriate.  She does not have symptoms coming from the back based on my exam.   Follow-up: Will f/u depending on fluid studies; to see Dr. Ernestina Patches for LE EMG/NCS   Meds & Orders: No orders of the  defined types were placed in this encounter.   Orders Placed This Encounter  Procedures   Large Joint Inj   Gram Stain/Body Fluid Culture   US Guided Needle Placement - No Linked Charges   Borrelia species DNA (FLUID), PCR ([place])   Cell count + diff,  w/ cryst-synvl fld   Ambulatory referral to Physical Medicine Rehab     Procedures: Large Joint Inj: R knee on 05/19/2022 11:59 AM Indications: joint swelling and pain Details: 22 G 1.5 in needle, ultrasound-guided anterolateral approach Medications: 2 mL lidocaine 1 %; 1 mL bupivacaine 0.25 % (Zilretta (triamcinolone acetonide extended release, 17m)) Outcome: tolerated well, no immediate complications  US-guided Knee Aspiration and Injection, Right: After discussion on risks/benefits/indications was provided, informed verbal consent was obtained and a timeout was performed, patient was lying supine on exam table. The knee was prepped with alcohol swab.  Utilizing superolateral approach and ultrasound guidance, approximately 5 mL of lidocaine 1% was used for local anesthesia. Then using an 18g needle on 60cc syringe, approximately 40 mL of clear straw-colored fluid, slightly blood tinged was aspirated from the knee. Utilizing the same portal under ultrasound guidance, the knee joint was then injected with 360mof Zilretta (triamcinolone acetonide extended release injectate) patient tolerated procedure well without immediate complications.  Procedure, treatment alternatives, risks and benefits explained, specific risks discussed. Consent was given by the patient. Patient was prepped and draped in the usual sterile fashion.          Clinical History: No specialty comments available.  She reports that she has never smoked. She has never used smokeless tobacco.  Recent Labs    08/10/21 1628 01/28/22 0918  HGBA1C 5.7 5.7*    Objective:    Physical Exam  Gen: Well-appearing, in no acute distress; non-toxic CV: Regular Rate.  Well-perfused. Warm.  Resp: Breathing unlabored on room air; no wheezing. Psych: Fluid speech in conversation; appropriate affect; normal thought process Neuro: Sensation intact throughout. No gross coordination deficits.   Ortho Exam - Right knee/LE:   Imaging:  MR Knee Right w/o contrast CLINICAL DATA:  Knee pain with swelling for 2 months. History of right knee surgery four months ago (synovectomy with medial plica resection and partial medial meniscectomy). No recent injury.  EXAM: MRI OF THE RIGHT KNEE WITHOUT CONTRAST  TECHNIQUE: Multiplanar, multisequence MR imaging of the knee was performed. No intravenous contrast was administered.  COMPARISON:  Radiographs 12/29/2021.  MRI 04/18/2020.  FINDINGS: MENISCI  Medial meniscus: New diffuse free edge irregularity of the posterior horn and body which may relate to interval partial meniscectomy. Cannot exclude a radial tear near the meniscal root, best seen on coronal image 10/6. There is new peripheral extrusion of the meniscus from the joint space.  Lateral meniscus:  Intact with normal morphology.  LIGAMENTS  Cruciates:  Intact.  Collaterals:  Intact.  CARTILAGE  Patellofemoral:  Preserved.  Medial: Progressive chondral thinning  and surface irregularity without focal defect or significant subchondral signal abnormality.  Lateral:  Preserved.  MISCELLANEOUS  Joint: Moderate-sized knee joint effusion has enlarged and demonstrates synovial irregularity suspicious for synovitis.  Popliteal Fossa: The popliteus muscle and tendon are intact. No significant Baker's cyst.  Extensor Mechanism:  Intact.  Bones:  No acute or significant extra-articular osseous findings.  Other: New mild T2 hyperintensity within the extensor musculature in the proximal lower leg. No other significant muscular abnormalities identified.  IMPRESSION: 1. New diffuse free edge irregularity of the posterior horn and body of the  medial meniscus which may relate to interval partial meniscectomy. Cannot exclude a new radial tear near the meniscal root, and the meniscus is partially extruded from the joint. 2. Progressive medial compartment degenerative chondrosis. No acute osseous findings. 3. Moderate-sized knee joint effusion with synovial irregularity suspicious for synovitis. 4. New mild T2 hyperintensity within the extensor musculature in the proximal lower leg, potentially related to denervation. 5. The lateral meniscus, cruciate and collateral ligaments are intact.  Electronically Signed   By: Richardean Sale [name]   On: 05/18/2022 09:12    Past Medical/Family/Surgical/Social History: Medications & Allergies reviewed per EMR, new medications updated. Patient Active Problem List   Diagnosis Date Noted   Acute medial meniscus tear of right knee    Plica syndrome of knee, right    Carpal tunnel syndrome, right 06/23/2021   HLD (hyperlipidemia) 04/29/2020   Type 2 diabetes mellitus without complication, without long-term current use of insulin ([place]) 04/24/2020   Tenosynovitis, de Quervain 06/24/2019   Primary osteoarthritis of first carpometacarpal joint of right hand 03/18/2019   Chronic right shoulder pain 03/18/2019   Bilateral foot pain 03/18/2019   Primary osteoarthritis of both knees 03/18/2019   Fibromyalgia 03/15/2019   Lumbar degenerative disc disease 12/19/2018   Myalgia 11/27/2018   Morning stiffness of joints 11/27/2018   DDD (degenerative disc disease), cervical 11/27/2018   Attention deficit hyperactivity disorder (ADHD), predominantly inattentive type 02/27/2018   Arthralgia 02/27/2018   Hypothyroidism 02/27/2018   NASH (nonalcoholic steatohepatitis) 02/27/2018   GAD (generalized anxiety disorder) 01/06/2017   Moderate episode of recurrent major depressive disorder ([place]) 01/06/2017   Environmental allergies 09/12/2015   Essential hypertension 09/12/2015   OSA on CPAP 09/12/2015    Past Medical History:  Diagnosis Date   ADHD (attention deficit hyperactivity disorder)    Arthritis    Bowel obstruction ([place])    Constipation 07/01/2020   Diabetes mellitus without complication ([place])    Diverticulosis    Fibromyalgia    H/O: hysterectomy 02/27/2018   History of prediabetes 02/27/2018   Hypertension    Post-operative nausea and vomiting    SBO (small bowel obstruction) ([place]) 04/29/2020   Sleep apnea    uses CPAP   Family History  Problem Relation Age of Onset   Lung cancer Father    Diabetes Mother    Skin cancer Mother    Colon cancer Neg Hx    Colon polyps Neg Hx    Esophageal cancer Neg Hx    Rectal cancer Neg Hx    Stomach cancer Neg Hx    Past Surgical History:  Procedure Laterality Date   ABDOMINAL HYSTERECTOMY     ANTERIOR CERVICAL DECOMP/DISCECTOMY FUSION N/A 02/02/2022   Procedure: ANTERIOR CERVICAL DECOMPRESSION AND FUSION CERVICAL 5- CERVICAL 6, CERVICAL 6- CERVICAL 7 WITH INSTRUMENTATION AND ALLOGRAFT;  Surgeon: Phylliss Bob, MD;  Location: [facility];  Service: Orthopedics;  Laterality: N/A;   COLONOSCOPY  20  years ago    in =diverticulosis    KNEE ARTHROSCOPY WITH EXCISION PLICA Right 2/68/3419   Procedure: RIGHT KNEE ARTHROSCOPY WITH EXCISION PLICA;  Surgeon: Vanetta Mulders, MD;  Location: [facility];  Service: Orthopedics;  Laterality: Right;   LAPAROSCOPY     due to bowel perforation   MYOMECTOMY     OOPHORECTOMY     unilateral   Social History   Occupational History   Not on file  Tobacco Use   Smoking status: Never   Smokeless tobacco: Never  Vaping Use   Vaping Use: Never used  Substance and Sexual Activity   Alcohol use: Yes    Comment: Occassionally   Drug use: Never   Sexual activity: Not Currently    Birth control/protection: Surgical

## 2022-05-20 ENCOUNTER — Ambulatory Visit: Payer: No Typology Code available for payment source | Admitting: Podiatrist

## 2022-05-20 ENCOUNTER — Other Ambulatory Visit (HOSPITAL_COMMUNITY): Payer: Self-pay

## 2022-05-20 ENCOUNTER — Telehealth: Payer: Self-pay

## 2022-05-20 NOTE — Telephone Encounter (Signed)
I called Melanie [name] to let her know that her orthotics are not covered by her insurance. She stated that more than likely she did not want her move forwarded , but she wanted [place] to give her until the middle of next week to make a final decision.

## 2022-05-24 ENCOUNTER — Encounter: Payer: Self-pay | Admitting: Podiatrist

## 2022-05-24 ENCOUNTER — Ambulatory Visit: Payer: No Typology Code available for payment source | Admitting: Sports Medicine

## 2022-05-24 ENCOUNTER — Encounter: Payer: Self-pay | Admitting: Sports Medicine

## 2022-05-24 NOTE — Progress Notes (Unsigned)
   Established Patient Office Visit  Subjective   Patient ID: Melanie [name], female   DOB: Jul 15, 1965 Age: 56 y.o. MRN: [id number]   No chief complaint on file.   HPI    Objective:    There were no vitals filed for this visit.  Physical Exam   No results found for this or any previous visit (from the past 24 hour(s)).   {Labs (Optional):23779}  The 10-year ASCVD risk score (Arnett DK, et al., 2019) is: 5.6%   Values used to calculate the score:     Age: 56 years     Sex: Female     Is Non-Hispanic African American: No     Diabetic: Yes     Tobacco smoker: No     Systolic Blood Pressure: 315 mmHg     Is BP treated: Yes     HDL Cholesterol: 63 mg/dL     Total Cholesterol: 246 mg/dL   Assessment & Plan:   No problem-specific Assessment & Plan notes found for this encounter.   No follow-ups on file.  ___________________________________________ Clearnce Sorrel, DNP, APRN, FNP-BC [facility] [place]

## 2022-05-25 ENCOUNTER — Other Ambulatory Visit (HOSPITAL_COMMUNITY): Payer: Self-pay

## 2022-05-25 ENCOUNTER — Encounter: Payer: Self-pay | Admitting: Medical-Surgical

## 2022-05-25 ENCOUNTER — Ambulatory Visit (INDEPENDENT_AMBULATORY_CARE_PROVIDER_SITE_OTHER): Payer: No Typology Code available for payment source | Admitting: Medical-Surgical

## 2022-05-25 ENCOUNTER — Other Ambulatory Visit (HOSPITAL_BASED_OUTPATIENT_CLINIC_OR_DEPARTMENT_OTHER): Payer: Self-pay

## 2022-05-25 VITALS — BP 120/78 | HR 67 | Ht 67.0 in | Wt 216.0 lb

## 2022-05-25 DIAGNOSIS — F900 Attention-deficit hyperactivity disorder, predominantly inattentive type: Secondary | ICD-10-CM

## 2022-05-25 DIAGNOSIS — H1013 Acute atopic conjunctivitis, bilateral: Secondary | ICD-10-CM | POA: Diagnosis not present

## 2022-05-25 DIAGNOSIS — E119 Type 2 diabetes mellitus without complications: Secondary | ICD-10-CM

## 2022-05-25 DIAGNOSIS — G4733 Obstructive sleep apnea (adult) (pediatric): Secondary | ICD-10-CM

## 2022-05-25 MED ORDER — ONDANSETRON 8 MG PO TBDP
8.0000 mg | ORAL_TABLET | Freq: Three times a day (TID) | ORAL | 3 refills | Status: DC | PRN
  Filled 2022-05-25: qty 20, 7d supply, fill #0

## 2022-05-25 MED ORDER — MOUNJARO 2.5 MG/0.5ML ~~LOC~~ SOAJ
2.5000 mg | SUBCUTANEOUS | 0 refills | Status: DC
  Filled 2022-05-25: qty 2, 28d supply, fill #0

## 2022-05-25 MED ORDER — LISDEXAMFETAMINE DIMESYLATE 50 MG PO CAPS
50.0000 mg | ORAL_CAPSULE | Freq: Every day | ORAL | 0 refills | Status: DC
  Filled 2022-05-25: qty 90, 90d supply, fill #0

## 2022-05-27 ENCOUNTER — Other Ambulatory Visit (HOSPITAL_COMMUNITY): Payer: Self-pay

## 2022-05-31 ENCOUNTER — Other Ambulatory Visit (HOSPITAL_COMMUNITY): Payer: Self-pay

## 2022-05-31 ENCOUNTER — Other Ambulatory Visit: Payer: Self-pay | Admitting: Medical-Surgical

## 2022-05-31 DIAGNOSIS — Z9109 Other allergy status, other than to drugs and biological substances: Secondary | ICD-10-CM

## 2022-05-31 LAB — SYNOVIAL FLUID ANALYSIS, COMPLETE
Basophils, %: 0 %
Eosinophils-Synovial: 0 % (ref 0–2)
Lymphocytes-Synovial Fld: 57 % (ref 0–74)
Monocyte/Macrophage: 39 % (ref 0–69)
Neutrophil, Synovial: 4 % (ref 0–24)
Synoviocytes, %: 0 % (ref 0–15)
WBC, Synovial: 869 cells/uL — ABNORMAL HIGH (ref <150)

## 2022-05-31 LAB — ANAEROBIC AND AEROBIC CULTURE
AER RESULT:: NO GROWTH
GRAM STAIN:: NONE SEEN
MICRO NUMBER:: 14199541
MICRO NUMBER:: 14199542
SPECIMEN QUALITY:: ADEQUATE
SPECIMEN QUALITY:: ADEQUATE

## 2022-05-31 LAB — LYME DISEASE (BORRELIA SPP) DNA, QUALITATIVE REAL-TIME PCR, D: LYME DISEASE (BORRELIA SPP)DNA,QL RT PCR,BLOOD: NOT DETECTED

## 2022-05-31 MED ORDER — LEVOCETIRIZINE DIHYDROCHLORIDE 5 MG PO TABS
5.0000 mg | ORAL_TABLET | Freq: Every evening | ORAL | 1 refills | Status: DC
  Filled 2022-05-31: qty 90, 90d supply, fill #0
  Filled 2022-09-14: qty 30, 30d supply, fill #1

## 2022-06-01 ENCOUNTER — Telehealth: Payer: Self-pay

## 2022-06-01 NOTE — Telephone Encounter (Addendum)
Initiated Prior authorization [id number] 2.5MG/0.5ML pen-injectors Via: Covermymeds Case/[id number] : Status: approved  as of 06/01/22 Reason:The request has been approved. The authorization is effective from 06/01/2022 to 06/01/2023, as long as the member is enrolled in their current health plan. The request was approved as submitted. Notified Pt via: Mychart

## 2022-06-02 ENCOUNTER — Ambulatory Visit: Payer: No Typology Code available for payment source | Admitting: Medical-Surgical

## 2022-06-03 ENCOUNTER — Other Ambulatory Visit (HOSPITAL_COMMUNITY): Payer: Self-pay

## 2022-06-03 ENCOUNTER — Encounter (HOSPITAL_COMMUNITY): Payer: Self-pay

## 2022-06-07 ENCOUNTER — Other Ambulatory Visit (HOSPITAL_COMMUNITY): Payer: Self-pay

## 2022-06-14 ENCOUNTER — Encounter: Payer: No Typology Code available for payment source | Admitting: Physical Medicine and Rehabilitation

## 2022-06-20 ENCOUNTER — Encounter: Payer: Self-pay | Admitting: Sports Medicine

## 2022-06-21 ENCOUNTER — Ambulatory Visit: Payer: Self-pay | Admitting: Sports Medicine

## 2022-06-22 ENCOUNTER — Encounter: Payer: No Typology Code available for payment source | Admitting: Physical Medicine and Rehabilitation

## 2022-06-29 ENCOUNTER — Other Ambulatory Visit (HOSPITAL_COMMUNITY): Payer: Self-pay

## 2022-07-05 ENCOUNTER — Ambulatory Visit: Payer: Self-pay | Admitting: Sports Medicine

## 2022-07-07 DIAGNOSIS — M25511 Pain in right shoulder: Secondary | ICD-10-CM | POA: Diagnosis not present

## 2022-07-07 DIAGNOSIS — R2689 Other abnormalities of gait and mobility: Secondary | ICD-10-CM | POA: Diagnosis not present

## 2022-07-07 DIAGNOSIS — M25561 Pain in right knee: Secondary | ICD-10-CM | POA: Diagnosis not present

## 2022-07-07 DIAGNOSIS — M5459 Other low back pain: Secondary | ICD-10-CM | POA: Diagnosis not present

## 2022-07-07 DIAGNOSIS — M542 Cervicalgia: Secondary | ICD-10-CM | POA: Diagnosis not present

## 2022-07-08 ENCOUNTER — Encounter: Payer: Self-pay | Admitting: Physical Medicine and Rehabilitation

## 2022-07-08 NOTE — Progress Notes (Unsigned)
Office Visit Note  Patient: Melanie [name]             Date of Birth: 01-23-1966           MRN: [id number]             PCP: Christen Butter, NP Referring: Christen Butter, NP Visit Date: 07/11/2022 Occupation: @GUAROCC @  Subjective:  Pain in multiple joints   History of Present Illness: Melanie [name] is a 57 y.o. female seen in consultation per request of her PCP.  According the patient her symptoms started in fifth grade with dry mouth.  She states at the time she had salivary gland biopsy which was lost.  Her symptoms of dry mouth improved over time.  As a teenager in her 64s she started having pain and discomfort in multiple joints and increased fatigue  She states that she was advised that her fatigue was related to allergies.  She has used CPAP for the last 20 years.  She states she has had joint pain off and on since she was in her 32s.  She had positive ANA and elevated sedimentation rate in the past.  She is seen several rheumatologist in the past.  She states that no diagnosis of autoimmune disease was ever given.  She was never given any treatment.  She also gets infrequent rashes.  She has seen dermatologist in the past and has been told that she has atopic dermatitis and eczema.  She has had recurrent bursitis in her hips.  She was also given the diagnosis of fibromyalgia several years ago.  She takes low-dose naltrexone.  She had knee joint discomfort for many years.  She underwent right knee joint surgery for plica in July 2023.  She had a steroid injections in the past and then underwent right knee joint surgery.  She states she has had aspiration of her right knee joint twice since then.  She had some relief from the surgery.  She is also had neck pain discomfort for many years.  She underwent cervical spine surgery in August 2023 by Dr. September 2023.  She has chronic lower back pain.  She goes to physical therapy and gets dry needling.  She also has thoracic scoliosis which causes  discomfort.  Today she Describes discomfort in her shoulders.  She states she had right shoulder joint and calcification which causes chronic pain.  She has off-and-on tendinitis in her elbows.  Her right wrist joint pops.  She has discomfort in her both hands more prominent in the right hand.  She had right SI joint discomfort.  She had pain in her right ankle and her feet.  She seen a podiatrist and had x-rays.  The x-rays showed degenerative changes.  There is no family history of autoimmune disease.  There is no personal or family history of psoriasis.  She is gravida 0.    Activities of Daily Living:  Patient reports morning stiffness for several hours.   Patient Reports nocturnal pain.  Difficulty dressing/grooming: Reports Difficulty climbing stairs: Reports Difficulty getting out of chair: Reports Difficulty using hands for taps, buttons, cutlery, and/or writing: Reports  Review of Systems  Constitutional:  Positive for fatigue.  HENT:  Positive for mouth dryness and nose dryness. Negative for mouth sores.   Eyes:  Positive for dryness.  Respiratory:  Negative for difficulty breathing.   Cardiovascular:  Positive for palpitations. Negative for chest pain.       EKG normal per patient  Gastrointestinal:  Positive for constipation and diarrhea. Negative for blood in stool.  Endocrine: Negative for increased urination.  Genitourinary:  Negative for involuntary urination.  Musculoskeletal:  Positive for joint pain, gait problem, joint pain, joint swelling, myalgias, muscle weakness, morning stiffness, muscle tenderness and myalgias.  Skin:  Positive for rash, hair loss and sensitivity to sunlight. Negative for color change.  Allergic/Immunologic: Negative for susceptible to infections.  Neurological:  Positive for dizziness, numbness and headaches.  Hematological:  Negative for swollen glands.  Psychiatric/Behavioral:  Positive for depressed mood. Negative for sleep disturbance. The  patient is nervous/anxious.     PMFS History:  Patient Active Problem List   Diagnosis Date Noted   Allergic conjunctivitis of both eyes 05/25/2022   Acute medial meniscus tear of right knee    Plica syndrome of knee, right    Carpal tunnel syndrome, right 06/23/2021   HLD (hyperlipidemia) 04/29/2020   Type 2 diabetes mellitus without complication, without long-term current use of insulin (HCC) 04/24/2020   Tenosynovitis, de Quervain 06/24/2019   Primary osteoarthritis of first carpometacarpal joint of right hand 03/18/2019   Chronic right shoulder pain 03/18/2019   Bilateral foot pain 03/18/2019   Primary osteoarthritis of both knees 03/18/2019   Fibromyalgia 03/15/2019   Lumbar degenerative disc disease 12/19/2018   Myalgia 11/27/2018   Morning stiffness of joints 11/27/2018   DDD (degenerative disc disease), cervical 11/27/2018   Attention deficit hyperactivity disorder (ADHD), predominantly inattentive type 02/27/2018   Arthralgia 02/27/2018   Hypothyroidism 02/27/2018   NASH (nonalcoholic steatohepatitis) 02/27/2018   GAD (generalized anxiety disorder) 01/06/2017   Moderate episode of recurrent major depressive disorder (HCC) 01/06/2017   Environmental allergies 09/12/2015   Essential hypertension 09/12/2015   OSA on CPAP 09/12/2015    Past Medical History:  Diagnosis Date   ADHD (attention deficit hyperactivity disorder)    Arthritis    Bowel obstruction (HCC)    Constipation 07/01/2020   Diabetes mellitus without complication (HCC)    Diverticulosis    Fibromyalgia    H/O: hysterectomy 02/27/2018   History of prediabetes 02/27/2018   Hypertension    Post-operative nausea and vomiting    SBO (small bowel obstruction) (HCC) 04/29/2020   Sleep apnea    uses CPAP    Family History  Problem Relation Age of Onset   Diabetes Mother    Skin cancer Mother    Lung cancer Father    Skin cancer Sister    Colon cancer Neg Hx    Colon polyps Neg Hx    Esophageal  cancer Neg Hx    Rectal cancer Neg Hx    Stomach cancer Neg Hx    Past Surgical History:  Procedure Laterality Date   ABDOMINAL HYSTERECTOMY     ANTERIOR CERVICAL DECOMP/DISCECTOMY FUSION N/A 02/02/2022   Procedure: ANTERIOR CERVICAL DECOMPRESSION AND FUSION CERVICAL 5- CERVICAL 6, CERVICAL 6- CERVICAL 7 WITH INSTRUMENTATION AND ALLOGRAFT;  Surgeon: Estill Bamberg, MD;  Location: [facility] OR;  Service: Orthopedics;  Laterality: N/A;   COLONOSCOPY  20 years ago    in =diverticulosis    KNEE ARTHROSCOPY WITH EXCISION PLICA Right 01/18/2022   Procedure: RIGHT KNEE ARTHROSCOPY WITH EXCISION PLICA;  Surgeon: Huel Cote, MD;  Location: [facility];  Service: Orthopedics;  Laterality: Right;   LAPAROSCOPY     due to bowel perforation   MYOMECTOMY     OOPHORECTOMY     unilateral   Social History   Social History Narrative   Not on file  Immunization History  Administered Date(s) Administered   DTaP 06/25/2010   Influenza, Seasonal, Injecte, Preservative Fre 04/13/2016   Influenza,inj,Quad PF,6+ Mos 04/23/2018, 03/14/2019, 04/10/2020   Influenza-Unspecified 03/26/2021   PFIZER(Purple Top)SARS-COV-2 Vaccination 07/06/2019, 07/24/2019, 04/03/2020   PPD Test 08/21/2009, 04/03/2017, 05/01/2017   Pneumococcal Polysaccharide-23 07/27/2020   Tdap 11/26/2015   Zoster Recombinat (Shingrix) 06/26/2019, 06/24/2020     Objective: Vital Signs: BP (!) 134/92 (BP Location: Left Arm, Patient Position: Sitting, Cuff Size: Large)   Pulse 77   Resp 18   Ht 5' 6.25" (1.683 m)   Wt 220 lb 12.8 oz (100.2 kg)   BMI 35.37 kg/m    Physical Exam Vitals and nursing note reviewed.  Constitutional:      Appearance: She is well-developed.  HENT:     Head: Normocephalic and atraumatic.  Eyes:     Conjunctiva/sclera: Conjunctivae normal.  Cardiovascular:     Rate and Rhythm: Normal rate and regular rhythm.     Heart sounds: Normal heart sounds.  Pulmonary:     Effort: Pulmonary  effort is normal.     Breath sounds: Normal breath sounds.  Abdominal:     General: Bowel sounds are normal.     Palpations: Abdomen is soft.  Musculoskeletal:     Cervical back: Normal range of motion.  Lymphadenopathy:     Cervical: No cervical adenopathy.  Skin:    General: Skin is warm and dry.     Capillary Refill: Capillary refill takes less than 2 seconds.  Neurological:     Mental Status: She is alert and oriented to person, place, and time.  Psychiatric:        Behavior: Behavior normal.      Musculoskeletal Exam: She had cervical spine surgery.  She had good range of motion of the cervical spine.  She had limited range of motion of thoracic and lumbar spine.  Shoulder joints, elbow joints, wrist joints, MCPs PIPs and DIPs been good range of motion with no synovitis.  Mild thickening of the DIP joints was noted.  Hip joints were in good range of motion without discomfort.  She had mild swelling and warmth on palpation of her right knee joint without effusion.  Left knee joint was in good range of motion.  There was no tenderness over ankles or MTPs.  No synovitis or dactylitis was noted.  CDAI Exam: CDAI Score: -- Patient Global: --; Provider Global: -- Swollen: --; Tender: -- Joint Exam 07/11/2022   No joint exam has been documented for this visit   There is currently no information documented on the homunculus. Go to the Rheumatology activity and complete the homunculus joint exam.  Investigation: No additional findings.  Imaging: No results found.  Recent Labs: Lab Results  Component Value Date   WBC 5.5 01/28/2022   HGB 14.4 01/28/2022   PLT 343 01/28/2022   NA 138 01/14/2022   K 3.7 01/14/2022   CL 105 01/14/2022   CO2 24 01/14/2022   GLUCOSE 119 (H) 01/14/2022   BUN 12 01/14/2022   CREATININE 0.69 01/14/2022   BILITOT 0.7 12/17/2021   ALKPHOS 78 09/24/2020   AST 16 12/17/2021   ALT 24 12/17/2021   PROT 6.9 12/17/2021   ALBUMIN 4.1 09/24/2020    CALCIUM 9.2 01/14/2022   GFRAA 116 04/14/2020    Speciality Comments: No specialty comments available.  Procedures:  No procedures performed Allergies: Morphine, Sulfa antibiotics, and Clarithromycin   Assessment / Plan:     Visit Diagnoses:  Polyarthralgia - 09/24/20: HLA-B27-, anti-CCP<16, ESR 17, ANA1:40NH, 1:80cytoplasmic, complements WNL, dsDNA-, ribosomal P-, SM-, SM/RNP-, Ro-, La-, TPO-, Scl-70-, RF<14 -patient complains of pain and discomfort in multiple joints since she was in her 69s.  She states the pain has been gradually getting worse.  She has seen several rheumatologist in the past and autoimmune workup has been negative.  She gives history of intermittent swelling in her joints.  No synovitis was noted on the examination today.  She had labs last year which were reviewed with the patient.  All autoimmune workup was negative.  She is having ongoing symptoms I will repeat autoimmune antibodies today.  Plan: Sedimentation rate, Rheumatoid factor, Cyclic citrul peptide antibody, IgG,   Chronic pain of right knee-she complains of ongoing pain and discomfort in her right knee joint.  She had surgery for plica in July 6063.  She states she had some relief after the surgery but is still has ongoing discomfort.  She had warmth on palpation of her right knee joint.  Effusion, right knee - synovial fluid analysis 05/19/22: WBC 869, no crystals, culture negative  Primary osteoarthritis of both knees-I do not have x-rays available but she has been told that she has osteoarthritis in her both knees.  Plica syndrome of knee, right - Status post surgery and and postoperative pain.  Primary osteoarthritis of first carpometacarpal joint of right hand-patient states that she has seen doctors at [facility] for hand joint discomfort.  She had x-rays which were unremarkable.  She was told that she had some arthritis in the right [facility] joint.  She also had de Quervain's tenosynovitis in the  past.  Carpal tunnel syndrome, right-she gives history of right carpal tunnel syndrome.  She has intermittent symptoms.  Pain in both feet-she gives history of pain and discomfort in her bilateral feet.  She had no synovitis on the examination.  She gives history of intermittent swelling.  She was evaluated by a podiatrist.  She had x-rays of her bilateral feet which showed degenerative changes.  X-rays were reviewed with the patient.  DDD (degenerative disc disease), cervical-she had cervical spine decompression discectomy fusion in August 2023.  She had good range of motion without discomfort.  She has some trapezius discomfort.  Rash-she gives history of intermittent rash in different parts of her body.  She brought some pictures of the rash which appears to be consistent with contact dermatitis or atopic dermatitis.  Patient states she has seen a dermatologist in the past and was told that she had atopic dermatitis.  Dry mouth-she gives history of dry mouth since she was in fifth grade.  She states her symptoms fluctuate.  She is experiencing dry eyes, and dry nose no.  Sjogren's antibodies were negative in the past.  She is on multiple medications which could be contributing to dry mouth and dry eyes.  She also uses CPAP.  Lumbar degenerative disc disease-patient states she has been diagnosed with degenerative disc disease of the lumbar spine.  X-rays of lumbar spine from September 24, 2020 showed mild levoscoliosis in the thoracic spine.  She had moderate to severe disc height loss at L5-S1 level.  Fibromyalgia-she was diagnosed with fibromyalgia syndrome in the past.  She has generalized pain, hyperalgesia and multiple tender points.  She has been going to physical therapy.  Benefit of water aerobics and exercise and stretching were discussed.  Other medical problems are listed as follows:  Essential hypertension-blood pressure was mildly elevated at 134/92.  She was  advised to monitor blood  pressure closely and follow-up with the PCP.  Mixed hyperlipidemia  Type 2 diabetes mellitus without complication, without long-term current use of insulin (HCC)  NASH (nonalcoholic steatohepatitis)  History of hypothyroidism  OSA on CPAP  Attention deficit hyperactivity disorder (ADHD), predominantly inattentive type  GAD (generalized anxiety disorder)  Moderate episode of recurrent major depressive disorder (HCC)  Orders: Orders Placed This Encounter  Procedures   Sedimentation rate   Rheumatoid factor   Cyclic citrul peptide antibody, IgG   ANA   No orders of the defined types were placed in this encounter.   Face-to-face time spent with patient was over 50 minutes. Greater than 50% of time was spent in counseling and coordination of care.  Follow-Up Instructions: Return for pain in multiple joints.   Pollyann Savoy, MD  Note - This record has been created using Animal nutritionist.  Chart creation errors have been sought, but may not always  have been located. Such creation errors do not reflect on  the standard of medical care.

## 2022-07-11 ENCOUNTER — Ambulatory Visit: Payer: 59 | Attending: Rheumatology | Admitting: Rheumatology

## 2022-07-11 ENCOUNTER — Encounter: Payer: Self-pay | Admitting: Rheumatology

## 2022-07-11 VITALS — BP 134/92 | HR 77 | Resp 18 | Ht 66.25 in | Wt 220.8 lb

## 2022-07-11 DIAGNOSIS — Z8639 Personal history of other endocrine, nutritional and metabolic disease: Secondary | ICD-10-CM

## 2022-07-11 DIAGNOSIS — G5601 Carpal tunnel syndrome, right upper limb: Secondary | ICD-10-CM | POA: Diagnosis not present

## 2022-07-11 DIAGNOSIS — M25461 Effusion, right knee: Secondary | ICD-10-CM | POA: Diagnosis not present

## 2022-07-11 DIAGNOSIS — M797 Fibromyalgia: Secondary | ICD-10-CM | POA: Diagnosis not present

## 2022-07-11 DIAGNOSIS — M6751 Plica syndrome, right knee: Secondary | ICD-10-CM

## 2022-07-11 DIAGNOSIS — E782 Mixed hyperlipidemia: Secondary | ICD-10-CM

## 2022-07-11 DIAGNOSIS — M5030 Other cervical disc degeneration, unspecified cervical region: Secondary | ICD-10-CM

## 2022-07-11 DIAGNOSIS — F411 Generalized anxiety disorder: Secondary | ICD-10-CM

## 2022-07-11 DIAGNOSIS — R21 Rash and other nonspecific skin eruption: Secondary | ICD-10-CM | POA: Diagnosis not present

## 2022-07-11 DIAGNOSIS — M1811 Unilateral primary osteoarthritis of first carpometacarpal joint, right hand: Secondary | ICD-10-CM

## 2022-07-11 DIAGNOSIS — M25561 Pain in right knee: Secondary | ICD-10-CM | POA: Diagnosis not present

## 2022-07-11 DIAGNOSIS — M79672 Pain in left foot: Secondary | ICD-10-CM

## 2022-07-11 DIAGNOSIS — E119 Type 2 diabetes mellitus without complications: Secondary | ICD-10-CM

## 2022-07-11 DIAGNOSIS — M170 Bilateral primary osteoarthritis of knee: Secondary | ICD-10-CM

## 2022-07-11 DIAGNOSIS — F900 Attention-deficit hyperactivity disorder, predominantly inattentive type: Secondary | ICD-10-CM

## 2022-07-11 DIAGNOSIS — F331 Major depressive disorder, recurrent, moderate: Secondary | ICD-10-CM

## 2022-07-11 DIAGNOSIS — I10 Essential (primary) hypertension: Secondary | ICD-10-CM

## 2022-07-11 DIAGNOSIS — R682 Dry mouth, unspecified: Secondary | ICD-10-CM | POA: Diagnosis not present

## 2022-07-11 DIAGNOSIS — M79671 Pain in right foot: Secondary | ICD-10-CM

## 2022-07-11 DIAGNOSIS — M51369 Other intervertebral disc degeneration, lumbar region without mention of lumbar back pain or lower extremity pain: Secondary | ICD-10-CM

## 2022-07-11 DIAGNOSIS — M5136 Other intervertebral disc degeneration, lumbar region: Secondary | ICD-10-CM | POA: Diagnosis not present

## 2022-07-11 DIAGNOSIS — G4733 Obstructive sleep apnea (adult) (pediatric): Secondary | ICD-10-CM

## 2022-07-11 DIAGNOSIS — M2550 Pain in unspecified joint: Secondary | ICD-10-CM | POA: Diagnosis not present

## 2022-07-11 DIAGNOSIS — K7581 Nonalcoholic steatohepatitis (NASH): Secondary | ICD-10-CM

## 2022-07-11 DIAGNOSIS — G8929 Other chronic pain: Secondary | ICD-10-CM

## 2022-07-12 ENCOUNTER — Ambulatory Visit: Payer: Self-pay | Admitting: Sports Medicine

## 2022-07-13 ENCOUNTER — Encounter: Payer: Self-pay | Admitting: Medical-Surgical

## 2022-07-13 LAB — ANTI-NUCLEAR AB-TITER (ANA TITER)
ANA TITER: 1:40 {titer} — ABNORMAL HIGH
ANA Titer 1: 1:40 {titer} — ABNORMAL HIGH

## 2022-07-13 LAB — ANA: Anti Nuclear Antibody (ANA): POSITIVE — AB

## 2022-07-13 LAB — SEDIMENTATION RATE: Sed Rate: 11 mm/h (ref 0–30)

## 2022-07-13 LAB — CYCLIC CITRUL PEPTIDE ANTIBODY, IGG: Cyclic Citrullin Peptide Ab: <16 UNITS

## 2022-07-13 LAB — RHEUMATOID FACTOR: Rheumatoid fact SerPl-aCnc: <14 IU/mL (ref <14)

## 2022-07-13 NOTE — Progress Notes (Signed)
I will discuss results at the follow-up visit.

## 2022-07-20 NOTE — Progress Notes (Signed)
Office Visit Note  Patient: Melanie [name]             Date of Birth: 03/31/1966           MRN: [id number]             PCP: Samuel Bouche, NP Referring: Samuel Bouche, NP Visit Date: 08/02/2022 Occupation: @GUAROCC @  Subjective:  Pain in multiple joints   History of Present Illness: Melanie [name] is a 57 y.o. female with history of polyarthralgia, osteoarthritis, degenerative disc disease and fibromyalgia syndrome.  She returns for follow-up visit.  She states she continues to have generalized pain and discomfort in multiple joints and tendons.  She continues to have discomfort in her right shoulder, bilateral hands, right knee, right ankle.  She states she is in constant pain and discomfort.  She is frustrated as she is not getting much help.  She states she has recurrent tendinopathy in her right shoulder.  She feels discomfort now in all the joints and muscles.  She states she developed a rash on her left arm.  She has been having recurrent rash.  She also has noticed that she has tinnitus.  She states she has seen an audiologist in the past.  She is followed by sports medicine.    Activities of Daily Living:  Patient reports morning stiffness for 3 hours.   Patient Reports nocturnal pain.  Difficulty dressing/grooming: Reports Difficulty climbing stairs: Reports Difficulty getting out of chair: Reports Difficulty using hands for taps, buttons, cutlery, and/or writing: Reports  Review of Systems  Constitutional:  Positive for fatigue.  HENT:  Positive for mouth dryness. Negative for mouth sores.   Eyes:  Positive for dryness.  Respiratory:  Negative for shortness of breath.   Cardiovascular:  Positive for palpitations. Negative for chest pain.  Gastrointestinal:  Positive for constipation and diarrhea. Negative for blood in stool.  Endocrine: Negative for increased urination.  Genitourinary:  Negative for involuntary urination.  Musculoskeletal:  Positive for joint  pain, gait problem, joint pain, joint swelling, myalgias, muscle weakness, morning stiffness, muscle tenderness and myalgias.  Skin:  Positive for color change, rash, hair loss and sensitivity to sunlight.  Allergic/Immunologic: Positive for susceptible to infections.  Neurological:  Positive for dizziness and headaches.  Hematological:  Negative for swollen glands.  Psychiatric/Behavioral:  Negative for depressed mood and sleep disturbance. The patient is not nervous/anxious.     PMFS History:  Patient Active Problem List   Diagnosis Date Noted   Allergic conjunctivitis of both eyes 05/25/2022   Acute medial meniscus tear of right knee    Plica syndrome of knee, right    Carpal tunnel syndrome, right 06/23/2021   HLD (hyperlipidemia) 04/29/2020   Type 2 diabetes mellitus without complication, without long-term current use of insulin ([place]) 04/24/2020   Tenosynovitis, de Quervain 06/24/2019   Primary osteoarthritis of first carpometacarpal joint of right hand 03/18/2019   Chronic right shoulder pain 03/18/2019   Bilateral foot pain 03/18/2019   Primary osteoarthritis of both knees 03/18/2019   Fibromyalgia 03/15/2019   Lumbar degenerative disc disease 12/19/2018   Myalgia 11/27/2018   Morning stiffness of joints 11/27/2018   DDD (degenerative disc disease), cervical 11/27/2018   Attention deficit hyperactivity disorder (ADHD), predominantly inattentive type 02/27/2018   Arthralgia 02/27/2018   Hypothyroidism 02/27/2018   NASH (nonalcoholic steatohepatitis) 02/27/2018   GAD (generalized anxiety disorder) 01/06/2017   Moderate episode of recurrent major depressive disorder ([place]) 01/06/2017   Environmental allergies 09/12/2015  Essential hypertension 09/12/2015   OSA on CPAP 09/12/2015    Past Medical History:  Diagnosis Date   ADHD (attention deficit hyperactivity disorder)    Arthritis    Bowel obstruction (HCC)    Constipation 07/01/2020   Diabetes mellitus without  complication (HCC)    Diverticulosis    Fibromyalgia    H/O: hysterectomy 02/27/2018   History of prediabetes 02/27/2018   Hypertension    Post-operative nausea and vomiting    SBO (small bowel obstruction) ([place]) 04/29/2020   Sleep apnea    uses CPAP    Family History  Problem Relation Age of Onset   Diabetes Mother    Skin cancer Mother    Lung cancer Father    Skin cancer Sister    Colon cancer Neg Hx    Colon polyps Neg Hx    Esophageal cancer Neg Hx    Rectal cancer Neg Hx    Stomach cancer Neg Hx    Past Surgical History:  Procedure Laterality Date   ABDOMINAL HYSTERECTOMY     ANTERIOR CERVICAL DECOMP/DISCECTOMY FUSION N/A 02/02/2022   Procedure: ANTERIOR CERVICAL DECOMPRESSION AND FUSION CERVICAL 5- CERVICAL 6, CERVICAL 6- CERVICAL 7 WITH INSTRUMENTATION AND ALLOGRAFT;  Surgeon: Phylliss Bob, MD;  Location: [facility];  Service: Orthopedics;  Laterality: N/A;   COLONOSCOPY  20 years ago    in [place]=diverticulosis    KNEE ARTHROSCOPY WITH EXCISION PLICA Right 08/15/9415   Procedure: RIGHT KNEE ARTHROSCOPY WITH EXCISION PLICA;  Surgeon: Vanetta Mulders, MD;  Location: [facility];  Service: Orthopedics;  Laterality: Right;   LAPAROSCOPY     due to bowel perforation   MYOMECTOMY     OOPHORECTOMY     unilateral   Social History   Social History Narrative   Not on file   Immunization History  Administered Date(s) Administered   DTaP 06/25/2010   Influenza, Seasonal, Injecte, Preservative Fre 04/13/2016   Influenza,inj,Quad PF,6+ Mos 04/23/2018, 03/14/2019, 04/10/2020   Influenza-Unspecified 03/26/2021   PFIZER(Purple Top)SARS-COV-2 Vaccination 07/06/2019, 07/24/2019, 04/03/2020   PPD Test 08/21/2009, 04/03/2017, 05/01/2017   Pneumococcal Polysaccharide-23 07/27/2020   Tdap 11/26/2015   Zoster Recombinat (Shingrix) 06/26/2019, 06/24/2020     Objective: Vital Signs: BP 129/79 (BP Location: Left Arm, Patient Position: Sitting, Cuff Size: Large)   Pulse  80   Resp 14   Ht 5' 6.25" (1.683 m)   Wt 219 lb (99.3 kg)   BMI 35.08 kg/m    Physical Exam Vitals and nursing note reviewed.  Constitutional:      Appearance: She is well-developed.  HENT:     Head: Normocephalic and atraumatic.  Eyes:     Conjunctiva/sclera: Conjunctivae normal.  Cardiovascular:     Rate and Rhythm: Normal rate and regular rhythm.     Heart sounds: Normal heart sounds.  Pulmonary:     Effort: Pulmonary effort is normal.     Breath sounds: Normal breath sounds.  Abdominal:     General: Bowel sounds are normal.     Palpations: Abdomen is soft.  Musculoskeletal:     Cervical back: Normal range of motion.  Lymphadenopathy:     Cervical: No cervical adenopathy.  Skin:    General: Skin is warm and dry.     Capillary Refill: Capillary refill takes less than 2 seconds.     Comments: Fine vesicular lesions with erythema was noted over left forearm.  Neurological:     Mental Status: She is alert and oriented to person, place, and time.  Psychiatric:        Behavior: Behavior normal.      Musculoskeletal Exam: She had discomfort with range of motion of the cervical spine.  She had limited range of motion of thoracic lumbar spine.  She had full range of motion of the shoulder joints with discomfort.  Elbow joints and wrist joints were in good range of motion.  She had discomfort range of motion of her right wrist joint.  She had no MCP synovitis.  Bilateral PIP and DIP thickening was noted.  Hip joints and knee joints with good range of motion.  She had warmth and swelling of the right knee joint.  She had tenderness on anterior aspect of right medial malleolus.  No plantar fasciitis or Achilles tendinitis was noted.  No dactylitis was noted.  CDAI Exam: CDAI Score: -- Patient Global: --; Provider Global: -- Swollen: --; Tender: -- Joint Exam 08/02/2022   No joint exam has been documented for this visit   There is currently no information documented on the  homunculus. Go to the Rheumatology activity and complete the homunculus joint exam.  Investigation: No additional findings.  Imaging: No results found.  Recent Labs: Lab Results  Component Value Date   WBC 5.5 01/28/2022   HGB 14.4 01/28/2022   PLT 343 01/28/2022   NA 138 01/14/2022   K 3.7 01/14/2022   CL 105 01/14/2022   CO2 24 01/14/2022   GLUCOSE 119 (H) 01/14/2022   BUN 12 01/14/2022   CREATININE 0.69 01/14/2022   BILITOT 0.7 12/17/2021   ALKPHOS 78 09/24/2020   AST 16 12/17/2021   ALT 24 12/17/2021   PROT 6.9 12/17/2021   ALBUMIN 4.1 09/24/2020   CALCIUM 9.2 01/14/2022   GFRAA 116 04/14/2020   July 11, 2022 ANA 1: 40 cytoplasmic, speckled, RF negative, anti-CCP negative, sed rate 11  09/24/20: HLA-B27-, anti-CCP<16, ESR 17, ANA1:40NH, 1:80cytoplasmic, complements WNL, dsDNA-, ribosomal P-, SM-, SM/RNP-, Ro-, La-, TPO-, Scl-70-, RF<14   Speciality Comments: No specialty comments available.  Procedures:  No procedures performed Allergies: Morphine, Sulfa antibiotics, and Clarithromycin   Assessment / Plan:     Visit Diagnoses: Polyarthralgia - History of pain in multiple joints she said she was in her 57s.  She has seen rheumatologist in the past.  All autoimmune workup negative in March 2022 and again in January 2024.  Recent labs obtained including ANA, rheumatoid factor, anti-CCP and sed rate were normal.  Labs were discussed with the patient.  Patient seems dissatisfied as she continues to have pain and discomfort and does not have any answers.  No synovitis was noted on the examination.  I could not establish a diagnosis of autoimmune disease today.  Patient believes that she has an autoimmune process which is leading to pain and discomfort in multiple joints and intermittent swelling.  I discussed the option of referral to [facility].  Patient was in agreement.  A referral to to [facility] rheumatology was placed.  Chronic pain of right  knee - Plica surgery July 2023.  She had some relief after the surgery but the pain persist.  Effusion, right knee - Synovial fluid not inflammatory.  Patient is followed by orthopedics and sports medicine.  I reviewed the MRI from May 18, 2022 which showed moderate knee joint effusion.  She also had new diffuse free edge irregularity of the posterior horn and body of the medial meniscus.  There was questionable new radial tear in.  Patient is followed  by sports medicine.  Plica syndrome of knee, right  Primary osteoarthritis of both knees - No x-rays were available to confirm the diagnosis.  Patient states that her x-rays are done by orthopedic surgery.  I found old x-ray of the right knee joint from June 2023 which showed only mild osteoarthritis.  Primary osteoarthritis of first carpometacarpal joint of right hand - Followed at [facility].  Bilateral PIP and DIP and CMC thickening was noted.  No synovitis was noted over wrist joints and MCP joints.  Patient complains of discomfort in her right wrist joint.  I do not have x-rays available.  Patient states that she had x-rays at [facility].  Carpal tunnel syndrome, right - She has intermittent symptoms  Pain in both feet - Degenerative changes were noted on the x-rays.  She gives history of intermittent swelling.  No synovitis was noted.  She was evaluated by a podiatrist.  She complains of tenderness over the right medial malleolus.  DDD (degenerative disc disease), cervical -chronic discomfort.  She had discomfort raising her arms and also discomfort in the trapezius region.  Cervical spine decompression discectomy and fusion per patient in August 2023.  Lumbar degenerative disc disease -she has chronic lower back pain.  X-rays showed mild levoscoliosis and L5-S1 disc space narrowing.  Rash - History of recurrent rash.  She had rash on her left forearm today with fine vesicular lesions and erythema.  The findings are consistent with  contact dermatitis.  I advised her to schedule an appointment with her dermatologist.  Dry mouth - SSA and SSB antibodies negative.  She uses CPAP which makes her mouth dry.  Fibromyalgia - History of generalized pain, hyperalgesia.  She had multiple tender points.  She goes to physical therapy.  Tinnitus of both ears-patient reports tinnitus in her both years.  She states she has seen an audiologist and did not have any hearing loss.  I advised her to see ENT for ongoing tinnitus.  Essential hypertension-blood pressure was normal at 129/79.  Other medical problems are listed as follows:  Mixed hyperlipidemia  Type 2 diabetes mellitus without complication, without long-term current use of insulin (HCC)  NASH (nonalcoholic steatohepatitis)  History of hypothyroidism  Attention deficit hyperactivity disorder (ADHD), predominantly inattentive type  Anxiety and depression  OSA on CPAP  Orders: Orders Placed This Encounter  Procedures   Ambulatory referral to Rheumatology   No orders of the defined types were placed in this encounter.   Face-to-face time spent with patient was 50 minutes. Greater than 50% of time was spent in counseling and coordination of care.  Follow-Up Instructions: Return if symptoms worsen or fail to improve, for polyarthralgia.   Bo Merino, MD  Note - This record has been created using Editor, commissioning.  Chart creation errors have been sought, but may not always  have been located. Such creation errors do not reflect on  the standard of medical care.

## 2022-07-21 ENCOUNTER — Other Ambulatory Visit (HOSPITAL_COMMUNITY): Payer: Self-pay

## 2022-07-21 ENCOUNTER — Ambulatory Visit (INDEPENDENT_AMBULATORY_CARE_PROVIDER_SITE_OTHER): Payer: 59 | Admitting: Sports Medicine

## 2022-07-21 DIAGNOSIS — M5459 Other low back pain: Secondary | ICD-10-CM | POA: Diagnosis not present

## 2022-07-21 DIAGNOSIS — M25511 Pain in right shoulder: Secondary | ICD-10-CM | POA: Diagnosis not present

## 2022-07-21 DIAGNOSIS — M797 Fibromyalgia: Secondary | ICD-10-CM

## 2022-07-21 DIAGNOSIS — M7741 Metatarsalgia, right foot: Secondary | ICD-10-CM | POA: Diagnosis not present

## 2022-07-21 DIAGNOSIS — M542 Cervicalgia: Secondary | ICD-10-CM | POA: Diagnosis not present

## 2022-07-21 DIAGNOSIS — M25561 Pain in right knee: Secondary | ICD-10-CM | POA: Diagnosis not present

## 2022-07-21 DIAGNOSIS — G8929 Other chronic pain: Secondary | ICD-10-CM

## 2022-07-21 DIAGNOSIS — M79671 Pain in right foot: Secondary | ICD-10-CM

## 2022-07-21 DIAGNOSIS — M6520 Calcific tendinitis, unspecified site: Secondary | ICD-10-CM | POA: Diagnosis not present

## 2022-07-21 DIAGNOSIS — Q666 Other congenital valgus deformities of feet: Secondary | ICD-10-CM | POA: Diagnosis not present

## 2022-07-21 DIAGNOSIS — M79672 Pain in left foot: Secondary | ICD-10-CM

## 2022-07-21 DIAGNOSIS — R2689 Other abnormalities of gait and mobility: Secondary | ICD-10-CM | POA: Diagnosis not present

## 2022-07-21 NOTE — Progress Notes (Signed)
Melanie [name] - 57 y.o. female MRN [id number]  Date of birth: [birth year]  Office Visit Note: Visit Date: 07/21/2022 PCP: Samuel Bouche, NP Referred by: Samuel Bouche, NP  Subjective: No chief complaint on file.  HPI: Melanie [name] is a very pleasant 57 y.o. female who presents today for follow-up of right knee pain, right shoulder pain, fatigue, foot pain.  Right knee - this has been chronic with history of effusion.  She did undergo partial meniscectomy by Dr. Sammuel Hines in many months ago.  At her previous visit, we did aspirate and proceed with Zilretta injection, she feels like this injection has lasted much longer than the regular corticosteroid injections.  Continues with her copper fit brace for compression.   Right shoulder - has a history of calcific tendinitis.  We did do prior extracorporeal shockwave treatment, which may have been helpful but did have some soreness after this.  Over the last few days she feels like the right shoulder is slightly swollen and warm to the touch, no erythema of the skin.  Bilateral foot pain - she was previously on her feet all the time with her job, continues to be active and remaining active and walking as her pain allows. At times has had numbness and tingling in the feet.  We did recommend an EMG/nerve conduction study for the heaviness of her right leg, although this has been getting better so she feels like she can hold off on the nerve conduction study at this time.  She continues with home exercises and physical therapy twice daily.  Recently saw a rheumatology on 07/11/2022, had repeat blood work that showed positive ANA but remainder of labs were noncontributory.  It seems like there is no true diagnosis of rheumatologic or autoimmune condition at this time.  Fibromyalgia/Chronic fatigue syndrome - this was a diagnosis on her medical history, [place] does acknowledge that she likely has this condition that contributes to some of her  overall fatigue, poor recovery and overall joint/body pains.  However, we both know that she also has separate pathology that contributes to some of her pain as above. She has tried Cymbalta and other SNRIs in the past without much success.  She is managed on low-dose naltrexone 1.5mg .   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  She has done a good job handling on this and focusing on next stages and steps in her life.  She is seeing a chiropractor who does soft tissue manipulation and treatment, feels like this is also helping.  Pertinent ROS were reviewed with the patient and found to be negative unless otherwise specified above in HPI.   Assessment & Plan: Visit Diagnoses:  1. Chronic pain of right knee   2. Metatarsalgia, right foot   3. Bilateral foot pain   4. Fibromyalgia   5. Calcific tendinitis   6. Chronic right shoulder pain   7. Pes planovalgus    Plan: Benjamine Mola and I had a good discussion regarding her multiple conditions.  I do think that it is ultimately best that she transition to a less demanding physical job, as we have seen the pain and body soreness that she gets being on her feet many hours of the day.  We will continue to perform home exercise and PT for the right knee, going forward do think it would be wise to proceed with Zilretta injections as opposed  to regular corticosteroid injection given the prolonged benefit.  In terms of her bilateral foot pain, I do think it would be helpful for her to get supportive orthotics to help with her pes planovalgus as well as the loss of her transverse arch, right greater than left.  We will send her a referral to the sports medicine center to have these made for her.  I did give her the CPT code for to call her insurance to discuss what type of coverage she may get.  We also discussed aquatic physical therapy for her knee, her calcific tendinitis and  overall fibromyalgia, we did send a referral to a location closer to her in [place] area.  We also discussed the possible benefit of seeing a functional medicine doctor who can do more GI map testing and other evaluation that we and her PCP does not evaluate, will look for recommendations and I will send these to her by MyChart.  She will continue her low-dose naltrexone 1.5 mg nightly.  We did have a discussion in the future potentially slowly increasing this to see if we could get her to a more effective dose, and likely would not go above 4.5 mg with her personally. Will follow-up as needed.  Follow-up: As-needed   Meds & Orders: No orders of the defined types were placed in this encounter.   Orders Placed This Encounter  Procedures   AMB referral to sports medicine   Ambulatory referral to Physical Therapy     Procedures: No procedures performed      Clinical History: No specialty comments available.  She reports that she has never smoked. She has been exposed to tobacco smoke. She has never used smokeless tobacco.  Recent Labs    08/10/21 1628 01/28/22 0918  HGBA1C 5.7 5.7*    Objective:    Physical Exam  Gen: Well-appearing, in no acute distress; non-toxic CV: Well-perfused. Warm.  Resp: Breathing unlabored on room air; no wheezing. Psych: Fluid speech in conversation; appropriate affect; normal thought process Neuro: Sensation intact throughout. No gross coordination deficits.   Ortho Exam - Right shoulder: There is full range active and passive range of motion of the shoulder albeit with some pain.  Inspection does reveal some mild warmth and swelling in the subacromial space, no overlying skin redness.  No mechanical blocks to internal/external rotation.  - Right knee: There is a small effusion noted on the knee without significant warmth or redness, although certainly improved from previous exams.  Well-healed arthroscopic scars.  Range of motion 0-130 degrees.   No varus or valgus instability. NVI.  Bilateral feet/Gait: There is bilateral pes planovalgus with hyperpronation through walking phase.  There is also a loss of the transverse arch, right greater than left.  No swelling or edema of either foot.  Neurovascular intact.   Imaging: Narrative & Impression  CLINICAL DATA:  Knee pain with swelling for 2 months. History of right knee surgery four months ago (synovectomy with medial plica resection and partial medial meniscectomy). No recent injury.   EXAM: MRI OF THE RIGHT KNEE WITHOUT CONTRAST   TECHNIQUE: Multiplanar, multisequence MR imaging of the knee was performed. No intravenous contrast was administered.   COMPARISON:  Radiographs 12/29/2021.  MRI 04/18/2020.   FINDINGS: MENISCI   Medial meniscus: New diffuse free edge irregularity of the posterior horn and body which may relate to interval partial meniscectomy. Cannot exclude a radial tear near the meniscal root, best seen on coronal image 10/6. There is new  peripheral extrusion of the meniscus from the joint space.   Lateral meniscus:  Intact with normal morphology.   LIGAMENTS   Cruciates:  Intact.   Collaterals:  Intact.   CARTILAGE   Patellofemoral:  Preserved.   Medial: Progressive chondral thinning and surface irregularity without focal defect or significant subchondral signal abnormality.   Lateral:  Preserved.   MISCELLANEOUS   Joint: Moderate-sized knee joint effusion has enlarged and demonstrates synovial irregularity suspicious for synovitis.   Popliteal Fossa: The popliteus muscle and tendon are intact. No significant Baker's cyst.   Extensor Mechanism:  Intact.   Bones:  No acute or significant extra-articular osseous findings.   Other: New mild T2 hyperintensity within the extensor musculature in the proximal lower leg. No other significant muscular abnormalities identified.   IMPRESSION: 1. New diffuse free edge irregularity of the  posterior horn and body of the medial meniscus which may relate to interval partial meniscectomy. Cannot exclude a new radial tear near the meniscal root, and the meniscus is partially extruded from the joint. 2. Progressive medial compartment degenerative chondrosis. No acute osseous findings. 3. Moderate-sized knee joint effusion with synovial irregularity suspicious for synovitis. 4. New mild T2 hyperintensity within the extensor musculature in the proximal lower leg, potentially related to denervation. 5. The lateral meniscus, cruciate and collateral ligaments are intact.     Electronically Signed   By: Richardean Sale [name]   On: 05/18/2022 09:12    Past Medical/Family/Surgical/Social History: Medications & Allergies reviewed per EMR, new medications updated. Patient Active Problem List   Diagnosis Date Noted   Allergic conjunctivitis of both eyes 05/25/2022   Acute medial meniscus tear of right knee    Plica syndrome of knee, right    Carpal tunnel syndrome, right 06/23/2021   HLD (hyperlipidemia) 04/29/2020   Type 2 diabetes mellitus without complication, without long-term current use of insulin ([place]) 04/24/2020   Tenosynovitis, de Quervain 06/24/2019   Primary osteoarthritis of first carpometacarpal joint of right hand 03/18/2019   Chronic right shoulder pain 03/18/2019   Bilateral foot pain 03/18/2019   Primary osteoarthritis of both knees 03/18/2019   Fibromyalgia 03/15/2019   Lumbar degenerative disc disease 12/19/2018   Myalgia 11/27/2018   Morning stiffness of joints 11/27/2018   DDD (degenerative disc disease), cervical 11/27/2018   Attention deficit hyperactivity disorder (ADHD), predominantly inattentive type 02/27/2018   Arthralgia 02/27/2018   Hypothyroidism 02/27/2018   NASH (nonalcoholic steatohepatitis) 02/27/2018   GAD (generalized anxiety disorder) 01/06/2017   Moderate episode of recurrent major depressive disorder ([place]) 01/06/2017   Environmental  allergies 09/12/2015   Essential hypertension 09/12/2015   OSA on CPAP 09/12/2015   Past Medical History:  Diagnosis Date   ADHD (attention deficit hyperactivity disorder)    Arthritis    Bowel obstruction ([place])    Constipation 07/01/2020   Diabetes mellitus without complication ([place])    Diverticulosis    Fibromyalgia    H/O: hysterectomy 02/27/2018   History of prediabetes 02/27/2018   Hypertension    Post-operative nausea and vomiting    SBO (small bowel obstruction) ([place]) 04/29/2020   Sleep apnea    uses CPAP   Family History  Problem Relation Age of Onset   Diabetes Mother    Skin cancer Mother    Lung cancer Father    Skin cancer Sister    Colon cancer Neg Hx    Colon polyps Neg Hx    Esophageal cancer Neg Hx    Rectal cancer Neg Hx  Stomach cancer Neg Hx    Past Surgical History:  Procedure Laterality Date   ABDOMINAL HYSTERECTOMY     ANTERIOR CERVICAL DECOMP/DISCECTOMY FUSION N/A 02/02/2022   Procedure: ANTERIOR CERVICAL DECOMPRESSION AND FUSION CERVICAL 5- CERVICAL 6, CERVICAL 6- CERVICAL 7 WITH INSTRUMENTATION AND ALLOGRAFT;  Surgeon: Phylliss Bob, MD;  Location: [facility];  Service: Orthopedics;  Laterality: N/A;   COLONOSCOPY  20 years ago    in [place]=diverticulosis    KNEE ARTHROSCOPY WITH EXCISION PLICA Right [id number]   Procedure: RIGHT KNEE ARTHROSCOPY WITH EXCISION PLICA;  Surgeon: Vanetta Mulders, MD;  Location: [facility];  Service: Orthopedics;  Laterality: Right;   LAPAROSCOPY     due to bowel perforation   MYOMECTOMY     OOPHORECTOMY     unilateral   Social History   Occupational History   Not on file  Tobacco Use   Smoking status: Never    Passive exposure: Past   Smokeless tobacco: Never  Vaping Use   Vaping Use: Never used  Substance and Sexual Activity   Alcohol use: Yes    Comment: Occassionally   Drug use: Never   Sexual activity: Not Currently    Birth control/protection: Surgical   I spent 45 minutes in the  care of the patient today including face-to-face time, preparation to see the patient, as well as review of rheumatology labs from 07/11/22, review external notes (PCP and Rheumatology - Deveshwar), counseling and educating the patient on home exercise plan, aquatic therapy other holistic treatment options and workup, possible evaluation with local naturopathic/homeopathic physician for the above diagnoses.   Elba Barman, DO Primary Care Sports Medicine Physician  [place]  This note was dictated using Dragon naturally speaking software and may contain errors in syntax, spelling, or content which have not been identified prior to signing this note.

## 2022-07-22 ENCOUNTER — Encounter: Payer: Self-pay | Admitting: Sports Medicine

## 2022-07-27 ENCOUNTER — Encounter: Payer: 59 | Admitting: Sports Medicine

## 2022-07-28 DIAGNOSIS — M25561 Pain in right knee: Secondary | ICD-10-CM | POA: Diagnosis not present

## 2022-07-28 DIAGNOSIS — M5459 Other low back pain: Secondary | ICD-10-CM | POA: Diagnosis not present

## 2022-07-28 DIAGNOSIS — M25511 Pain in right shoulder: Secondary | ICD-10-CM | POA: Diagnosis not present

## 2022-07-28 DIAGNOSIS — R2689 Other abnormalities of gait and mobility: Secondary | ICD-10-CM | POA: Diagnosis not present

## 2022-07-28 DIAGNOSIS — M542 Cervicalgia: Secondary | ICD-10-CM | POA: Diagnosis not present

## 2022-08-02 ENCOUNTER — Ambulatory Visit: Payer: 59 | Attending: Rheumatology | Admitting: Rheumatology

## 2022-08-02 ENCOUNTER — Encounter: Payer: Self-pay | Admitting: Rheumatology

## 2022-08-02 VITALS — BP 129/79 | HR 80 | Resp 14 | Ht 66.25 in | Wt 219.0 lb

## 2022-08-02 DIAGNOSIS — E782 Mixed hyperlipidemia: Secondary | ICD-10-CM

## 2022-08-02 DIAGNOSIS — M797 Fibromyalgia: Secondary | ICD-10-CM

## 2022-08-02 DIAGNOSIS — R21 Rash and other nonspecific skin eruption: Secondary | ICD-10-CM

## 2022-08-02 DIAGNOSIS — M5030 Other cervical disc degeneration, unspecified cervical region: Secondary | ICD-10-CM | POA: Diagnosis not present

## 2022-08-02 DIAGNOSIS — Z8639 Personal history of other endocrine, nutritional and metabolic disease: Secondary | ICD-10-CM

## 2022-08-02 DIAGNOSIS — K7581 Nonalcoholic steatohepatitis (NASH): Secondary | ICD-10-CM

## 2022-08-02 DIAGNOSIS — R682 Dry mouth, unspecified: Secondary | ICD-10-CM | POA: Diagnosis not present

## 2022-08-02 DIAGNOSIS — M5136 Other intervertebral disc degeneration, lumbar region: Secondary | ICD-10-CM

## 2022-08-02 DIAGNOSIS — M2550 Pain in unspecified joint: Secondary | ICD-10-CM

## 2022-08-02 DIAGNOSIS — M25561 Pain in right knee: Secondary | ICD-10-CM

## 2022-08-02 DIAGNOSIS — M1811 Unilateral primary osteoarthritis of first carpometacarpal joint, right hand: Secondary | ICD-10-CM

## 2022-08-02 DIAGNOSIS — E119 Type 2 diabetes mellitus without complications: Secondary | ICD-10-CM

## 2022-08-02 DIAGNOSIS — I10 Essential (primary) hypertension: Secondary | ICD-10-CM

## 2022-08-02 DIAGNOSIS — G5601 Carpal tunnel syndrome, right upper limb: Secondary | ICD-10-CM | POA: Diagnosis not present

## 2022-08-02 DIAGNOSIS — M51369 Other intervertebral disc degeneration, lumbar region without mention of lumbar back pain or lower extremity pain: Secondary | ICD-10-CM

## 2022-08-02 DIAGNOSIS — M25461 Effusion, right knee: Secondary | ICD-10-CM

## 2022-08-02 DIAGNOSIS — F900 Attention-deficit hyperactivity disorder, predominantly inattentive type: Secondary | ICD-10-CM

## 2022-08-02 DIAGNOSIS — M170 Bilateral primary osteoarthritis of knee: Secondary | ICD-10-CM

## 2022-08-02 DIAGNOSIS — F419 Anxiety disorder, unspecified: Secondary | ICD-10-CM

## 2022-08-02 DIAGNOSIS — M79671 Pain in right foot: Secondary | ICD-10-CM

## 2022-08-02 DIAGNOSIS — G8929 Other chronic pain: Secondary | ICD-10-CM

## 2022-08-02 DIAGNOSIS — M79672 Pain in left foot: Secondary | ICD-10-CM

## 2022-08-02 DIAGNOSIS — H9313 Tinnitus, bilateral: Secondary | ICD-10-CM

## 2022-08-02 DIAGNOSIS — G4733 Obstructive sleep apnea (adult) (pediatric): Secondary | ICD-10-CM

## 2022-08-02 DIAGNOSIS — M6751 Plica syndrome, right knee: Secondary | ICD-10-CM

## 2022-08-02 DIAGNOSIS — F32A Depression, unspecified: Secondary | ICD-10-CM

## 2022-08-02 NOTE — Telephone Encounter (Signed)
Patient was evaluated in the office this afternoon.

## 2022-08-03 ENCOUNTER — Telehealth: Payer: Self-pay | Admitting: Orthopedic Surgery

## 2022-08-03 NOTE — Telephone Encounter (Signed)
Melanie [name] called to cancel her nerve conduction study appt for 08/12/22. She states that she, along with Dr. Rolena Infante decided that she no longer needed this exam.  I cancelled in EPIC.  FYI only

## 2022-08-04 DIAGNOSIS — R2689 Other abnormalities of gait and mobility: Secondary | ICD-10-CM | POA: Diagnosis not present

## 2022-08-04 DIAGNOSIS — M25561 Pain in right knee: Secondary | ICD-10-CM | POA: Diagnosis not present

## 2022-08-04 DIAGNOSIS — M25511 Pain in right shoulder: Secondary | ICD-10-CM | POA: Diagnosis not present

## 2022-08-04 DIAGNOSIS — M5459 Other low back pain: Secondary | ICD-10-CM | POA: Diagnosis not present

## 2022-08-04 DIAGNOSIS — M542 Cervicalgia: Secondary | ICD-10-CM | POA: Diagnosis not present

## 2022-08-10 DIAGNOSIS — M542 Cervicalgia: Secondary | ICD-10-CM | POA: Diagnosis not present

## 2022-08-10 DIAGNOSIS — M25511 Pain in right shoulder: Secondary | ICD-10-CM | POA: Diagnosis not present

## 2022-08-10 DIAGNOSIS — M5459 Other low back pain: Secondary | ICD-10-CM | POA: Diagnosis not present

## 2022-08-10 DIAGNOSIS — M25561 Pain in right knee: Secondary | ICD-10-CM | POA: Diagnosis not present

## 2022-08-10 DIAGNOSIS — R2689 Other abnormalities of gait and mobility: Secondary | ICD-10-CM | POA: Diagnosis not present

## 2022-08-12 ENCOUNTER — Encounter: Payer: Self-pay | Admitting: Podiatrist

## 2022-08-12 ENCOUNTER — Encounter: Payer: Self-pay | Admitting: Physical Medicine and Rehabilitation

## 2022-08-12 ENCOUNTER — Encounter: Payer: Self-pay | Admitting: Rheumatology

## 2022-08-18 DIAGNOSIS — M25511 Pain in right shoulder: Secondary | ICD-10-CM | POA: Diagnosis not present

## 2022-08-18 DIAGNOSIS — M542 Cervicalgia: Secondary | ICD-10-CM | POA: Diagnosis not present

## 2022-08-18 DIAGNOSIS — M25561 Pain in right knee: Secondary | ICD-10-CM | POA: Diagnosis not present

## 2022-08-18 DIAGNOSIS — R2689 Other abnormalities of gait and mobility: Secondary | ICD-10-CM | POA: Diagnosis not present

## 2022-08-18 DIAGNOSIS — M5459 Other low back pain: Secondary | ICD-10-CM | POA: Diagnosis not present

## 2022-08-25 ENCOUNTER — Ambulatory Visit: Payer: Self-pay | Admitting: Medical-Surgical

## 2022-08-25 ENCOUNTER — Ambulatory Visit: Payer: 59 | Admitting: Sports Medicine

## 2022-09-01 ENCOUNTER — Encounter: Payer: Self-pay | Admitting: Sports Medicine

## 2022-09-01 ENCOUNTER — Ambulatory Visit: Payer: 59 | Admitting: Sports Medicine

## 2022-09-01 DIAGNOSIS — G8929 Other chronic pain: Secondary | ICD-10-CM

## 2022-09-01 DIAGNOSIS — M25561 Pain in right knee: Secondary | ICD-10-CM | POA: Diagnosis not present

## 2022-09-01 DIAGNOSIS — M25511 Pain in right shoulder: Secondary | ICD-10-CM

## 2022-09-01 DIAGNOSIS — M7531 Calcific tendinitis of right shoulder: Secondary | ICD-10-CM | POA: Diagnosis not present

## 2022-09-01 DIAGNOSIS — M5459 Other low back pain: Secondary | ICD-10-CM | POA: Diagnosis not present

## 2022-09-01 DIAGNOSIS — M797 Fibromyalgia: Secondary | ICD-10-CM | POA: Diagnosis not present

## 2022-09-01 DIAGNOSIS — M25531 Pain in right wrist: Secondary | ICD-10-CM

## 2022-09-01 DIAGNOSIS — R2689 Other abnormalities of gait and mobility: Secondary | ICD-10-CM | POA: Diagnosis not present

## 2022-09-01 DIAGNOSIS — M542 Cervicalgia: Secondary | ICD-10-CM | POA: Diagnosis not present

## 2022-09-01 MED ORDER — BUPIVACAINE HCL 0.25 % IJ SOLN
2.0000 mL | INTRAMUSCULAR | Status: AC | PRN
  Administered 2022-09-01: 2 mL via INTRA_ARTICULAR

## 2022-09-01 MED ORDER — METHYLPREDNISOLONE ACETATE 40 MG/ML IJ SUSP
40.0000 mg | INTRAMUSCULAR | Status: AC | PRN
  Administered 2022-09-01: 40 mg via INTRA_ARTICULAR

## 2022-09-01 MED ORDER — LIDOCAINE HCL 1 % IJ SOLN
2.0000 mL | INTRAMUSCULAR | Status: AC | PRN
  Administered 2022-09-01: 2 mL

## 2022-09-01 NOTE — Progress Notes (Signed)
Here today for her routine orthopedic "issues" follow up  We limited her to the 2 main problematic areas today: Knee/Shoulder  States the knee is not doing any better, swelling appeared 08/31/22  States the shoulder is also increasing In pain  Denies any medication usage for pain  Is doing PT 1x/week

## 2022-09-01 NOTE — Progress Notes (Signed)
Melanie [name] - 57 y.o. female MRN [id number]  Date of birth: [birth year]  Office Visit Note: Visit Date: 09/01/2022 PCP: Samuel Bouche, NP Referred by: Samuel Bouche, NP  Subjective: Chief Complaint  Patient presents with   Right Knee - Pain   HPI: Melanie [name] is a pleasant 57 y.o. female who presents today for right shoulder pain, acute on chronic right knee pain, multiple joint pain.  Right shoulder -history of calcific tendinitis of the right rotator cuff.  Her shoulder has been bothering her more so over the last month or so.  Feels some mild swelling without redness or significant warmth.  Pain with reaching motions.  She has been doing physical therapy and doing dry needling which helps to some extent.  Right knee -she had a history of plica excision and interval partial meniscectomy by my partner, Dr. Sammuel Hines in the past.  She initially had relief, but months following this her knee pain has returned.  She has had recurrent effusions in the knee which we have aspirated and injected twice.  She had a repeat MRI done which showed evidence of interval partial meniscectomy with irregularity of the medial meniscus with possible extrusion.  She has been sick recently and does note that when she is off of her feet and resting her knee pain is much improved and the swelling is less.  The knee will swell more and cause her pain when she has to do a lot of walking and is unable to elevate the knees.  Rheumatology - follows with Dr. Estanislado Pandy. Has had + ANA but unremarkable further lab testing. Last visit, did recommend referral to [facility].   Fibromyalgia/CFS - continues with overall fatigue, multiple joint pain and body pain.  She is continuing with physical therapy.  She is managed on low-dose naltrexone 1.5 mg daily.  Continues with chronic right wrist pain.  Has a history of carpal tunnel syndromes.  She continues in that sleeping in a cock-up wrist brace at night which does  help her symptoms, but is still present.  Intermittently will have some swelling here.  She also has pain in the feet.  When she is on her feet for prolonged periods of time her feet will really bother her.  Needs to sit and elevate the legs to improve the feelings of heaviness, her right leg is worse than left.  Pertinent ROS were reviewed with the patient and found to be negative unless otherwise specified above in HPI.   Assessment & Plan: Visit Diagnoses:  1. Calcific tendinitis of right shoulder   2. Chronic right shoulder pain   3. Fibromyalgia   4. Chronic pain of right knee   5. Pain in right wrist    Plan: For her right shoulder, I do think her calcific tendinitis may be exacerbating her shoulder pain, she does seem to have irritation of the subacromial bursa.  Through shared decision-making, elected to proceed with subacromial joint injection.  Patient tolerated well.  I did discuss that one of my partners, Dr. Marlou Sa, does specialize in the shoulder and if she has interest in seeing him for this specifically, I would recommend his recommendation for further management.  She has undergone ultrasound-guided injections with needle guidance near the calcification. Did do 2 shockwave treatments here with only some relief.  Her right knee pain is chronic and she has had recurrent effusion ever since her plica excision and meniscectomy.  Do think she should continue compression therapy and physical  therapy.  May be beneficial to get a secondary surgical opinion, recommended seeking Dr. Eddie Dibbles opinion on further treatment.  For her fibromyalgia and multiple joint pain, we will continue her on low-dose naltrexone 1.5 mg daily.  She will continue her formalized physical therapy, recommended adequate sleep, sunshine, exercise.  She does have pain in the wrist and feet as well, and her multiple ailments do interfere with her quality of life and ability to do daily functions. Working with [employer] on  disability status. Have recommended functional medicine physicians in the past as well, she is looking at possibly being able to get into see Dr. Junius Roads or seeing [facility] again.  Follow-up: with me as needed; recommend f/u with [facility] on her knee if she desires   Meds & Orders:  - continue Naltrexone 1.'5mg'$  qHS   Orders Placed This Encounter  Procedures   Large Joint Inj: R subacromial bursa     Procedures: Large Joint Inj: R subacromial bursa on 09/01/2022 3:27 PM Indications: pain Details: 22 G 1.5 in needle, posterior approach Medications: 2 mL lidocaine 1 %; 2 mL bupivacaine 0.25 %; 40 mg methylPREDNISolone acetate 40 MG/ML Outcome: tolerated well, no immediate complications  Subacromial Joint Injection, Right Shoulder After discussion on risks/benefits/indications, informed verbal consent was obtained. A timeout was then performed. Patient was seated on table in exam room. The patient's shoulder was prepped with betadine and alcohol swabs and utilizing posterior approach a 22G, 1.5" needle was directed anteriorly and laterally into the patient's subacromial space was injected with 2:2:1 mixture of lidocaine:bupivicaine:depomedrol with appreciation of free-flowing of the injectate into the bursal space. Patient tolerated the procedure well without immediate complications.   Procedure, treatment alternatives, risks and benefits explained, specific risks discussed. Consent was given by the patient. Immediately prior to procedure a time out was called to verify the correct patient, procedure, equipment, support staff and site/side marked as required. Patient was prepped and draped in the usual sterile fashion.          Clinical History: No specialty comments available.  She reports that she has never smoked. She has been exposed to tobacco smoke. She has never used smokeless tobacco.  Recent Labs    01/28/22 0918  HGBA1C 5.7*    Objective:    Physical Exam   Gen: Well-appearing, in no acute distress; non-toxic CV: Well-perfused. Warm.  Resp: Breathing unlabored on room air; no wheezing. Psych: Fluid speech in conversation; appropriate affect; normal thought process Neuro: Sensation intact throughout. No gross coordination deficits.   Ortho Exam  - Right shoulder: There is full active and passive range of motion of the shoulder although pain with endrange abduction and internal/external rotation.  Inspection does reveal some mild warmth and swelling in the subacromial space, no overlying redness.  No mechanical blocks to internal or external rotation.  No gross rotator cuff strength deficits.  - Right knee: There is a moderate effusion noted of the knee joint.  Range of motion 0-125 degrees. NVI.   Imaging: - R-shoulder x-ray 12/22/21:  Narrative & Impression  CLINICAL DATA:  Chronic right shoulder pain.   EXAM: RIGHT SHOULDER - 2+ VIEW   COMPARISON:  None Available.   FINDINGS: There is no evidence of fracture or dislocation. There is no evidence of arthropathy or other focal bone abnormality. A 6.2 mm area of focal soft tissue calcification is seen adjacent to the greater tubercle of the right humeral head.   IMPRESSION: 1. No acute osseous abnormality. 2.  Findings suggestive of mild calcific tendinopathy along the greater tubercle of the right humeral head.       Past Medical/Family/Surgical/Social History: Medications & Allergies reviewed per EMR, new medications updated. Patient Active Problem List   Diagnosis Date Noted   Allergic conjunctivitis of both eyes 05/25/2022   Acute medial meniscus tear of right knee    Plica syndrome of knee, right    Carpal tunnel syndrome, right 06/23/2021   HLD (hyperlipidemia) 04/29/2020   Type 2 diabetes mellitus without complication, without long-term current use of insulin ([place]) 04/24/2020   Tenosynovitis, de Quervain 06/24/2019   Primary osteoarthritis of first carpometacarpal joint  of right hand 03/18/2019   Chronic right shoulder pain 03/18/2019   Bilateral foot pain 03/18/2019   Primary osteoarthritis of both knees 03/18/2019   Fibromyalgia 03/15/2019   Lumbar degenerative disc disease 12/19/2018   Myalgia 11/27/2018   Morning stiffness of joints 11/27/2018   DDD (degenerative disc disease), cervical 11/27/2018   Attention deficit hyperactivity disorder (ADHD), predominantly inattentive type 02/27/2018   Arthralgia 02/27/2018   Hypothyroidism 02/27/2018   NASH (nonalcoholic steatohepatitis) 02/27/2018   GAD (generalized anxiety disorder) 01/06/2017   Moderate episode of recurrent major depressive disorder ([place]) 01/06/2017   Environmental allergies 09/12/2015   Essential hypertension 09/12/2015   OSA on CPAP 09/12/2015   Past Medical History:  Diagnosis Date   ADHD (attention deficit hyperactivity disorder)    Arthritis    Bowel obstruction ([place])    Constipation 07/01/2020   Diabetes mellitus without complication ([place])    Diverticulosis    Fibromyalgia    H/O: hysterectomy 02/27/2018   History of prediabetes 02/27/2018   Hypertension    Post-operative nausea and vomiting    SBO (small bowel obstruction) ([place]) 04/29/2020   Sleep apnea    uses CPAP   Family History  Problem Relation Age of Onset   Diabetes Mother    Skin cancer Mother    Lung cancer Father    Skin cancer Sister    Colon cancer Neg Hx    Colon polyps Neg Hx    Esophageal cancer Neg Hx    Rectal cancer Neg Hx    Stomach cancer Neg Hx    Past Surgical History:  Procedure Laterality Date   ABDOMINAL HYSTERECTOMY     ANTERIOR CERVICAL DECOMP/DISCECTOMY FUSION N/A 02/02/2022   Procedure: ANTERIOR CERVICAL DECOMPRESSION AND FUSION CERVICAL 5- CERVICAL 6, CERVICAL 6- CERVICAL 7 WITH INSTRUMENTATION AND ALLOGRAFT;  Surgeon: Phylliss Bob, MD;  Location: [facility];  Service: Orthopedics;  Laterality: N/A;   COLONOSCOPY  20 years ago    in [place]=diverticulosis    KNEE ARTHROSCOPY WITH  EXCISION PLICA Right [id number]   Procedure: RIGHT KNEE ARTHROSCOPY WITH EXCISION PLICA;  Surgeon: Vanetta Mulders, MD;  Location: [facility];  Service: Orthopedics;  Laterality: Right;   LAPAROSCOPY     due to bowel perforation   MYOMECTOMY     OOPHORECTOMY     unilateral   Social History   Occupational History   Not on file  Tobacco Use   Smoking status: Never    Passive exposure: Past   Smokeless tobacco: Never  Vaping Use   Vaping Use: Never used  Substance and Sexual Activity   Alcohol use: Yes    Comment: Occassionally   Drug use: Never   Sexual activity: Not Currently    Birth control/protection: Surgical

## 2022-09-02 ENCOUNTER — Other Ambulatory Visit (HOSPITAL_COMMUNITY): Payer: Self-pay

## 2022-09-02 ENCOUNTER — Ambulatory Visit (INDEPENDENT_AMBULATORY_CARE_PROVIDER_SITE_OTHER): Payer: 59 | Admitting: Medical-Surgical

## 2022-09-02 ENCOUNTER — Encounter: Payer: Self-pay | Admitting: Medical-Surgical

## 2022-09-02 VITALS — BP 122/80 | HR 72 | Resp 20 | Ht 66.25 in | Wt 216.0 lb

## 2022-09-02 DIAGNOSIS — F411 Generalized anxiety disorder: Secondary | ICD-10-CM

## 2022-09-02 DIAGNOSIS — E119 Type 2 diabetes mellitus without complications: Secondary | ICD-10-CM | POA: Diagnosis not present

## 2022-09-02 DIAGNOSIS — F900 Attention-deficit hyperactivity disorder, predominantly inattentive type: Secondary | ICD-10-CM | POA: Diagnosis not present

## 2022-09-02 DIAGNOSIS — I10 Essential (primary) hypertension: Secondary | ICD-10-CM

## 2022-09-02 DIAGNOSIS — F331 Major depressive disorder, recurrent, moderate: Secondary | ICD-10-CM

## 2022-09-02 LAB — POCT GLYCOSYLATED HEMOGLOBIN (HGB A1C): Hemoglobin A1C: 6.1 % — AB (ref 4.0–5.6)

## 2022-09-02 MED ORDER — BUPROPION HCL ER (XL) 150 MG PO TB24
150.0000 mg | ORAL_TABLET | ORAL | 0 refills | Status: DC
  Filled 2022-09-02: qty 30, 30d supply, fill #0

## 2022-09-02 MED ORDER — LOSARTAN POTASSIUM 50 MG PO TABS
50.0000 mg | ORAL_TABLET | Freq: Every evening | ORAL | 1 refills | Status: DC
  Filled 2022-09-02 – 2022-09-14 (×2): qty 30, 30d supply, fill #0

## 2022-09-02 NOTE — Progress Notes (Signed)
Established Patient Office Visit  Subjective   Patient ID: Melanie [name], female   DOB: 07-12-1965 Age: 57 y.o. MRN: [id number]   Chief Complaint  Patient presents with   Follow-up   Hypertension   Diabetes   HPI Pleasant 57 year old female presenting today for the following:  ADHD: Has been taking Vyvanse 50 mg daily, tolerating well without side effects.  She has been using this more as a as needed medication since she is no longer working.  She recently had a change in her insurance and has found out that insurance is not going to cover Vyvanse or its generic.  She plans to look up what is covered in her network and let me know.  Hypertension: Taking losartan 50 mg daily, tolerating well without side effects.  Not checking her blood pressures at home.  Working to follow a low-sodium diet.  Not regularly exercising due to orthopedic conditions.  Denies CP, SOB, palpitations, lower extremity edema, dizziness, headaches, or vision changes.  Mood: Has been struggling due to chronic pain.  Not currently taking any medications for mood management.  Took Wellbutrin in the past and has thought about going back on this recently but is not sure.  Denies SI/HI.  Diabetes: Was previously prescribed Mounjaro and had been taking berberine.  She has been off of both of these medications and wonders if she was ever truly diabetic.  She is not checking sugars at home.  Up-to-date on her eye exam and foot exam done today.   Objective:    Vitals:   09/02/22 1035  BP: 122/80  Pulse: 72  Resp: 20  Height: 5' 6.25" (1.683 m)  Weight: 216 lb (98 kg)  SpO2: 98%  BMI (Calculated): 34.59    Physical Exam Vitals reviewed.  Constitutional:      General: She is not in acute distress.    Appearance: Normal appearance. She is obese. She is not ill-appearing.  HENT:     Head: Normocephalic and atraumatic.  Cardiovascular:     Rate and Rhythm: Normal rate and regular rhythm.     Pulses:  Normal pulses.     Heart sounds: Normal heart sounds.  Pulmonary:     Effort: Pulmonary effort is normal. No respiratory distress.     Breath sounds: Normal breath sounds. No wheezing, rhonchi or rales.  Skin:    General: Skin is warm and dry.  Neurological:     Mental Status: She is alert and oriented to person, place, and time.  Psychiatric:        Mood and Affect: Mood normal.        Behavior: Behavior normal.        Thought Content: Thought content normal.        Judgment: Judgment normal.   No results found for this or any previous visit (from the past 24 hour(s)).     The 10-year ASCVD risk score (Arnett DK, et al., 2019) is: 5.1%   Values used to calculate the score:     Age: 44 years     Sex: Female     Is Non-Hispanic African American: No     Diabetic: Yes     Tobacco smoker: No     Systolic Blood Pressure: 123XX123 mmHg     Is BP treated: Yes     HDL Cholesterol: 63 mg/dL     Total Cholesterol: 246 mg/dL   Assessment & Plan:   1. Essential hypertension Blood pressure at goal.  Continue losartan 50 mg daily.  Continue low-sodium diet. - losartan (COZAAR) 50 MG tablet; Take 1 tablet (50 mg total) by mouth every evening.  Dispense: 90 tablet; Refill: 1  2. Type 2 diabetes mellitus without complication, without long-term current use of insulin (HCC) POCT hemoglobin A1c 6.1% today.  Currently diet and lifestyle controlled.  Consider restarting berberine to help with cholesterol and glucose management. - POCT HgB A1C  3. Attention deficit hyperactivity disorder (ADHD), predominantly inattentive type Patient will contact me regarding what her insurance covers and we will plan to switch to a different medication.  4. GAD (generalized anxiety disorder) 5. Moderate episode of recurrent major depressive disorder (HCC) Restarting Wellbutrin 150 mg daily.  Referring to behavioral health for counseling. - Ambulatory referral to [facility]  Return in about 6 months  (around 03/05/2023) for chronic disease follow up.  ___________________________________________ Clearnce Sorrel, DNP, APRN, FNP-BC Primary Care and [place]

## 2022-09-12 ENCOUNTER — Other Ambulatory Visit (HOSPITAL_COMMUNITY): Payer: Self-pay

## 2022-09-14 ENCOUNTER — Ambulatory Visit: Payer: 59 | Admitting: Orthopedic Surgery

## 2022-09-14 ENCOUNTER — Other Ambulatory Visit (HOSPITAL_COMMUNITY): Payer: Self-pay

## 2022-09-14 DIAGNOSIS — M542 Cervicalgia: Secondary | ICD-10-CM | POA: Diagnosis not present

## 2022-09-14 DIAGNOSIS — M25561 Pain in right knee: Secondary | ICD-10-CM | POA: Diagnosis not present

## 2022-09-14 DIAGNOSIS — R2689 Other abnormalities of gait and mobility: Secondary | ICD-10-CM | POA: Diagnosis not present

## 2022-09-14 DIAGNOSIS — M25511 Pain in right shoulder: Secondary | ICD-10-CM | POA: Diagnosis not present

## 2022-09-14 DIAGNOSIS — M5459 Other low back pain: Secondary | ICD-10-CM | POA: Diagnosis not present

## 2022-09-20 DIAGNOSIS — M5459 Other low back pain: Secondary | ICD-10-CM | POA: Diagnosis not present

## 2022-09-20 DIAGNOSIS — R2689 Other abnormalities of gait and mobility: Secondary | ICD-10-CM | POA: Diagnosis not present

## 2022-09-20 DIAGNOSIS — M25511 Pain in right shoulder: Secondary | ICD-10-CM | POA: Diagnosis not present

## 2022-09-20 DIAGNOSIS — M542 Cervicalgia: Secondary | ICD-10-CM | POA: Diagnosis not present

## 2022-09-20 DIAGNOSIS — M25561 Pain in right knee: Secondary | ICD-10-CM | POA: Diagnosis not present

## 2022-09-26 DIAGNOSIS — G4733 Obstructive sleep apnea (adult) (pediatric): Secondary | ICD-10-CM | POA: Diagnosis not present

## 2022-09-28 ENCOUNTER — Telehealth: Payer: Self-pay | Admitting: Sports Medicine

## 2022-09-28 DIAGNOSIS — M542 Cervicalgia: Secondary | ICD-10-CM | POA: Diagnosis not present

## 2022-09-28 DIAGNOSIS — M5459 Other low back pain: Secondary | ICD-10-CM | POA: Diagnosis not present

## 2022-09-28 DIAGNOSIS — R2689 Other abnormalities of gait and mobility: Secondary | ICD-10-CM | POA: Diagnosis not present

## 2022-09-28 DIAGNOSIS — M25561 Pain in right knee: Secondary | ICD-10-CM | POA: Diagnosis not present

## 2022-09-28 DIAGNOSIS — M25511 Pain in right shoulder: Secondary | ICD-10-CM | POA: Diagnosis not present

## 2022-09-28 NOTE — Telephone Encounter (Signed)
Received call from patient. She checking if we received fax from [facility]. I advised we have not. I gave her both our fax numbers. She will reach out to [facility] and have them re-fax. I emailed patient authorization form to [email or web address] per her request.

## 2022-10-03 ENCOUNTER — Telehealth: Payer: Self-pay | Admitting: Sports Medicine

## 2022-10-03 NOTE — Telephone Encounter (Signed)
[place] forms received. To [employer]. 

## 2022-10-05 ENCOUNTER — Ambulatory Visit: Payer: 59 | Admitting: Orthopedic Surgery

## 2022-10-07 ENCOUNTER — Encounter: Payer: Self-pay | Admitting: Medical-Surgical

## 2022-10-07 DIAGNOSIS — Z9109 Other allergy status, other than to drugs and biological substances: Secondary | ICD-10-CM

## 2022-10-07 DIAGNOSIS — I10 Essential (primary) hypertension: Secondary | ICD-10-CM

## 2022-10-10 MED ORDER — LEVOCETIRIZINE DIHYDROCHLORIDE 5 MG PO TABS
5.0000 mg | ORAL_TABLET | Freq: Every evening | ORAL | 3 refills | Status: DC
  Filled 2022-10-10: qty 30, 30d supply, fill #0

## 2022-10-10 MED ORDER — LOSARTAN POTASSIUM 50 MG PO TABS
50.0000 mg | ORAL_TABLET | Freq: Every evening | ORAL | 3 refills | Status: DC
  Filled 2022-10-10: qty 30, 30d supply, fill #0

## 2022-10-11 ENCOUNTER — Other Ambulatory Visit (HOSPITAL_COMMUNITY): Payer: Self-pay

## 2022-10-14 ENCOUNTER — Other Ambulatory Visit (HOSPITAL_COMMUNITY): Payer: Self-pay

## 2022-10-14 MED ORDER — LOSARTAN POTASSIUM 50 MG PO TABS
50.0000 mg | ORAL_TABLET | Freq: Every evening | ORAL | 3 refills | Status: DC
Start: 2022-10-14 — End: 2023-03-27

## 2022-10-14 MED ORDER — LEVOCETIRIZINE DIHYDROCHLORIDE 5 MG PO TABS
5.0000 mg | ORAL_TABLET | Freq: Every evening | ORAL | 3 refills | Status: DC
Start: 2022-10-14 — End: 2023-03-27

## 2022-10-14 MED ORDER — LOSARTAN POTASSIUM 50 MG PO TABS
50.0000 mg | ORAL_TABLET | Freq: Every evening | ORAL | 3 refills | Status: DC
Start: 2022-10-14 — End: 2022-10-14

## 2022-10-14 MED ORDER — LEVOCETIRIZINE DIHYDROCHLORIDE 5 MG PO TABS
5.0000 mg | ORAL_TABLET | Freq: Every evening | ORAL | 3 refills | Status: DC
Start: 2022-10-14 — End: 2022-10-14

## 2022-10-14 NOTE — Addendum Note (Signed)
Addended by: Chalmers Cater on: 10/14/2022 01:23 PM   Modules accepted: Orders

## 2022-10-14 NOTE — Addendum Note (Signed)
Addended by: Chalmers Cater on: 10/14/2022 01:16 PM   Modules accepted: Orders

## 2022-10-17 ENCOUNTER — Encounter: Payer: Self-pay | Admitting: *Deleted

## 2022-10-19 ENCOUNTER — Ambulatory Visit: Payer: 59 | Admitting: Orthopedic Surgery

## 2022-10-20 ENCOUNTER — Other Ambulatory Visit: Payer: Self-pay | Admitting: Sports Medicine

## 2022-10-20 ENCOUNTER — Encounter: Payer: Self-pay | Admitting: Sports Medicine

## 2022-10-20 MED ORDER — NALTREXONE HCL (PAIN) 1.5 MG PO CAPS: 1.0000 | ORAL_CAPSULE | Freq: Two times a day (BID) | ORAL | 0 refills | Status: AC

## 2022-10-26 ENCOUNTER — Telehealth: Payer: Self-pay | Admitting: Sports Medicine

## 2022-10-26 NOTE — Telephone Encounter (Signed)
Received [facility] forms. Patient states Dr. Shon Baton has taken her oow since treatment 04/26/22. Please advise and provide note if applicable. Thanks!

## 2022-10-27 DIAGNOSIS — G4733 Obstructive sleep apnea (adult) (pediatric): Secondary | ICD-10-CM | POA: Diagnosis not present

## 2022-11-02 ENCOUNTER — Encounter: Payer: Self-pay | Admitting: Sports Medicine

## 2022-11-03 ENCOUNTER — Other Ambulatory Visit: Payer: Self-pay | Admitting: Sports Medicine

## 2022-11-03 DIAGNOSIS — G8929 Other chronic pain: Secondary | ICD-10-CM

## 2022-11-03 DIAGNOSIS — M2550 Pain in unspecified joint: Secondary | ICD-10-CM

## 2022-11-03 DIAGNOSIS — M797 Fibromyalgia: Secondary | ICD-10-CM

## 2022-11-03 DIAGNOSIS — S83241S Other tear of medial meniscus, current injury, right knee, sequela: Secondary | ICD-10-CM

## 2022-11-03 NOTE — Progress Notes (Signed)
Telephone call with patient on 11/03/22:  Discussed with her her ongoing right knee issues which is still been unresolved.  Back on 01/18/22 she underwent partial medial meniscectomy and plica excision by Dr. Steward Drone.  She had partial relief of her pain, but the knee pain never went away and she has had intermittent recurrent effusions which we had to treat since that time.  We proceeded with a repeat MRI of the right knee on 05/16/2022 which showed irregularity of the posterior horn and body of the medial meniscus which was concerning for possible new radial tear of the meniscal root as well as the moderate-sized joint effusion (see report below).  We have been treating this conservatively with aspiration and injection, home therapy, rehab.   Also underwent anterior cervical decompression and fusion from C5-C7 with Dr. Estill Bamberg on 02/02/2022.  She was on short-term disability given these surgeries and then has continued to be unable to work given the recurrent knee effusion and pain and her likely new meniscal tear.  She previously worked as a Occupational psychologist and given these physical duties being on her feet for long hours, repetitive bending, up/down steps her pain becomes unbearable and will increase her swelling within the knee. She has been out of work since this time. She also has been diagnosed with fibromyalgia/chronic fatigue syndrome with polyarthralgia.  She has done extensive physical therapy with only some improvement.  She is also managed on low-dose naltrexone 1.5 mg daily.  This certainly interferes with her activities of daily living, although I did discuss with Levenia I think it would be wise for a second opinion on her meniscal tear as I am hopeful that we will further be able to improve this with other treatments.  Will send a referral to Dr. Hurshel Keys to review her previous MRI and discuss next steps in terms of the knee.  I also think she would benefit from being evaluated by chronic  pain management given her fibromyalgia and her other ailments to work on pharmacologic optimization, rehab and conservative treatment for her pain and daily physical function.  Referral sent to [facility] today.  In my medical opinion, I do think the sequela of her meniscal tear and knee effusions have kept her from being unable to work due to the pain and recurrent swelling. Hopefully she can have further relief from her second opinion with orthopedic surgery regarding her knee as her meniscal tear.  I did discuss with Brithany that if future long-term disability or work modifications need to be addressed, this would be better  addressed with a chronic pain management or PMR physician.  Referral was sent today.  I did discuss all these details with her today.  Madelyn Brunner, DO Primary Care Sports Medicine Physician  [facility] - Orthopedics  This note was dictated using Dragon naturally speaking software and may contain errors in syntax, spelling, or content which have not been identified prior to signing this note.

## 2022-11-22 DIAGNOSIS — F329 Major depressive disorder, single episode, unspecified: Secondary | ICD-10-CM | POA: Diagnosis not present

## 2022-11-23 DIAGNOSIS — M1711 Unilateral primary osteoarthritis, right knee: Secondary | ICD-10-CM | POA: Diagnosis not present

## 2022-11-26 DIAGNOSIS — G4733 Obstructive sleep apnea (adult) (pediatric): Secondary | ICD-10-CM | POA: Diagnosis not present

## 2022-11-29 DIAGNOSIS — F329 Major depressive disorder, single episode, unspecified: Secondary | ICD-10-CM | POA: Diagnosis not present

## 2022-12-06 DIAGNOSIS — F32A Depression, unspecified: Secondary | ICD-10-CM | POA: Diagnosis not present

## 2022-12-13 DIAGNOSIS — M25561 Pain in right knee: Secondary | ICD-10-CM | POA: Diagnosis not present

## 2022-12-13 DIAGNOSIS — R260 Ataxic gait: Secondary | ICD-10-CM | POA: Diagnosis not present

## 2022-12-13 DIAGNOSIS — Z5189 Encounter for other specified aftercare: Secondary | ICD-10-CM | POA: Diagnosis not present

## 2022-12-13 DIAGNOSIS — M1711 Unilateral primary osteoarthritis, right knee: Secondary | ICD-10-CM | POA: Diagnosis not present

## 2022-12-15 DIAGNOSIS — F329 Major depressive disorder, single episode, unspecified: Secondary | ICD-10-CM | POA: Diagnosis not present

## 2022-12-15 DIAGNOSIS — M25561 Pain in right knee: Secondary | ICD-10-CM | POA: Diagnosis not present

## 2022-12-15 DIAGNOSIS — Z5189 Encounter for other specified aftercare: Secondary | ICD-10-CM | POA: Diagnosis not present

## 2022-12-15 DIAGNOSIS — R260 Ataxic gait: Secondary | ICD-10-CM | POA: Diagnosis not present

## 2022-12-20 NOTE — Progress Notes (Signed)
Erroneus encounter /please disregard

## 2022-12-22 DIAGNOSIS — M25561 Pain in right knee: Secondary | ICD-10-CM | POA: Diagnosis not present

## 2022-12-22 DIAGNOSIS — Z5189 Encounter for other specified aftercare: Secondary | ICD-10-CM | POA: Diagnosis not present

## 2022-12-22 DIAGNOSIS — F329 Major depressive disorder, single episode, unspecified: Secondary | ICD-10-CM | POA: Diagnosis not present

## 2022-12-22 DIAGNOSIS — M1711 Unilateral primary osteoarthritis, right knee: Secondary | ICD-10-CM | POA: Diagnosis not present

## 2022-12-22 DIAGNOSIS — R260 Ataxic gait: Secondary | ICD-10-CM | POA: Diagnosis not present

## 2022-12-27 DIAGNOSIS — G4733 Obstructive sleep apnea (adult) (pediatric): Secondary | ICD-10-CM | POA: Diagnosis not present

## 2022-12-29 DIAGNOSIS — F329 Major depressive disorder, single episode, unspecified: Secondary | ICD-10-CM | POA: Diagnosis not present

## 2022-12-30 DIAGNOSIS — F332 Major depressive disorder, recurrent severe without psychotic features: Secondary | ICD-10-CM | POA: Diagnosis not present

## 2023-01-11 DIAGNOSIS — M1711 Unilateral primary osteoarthritis, right knee: Secondary | ICD-10-CM | POA: Diagnosis not present

## 2023-01-13 DIAGNOSIS — F329 Major depressive disorder, single episode, unspecified: Secondary | ICD-10-CM | POA: Diagnosis not present

## 2023-01-20 DIAGNOSIS — F901 Attention-deficit hyperactivity disorder, predominantly hyperactive type: Secondary | ICD-10-CM | POA: Diagnosis not present

## 2023-01-23 DIAGNOSIS — F329 Major depressive disorder, single episode, unspecified: Secondary | ICD-10-CM | POA: Diagnosis not present

## 2023-01-26 DIAGNOSIS — G4733 Obstructive sleep apnea (adult) (pediatric): Secondary | ICD-10-CM | POA: Diagnosis not present

## 2023-02-02 DIAGNOSIS — F329 Major depressive disorder, single episode, unspecified: Secondary | ICD-10-CM | POA: Diagnosis not present

## 2023-02-09 DIAGNOSIS — G4733 Obstructive sleep apnea (adult) (pediatric): Secondary | ICD-10-CM | POA: Diagnosis not present

## 2023-02-10 DIAGNOSIS — F329 Major depressive disorder, single episode, unspecified: Secondary | ICD-10-CM | POA: Diagnosis not present

## 2023-02-16 DIAGNOSIS — F329 Major depressive disorder, single episode, unspecified: Secondary | ICD-10-CM | POA: Diagnosis not present

## 2023-02-17 ENCOUNTER — Other Ambulatory Visit: Payer: Self-pay | Admitting: Medical-Surgical

## 2023-02-17 DIAGNOSIS — Z1231 Encounter for screening mammogram for malignant neoplasm of breast: Secondary | ICD-10-CM

## 2023-02-20 DIAGNOSIS — M1711 Unilateral primary osteoarthritis, right knee: Secondary | ICD-10-CM | POA: Diagnosis not present

## 2023-02-26 DIAGNOSIS — G4733 Obstructive sleep apnea (adult) (pediatric): Secondary | ICD-10-CM | POA: Diagnosis not present

## 2023-02-27 DIAGNOSIS — F329 Major depressive disorder, single episode, unspecified: Secondary | ICD-10-CM | POA: Diagnosis not present

## 2023-03-07 ENCOUNTER — Ambulatory Visit: Payer: 59 | Admitting: Medical-Surgical

## 2023-03-09 ENCOUNTER — Ambulatory Visit (INDEPENDENT_AMBULATORY_CARE_PROVIDER_SITE_OTHER): Payer: 59 | Admitting: Medical-Surgical

## 2023-03-09 DIAGNOSIS — Z91199 Patient's noncompliance with other medical treatment and regimen due to unspecified reason: Secondary | ICD-10-CM

## 2023-03-12 DIAGNOSIS — G4733 Obstructive sleep apnea (adult) (pediatric): Secondary | ICD-10-CM | POA: Diagnosis not present

## 2023-03-17 ENCOUNTER — Ambulatory Visit: Payer: 59 | Admitting: Medical-Surgical

## 2023-03-27 ENCOUNTER — Ambulatory Visit: Payer: 59 | Admitting: Medical-Surgical

## 2023-03-27 ENCOUNTER — Encounter: Payer: Self-pay | Admitting: Medical-Surgical

## 2023-03-27 VITALS — BP 131/81 | HR 73 | Resp 20 | Ht 66.25 in | Wt 221.8 lb

## 2023-03-27 DIAGNOSIS — E039 Hypothyroidism, unspecified: Secondary | ICD-10-CM

## 2023-03-27 DIAGNOSIS — K7581 Nonalcoholic steatohepatitis (NASH): Secondary | ICD-10-CM

## 2023-03-27 DIAGNOSIS — R5383 Other fatigue: Secondary | ICD-10-CM | POA: Diagnosis not present

## 2023-03-27 DIAGNOSIS — F411 Generalized anxiety disorder: Secondary | ICD-10-CM

## 2023-03-27 DIAGNOSIS — E119 Type 2 diabetes mellitus without complications: Secondary | ICD-10-CM | POA: Diagnosis not present

## 2023-03-27 DIAGNOSIS — I10 Essential (primary) hypertension: Secondary | ICD-10-CM | POA: Diagnosis not present

## 2023-03-27 DIAGNOSIS — R202 Paresthesia of skin: Secondary | ICD-10-CM | POA: Diagnosis not present

## 2023-03-27 DIAGNOSIS — Z23 Encounter for immunization: Secondary | ICD-10-CM

## 2023-03-27 DIAGNOSIS — E782 Mixed hyperlipidemia: Secondary | ICD-10-CM | POA: Diagnosis not present

## 2023-03-27 DIAGNOSIS — Z9109 Other allergy status, other than to drugs and biological substances: Secondary | ICD-10-CM

## 2023-03-27 DIAGNOSIS — F331 Major depressive disorder, recurrent, moderate: Secondary | ICD-10-CM

## 2023-03-27 DIAGNOSIS — F900 Attention-deficit hyperactivity disorder, predominantly inattentive type: Secondary | ICD-10-CM

## 2023-03-27 LAB — POCT GLYCOSYLATED HEMOGLOBIN (HGB A1C)
HbA1c, POC (controlled diabetic range): 6.2 % (ref 0.0–7.0)
Hemoglobin A1C: 6.2 % — AB (ref 4.0–5.6)

## 2023-03-27 LAB — POCT UA - MICROALBUMIN
Albumin/Creatinine Ratio, Urine, POC: <30
Creatinine, POC: 50 mg/dL
Microalbumin Ur, POC: 10 mg/L

## 2023-03-27 MED ORDER — LOSARTAN POTASSIUM 50 MG PO TABS: 50.0000 mg | ORAL_TABLET | Freq: Every evening | ORAL | 3 refills | Status: DC

## 2023-03-27 MED ORDER — IPRATROPIUM BROMIDE 0.03 % NA SOLN
NASAL | 2 refills | Status: DC
Start: 2023-03-27 — End: 2023-09-25

## 2023-03-27 MED ORDER — LEVOCETIRIZINE DIHYDROCHLORIDE 5 MG PO TABS
5.0000 mg | ORAL_TABLET | Freq: Every evening | ORAL | 3 refills | Status: DC
Start: 2023-03-27 — End: 2024-04-04

## 2023-03-27 NOTE — Progress Notes (Signed)
        Established patient visit  History, exam, impression, and plan:  1. Essential hypertension Pleasant 57 year old female presenting today with a history of HTN. She has been taking losartan 50mg  daily although her dosing over the last week or so has been sporadic. Not regularly checking blood pressures. Denies concerning symptoms. Cardiopulmonary exam normal. Checking labs. Continue Losartan as prescribed.  - CBC with Differential/Platelet - CMP14+EGFR - Lipid panel - losartan (COZAAR) 50 MG tablet; Take 1 tablet (50 mg total) by mouth every evening.  Dispense: 90 tablet; Refill: 3  2. NASH (nonalcoholic steatohepatitis) Not currently on lipid lowering medications. Aware of recommendations for weight loss and low fat diet.  - CMP14+EGFR  3. Hypothyroidism, unspecified type Not taking any thyroid replacement. Endorses fatigue but no other concerning symptoms. Checking TSH.   4. Type 2 diabetes mellitus without complication, without long-term current use of insulin (HCC) History of T2DM current controlled with diet and herbal supplements. Not regularly checking sugars. POCT A1c 6.2% indicating still controlled. POCT Microalbumin normal. Foot exam completed. Faxing for eye exam. Continue with diet/lifestyle modifications and herbal management.  - POCT HgB A1C - POCT UA - Microalbumin - CMP14+EGFR - HM Diabetes Foot Exam  5. Attention deficit hyperactivity disorder (ADHD), predominantly inattentive type 6. GAD (generalized anxiety disorder) 7. Moderate episode of recurrent major depressive disorder (HCC) Managed by online psychiatry.   8. Mixed hyperlipidemia Not currently on lipid lowering medication. Checking labs today.  - CMP14+EGFR - Lipid panel  9. Need for influenza vaccination Flu shot given in office today.  - Flu vaccine trivalent PF, 6mos and older(Flulaval,Afluria,Fluarix,Fluzone)  10. Environmental allergies Has been taking Xyzal 5mg  daily and using Atrovent as  needed. Doing well on this. Refilling meds today.  - ipratropium (ATROVENT) 0.03 % nasal spray; PLACE 2 SPRAYS INTO BOTH NOSTRILS TWO TIMES DAILY  Dispense: 30 mL; Refill: 2 - levocetirizine (XYZAL) 5 MG tablet; Take 1 tablet (5 mg total) by mouth every evening.  Dispense: 90 tablet; Refill: 3  11. Fatigue, unspecified type Continues to experience significant fatigue. Would like to have additional lab evaluation. Checking labs as below.  - VITAMIN D 25 Hydroxy (Vit-D Deficiency, Fractures) - Vitamin B12 - Folate - Iron, TIBC and Ferritin Panel  12. Paresthesia of lower extremity Has some  numbness in the lower extremities. Checking labs as below.  - VITAMIN D 25 Hydroxy (Vit-D Deficiency, Fractures) - Vitamin B12  Procedures performed this visit: None.  Return in about 6 months (around 09/24/2023) for chronic disease follow up.  __________________________________ Thayer Ohm, DNP, APRN, FNP-BC Primary Care and Sports Medicine [facility]

## 2023-03-28 LAB — IRON,TIBC AND FERRITIN PANEL
Ferritin: 79 ng/mL (ref 15–150)
Iron Saturation: 23 % (ref 15–55)
Iron: 81 ug/dL (ref 27–159)
Total Iron Binding Capacity: 348 ug/dL (ref 250–450)
UIBC: 267 ug/dL (ref 131–425)

## 2023-03-28 LAB — CBC WITH DIFFERENTIAL/PLATELET
Basophils Absolute: 0.1 10*3/uL (ref 0.0–0.2)
Basos: 1 %
EOS (ABSOLUTE): 0.2 10*3/uL (ref 0.0–0.4)
Eos: 3 %
Hematocrit: 43.4 % (ref 34.0–46.6)
Hemoglobin: 14.6 g/dL (ref 11.1–15.9)
Immature Grans (Abs): 0 10*3/uL (ref 0.0–0.1)
Immature Granulocytes: 0 %
Lymphocytes Absolute: 1.5 10*3/uL (ref 0.7–3.1)
Lymphs: 22 %
MCH: 30.6 pg (ref 26.6–33.0)
MCHC: 33.6 g/dL (ref 31.5–35.7)
MCV: 91 fL (ref 79–97)
Monocytes Absolute: 0.7 10*3/uL (ref 0.1–0.9)
Monocytes: 10 %
Neutrophils Absolute: 4.4 10*3/uL (ref 1.4–7.0)
Neutrophils: 64 %
Platelets: 304 10*3/uL (ref 150–450)
RBC: 4.77 x10E6/uL (ref 3.77–5.28)
RDW: 11.8 % (ref 11.7–15.4)
WBC: 7 10*3/uL (ref 3.4–10.8)

## 2023-03-28 LAB — CMP14+EGFR
ALT: 51 IU/L — ABNORMAL HIGH (ref 0–32)
AST: 28 IU/L (ref 0–40)
Albumin: 4.2 g/dL (ref 3.8–4.9)
Alkaline Phosphatase: 138 IU/L — ABNORMAL HIGH (ref 44–121)
BUN/Creatinine Ratio: 18 (ref 9–23)
BUN: 14 mg/dL (ref 6–24)
Bilirubin Total: 0.4 mg/dL (ref 0.0–1.2)
CO2: 21 mmol/L (ref 20–29)
Calcium: 9.5 mg/dL (ref 8.7–10.2)
Chloride: 103 mmol/L (ref 96–106)
Creatinine, Ser: 0.78 mg/dL (ref 0.57–1.00)
Globulin, Total: 2.7 g/dL (ref 1.5–4.5)
Glucose: 108 mg/dL — ABNORMAL HIGH (ref 70–99)
Potassium: 4.2 mmol/L (ref 3.5–5.2)
Sodium: 142 mmol/L (ref 134–144)
Total Protein: 6.9 g/dL (ref 6.0–8.5)
eGFR: 90 mL/min/{1.73_m2} (ref >59)

## 2023-03-28 LAB — LIPID PANEL
Chol/HDL Ratio: 6.4 ratio — ABNORMAL HIGH (ref 0.0–4.4)
Cholesterol, Total: 256 mg/dL — ABNORMAL HIGH (ref 100–199)
HDL: 40 mg/dL
LDL Chol Calc (NIH): 149 mg/dL — ABNORMAL HIGH (ref 0–99)
Triglycerides: 362 mg/dL — ABNORMAL HIGH (ref 0–149)
VLDL Cholesterol Cal: 67 mg/dL — ABNORMAL HIGH (ref 5–40)

## 2023-03-28 LAB — FOLATE: Folate: 7.6 ng/mL

## 2023-03-28 LAB — VITAMIN D 25 HYDROXY (VIT D DEFICIENCY, FRACTURES): Vit D, 25-Hydroxy: 28.8 ng/mL — ABNORMAL LOW (ref 30.0–100.0)

## 2023-03-28 LAB — TSH: TSH: 1.25 u[IU]/mL (ref 0.450–4.500)

## 2023-03-28 LAB — VITAMIN B12: Vitamin B-12: 960 pg/mL (ref 232–1245)

## 2023-03-29 ENCOUNTER — Ambulatory Visit (INDEPENDENT_AMBULATORY_CARE_PROVIDER_SITE_OTHER): Payer: 59

## 2023-03-29 DIAGNOSIS — Z1231 Encounter for screening mammogram for malignant neoplasm of breast: Secondary | ICD-10-CM | POA: Diagnosis not present

## 2023-03-29 DIAGNOSIS — G4733 Obstructive sleep apnea (adult) (pediatric): Secondary | ICD-10-CM | POA: Diagnosis not present

## 2023-04-11 DIAGNOSIS — G4733 Obstructive sleep apnea (adult) (pediatric): Secondary | ICD-10-CM | POA: Diagnosis not present

## 2023-04-28 DIAGNOSIS — G4733 Obstructive sleep apnea (adult) (pediatric): Secondary | ICD-10-CM | POA: Diagnosis not present

## 2023-05-18 ENCOUNTER — Encounter: Payer: Self-pay | Admitting: Sports Medicine

## 2023-05-18 ENCOUNTER — Other Ambulatory Visit (INDEPENDENT_AMBULATORY_CARE_PROVIDER_SITE_OTHER): Payer: 59

## 2023-05-18 ENCOUNTER — Ambulatory Visit: Payer: 59 | Admitting: Sports Medicine

## 2023-05-18 DIAGNOSIS — M25511 Pain in right shoulder: Secondary | ICD-10-CM | POA: Diagnosis not present

## 2023-05-18 DIAGNOSIS — G8929 Other chronic pain: Secondary | ICD-10-CM

## 2023-05-18 DIAGNOSIS — M7531 Calcific tendinitis of right shoulder: Secondary | ICD-10-CM

## 2023-05-18 DIAGNOSIS — M62838 Other muscle spasm: Secondary | ICD-10-CM | POA: Diagnosis not present

## 2023-05-18 MED ORDER — BUPIVACAINE HCL 0.25 % IJ SOLN
2.0000 mL | INTRAMUSCULAR | Status: AC | PRN
Start: 2023-05-18 — End: 2023-05-18
  Administered 2023-05-18: 2 mL via INTRA_ARTICULAR

## 2023-05-18 MED ORDER — METHYLPREDNISOLONE ACETATE 40 MG/ML IJ SUSP
40.0000 mg | INTRAMUSCULAR | Status: AC | PRN
Start: 2023-05-18 — End: 2023-05-18
  Administered 2023-05-18: 40 mg via INTRA_ARTICULAR

## 2023-05-18 MED ORDER — LIDOCAINE HCL 1 % IJ SOLN
2.0000 mL | INTRAMUSCULAR | Status: AC | PRN
Start: 2023-05-18 — End: 2023-05-18
  Administered 2023-05-18: 2 mL

## 2023-05-18 NOTE — Progress Notes (Signed)
Patient says that her right shoulder has been painful for a long time but in the last couple of months has become more localized to the front of the shoulder. She says that it is keeping her from sleeping and she is unable to lift her arm overhead, even for daily tasks.   Patient was instructed in 10 minutes of therapeutic exercises for right shoulder to improve strength, ROM and function according to my instructions and plan of care by a Certified Athletic Trainer during the office visit. A customized handout was provided and demonstration of proper technique shown and discussed. Patient did perform exercises and demonstrate understanding through teachback.  All questions discussed and answered.

## 2023-05-18 NOTE — Progress Notes (Signed)
Melanie [name] - 57 y.o. female MRN [id number]  Date of birth: Apr 15, 1966  Office Visit Note: Visit Date: 05/18/2023 PCP: Christen Butter, NP Referred by: Christen Butter, NP  Subjective: Chief Complaint  Patient presents with   Right Shoulder - Pain   HPI: Melanie [name] is a pleasant 57 y.o. female who presents today for evaluation of acute on chronic right shoulder pain.  Luanne has had issues with the right shoulder for a few years now, but here over the last few months her pain has been exacerbated.  Bothering her more so over the anterior lateral shoulder.  This is keeping her from sleeping and she has difficulty lifting the arm in certain positions, specifically overhead.  In the past she has been treated and seen for calcific tendinopathy of the rotator cuff.   She has had 1 or 2 injections under ultrasound guidance in this location.  In the past we did do a few sessions of shockwave therapy with some relief.  Pertinent ROS were reviewed with the patient and found to be negative unless otherwise specified above in HPI.   Assessment & Plan: Visit Diagnoses:  1. Calcific tendinitis of right shoulder   2. Chronic right shoulder pain   3. Trapezius muscle spasm    Plan: Impression is acute on chronic right shoulder pain with evidence of calcific tendinopathy which has been exacerbated over the past few weeks to months.  X-rays show a calcification within the rotator cuff interval which has increased in size by about 3 mm compared to x-rays about 1.5 years ago.  We discussed all treatment options such as oral medication, physical therapy, injection therapy, extracorporal shockwave therapy, surgical removal and debridement of calcification.  Given the degree of her pain, we did proceed with subacromial joint injection which she tolerated well.  She preferred home exercises compared to formalized physical therapy, we did print out a customized handout for her and my athletic  trainer Isabelle Course did review these in the room with her.  She is to perform these once daily.  Hopefully this will help offload her hypertonicity and spasming of the right trapezius musculature as well.  She will follow-up in about 5-6 weeks for reevaluation, additional considerations may include shockwave therapy.  Her PCP did write her a few tablets of tramadol 50 mg which she will take only as needed for breakthrough pain.  We discussed the role of trialing Celebrex or indomethacin, she will let me know if she is interested in trying either 1 of these in addition.  She did provide paperwork for [employer] regarding her disability from previous visits and today's visit, I did spend time filling out this paperwork, we will fax this as appropriate.  Follow-up: Return in about 5 weeks (around 06/22/2023) for for R-shoulder f/u (30-min appt).   Meds & Orders: No orders of the defined types were placed in this encounter.   Orders Placed This Encounter  Procedures   Large Joint Inj   XR Shoulder Right     Procedures: Large Joint Inj: R subacromial bursa on 05/18/2023 10:38 AM Indications: pain Details: 22 G 1.5 in needle, posterior approach Medications: 2 mL lidocaine 1 %; 2 mL bupivacaine 0.25 %; 40 mg methylPREDNISolone acetate 40 MG/ML Outcome: tolerated well, no immediate complications  Subacromial Joint Injection, Right Shoulder After discussion on risks/benefits/indications, informed verbal consent was obtained. A timeout was then performed. Patient was seated on table in exam room. The patient's shoulder was prepped with betadine and  alcohol swabs and utilizing posterior approach a 22G, 1.5" needle was directed anteriorly and laterally into the patient's subacromial space was injected with 2:2:1 mixture of lidocaine:bupivicaine:depomedrol with appreciation of free-flowing of the injectate into the bursal space. Patient tolerated the procedure well without immediate complications.   Procedure,  treatment alternatives, risks and benefits explained, specific risks discussed. Consent was given by the patient. Immediately prior to procedure a time out was called to verify the correct patient, procedure, equipment, support staff and site/side marked as required. Patient was prepped and draped in the usual sterile fashion.          Clinical History: No specialty comments available.  She reports that she has never smoked. She has been exposed to tobacco smoke. She has never used smokeless tobacco.  Recent Labs    09/02/22 1256 03/27/23 1126  HGBA1C 6.1* 6.2  6.2*    Objective:    Physical Exam  Gen: Well-appearing, in no acute distress; non-toxic CV:  Well-perfused. Warm.  Resp: Breathing unlabored on room air; no wheezing. Psych: Fluid speech in conversation; appropriate affect; normal thought process Neuro: Sensation intact throughout. No gross coordination deficits.   Ortho Exam - Right shoulder: + TTP at Codman's point as well as some over the mid trapezius with right-sided trapezius hypertonicity and slight elevation compared to the contralateral side.  There is full active and passive range of motion about the shoulder in all directions although pain at endrange flexion and abduction.  Positive Hawkins impingement test, there is some pain with resisted abduction and empty can testing, although no gross weakness of the rotator cuff testing.  Imaging: XR Shoulder Right  Result Date: 05/18/2023 3 views of the right shoulder including AP, Grashey, scapular Y were ordered and reviewed by myself.  X-rays show the humeral head well located within the glenohumeral joint.  There is a large area of calcification just off the greater tubercle of the humeral head.  This is approximately 12 mm in diameter, when comparing this to previous x-rays from 2023 this appears to be about 3 mm larger.  No acute fracture or significant arthritic change noted throughout the shoulder.   *Review and  interpretation of three-view right shoulder x-ray from 12/23/2021 shows evidence of calcific tendinopathy with a calcification off the greater tubercle.  This is about 9 mm in diameter at its longest point.  Comparing this to today's x-rays, it does appear it has increased about 3 mm in size.  Narrative & Impression  CLINICAL DATA:  Chronic right shoulder pain.   EXAM: RIGHT SHOULDER - 2+ VIEW   COMPARISON:  None Available.   FINDINGS: There is no evidence of fracture or dislocation. There is no evidence of arthropathy or other focal bone abnormality. A 6.2 mm area of focal soft tissue calcification is seen adjacent to the greater tubercle of the right humeral head.   IMPRESSION: 1. No acute osseous abnormality. 2. Findings suggestive of mild calcific tendinopathy along the greater tubercle of the right humeral head.     Electronically Signed   By: Aram Candela [name]   On: 12/23/2021 01:16    Past Medical/Family/Surgical/Social History: Medications & Allergies reviewed per EMR, new medications updated. Patient Active Problem List   Diagnosis Date Noted   Allergic conjunctivitis of both eyes 05/25/2022   Acute medial meniscus tear of right knee    Plica syndrome of knee, right    Calcific tendinitis of right shoulder 12/24/2021   Carpal tunnel syndrome, right 06/23/2021  HLD (hyperlipidemia) 04/29/2020   Type 2 diabetes mellitus without complication, without long-term current use of insulin (HCC) 04/24/2020   Tenosynovitis, de Quervain 06/24/2019   Primary osteoarthritis of first carpometacarpal joint of right hand 03/18/2019   Chronic right shoulder pain 03/18/2019   Bilateral foot pain 03/18/2019   Primary osteoarthritis of both knees 03/18/2019   Fibromyalgia 03/15/2019   Lumbar degenerative disc disease 12/19/2018   Myalgia 11/27/2018   Morning stiffness of joints 11/27/2018   DDD (degenerative disc disease), cervical 11/27/2018   Attention deficit hyperactivity  disorder (ADHD), predominantly inattentive type 02/27/2018   Arthralgia 02/27/2018   Hypothyroidism 02/27/2018   NASH (nonalcoholic steatohepatitis) 02/27/2018   GAD (generalized anxiety disorder) 01/06/2017   Moderate episode of recurrent major depressive disorder (HCC) 01/06/2017   Environmental allergies 09/12/2015   Essential hypertension 09/12/2015   Past Medical History:  Diagnosis Date   ADHD (attention deficit hyperactivity disorder)    Arthritis    Bowel obstruction (HCC)    Constipation 07/01/2020   Diabetes mellitus without complication (HCC)    Diverticulosis    Fibromyalgia    H/O: hysterectomy 02/27/2018   History of prediabetes 02/27/2018   Hypertension    Post-operative nausea and vomiting    SBO (small bowel obstruction) (HCC) 04/29/2020   Sleep apnea    uses CPAP   Family History  Problem Relation Age of Onset   Diabetes Mother    Skin cancer Mother    Lung cancer Father    Skin cancer Sister    Colon cancer Neg Hx    Colon polyps Neg Hx    Esophageal cancer Neg Hx    Rectal cancer Neg Hx    Stomach cancer Neg Hx    Past Surgical History:  Procedure Laterality Date   ABDOMINAL HYSTERECTOMY     ANTERIOR CERVICAL DECOMP/DISCECTOMY FUSION N/A 02/02/2022   Procedure: ANTERIOR CERVICAL DECOMPRESSION AND FUSION CERVICAL 5- CERVICAL 6, CERVICAL 6- CERVICAL 7 WITH INSTRUMENTATION AND ALLOGRAFT;  Surgeon: Estill Bamberg, MD;  Location: [facility] OR;  Service: Orthopedics;  Laterality: N/A;   COLONOSCOPY  20 years ago    in [place]=diverticulosis    KNEE ARTHROSCOPY WITH EXCISION PLICA Right 01/18/2022   Procedure: RIGHT KNEE ARTHROSCOPY WITH EXCISION PLICA;  Surgeon: Huel Cote, MD;  Location: [facility];  Service: Orthopedics;  Laterality: Right;   LAPAROSCOPY     due to bowel perforation   MYOMECTOMY     OOPHORECTOMY     unilateral   Social History   Occupational History   Not on file  Tobacco Use   Smoking status: Never    Passive  exposure: Past   Smokeless tobacco: Never  Vaping Use   Vaping status: Never Used  Substance and Sexual Activity   Alcohol use: Yes    Comment: Occassionally   Drug use: Never   Sexual activity: Not Currently    Birth control/protection: Surgical

## 2023-05-29 DIAGNOSIS — G4733 Obstructive sleep apnea (adult) (pediatric): Secondary | ICD-10-CM | POA: Diagnosis not present

## 2023-06-22 ENCOUNTER — Ambulatory Visit: Payer: 59 | Admitting: Sports Medicine

## 2023-06-26 DIAGNOSIS — M1711 Unilateral primary osteoarthritis, right knee: Secondary | ICD-10-CM | POA: Diagnosis not present

## 2023-06-28 DIAGNOSIS — G4733 Obstructive sleep apnea (adult) (pediatric): Secondary | ICD-10-CM | POA: Diagnosis not present

## 2023-07-04 DIAGNOSIS — G4733 Obstructive sleep apnea (adult) (pediatric): Secondary | ICD-10-CM | POA: Diagnosis not present

## 2023-07-31 IMAGING — MG MM DIGITAL SCREENING BILAT W/ TOMO AND CAD
8 series · 8 of 24 positions shown · non-contrast
Comparison: Previous exam(s).

CLINICAL DATA: Screening.

EXAM:
DIGITAL SCREENING BILATERAL MAMMOGRAM WITH TOMOSYNTHESIS AND CAD
TECHNIQUE: Bilateral screening digital craniocaudal and mediolateral oblique
mammograms were obtained. Bilateral screening digital breast
tomosynthesis was performed. The images were evaluated with
computer-aided detection.

[L MLO synth-2D]
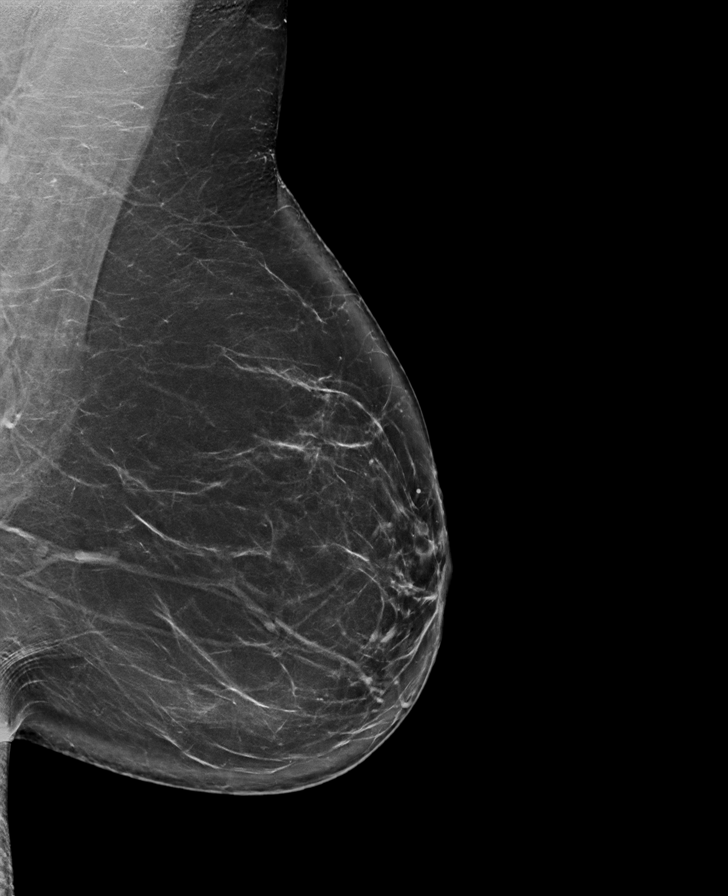

[R CC synth-2D]
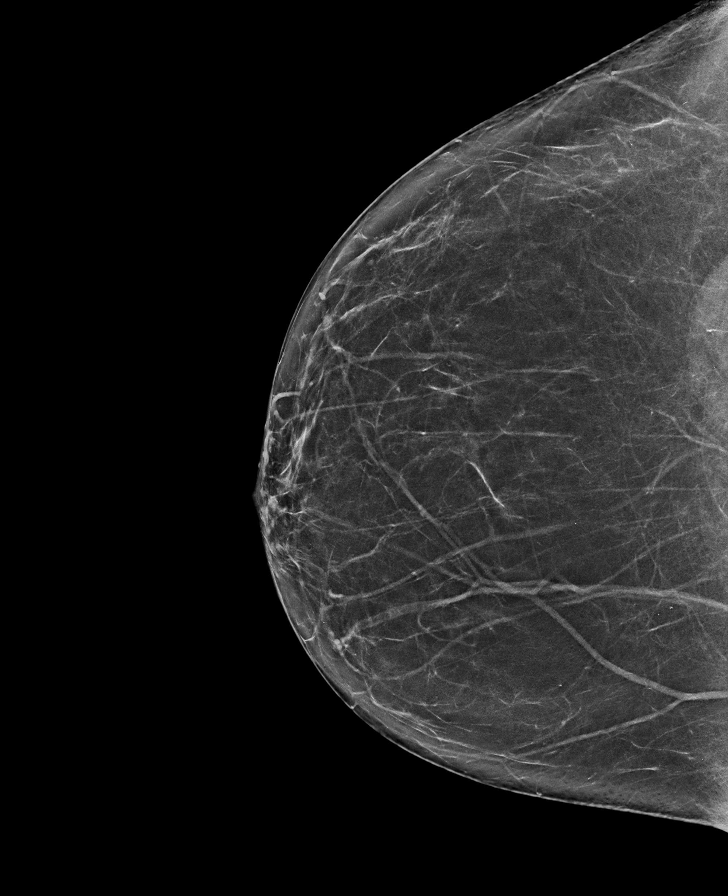

[L CC synth-2D]
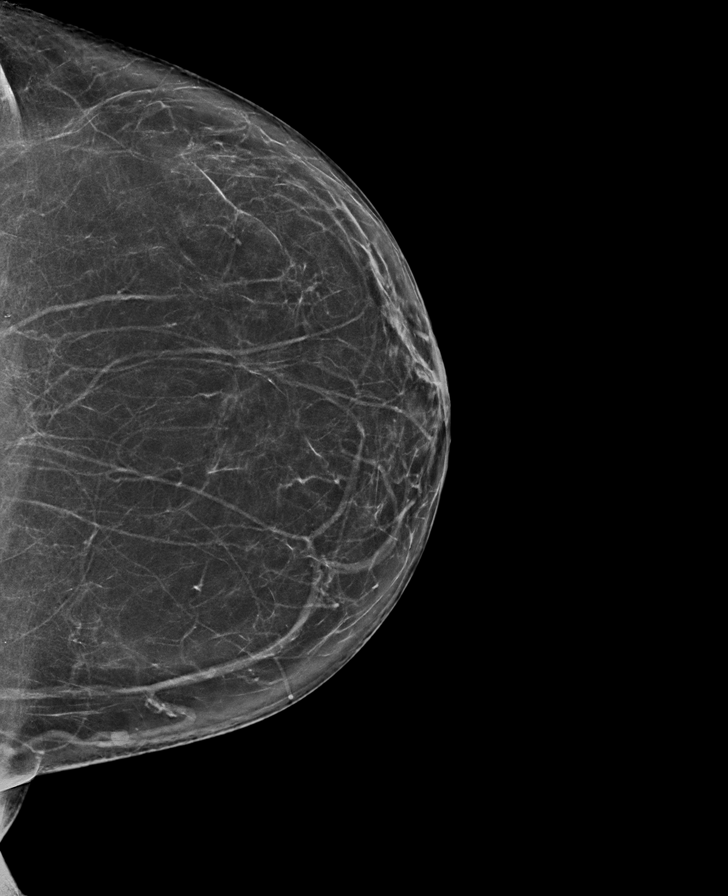

[R MLO synth-2D]
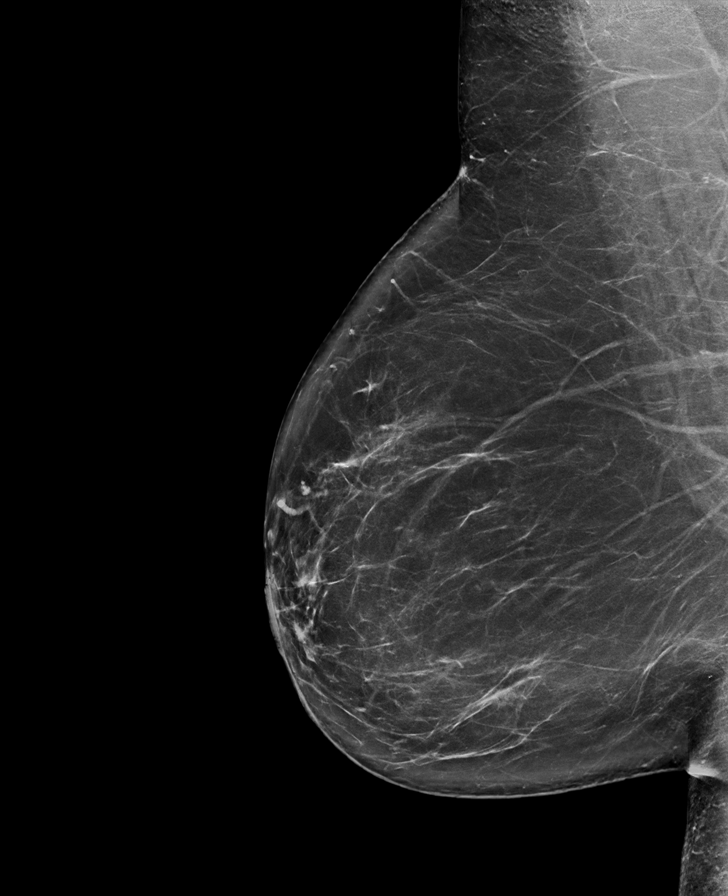

[L MLO tomo · tomo slice 47/92.0]
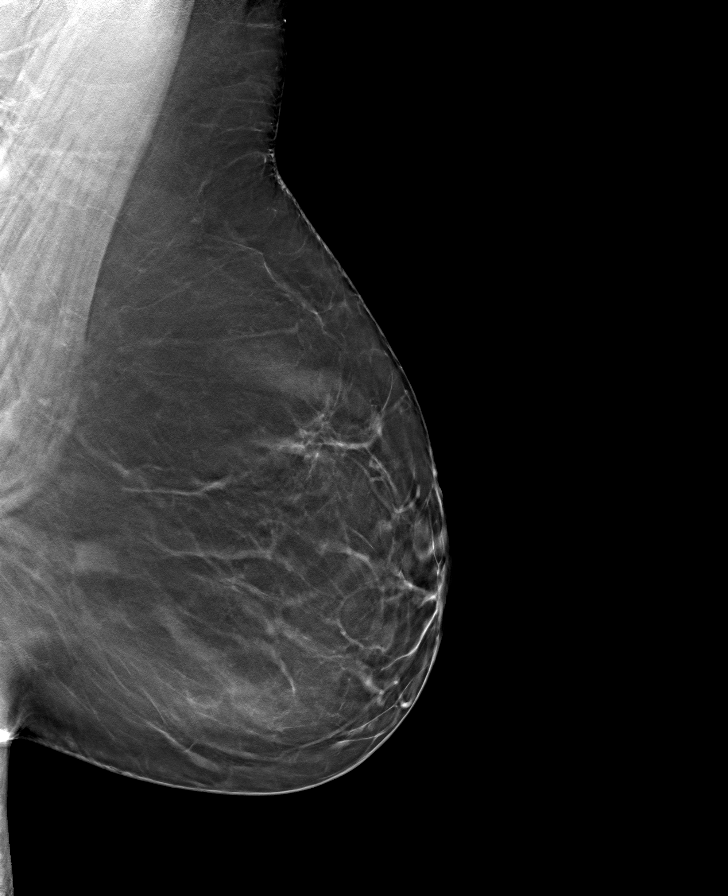

[R CC tomo · tomo slice 37/74.0]
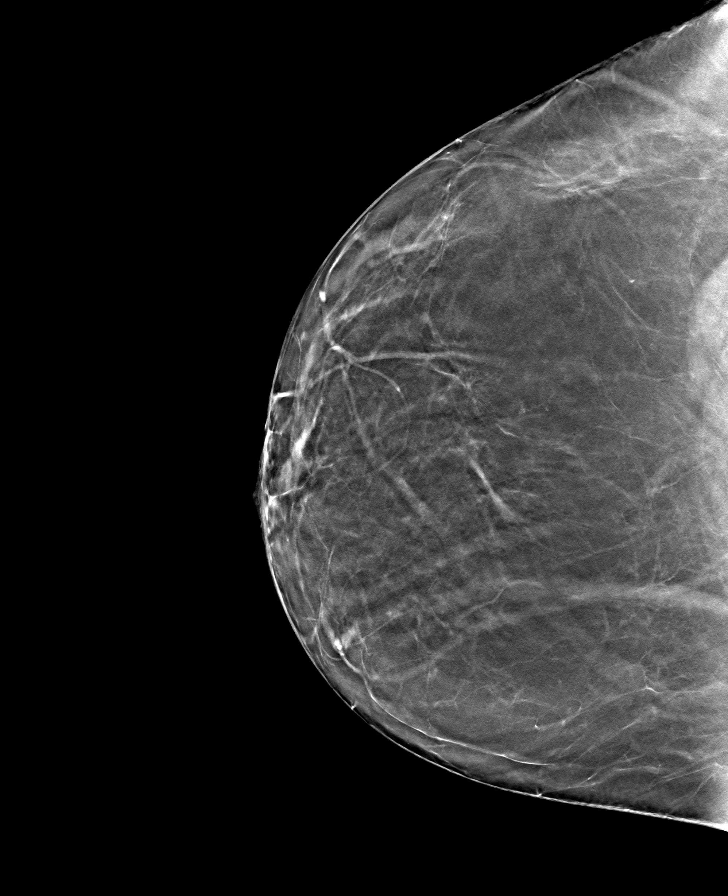

[R MLO tomo · tomo slice 45/90.0]
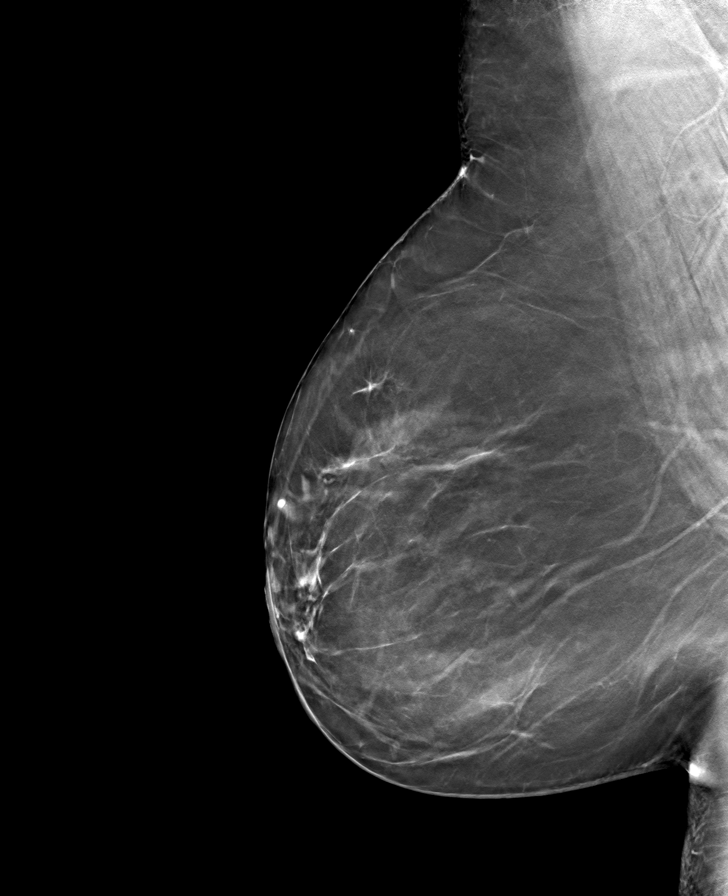

[L CC tomo · tomo slice 39/76.0]
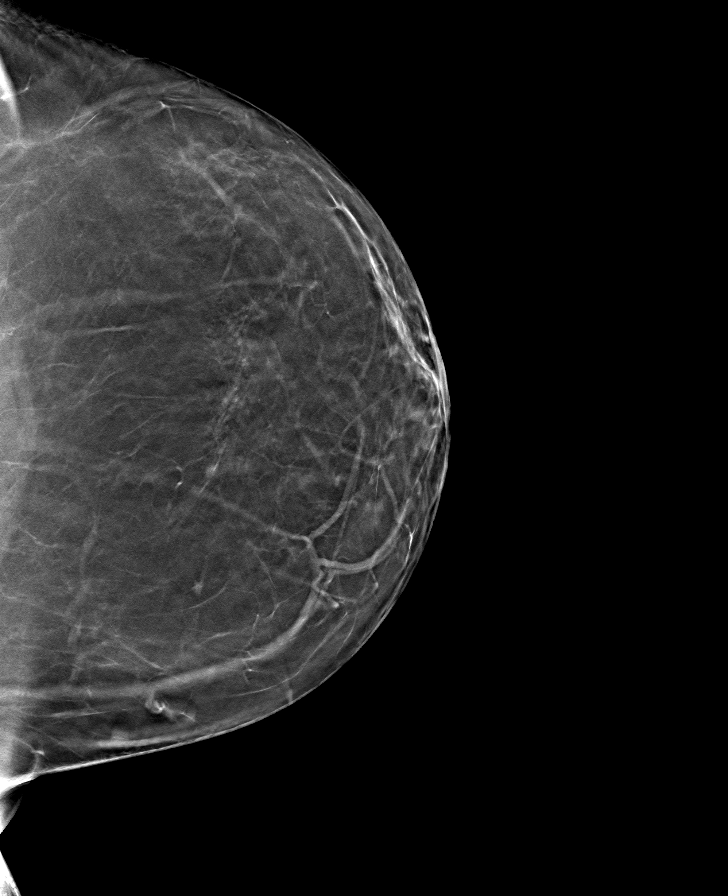

[8 of 24 positions shown; findings below may reference images not displayed]

ACR Breast Density Category b: There are scattered areas of
fibroglandular density.
FINDINGS: There are no findings suspicious for malignancy.
IMPRESSION: No mammographic evidence of malignancy. A result letter of this
screening mammogram will be mailed directly to the patient.

RECOMMENDATION:
Screening mammogram in one year. (Code:51-O-LD2)

BI-RADS CATEGORY  1: Negative.

## 2023-08-29 LAB — HM DIABETES EYE EXAM

## 2023-09-25 ENCOUNTER — Other Ambulatory Visit (HOSPITAL_COMMUNITY): Payer: Self-pay

## 2023-09-25 ENCOUNTER — Encounter: Payer: Self-pay | Admitting: Orthopedic Surgery

## 2023-09-25 ENCOUNTER — Other Ambulatory Visit (INDEPENDENT_AMBULATORY_CARE_PROVIDER_SITE_OTHER): Payer: Self-pay

## 2023-09-25 ENCOUNTER — Telehealth: Payer: Self-pay

## 2023-09-25 ENCOUNTER — Ambulatory Visit: Admitting: Orthopedic Surgery

## 2023-09-25 ENCOUNTER — Encounter: Admitting: Orthopedic Surgery

## 2023-09-25 ENCOUNTER — Ambulatory Visit (INDEPENDENT_AMBULATORY_CARE_PROVIDER_SITE_OTHER): Payer: 59 | Admitting: Medical-Surgical

## 2023-09-25 ENCOUNTER — Encounter: Payer: Self-pay | Admitting: Medical-Surgical

## 2023-09-25 VITALS — BP 139/78 | HR 74 | Resp 20 | Ht 66.25 in | Wt 225.7 lb

## 2023-09-25 DIAGNOSIS — E119 Type 2 diabetes mellitus without complications: Secondary | ICD-10-CM

## 2023-09-25 DIAGNOSIS — F900 Attention-deficit hyperactivity disorder, predominantly inattentive type: Secondary | ICD-10-CM | POA: Diagnosis not present

## 2023-09-25 DIAGNOSIS — M79601 Pain in right arm: Secondary | ICD-10-CM

## 2023-09-25 DIAGNOSIS — Z9109 Other allergy status, other than to drugs and biological substances: Secondary | ICD-10-CM

## 2023-09-25 DIAGNOSIS — E039 Hypothyroidism, unspecified: Secondary | ICD-10-CM

## 2023-09-25 DIAGNOSIS — F411 Generalized anxiety disorder: Secondary | ICD-10-CM

## 2023-09-25 DIAGNOSIS — G4733 Obstructive sleep apnea (adult) (pediatric): Secondary | ICD-10-CM

## 2023-09-25 DIAGNOSIS — M79604 Pain in right leg: Secondary | ICD-10-CM

## 2023-09-25 DIAGNOSIS — F331 Major depressive disorder, recurrent, moderate: Secondary | ICD-10-CM

## 2023-09-25 DIAGNOSIS — N951 Menopausal and female climacteric states: Secondary | ICD-10-CM | POA: Insufficient documentation

## 2023-09-25 DIAGNOSIS — I10 Essential (primary) hypertension: Secondary | ICD-10-CM | POA: Diagnosis not present

## 2023-09-25 LAB — POCT GLYCOSYLATED HEMOGLOBIN (HGB A1C)
HbA1c, POC (controlled diabetic range): 6.1 % (ref 0.0–7.0)
Hemoglobin A1C: 6.1 % — AB (ref 4.0–5.6)

## 2023-09-25 MED ORDER — TRAMADOL HCL 50 MG PO TABS
50.0000 mg | ORAL_TABLET | Freq: Two times a day (BID) | ORAL | 0 refills | Status: DC | PRN
  Filled 2023-09-25 – 2023-09-26 (×2): qty 20, 10d supply, fill #0

## 2023-09-25 MED ORDER — ESTRADIOL 0.5 MG PO TABS: 0.5000 mg | ORAL_TABLET | Freq: Every day | ORAL | 1 refills | Status: DC

## 2023-09-25 MED ORDER — HYDROXYZINE PAMOATE 25 MG PO CAPS
25.0000 mg | ORAL_CAPSULE | Freq: Three times a day (TID) | ORAL | 0 refills | Status: DC | PRN
Start: 2023-09-25 — End: 2024-02-26

## 2023-09-25 MED ORDER — AMPHETAMINE-DEXTROAMPHETAMINE 10 MG PO TABS
10.0000 mg | ORAL_TABLET | Freq: Two times a day (BID) | ORAL | 0 refills | Status: DC
Start: 2023-09-25 — End: 2023-10-24

## 2023-09-25 MED ORDER — GABAPENTIN 100 MG PO CAPS
100.0000 mg | ORAL_CAPSULE | Freq: Three times a day (TID) | ORAL | 0 refills | Status: DC
  Filled 2023-09-25 – 2023-09-26 (×2): qty 90, 30d supply, fill #0

## 2023-09-25 MED ORDER — IPRATROPIUM BROMIDE 0.03 % NA SOLN: NASAL | 2 refills | Status: DC

## 2023-09-25 NOTE — Progress Notes (Signed)
 Office Visit Note   Patient: Melanie [name]           Date of Birth: [birth year]           MRN: [id number] Visit Date: 09/25/2023 Requested by: Christen Butter, NP [address] [place],  [place] [address] PCP: Christen Butter, NP  Subjective: Chief Complaint  Patient presents with   Right Shoulder - Pain    HPI: Melanie [name] is a 58 y.o. female who presents to the office reporting right arm pain and right knee pain.  Patient has had multiple treatments from multiple providers.  She did most recently had a right subacromial injection performed 05/18/2023 from which she did receive some relief.  She also reports numbness and tingling in the hand and wrist as well as right sided radicular type pain.  Also describes anterior shoulder pain.  Describes difficulty with ADLs.  Complains of right upper extremity weakness.  She states it is "too much effort to raise my right arm".  Also describes axillary pain as well as scapular pain on that side.  She cannot sleep on the right-hand side.  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  Patient did not have any neck injections prior to surgery.  Overall she states that the surgery did help with the incapacitating pain she was having.  Patient also describes right knee pain.  Patient has a history of prior right knee arthroscopic surgery which she states made her knee worse.  She did have gel injection over 6 months ago which gave her good relief.  She has known arthritis in that right knee..                ROS: All systems reviewed are negative as they relate to the chief complaint within the history of present illness.  Patient denies fevers or chills.  Assessment & Plan: Visit Diagnoses:  1. Pain in right leg   2. Right arm pain     Plan: Impression is right arm pain which is likely coming both from radiculopathy from the neck as well as possible symptoms from the calcific  tendinitis.  I think it is more likely that any symptoms below the elbow are coming from her neck.  Symptoms ongoing for well over a year with 2 prior injections.  The injection performed "into the calcium deposit" was not helpful.  Subacromial injection was more helpful.  MRI arthrogram of the right shoulder is indicated to evaluate the extent of this calcific tendinitis as well as possible other pain generators in the shoulder.  She has failed physical therapy for this as well.  She also needs MRI scan of her cervical spine to evaluate adjacent segment disease and right-sided radiculopathy.  She is having significant symptoms below the elbow very similar to what she had prior to her surgery.  Right knee has good range of motion but mild effusion today.  I think a gel injection has proven beneficial to her in the past.  Trying to delay knee replacement as long as possible.  We will try to get her preapproved for gel shot as well as try her on some Voltaren for 1 to 2 weeks.  We will also try gabapentin 100 mg 3 times a day as well as Voltaren for 2 weeks only.  Tramadol also prescribed to help sleep at night.  Follow-up after her MRI scans. This patient is diagnosed with  osteoarthritis of the knee(s).    Radiographs show evidence of joint space narrowing, osteophytes, subchondral sclerosis and/or subchondral cysts.  This patient has knee pain which interferes with functional and activities of daily living.    This patient has experienced inadequate response, adverse effects and/or intolerance with conservative treatments such as acetaminophen, NSAIDS, topical creams, physical therapy or regular exercise, knee bracing and/or weight loss.   This patient has experienced inadequate response or has a contraindication to intra articular steroid injections for at least 3 months.   This patient is not scheduled to have a total knee replacement within 6 months of starting treatment with  viscosupplementation.   Follow-Up Instructions: No follow-ups on file.   Orders:  Orders Placed This Encounter  Procedures   XR Cervical Spine 2 or 3 views   XR Shoulder Right   MR Cervical Spine w/o contrast   MR SHOULDER RIGHT W CONTRAST   Arthrogram   Meds ordered this encounter  Medications   traMADol (ULTRAM) 50 MG tablet    Sig: Take 1 tablet (50 mg total) by mouth every 12 (twelve) hours as needed.    Dispense:  20 tablet    Refill:  0   gabapentin (NEURONTIN) 100 MG capsule    Sig: Take 1 capsule (100 mg total) by mouth 3 (three) times daily.    Dispense:  90 capsule    Refill:  0      Procedures: No procedures performed   Clinical Data: No additional findings.  Objective: Vital Signs: There were no vitals taken for this visit.  Physical Exam:  Constitutional: Patient appears well-developed HEENT:  Head: Normocephalic Eyes:EOM are normal Neck: Normal range of motion Cardiovascular: Normal rate Pulmonary/chest: Effort normal Neurologic: Patient is alert Skin: Skin is warm Psychiatric: Patient has normal mood and affect  Ortho Exam: Ortho exam demonstrates pretty reasonable cervical spine range of motion flexion chin to chest extension about 45 degrees rotation 50 degrees bilaterally.  Patient has 5 out of 5 grip EPL FPL interosseous wrist lection extension bicep triceps and deltoid strength.  No definite paresthesias C5-T1.  Radial pulse intact bilaterally.  Both shoulders have good range of motion of 60/100/165.  Rotator cuff strength intact bilaterally to infraspinatus supraspinatus and subscap muscle testing.  No discrete AC joint tenderness to direct palpation on the right or left-hand side.  No masses lymphadenopathy or skin changes noted in that shoulder girdle region.  Right knee has full range of motion with mild effusion and medial joint line tenderness.  Patella tracks well with moderate crepitus present with active and passive range of motion.  No  groin pain with internal/external Tatian of the leg.  Collateral crucial ligaments are stable.  Specialty Comments:  No specialty comments available.  Imaging: XR Cervical Spine 2 or 3 views Result Date: 09/25/2023 AP lateral radiographs cervical spine reviewed.  Plate fixation and fusion is present of C5-6 and C6-7.  Normal cervical overdoses is present.  Minimal adjacent segment disease.  XR Shoulder Right Result Date: 09/25/2023 AP outlet axillary lateral radiographs right shoulder reviewed.  No acute fracture.  Shoulder is located.  Acromiohumeral distance is normal.  No significant degenerative changes in the glenohumeral or AC joint.  Visualized lung fields clear.  Calcific tendinitis is noted at the supraspinatus attachment site on the greater tuberosity.    PMFS History: Patient Active Problem List   Diagnosis Date Noted   Menopausal symptoms 09/25/2023   Allergic conjunctivitis of both eyes 05/25/2022   Acute  medial meniscus tear of right knee    Plica syndrome of knee, right    Calcific tendinitis of right shoulder 12/24/2021   Carpal tunnel syndrome, right 06/23/2021   HLD (hyperlipidemia) 04/29/2020   Type 2 diabetes mellitus without complication, without long-term current use of insulin (HCC) 04/24/2020   Tenosynovitis, de Quervain 06/24/2019   Primary osteoarthritis of first carpometacarpal joint of right hand 03/18/2019   Chronic right shoulder pain 03/18/2019   Bilateral foot pain 03/18/2019   Primary osteoarthritis of both knees 03/18/2019   Fibromyalgia 03/15/2019   Lumbar degenerative disc disease 12/19/2018   Myalgia 11/27/2018   Morning stiffness of joints 11/27/2018   DDD (degenerative disc disease), cervical 11/27/2018   Attention deficit hyperactivity disorder (ADHD), predominantly inattentive type 02/27/2018   Arthralgia 02/27/2018   Hypothyroidism 02/27/2018   NASH (nonalcoholic steatohepatitis) 02/27/2018   GAD (generalized anxiety disorder)  01/06/2017   Moderate episode of recurrent major depressive disorder (HCC) 01/06/2017   Environmental allergies 09/12/2015   Essential hypertension 09/12/2015   OSA on CPAP 09/12/2015   Past Medical History:  Diagnosis Date   ADHD (attention deficit hyperactivity disorder)    Arthritis    Bowel obstruction (HCC)    Constipation 07/01/2020   Diabetes mellitus without complication (HCC)    Diverticulosis    Fibromyalgia    H/O: hysterectomy 02/27/2018   History of prediabetes 02/27/2018   Hypertension    Post-operative nausea and vomiting    SBO (small bowel obstruction) (HCC) 04/29/2020   Sleep apnea    uses CPAP    Family History  Problem Relation Age of Onset   Diabetes Mother    Skin cancer Mother    Lung cancer Father    Skin cancer Sister    Colon cancer Neg Hx    Colon polyps Neg Hx    Esophageal cancer Neg Hx    Rectal cancer Neg Hx    Stomach cancer Neg Hx     Past Surgical History:  Procedure Laterality Date   ANTERIOR CERVICAL DECOMP/DISCECTOMY FUSION N/A 02/02/2022   Procedure: ANTERIOR CERVICAL DECOMPRESSION AND FUSION CERVICAL 5- CERVICAL 6, CERVICAL 6- CERVICAL 7 WITH INSTRUMENTATION AND ALLOGRAFT;  Surgeon: Estill Bamberg, MD;  Location: [facility] OR;  Service: Orthopedics;  Laterality: N/A;   COLONOSCOPY  20 years ago    in [place]=diverticulosis    KNEE ARTHROSCOPY WITH EXCISION PLICA Right 01/18/2022   Procedure: RIGHT KNEE ARTHROSCOPY WITH EXCISION PLICA;  Surgeon: Huel Cote, MD;  Location: [facility];  Service: Orthopedics;  Laterality: Right;   LAPAROSCOPY     due to bowel perforation   MYOMECTOMY     OOPHORECTOMY     unilateral   TOTAL ABDOMINAL HYSTERECTOMY     Social History   Occupational History   Not on file  Tobacco Use   Smoking status: Never    Passive exposure: Past   Smokeless tobacco: Never  Vaping Use   Vaping status: Never Used  Substance and Sexual Activity   Alcohol use: Yes    Comment: Occassionally   Drug  use: Never   Sexual activity: Not Currently    Birth control/protection: Surgical

## 2023-09-25 NOTE — Telephone Encounter (Signed)
Auth needed for right knee gel  

## 2023-09-25 NOTE — Progress Notes (Unsigned)
        Established patient visit  History, exam, impression, and plan:  1. Essential hypertension (Primary) Pleasant 58 year old female presenting today with a history of hypertension currently managed with losartan 50 mg daily.  Tolerates the medication well without side effects.  Occasionally checking blood pressure at home.  Working to follow sodium diet.  At the as tolerated although this is greatly limited by her chronic pain. Denies CP, SOB, palpitations, lower extremity edema, dizziness, headaches, or vision changes. Cardiopulmonary exam. BP a little high today. Home readings and prior readings at goal so not med changes today. Continue Losartan 50mg  daily.   2. Type 2 diabetes mellitus without complication, without long-term current use of insulin (HCC) Diagnosed in 2021 with an A1c of 7.1%. Most recent A1c 6.2% about 6 months ago. Was taking Berberine but admits that she ran out and been off of this for "a while" but states that she knows she should get more and get back on it. Has been eating a lot healthier with cutting out sugars and carbs. Reports she is feeling better overall. Recheck of A1c today at 6.1% showing good control. Ok to restart Berberine. Continue dietary modifications.  - POCT HgB A1C  3. Environmental allergies Has seasonal allergies and has used Xyzal and Atrovent nasal spray with good results. Refilling today.  - ipratropium (ATROVENT) 0.03 % nasal spray; PLACE 2 SPRAYS INTO BOTH NOSTRILS TWO TIMES DAILY  Dispense: 30 mL; Refill: 2  4. Attention deficit hyperactivity disorder (ADHD), predominantly inattentive type Admits that she played around with her Adderall dosing last week at work to see if she could find a dose that was helpful without the concerning side effects. Has found that she tolerates the instant release Adderall at 10mg  twice daily. Would like to continue this dosing for now and see how it goes. Sending Adderall 10mg  BID as needed.   5. GAD 6. Major  depressive disorder Previously treated with Wellbutrin but feels that he did not tolerate this well due to increased irritability and anger.  Stopped the medication on her own.  Notes that she has not been taking anything outside of hydroxyzine but feels that the 50 mg dose is a little too much for her and would like to try lower dose.  Notes that she is caring for her mother which is very stressful and does not help with management of her mood.  Denies SI/HI.  Discontinue Wellbutrin and added to intolerance list.  Refilling hydroxyzine for 25 mg 3 times daily as needed for anxiety.  Procedures performed this visit: None.  Return in about 4 weeks (around 10/23/2023) for ADHD/estradiol follow up.  __________________________________ Thayer Ohm, DNP, APRN, FNP-BC Primary Care and Sports Medicine [facility] [place]

## 2023-09-26 ENCOUNTER — Other Ambulatory Visit: Payer: Self-pay

## 2023-09-26 ENCOUNTER — Other Ambulatory Visit (HOSPITAL_COMMUNITY): Payer: Self-pay

## 2023-09-26 MED ORDER — DICLOFENAC SODIUM 75 MG PO TBEC
75.0000 mg | DELAYED_RELEASE_TABLET | Freq: Two times a day (BID) | ORAL | 0 refills | Status: DC
  Filled 2023-09-26 (×2): qty 50, 25d supply, fill #0

## 2023-09-27 ENCOUNTER — Encounter: Payer: Self-pay | Admitting: Medical-Surgical

## 2023-10-03 ENCOUNTER — Encounter: Payer: Self-pay | Admitting: Sports Medicine

## 2023-10-03 ENCOUNTER — Encounter: Payer: Self-pay | Admitting: Orthopedic Surgery

## 2023-10-09 ENCOUNTER — Ambulatory Visit
Admission: RE | Admit: 2023-10-09 | Discharge: 2023-10-09 | Disposition: A | Discharge status: Home / Self Care | Source: Ambulatory Visit | Attending: Orthopedic Surgery | Admitting: Orthopedic Surgery

## 2023-10-09 DIAGNOSIS — M5412 Radiculopathy, cervical region: Secondary | ICD-10-CM | POA: Diagnosis not present

## 2023-10-09 DIAGNOSIS — Z981 Arthrodesis status: Secondary | ICD-10-CM | POA: Diagnosis not present

## 2023-10-09 DIAGNOSIS — M79601 Pain in right arm: Secondary | ICD-10-CM

## 2023-10-09 NOTE — Telephone Encounter (Signed)
 VOB submitted for Monovisc, right knee

## 2023-10-16 ENCOUNTER — Ambulatory Visit
Admission: RE | Admit: 2023-10-16 | Discharge: 2023-10-16 | Disposition: A | Discharge status: Home / Self Care | Source: Ambulatory Visit | Attending: Orthopedic Surgery | Admitting: Orthopedic Surgery

## 2023-10-16 DIAGNOSIS — M25511 Pain in right shoulder: Secondary | ICD-10-CM | POA: Diagnosis not present

## 2023-10-16 DIAGNOSIS — M79601 Pain in right arm: Secondary | ICD-10-CM

## 2023-10-16 DIAGNOSIS — M19011 Primary osteoarthritis, right shoulder: Secondary | ICD-10-CM | POA: Diagnosis not present

## 2023-10-16 DIAGNOSIS — M7531 Calcific tendinitis of right shoulder: Secondary | ICD-10-CM | POA: Diagnosis not present

## 2023-10-16 MED ORDER — IOPAMIDOL (ISOVUE-M 200) INJECTION 41%
10.0000 mL | Freq: Once | INTRAMUSCULAR | Status: AC
  Administered 2023-10-16: 10 mL via INTRA_ARTICULAR

## 2023-10-19 ENCOUNTER — Other Ambulatory Visit: Payer: Self-pay | Admitting: Medical-Surgical

## 2023-10-24 ENCOUNTER — Ambulatory Visit (INDEPENDENT_AMBULATORY_CARE_PROVIDER_SITE_OTHER): Admitting: Medical-Surgical

## 2023-10-24 ENCOUNTER — Telehealth: Payer: Self-pay | Admitting: Medical-Surgical

## 2023-10-24 VITALS — BP 131/85 | HR 70 | Wt 224.0 lb

## 2023-10-24 DIAGNOSIS — N951 Menopausal and female climacteric states: Secondary | ICD-10-CM | POA: Diagnosis not present

## 2023-10-24 DIAGNOSIS — F900 Attention-deficit hyperactivity disorder, predominantly inattentive type: Secondary | ICD-10-CM | POA: Diagnosis not present

## 2023-10-24 MED ORDER — AMPHETAMINE-DEXTROAMPHETAMINE 10 MG PO TABS: ORAL_TABLET | ORAL | 0 refills | Status: DC

## 2023-10-24 MED ORDER — ESTRADIOL 0.5 MG PO TABS: 0.5000 mg | ORAL_TABLET | Freq: Every day | ORAL | 1 refills | Status: DC

## 2023-10-24 NOTE — Progress Notes (Unsigned)
        Established patient visit  History, exam, impression, and plan:  1. Attention deficit hyperactivity disorder (ADHD), predominantly inattentive type (Primary) Takes 10 in the AM, sometimes a second 10 in the afternoon. Some positive benefit. Progress in task completion. No SE.   2. Menopausal symptoms Estradiol  doing well. Tolerating well, no SE.    Procedures performed this visit: None.  No follow-ups on file.  __________________________________ Maryl Snook, DNP, APRN, FNP-BC Primary Care and Sports Medicine [facility]

## 2023-10-24 NOTE — Telephone Encounter (Signed)
 Patient scheduled appointment for physical scheduled for 6/5 patient would like order to have labs draw

## 2023-10-25 ENCOUNTER — Encounter: Payer: Self-pay | Admitting: Medical-Surgical

## 2023-10-25 ENCOUNTER — Ambulatory Visit: Admitting: Orthopedic Surgery

## 2023-10-25 DIAGNOSIS — M7551 Bursitis of right shoulder: Secondary | ICD-10-CM

## 2023-10-25 DIAGNOSIS — M79601 Pain in right arm: Secondary | ICD-10-CM | POA: Diagnosis not present

## 2023-10-27 ENCOUNTER — Encounter: Payer: Self-pay | Admitting: Orthopedic Surgery

## 2023-10-27 MED ORDER — METHYLPREDNISOLONE ACETATE 40 MG/ML IJ SUSP
40.0000 mg | INTRAMUSCULAR | Status: AC | PRN
  Administered 2023-10-25: 40 mg via INTRA_ARTICULAR

## 2023-10-27 MED ORDER — LIDOCAINE HCL 1 % IJ SOLN
5.0000 mL | INTRAMUSCULAR | Status: AC | PRN
  Administered 2023-10-25: 5 mL

## 2023-10-27 MED ORDER — BUPIVACAINE HCL 0.5 % IJ SOLN
9.0000 mL | INTRAMUSCULAR | Status: AC | PRN
Start: 2023-10-25 — End: 2023-10-25
  Administered 2023-10-25: 9 mL via INTRA_ARTICULAR

## 2023-10-27 MED ORDER — HYDROCODONE-ACETAMINOPHEN 5-325 MG PO TABS
1.0000 | ORAL_TABLET | Freq: Four times a day (QID) | ORAL | 0 refills | Status: DC | PRN
  Filled 2023-10-27: qty 30, 8d supply, fill #0

## 2023-10-27 NOTE — Progress Notes (Signed)
 Office Visit Note   Patient: Melanie [name]           Date of Birth: 1966/02/19           MRN: [id number] Visit Date: 10/25/2023 Requested by: Cherre Cornish, NP [address] [place],  [place] [address] PCP: Cherre Cornish, NP  Subjective: Chief Complaint  Patient presents with   Other     Scan review    HPI: Benita Boonstra is a 58 y.o. female who presents to the office reporting severe worsening radicular type pain affecting the right arm and shoulder.  Affects primarily the anterior part of the shoulder as well as the trapezial region.  Hard for her to do anything overhead.  She states that her pain has been "crying level pain" for the last 2 days.  Since she was last seen she has had an MRI scan which shows previous ACDF C5-C7.  Good appearance with patency of the canal and foramina.Aaron Aas  MRI scan of the shoulder shows no full-thickness rotator cuff tear plus calcific tendinitis at the supraspinatus attachment site.  Biceps tendon looks intact and no evidence of adhesive capsulitis.              ROS: All systems reviewed are negative as they relate to the chief complaint within the history of present illness.  Patient denies fevers or chills.  Assessment & Plan: Visit Diagnoses:  1. Right arm pain     Plan: Impression is the shoulder MRI does not really look like findings that would give her this level of pain.  CT myelogram indicated to evaluate for right-sided radiculopathy.  I think there is some artifact on the MRI scan.  Subacromial injection performed today.  Refer to Dr. Daisey Dryer for C-spine injections for right-sided radiculopathy.  CT myelogram indicated for better evaluation of this problem.  Norco prescribed.  We need to see how she does between a subacromial injection and C-spine ESI.  Follow-Up Instructions: No follow-ups on file.   Orders:  Orders Placed This Encounter  Procedures   CT CERVICAL SPINE W CONTRAST   DG MYELOGRAPHY LUMBAR INJ  CERVICAL   Ambulatory referral to Physical Medicine Rehab   No orders of the defined types were placed in this encounter.     Procedures: Large Joint Inj: R subacromial bursa on 10/25/2023 7:47 PM Indications: diagnostic evaluation and pain Details: 18 G 1.5 in needle, posterior approach  Arthrogram: No  Medications: 9 mL bupivacaine  0.5 %; 40 mg methylPREDNISolone  acetate 40 MG/ML; 5 mL lidocaine  1 % Outcome: tolerated well, no immediate complications Procedure, treatment alternatives, risks and benefits explained, specific risks discussed. Consent was given by the patient. Immediately prior to procedure a time out was called to verify the correct patient, procedure, equipment, support staff and site/side marked as required. Patient was prepped and draped in the usual sterile fashion.       Clinical Data: No additional findings.  Objective: Vital Signs: There were no vitals taken for this visit.  Physical Exam:  Constitutional: Patient appears well-developed HEENT:  Head: Normocephalic Eyes:EOM are normal Neck: Normal range of motion Cardiovascular: Normal rate Pulmonary/chest: Effort normal Neurologic: Patient is alert Skin: Skin is warm Psychiatric: Patient has normal mood and affect  Ortho Exam: Ortho exam demonstrates good rotator cuff strength.  Does have pain with overhead motion on the right-hand side but not the left.  No discrete AC joint tenderness.  5 out of 5 grip EPL FPL interosseous  wrist lection extension bicep tricep and deltoid strength.  Abduction does seem to hurt her the most.  No restriction of external rotation asymmetrically at 15 degrees of abduction between the right and left side.  No definite paresthesias C5-T1.  Specialty Comments:  MRI CERVICAL SPINE WITHOUT CONTRAST   TECHNIQUE: Multiplanar, multisequence MR imaging of the cervical spine was performed. No intravenous contrast was administered.   COMPARISON:  11/13/2021    FINDINGS: Alignment: Normal   Vertebrae: Distant ACDF C5 through C7.  Other levels appear normal.   Cord: No cord compression or focal cord lesion.   Posterior Fossa, vertebral arteries, paraspinal tissues: Negative   Disc levels:   No abnormality from the foramen magnum through C2-3.   C3-4: Small central disc protrusion indents the ventral subarachnoid space but does not affect the cord or show foraminal extension. No change.   C4-5: Small central disc protrusion indents the ventral subarachnoid space but does not affect the cord or show foraminal extension. No change.   C5 through C7: Previous ACDF. Good appearance with apparent sufficient patency of the canal and foramina.   C7-T1: No disc abnormality. Minimal facet degeneration. No canal or foraminal stenosis.   IMPRESSION: 1. Previous ACDF C5 through C7. Good appearance with apparent sufficient patency of the canal and foramina. 2. Small central disc protrusions at C3-4 and C4-5 that indent the ventral subarachnoid space but do not affect the cord or show foraminal extension. No change since 11/13/2021. 3. No abnormality seen to explain the patient's right shoulder symptoms.     Electronically Signed   By: Bettylou Brunner [name]   On: 10/16/2023 15:43  Imaging: No results found.   PMFS History: Patient Active Problem List   Diagnosis Date Noted   Menopausal symptoms 09/25/2023   Allergic conjunctivitis of both eyes 05/25/2022   Acute medial meniscus tear of right knee    Plica syndrome of knee, right    Calcific tendinitis of right shoulder 12/24/2021   Carpal tunnel syndrome, right 06/23/2021   HLD (hyperlipidemia) 04/29/2020   Type 2 diabetes mellitus without complication, without long-term current use of insulin  (HCC) 04/24/2020   Tenosynovitis, de Quervain 06/24/2019   Primary osteoarthritis of first carpometacarpal joint of right hand 03/18/2019   Chronic right shoulder pain 03/18/2019   Bilateral  foot pain 03/18/2019   Primary osteoarthritis of both knees 03/18/2019   Fibromyalgia 03/15/2019   Lumbar degenerative disc disease 12/19/2018   Myalgia 11/27/2018   Morning stiffness of joints 11/27/2018   DDD (degenerative disc disease), cervical 11/27/2018   Attention deficit hyperactivity disorder (ADHD), predominantly inattentive type 02/27/2018   Arthralgia 02/27/2018   Hypothyroidism 02/27/2018   NASH (nonalcoholic steatohepatitis) 02/27/2018   GAD (generalized anxiety disorder) 01/06/2017   Moderate episode of recurrent major depressive disorder (HCC) 01/06/2017   Environmental allergies 09/12/2015   Essential hypertension 09/12/2015   OSA on CPAP 09/12/2015   Past Medical History:  Diagnosis Date   ADHD (attention deficit hyperactivity disorder)    Arthritis    Bowel obstruction (HCC)    Constipation 07/01/2020   Diabetes mellitus without complication (HCC)    Diverticulosis    Fibromyalgia    H/O: hysterectomy 02/27/2018   History of prediabetes 02/27/2018   Hypertension    Post-operative nausea and vomiting    SBO (small bowel obstruction) (HCC) 04/29/2020   Sleep apnea    uses CPAP    Family History  Problem Relation Age of Onset   Diabetes Mother    Skin  cancer Mother    Lung cancer Father    Skin cancer Sister    Colon cancer Neg Hx    Colon polyps Neg Hx    Esophageal cancer Neg Hx    Rectal cancer Neg Hx    Stomach cancer Neg Hx     Past Surgical History:  Procedure Laterality Date   ANTERIOR CERVICAL DECOMP/DISCECTOMY FUSION N/A 02/02/2022   Procedure: ANTERIOR CERVICAL DECOMPRESSION AND FUSION CERVICAL 5- CERVICAL 6, CERVICAL 6- CERVICAL 7 WITH INSTRUMENTATION AND ALLOGRAFT;  Surgeon: Virl Grimes, MD;  Location: [facility] OR;  Service: Orthopedics;  Laterality: N/A;   COLONOSCOPY  20 years ago    in [place]=diverticulosis    KNEE ARTHROSCOPY WITH EXCISION PLICA Right 01/18/2022   Procedure: RIGHT KNEE ARTHROSCOPY WITH EXCISION PLICA;  Surgeon: Wilhelmenia Harada, MD;  Location: [facility];  Service: Orthopedics;  Laterality: Right;   LAPAROSCOPY     due to bowel perforation   MYOMECTOMY     OOPHORECTOMY     unilateral   TOTAL ABDOMINAL HYSTERECTOMY     Social History   Occupational History   Not on file  Tobacco Use   Smoking status: Never    Passive exposure: Past   Smokeless tobacco: Never  Vaping Use   Vaping status: Never Used  Substance and Sexual Activity   Alcohol use: Yes    Comment: Occassionally   Drug use: Never   Sexual activity: Not Currently    Birth control/protection: Surgical

## 2023-10-28 ENCOUNTER — Other Ambulatory Visit (HOSPITAL_COMMUNITY): Payer: Self-pay

## 2023-10-30 ENCOUNTER — Other Ambulatory Visit: Payer: Self-pay

## 2023-11-01 ENCOUNTER — Ambulatory Visit: Admitting: Orthopedic Surgery

## 2023-11-07 ENCOUNTER — Other Ambulatory Visit (HOSPITAL_COMMUNITY): Payer: Self-pay

## 2023-11-23 ENCOUNTER — Ambulatory Visit: Payer: Self-pay | Admitting: Orthopedic Surgery

## 2023-11-23 NOTE — Progress Notes (Signed)
 Hi Lauren.  I called and left Novali a message.  In general I wanted to find out how she did after her subacromial injection.  Also I thought she was going to get set up for C-spine ESI but I do not think she has done that yet.  If she calls back I asked her to leave me a MyChart message so I can figure out how she is doing and where to go from there thanks

## 2023-11-24 NOTE — Telephone Encounter (Signed)
-----   Message from Marykay Snipes sent at 11/23/2023  5:41 PM EDT ----- Roxene Cora.  I called and left Casie a message.  In general I wanted to find out how she did after her subacromial injection.  Also I thought she was going to get set up for C-spine ESI but I do not think she has done that yet.  If she calls back  I asked her to leave me a MyChart message so I can figure out how she is doing and where to go from there thanks

## 2023-11-24 NOTE — Telephone Encounter (Signed)
 Dr Rozelle Corning called and LM

## 2023-12-07 ENCOUNTER — Encounter: Payer: Self-pay | Admitting: Medical-Surgical

## 2023-12-07 ENCOUNTER — Ambulatory Visit (INDEPENDENT_AMBULATORY_CARE_PROVIDER_SITE_OTHER): Admitting: Medical-Surgical

## 2023-12-07 VITALS — BP 127/80 | HR 68 | Resp 20 | Ht 66.25 in | Wt 222.5 lb

## 2023-12-07 DIAGNOSIS — E782 Mixed hyperlipidemia: Secondary | ICD-10-CM | POA: Diagnosis not present

## 2023-12-07 DIAGNOSIS — R5383 Other fatigue: Secondary | ICD-10-CM | POA: Diagnosis not present

## 2023-12-07 DIAGNOSIS — F900 Attention-deficit hyperactivity disorder, predominantly inattentive type: Secondary | ICD-10-CM

## 2023-12-07 DIAGNOSIS — Z0000 Encounter for general adult medical examination without abnormal findings: Secondary | ICD-10-CM

## 2023-12-07 DIAGNOSIS — E119 Type 2 diabetes mellitus without complications: Secondary | ICD-10-CM

## 2023-12-07 DIAGNOSIS — E66812 Obesity, class 2: Secondary | ICD-10-CM

## 2023-12-07 DIAGNOSIS — Z6835 Body mass index (BMI) 35.0-35.9, adult: Secondary | ICD-10-CM

## 2023-12-07 DIAGNOSIS — E559 Vitamin D deficiency, unspecified: Secondary | ICD-10-CM

## 2023-12-07 DIAGNOSIS — E349 Endocrine disorder, unspecified: Secondary | ICD-10-CM | POA: Diagnosis not present

## 2023-12-07 MED ORDER — AMPHETAMINE-DEXTROAMPHETAMINE 10 MG PO TABS: ORAL_TABLET | ORAL | 0 refills | Status: DC

## 2023-12-07 NOTE — Patient Instructions (Signed)
 Preventive Care 16-57 Years Old, Female  Preventive care refers to lifestyle choices and visits with your health care provider that can promote health and wellness. Preventive care visits are also called wellness exams.  What can I expect for my preventive care visit?  Counseling  Your health care provider may ask you questions about your:  Medical history, including:  Past medical problems.  Family medical history.  Pregnancy history.  Current health, including:  Menstrual cycle.  Method of birth control.  Emotional well-being.  Home life and relationship well-being.  Sexual activity and sexual health.  Lifestyle, including:  Alcohol, nicotine or tobacco, and drug use.  Access to firearms.  Diet, exercise, and sleep habits.  Work and work Astronomer.  Sunscreen use.  Safety issues such as seatbelt and bike helmet use.  Physical exam  Your health care provider will check your:  Height and weight. These may be used to calculate your BMI (body mass index). BMI is a measurement that tells if you are at a healthy weight.  Waist circumference. This measures the distance around your waistline. This measurement also tells if you are at a healthy weight and may help predict your risk of certain diseases, such as type 2 diabetes and high blood pressure.  Heart rate and blood pressure.  Body temperature.  Skin for abnormal spots.  What immunizations do I need?    Vaccines are usually given at various ages, according to a schedule. Your health care provider will recommend vaccines for you based on your age, medical history, and lifestyle or other factors, such as travel or where you work.  What tests do I need?  Screening  Your health care provider may recommend screening tests for certain conditions. This may include:  Lipid and cholesterol levels.  Diabetes screening. This is done by checking your blood sugar (glucose) after you have not eaten for a while (fasting).  Pelvic exam and Pap test.  Hepatitis B test.  Hepatitis C  test.  HIV (human immunodeficiency virus) test.  STI (sexually transmitted infection) testing, if you are at risk.  Lung cancer screening.  Colorectal cancer screening.  Mammogram. Talk with your health care provider about when you should start having regular mammograms. This may depend on whether you have a family history of breast cancer.  BRCA-related cancer screening. This may be done if you have a family history of breast, ovarian, tubal, or peritoneal cancers.  Bone density scan. This is done to screen for osteoporosis.  Talk with your health care provider about your test results, treatment options, and if necessary, the need for more tests.  Follow these instructions at home:  Eating and drinking    Eat a diet that includes fresh fruits and vegetables, whole grains, lean protein, and low-fat dairy products.  Take vitamin and mineral supplements as recommended by your health care provider.  Do not drink alcohol if:  Your health care provider tells you not to drink.  You are pregnant, may be pregnant, or are planning to become pregnant.  If you drink alcohol:  Limit how much you have to 0-1 drink a day.  Know how much alcohol is in your drink. In the [place] one drink equals one 12 oz bottle of beer (355 mL), one 5 oz glass of wine (148 mL), or one 1 oz glass of hard liquor (44 mL).  Lifestyle  Brush your teeth every morning and night with fluoride toothpaste. Floss one time each day.  Exercise for at least  30 minutes 5 or more days each week.  Do not use any products that contain nicotine or tobacco. These products include cigarettes, chewing tobacco, and vaping devices, such as e-cigarettes. If you need help quitting, ask your health care provider.  Do not use drugs.  If you are sexually active, practice safe sex. Use a condom or other form of protection to prevent STIs.  If you do not wish to become pregnant, use a form of birth control. If you plan to become pregnant, see your health care provider for a  prepregnancy visit.  Take aspirin only as told by your health care provider. Make sure that you understand how much to take and what form to take. Work with your health care provider to find out whether it is safe and beneficial for you to take aspirin daily.  Find healthy ways to manage stress, such as:  Meditation, yoga, or listening to music.  Journaling.  Talking to a trusted person.  Spending time with friends and family.  Minimize exposure to UV radiation to reduce your risk of skin cancer.  Safety  Always wear your seat belt while driving or riding in a vehicle.  Do not drive:  If you have been drinking alcohol. Do not ride with someone who has been drinking.  When you are tired or distracted.  While texting.  If you have been using any mind-altering substances or drugs.  Wear a helmet and other protective equipment during sports activities.  If you have firearms in your house, make sure you follow all gun safety procedures.  Seek help if you have been physically or sexually abused.  What's next?  Visit your health care provider once a year for an annual wellness visit.  Ask your health care provider how often you should have your eyes and teeth checked.  Stay up to date on all vaccines.  This information is not intended to replace advice given to you by your health care provider. Make sure you discuss any questions you have with your health care provider.  Document Revised: 12/16/2020 Document Reviewed: 12/16/2020  Elsevier Patient Education  2024 [employer].

## 2023-12-07 NOTE — Progress Notes (Signed)
 Complete physical exam  Patient: Melanie [name]   DOB: December 18, 1965   58 y.o. Female  MRN: [id number]  Subjective:     Chief Complaint  Patient presents with   Annual Exam   Melanie [name] is a 58 y.o. female who presents today for a complete physical exam. She reports consuming a general diet. Exercise is limited by orthopedic condition(s): multiple. She generally feels fairly well. She reports sleeping well. She does not have additional problems to discuss today.    Most recent fall risk assessment:    09/25/2023   10:51 AM  Fall Risk   Falls in the past year? 0  Number falls in past yr: 0  Injury with Fall? 0  Risk for fall due to : No Fall Risks  Follow up Falls evaluation completed     Most recent depression screenings:    09/25/2023   10:51 AM 03/27/2023   11:50 AM  PHQ 2/9 Scores  PHQ - 2 Score 2 2  PHQ- 9 Score 15 10    Vision:Within last year and Dental: No current dental problems and Receives regular dental care    Patient Care Team: Cherre Cornish, NP as PCP - General (Nurse Practitioner)   Outpatient Medications Prior to Visit  Medication Sig   acetaminophen  (TYLENOL ) 500 MG tablet Take 500 mg by mouth every 12 (twelve) hours as needed (pain).   AMBULATORY NON FORMULARY MEDICATION Continuous positive airway pressure (CPAP) machine set at Autopap (8-50mm of water pressure), with all supplemental supplies as needed.   amphetamine -dextroamphetamine  (ADDERALL) 10 MG tablet Take 2 tablets (20 mg total) by mouth daily with breakfast AND 1 tablet (10 mg total) daily in the afternoon.   [START ON 12/23/2023] amphetamine -dextroamphetamine  (ADDERALL) 10 MG tablet Take 2 tablets (20 mg total) by mouth daily with breakfast AND 1 tablet (10 mg total) daily in the afternoon.   amphetamine -dextroamphetamine  (ADDERALL) 10 MG tablet Take 2 tablets (20 mg total) by mouth daily with breakfast AND 1 tablet (10 mg total) daily in the afternoon.   blood glucose meter  kit and supplies KIT Dispense based on patient and insurance preference. Use up to four times daily as directed. Please include lancets, test strips, control solution.   diclofenac  (VOLTAREN ) 75 MG EC tablet Take 1 tablet (75 mg total) by mouth 2 (two) times daily.   estradiol  (ESTRACE ) 0.5 MG tablet Take 1 tablet (0.5 mg total) by mouth daily.   gabapentin  (NEURONTIN ) 100 MG capsule Take 1 capsule (100 mg total) by mouth 3 (three) times daily.   HYDROcodone -acetaminophen  (NORCO/VICODIN) 5-325 MG tablet Take 1 tablet by mouth every 6 (six) hours as needed for moderate pain (pain score 4-6).   hydrOXYzine  (VISTARIL ) 25 MG capsule Take 1 capsule (25 mg total) by mouth every 8 (eight) hours as needed.   ipratropium (ATROVENT ) 0.03 % nasal spray PLACE 2 SPRAYS INTO BOTH NOSTRILS TWO TIMES DAILY   levocetirizine (XYZAL ) 5 MG tablet Take 1 tablet (5 mg total) by mouth every evening.   Lido-Capsaicin -Men-Methyl Sal 0.5-0.035-5-20 % PTCH Apply 1 each topically 2 (two) times daily as needed.   losartan  (COZAAR ) 50 MG tablet Take 1 tablet (50 mg total) by mouth every evening.   OVER THE COUNTER MEDICATION Take 4 tablets by mouth every morning. Multivitamin with fish oil - gummies   PANTETHINE PO Take 450 mg by mouth daily.   Polyethylene Glycol 3350 (MIRALAX PO) Take by mouth.   traMADol  (ULTRAM ) 50 MG tablet Take 1  tablet (50 mg total) by mouth every 12 (twelve) hours as needed.   VITAMIN D  PO Take by mouth daily. With vitamin K   No facility-administered medications prior to visit.   Review of Systems  Constitutional:  Positive for malaise/fatigue. Negative for chills, fever and weight loss.  HENT:  Negative for congestion, ear pain, hearing loss, sinus pain and sore throat.   Eyes:  Negative for blurred vision, photophobia and pain.  Respiratory:  Negative for cough, shortness of breath and wheezing.   Cardiovascular:  Positive for palpitations. Negative for chest pain and leg swelling.   Gastrointestinal:  Positive for constipation. Negative for abdominal pain, diarrhea, heartburn, nausea and vomiting.  Genitourinary:  Negative for dysuria, frequency and urgency.  Musculoskeletal:  Positive for back pain, falls (1 last year), joint pain, myalgias and neck pain.  Skin:  Negative for itching and rash.  Neurological:  Positive for dizziness (postural) and tingling (bilateral hands). Negative for weakness and headaches.  Endo/Heme/Allergies:  Negative for polydipsia. Does not bruise/bleed easily.  Psychiatric/Behavioral:  Positive for depression. Negative for substance abuse and suicidal ideas. The patient is nervous/anxious. The patient does not have insomnia.      Objective:    BP 127/80 (BP Location: Left Arm, Cuff Size: Normal)   Pulse 68   Resp 20   Ht 5' 6.25" (1.683 m)   Wt 222 lb 8 oz (100.9 kg)   SpO2 97%   BMI 35.64 kg/m    Physical Exam Vitals reviewed.  Constitutional:      General: She is not in acute distress.    Appearance: Normal appearance. She is obese. She is not ill-appearing.  HENT:     Head: Normocephalic and atraumatic.     Right Ear: Tympanic membrane, ear canal and external ear normal. There is no impacted cerumen.     Left Ear: Tympanic membrane, ear canal and external ear normal. There is no impacted cerumen.     Nose: Nose normal. No congestion or rhinorrhea.     Mouth/Throat:     Mouth: Mucous membranes are moist.     Pharynx: No oropharyngeal exudate or posterior oropharyngeal erythema.  Eyes:     General: No scleral icterus.       Right eye: No discharge.        Left eye: No discharge.     Extraocular Movements: Extraocular movements intact.     Conjunctiva/sclera: Conjunctivae normal.     Pupils: Pupils are equal, round, and reactive to light.  Neck:     Thyroid : No thyromegaly.     Vascular: No carotid bruit or JVD.     Trachea: Trachea normal.  Cardiovascular:     Rate and Rhythm: Normal rate and regular rhythm.      Pulses: Normal pulses.     Heart sounds: Normal heart sounds. No murmur heard.    No friction rub. No gallop.  Pulmonary:     Effort: Pulmonary effort is normal. No respiratory distress.     Breath sounds: Normal breath sounds. No wheezing.  Abdominal:     General: Bowel sounds are normal. There is no distension.     Palpations: Abdomen is soft.     Tenderness: There is no abdominal tenderness. There is no guarding.  Musculoskeletal:        General: Normal range of motion.     Cervical back: Normal range of motion and neck supple.  Lymphadenopathy:     Cervical: No cervical adenopathy.  Skin:  General: Skin is warm and dry.  Neurological:     Mental Status: She is alert and oriented to person, place, and time.     Cranial Nerves: No cranial nerve deficit.  Psychiatric:        Mood and Affect: Mood normal.        Behavior: Behavior normal.        Thought Content: Thought content normal.        Judgment: Judgment normal.   No results found for any visits on 12/07/23.     Assessment & Plan:    Routine Health Maintenance and Physical Exam  Immunization History  Administered Date(s) Administered   DTaP 06/25/2010   Influenza, Seasonal, Injecte, Preservative Fre 04/13/2016, 03/27/2023   Influenza,inj,Quad PF,6+ Mos 04/23/2018, 03/14/2019, 04/10/2020   Influenza-Unspecified 03/26/2021   PFIZER(Purple Top)SARS-COV-2 Vaccination 07/06/2019, 07/24/2019, 04/03/2020   PPD Test 08/21/2009, 04/03/2017, 05/01/2017   Pneumococcal Polysaccharide-23 07/27/2020   Tdap 11/26/2015   Zoster Recombinant(Shingrix) 06/26/2019, 06/24/2020    Health Maintenance  Topic Date Due   Hepatitis C Screening  Never done   Pneumococcal Vaccine 28-42 Years old (2 of 2 - PCV) 07/27/2021   COVID-19 Vaccine (4 - 2024-25 season) 12/23/2023 (Originally 03/05/2023)   INFLUENZA VACCINE  02/02/2024   Diabetic kidney evaluation - eGFR measurement  03/26/2024   Diabetic kidney evaluation - Urine ACR   03/26/2024   FOOT EXAM  03/26/2024   HEMOGLOBIN A1C  03/27/2024   OPHTHALMOLOGY EXAM  08/28/2024   MAMMOGRAM  03/28/2025   DTaP/Tdap/Td (3 - Td or Tdap) 11/25/2025   Colonoscopy  07/01/2030   HIV Screening  Completed   Zoster Vaccines- Shingrix  Completed   HPV VACCINES  Aged Out   Meningococcal B Vaccine  Aged Out    Discussed health benefits of physical activity, and encouraged her to engage in regular exercise appropriate for her age and condition.  1. Annual physical exam (Primary) Checking labs as below. UTD on preventative care. Wellness information provided with AVS. - Lipid panel - CBC with Differential/Platelet - CMP14+EGFR  2. Mixed hyperlipidemia Checking lipids and CMP.  - Lipid panel - CMP14+EGFR  3. Attention deficit hyperactivity disorder (ADHD), predominantly inattentive type Continue Adderall as prescribed.   5. Vitamin D  insufficiency Checking Vitamin D .  - VITAMIN D  25 Hydroxy (Vit-D Deficiency, Fractures)  Return in about 1 year (around 12/06/2024) for annual physical exam or sooner if needed.   Angeletta Goelz, NP

## 2023-12-08 ENCOUNTER — Other Ambulatory Visit (HOSPITAL_COMMUNITY): Payer: Self-pay

## 2023-12-08 LAB — LIPID PANEL

## 2023-12-12 ENCOUNTER — Ambulatory Visit: Payer: Self-pay | Admitting: Medical-Surgical

## 2023-12-12 LAB — ESTRADIOL: Estradiol: 46.4 pg/mL

## 2023-12-13 LAB — CBC WITH DIFFERENTIAL/PLATELET
Basophils Absolute: 0.1 10*3/uL (ref 0.0–0.2)
Basos: 1 %
EOS (ABSOLUTE): 0.2 10*3/uL (ref 0.0–0.4)
Eos: 3 %
Hematocrit: 43.9 % (ref 34.0–46.6)
Hemoglobin: 15 g/dL (ref 11.1–15.9)
Immature Grans (Abs): 0 10*3/uL (ref 0.0–0.1)
Immature Granulocytes: 0 %
Lymphocytes Absolute: 1.4 10*3/uL (ref 0.7–3.1)
Lymphs: 30 %
MCH: 30.6 pg (ref 26.6–33.0)
MCHC: 34.2 g/dL (ref 31.5–35.7)
MCV: 90 fL (ref 79–97)
Monocytes Absolute: 0.6 10*3/uL (ref 0.1–0.9)
Monocytes: 12 %
Neutrophils Absolute: 2.5 10*3/uL (ref 1.4–7.0)
Neutrophils: 54 %
Platelets: 289 10*3/uL (ref 150–450)
RBC: 4.9 x10E6/uL (ref 3.77–5.28)
RDW: 12.1 % (ref 11.7–15.4)
WBC: 4.7 10*3/uL (ref 3.4–10.8)

## 2023-12-13 LAB — CMP14+EGFR
ALT: 35 IU/L — ABNORMAL HIGH (ref 0–32)
AST: 27 IU/L (ref 0–40)
Albumin: 4.4 g/dL (ref 3.8–4.9)
Alkaline Phosphatase: 123 IU/L — ABNORMAL HIGH (ref 44–121)
BUN/Creatinine Ratio: 15 (ref 9–23)
BUN: 12 mg/dL (ref 6–24)
Bilirubin Total: 0.3 mg/dL (ref 0.0–1.2)
CO2: 16 mmol/L — ABNORMAL LOW (ref 20–29)
Calcium: 9.2 mg/dL (ref 8.7–10.2)
Chloride: 105 mmol/L (ref 96–106)
Creatinine, Ser: 0.78 mg/dL (ref 0.57–1.00)
Globulin, Total: 2.6 g/dL (ref 1.5–4.5)
Glucose: 103 mg/dL — ABNORMAL HIGH (ref 70–99)
Potassium: 4.6 mmol/L (ref 3.5–5.2)
Sodium: 140 mmol/L (ref 134–144)
Total Protein: 7 g/dL (ref 6.0–8.5)
eGFR: 89 mL/min/{1.73_m2} (ref >59)

## 2023-12-13 LAB — CORTISOL: Cortisol: 6.5 ug/dL (ref 6.2–19.4)

## 2023-12-13 LAB — LIPID PANEL
Chol/HDL Ratio: 4.4 ratio (ref 0.0–4.4)
Cholesterol, Total: 229 mg/dL — ABNORMAL HIGH (ref 100–199)
HDL: 52 mg/dL (ref >39)
LDL Chol Calc (NIH): 148 mg/dL — ABNORMAL HIGH (ref 0–99)
Triglycerides: 160 mg/dL — ABNORMAL HIGH (ref 0–149)
VLDL Cholesterol Cal: 29 mg/dL (ref 5–40)

## 2023-12-13 LAB — TSH+T4F+T3FREE
Free T4: 1.2 ng/dL (ref 0.82–1.77)
T3, Free: 3.3 pg/mL (ref 2.0–4.4)
TSH: 2.27 u[IU]/mL (ref 0.450–4.500)

## 2023-12-13 LAB — VITAMIN D 25 HYDROXY (VIT D DEFICIENCY, FRACTURES): Vit D, 25-Hydroxy: 31.7 ng/mL (ref 30.0–100.0)

## 2023-12-13 LAB — DHEA: Dehydroepiandrosterone (DHEA): 39 ng/dL (ref 21–402)

## 2024-01-03 ENCOUNTER — Ambulatory Visit: Admitting: Physical Medicine and Rehabilitation

## 2024-01-03 ENCOUNTER — Other Ambulatory Visit: Payer: Self-pay

## 2024-01-03 VITALS — BP 112/78 | HR 96

## 2024-01-03 DIAGNOSIS — Z981 Arthrodesis status: Secondary | ICD-10-CM

## 2024-01-03 DIAGNOSIS — R202 Paresthesia of skin: Secondary | ICD-10-CM | POA: Diagnosis not present

## 2024-01-03 DIAGNOSIS — G8929 Other chronic pain: Secondary | ICD-10-CM | POA: Diagnosis not present

## 2024-01-03 DIAGNOSIS — M25511 Pain in right shoulder: Secondary | ICD-10-CM

## 2024-01-03 DIAGNOSIS — M546 Pain in thoracic spine: Secondary | ICD-10-CM | POA: Diagnosis not present

## 2024-01-03 DIAGNOSIS — G5601 Carpal tunnel syndrome, right upper limb: Secondary | ICD-10-CM

## 2024-01-03 DIAGNOSIS — M5412 Radiculopathy, cervical region: Secondary | ICD-10-CM

## 2024-01-03 DIAGNOSIS — M7918 Myalgia, other site: Secondary | ICD-10-CM

## 2024-01-03 MED ORDER — METHYLPREDNISOLONE ACETATE 40 MG/ML IJ SUSP
40.0000 mg | Freq: Once | INTRAMUSCULAR | Status: AC
  Administered 2024-01-03: 40 mg

## 2024-01-03 NOTE — Patient Instructions (Signed)

## 2024-01-03 NOTE — Progress Notes (Signed)
 Pain Scale   Average Pain 6 Patient advising she has chronic neck pain radiates to her Right shoulder area and down her right side.         +Driver, -BT, -Dye Allergies.

## 2024-01-15 NOTE — Progress Notes (Signed)
 Melanie [name] - 58 y.o. female MRN [id number]  Date of birth: [birth year]  Office Visit Note: Visit Date: 01/03/2024 PCP: Willo Mini, NP Referred by: Willo Mini, NP  Subjective: Chief Complaint  Patient presents with   Neck - Pain   HPI: Melanie [name] is a 58 y.o. female who comes in today For evaluation and management at the request of Dr. JUDITHANN Glendia Hutchinson for chronic, recalcitrant severe right neck shoulder and arm pain with paresthesia.  She was initially sent to us  for possible cervical epidural injection.  Patient is a Designer, jewellery and reports that through her research she really was not sure she wanted to have an injection at this point in her cervical spine.  By way of brief review of her history from the patient in the chart she initially saw Dr. Elspeth Parker in 2023 for the same right shoulder pain that was chronic and recalcitrant even at that point.  She was also having some hip pain at that time.  He did see her initially prior to her having cervical surgery.  He noted at the time that she had a calcific tendinitis of the right shoulder.  He also documented that she had had a prior cervical fusion by Dr. Oneil Priestly at [facility] orth[facility]ics.  She did have a C5-7 cervical ACDF in August 2023.  She had completed physical therapy for multiple rounds including dry needling excetra without relief.  She had multiple medication management without much relief.  She does not endorse that she had any cervical epidural injections prior to the cervical fusion.  There was an electrodiagnostic study performed by Dr. Tonita Blanch in 2022 that showed no radiculopathy at that time but did show a mild carpal tunnel syndrome or median neuropathy at the right wrist.  In regards to nerve issues she is type II diabetic and also hypothyroid.  Last hemoglobin A1c 6.1.  She continues to endorse pretty severe trapezial pain on the right worse with arm movement also some pain anteriorly in the  shoulder.  She does get paresthesias in the arm and hand.  These are more of a median nerve distribution or possibly C6 distribution.  Dr. Burnetta began seeing her after Dr. Parker and after treatment by him she was referred to Dr. Hutchinson for further workup of her shoulder.  He obtained MRI arthrogram of the shoulder as well as new MRI of the cervical spine.  MRI of the shoulder did show calcific tendinitis as well as mild AC joint arthritis.  No full-thickness tears of the rotator cuff.  No significant arthritis of the joint itself.  Cervical MRI showed very small central protrusions above the fusion without any stenosis or nerve compression excetra.  No real findings on MRI consistent with radicular type pain.  She rates her current pain as 6 out of 10.  Pain syndrome is complicated by history of fibromyalgia generalized anxiety disorder major depressive disorder and prior cervical surgery.   I spent more than 30 minutes speaking face-to-face with the patient with 50% of the time in counseling and discussing coordination of care.      Review of Systems  Musculoskeletal:  Positive for back pain, joint pain and neck pain.  Neurological:  Positive for tingling and focal weakness.  All other systems reviewed and are negative.  Otherwise per HPI.  Assessment & Plan: Visit Diagnoses:    ICD-10-CM   1. Cervical radiculopathy  M54.12 Epidural Steroid injection    methylPREDNISolone  acetate (DEPO-MEDROL )  injection 40 mg    CANCELED: XR C-ARM NO REPORT    2. S/P cervical spinal fusion  Z98.1     3. Chronic right shoulder pain  M25.511    G89.29     4. Chronic right-sided thoracic back pain  M54.6 Ambulatory referral to Physical Therapy   G89.29     5. Myofascial pain syndrome  M79.18 Ambulatory referral to Physical Therapy    6. Paresthesia of skin  R20.2 Ambulatory referral to Physical Medicine Rehab    7. Carpal tunnel syndrome, right  G56.01 Ambulatory referral to Physical Medicine Rehab        Plan: Findings:  Chronic, recalcitrant and severe neck shoulder arm pain with hand paresthesias which I think is just multifactorial in general.  I do think a lot of her hand complaints may be carpal tunnel syndrome that could have worsened since 2022 when it was mild.  I did tell her in that regard I be happy to do a nerve study of her right hand to see if it had worsened.  Depending on the results could have Dr. Burnetta look at carpal tunnel hydrodissection.  I also do think the myofascial trigger points are a significant source of pain for her.  I do think someone good dry needling could help her.  I have made a referral to physical therapy here in our office.  She has not had it done here that I know of.  One caveat would be underlying central sensitization pain syndrome that could interfere with treatment of the trigger points as some people were just sensitive to mechanical treatment of the muscles.  I agree with the patient and her training as a nurse that cervical epidural is probably not the right answer for her pain at this point.  I am not sure the risk benefit ratio is adequate.  She will continue to follow with Dr. Addie for her shoulder.  Depending on relief with dry needling I could look at potentially trigger point injection myself or through my nurse practitioner Duwaine Pouch.  Otherwise from a spine standpoint I do not really have much to offer.    Meds & Orders:  Meds ordered this encounter  Medications   methylPREDNISolone  acetate (DEPO-MEDROL ) injection 40 mg    Orders Placed This Encounter  Procedures   Ambulatory referral to Physical Therapy   Ambulatory referral to Physical Medicine Rehab   Epidural Steroid injection    Follow-up: No follow-ups on file.   Procedures: No procedures performed      Clinical History: MRI CERVICAL SPINE WITHOUT CONTRAST   TECHNIQUE: Multiplanar, multisequence MR imaging of the cervical spine was performed. No intravenous contrast  was administered.   COMPARISON:  11/13/2021   FINDINGS: Alignment: Normal   Vertebrae: Distant ACDF C5 through C7.  Other levels appear normal.   Cord: No cord compression or focal cord lesion.   Posterior Fossa, vertebral arteries, paraspinal tissues: Negative   Disc levels:   No abnormality from the foramen magnum through C2-3.   C3-4: Small central disc protrusion indents the ventral subarachnoid space but does not affect the cord or show foraminal extension. No change.   C4-5: Small central disc protrusion indents the ventral subarachnoid space but does not affect the cord or show foraminal extension. No change.   C5 through C7: Previous ACDF. Good appearance with apparent sufficient patency of the canal and foramina.   C7-T1: No disc abnormality. Minimal facet degeneration. No canal or foraminal stenosis.  IMPRESSION: 1. Previous ACDF C5 through C7. Good appearance with apparent sufficient patency of the canal and foramina. 2. Small central disc protrusions at C3-4 and C4-5 that indent the ventral subarachnoid space but do not affect the cord or show foraminal extension. No change since 11/13/2021. 3. No abnormality seen to explain the patient's right shoulder symptoms.     Electronically Signed   By: Oneil Officer [name]   On: 10/16/2023 15:43 --------------------------------------- 05/20/2021 Electrodiagnostic study of right upper extremity Impression: 1. 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   2. There is no evidence of a cervical radiculopathy affecting the right upper extremity.     ___________________________ Tonita Blanch, DO   She reports that she has never smoked. She has been exposed to tobacco smoke. She has never used smokeless tobacco.  Recent Labs    03/27/23 1126 09/25/23 1011  HGBA1C 6.2  6.2* 6.1  6.1*    Objective:  VS:  HT:    WT:   BMI:      BP:112/78  HR:96bpm  TEMP: ( )  RESP:  Physical Exam Vitals and nursing note reviewed.  Constitutional:      General: She is not in acute distress.    Appearance: Normal appearance. She is well-developed. She is obese. She is not ill-appearing.  HENT:     Head: Normocephalic and atraumatic.     Right Ear: External ear normal.     Left Ear: External ear normal.  Eyes:     Extraocular Movements: Extraocular movements intact.     Conjunctiva/sclera: Conjunctivae normal.     Pupils: Pupils are equal, round, and reactive to light.  Cardiovascular:     Rate and Rhythm: Normal rate.     Pulses: Normal pulses.  Pulmonary:     Effort: Pulmonary effort is normal.  Musculoskeletal:        General: Tenderness present.     Cervical back: Normal range of motion. Tenderness present. No rigidity.     Right lower leg: No edema.     Left lower leg: No edema.     Comments: Patient has good strength in the upper extremities including 5 out of 5 strength in wrist extension long finger flexion and APB.  There is no atrophy of the hands intrinsically.  There is a negative Hoffmann's test.  She has active trigger points to palpation in the right trapezius and levator scapula do reproduce a lot of her pain.  Much so less active on the left.  She does have painful range of motion of the right shoulder with some limitation in external rotation.  She has a positive Phalen's on the right.  Intact sensation in the hand.   Lymphadenopathy:     Cervical: No cervical adenopathy.  Skin:    General: Skin is warm and dry.     Findings: No erythema, lesion or rash.  Neurological:     General: No focal deficit present.     Mental Status: She is alert and oriented to person, place, and time.     Cranial Nerves: No cranial nerve deficit.     Sensory: No sensory deficit.     Motor: No weakness or abnormal muscle tone.     Coordination: Coordination normal.     Gait: Gait abnormal.  Psychiatric:        Mood and  Affect: Mood normal.        Behavior: Behavior  normal.     Ortho Exam  Imaging: No results found.  Past Medical/Family/Surgical/Social History: Medications & Allergies reviewed per EMR, new medications updated. Patient Active Problem List   Diagnosis Date Noted   Menopausal symptoms 09/25/2023   Allergic conjunctivitis of both eyes 05/25/2022   Acute medial meniscus tear of right knee    Plica syndrome of knee, right    Calcific tendinitis of right shoulder 12/24/2021   Carpal tunnel syndrome, right 06/23/2021   HLD (hyperlipidemia) 04/29/2020   Type 2 diabetes mellitus without complication, without long-term current use of insulin  (HCC) 04/24/2020   Tenosynovitis, de Quervain 06/24/2019   Primary osteoarthritis of first carpometacarpal joint of right hand 03/18/2019   Chronic right shoulder pain 03/18/2019   Bilateral foot pain 03/18/2019   Primary osteoarthritis of both knees 03/18/2019   Fibromyalgia 03/15/2019   Lumbar degenerative disc disease 12/19/2018   Myalgia 11/27/2018   Morning stiffness of joints 11/27/2018   DDD (degenerative disc disease), cervical 11/27/2018   Attention deficit hyperactivity disorder (ADHD), predominantly inattentive type 02/27/2018   Arthralgia 02/27/2018   Hypothyroidism 02/27/2018   NASH (nonalcoholic steatohepatitis) 02/27/2018   GAD (generalized anxiety disorder) 01/06/2017   Moderate episode of recurrent major depressive disorder (HCC) 01/06/2017   Environmental allergies 09/12/2015   Essential hypertension 09/12/2015   OSA on CPAP 09/12/2015   Past Medical History:  Diagnosis Date   ADHD (attention deficit hyperactivity disorder)    Arthritis    Bowel obstruction (HCC)    Constipation 07/01/2020   Diabetes mellitus without complication (HCC)    Diverticulosis    Fibromyalgia    H/O: hysterectomy 02/27/2018   History of prediabetes 02/27/2018   Hypertension    Post-operative nausea and vomiting    SBO (small bowel  obstruction) (HCC) 04/29/2020   Sleep apnea    uses CPAP   Family History  Problem Relation Age of Onset   Diabetes Mother    Skin cancer Mother    Lung cancer Father    Skin cancer Sister    Colon cancer Neg Hx    Colon polyps Neg Hx    Esophageal cancer Neg Hx    Rectal cancer Neg Hx    Stomach cancer Neg Hx    Past Surgical History:  Procedure Laterality Date   ANTERIOR CERVICAL DECOMP/DISCECTOMY FUSION N/A 02/02/2022   Procedure: ANTERIOR CERVICAL DECOMPRESSION AND FUSION CERVICAL 5- CERVICAL 6, CERVICAL 6- CERVICAL 7 WITH INSTRUMENTATION AND ALLOGRAFT;  Surgeon: Beuford Anes, MD;  Location: [facility] OR;  Service: Orthopedics;  Laterality: N/A;   COLONOSCOPY  20 years ago    in [place]=diverticulosis    KNEE ARTHROSCOPY WITH EXCISION PLICA Right 01/18/2022   Procedure: RIGHT KNEE ARTHROSCOPY WITH EXCISION PLICA;  Surgeon: Genelle Standing, MD;  Location: [facility];  Service: Orthopedics;  Laterality: Right;   LAPAROSCOPY     due to bowel perforation   MYOMECTOMY     OOPHORECTOMY     unilateral   TOTAL ABDOMINAL HYSTERECTOMY     Social History   Occupational History   Not on file  Tobacco Use   Smoking status: Never    Passive exposure: Past   Smokeless tobacco: Never  Vaping Use   Vaping status: Never Used  Substance and Sexual Activity   Alcohol use: Yes    Comment: Occassionally   Drug use: Never   Sexual activity: Not Currently    Birth control/protection: Surgical

## 2024-01-17 ENCOUNTER — Ambulatory Visit: Admitting: Family Medicine

## 2024-01-17 ENCOUNTER — Encounter: Payer: Self-pay | Admitting: Family Medicine

## 2024-01-17 VITALS — BP 124/84 | HR 84 | Ht 66.25 in | Wt 219.0 lb

## 2024-01-17 DIAGNOSIS — I951 Orthostatic hypotension: Secondary | ICD-10-CM

## 2024-01-17 DIAGNOSIS — M357 Hypermobility syndrome: Secondary | ICD-10-CM

## 2024-01-17 DIAGNOSIS — Q7962 Hypermobile Ehlers-Danlos syndrome: Secondary | ICD-10-CM | POA: Diagnosis not present

## 2024-01-17 DIAGNOSIS — M791 Myalgia, unspecified site: Secondary | ICD-10-CM

## 2024-01-17 NOTE — Progress Notes (Unsigned)
   I, Leotis Batter, CMA acting as a scribe for Artist Lloyd, MD.  Melanie [name] is a 58 y.o. female who presents to [facility] today for evaluation of her hypermobility. Pt was last seen by Dr. Lloyd on 12/14/21. Hx of cervical fusion in Aug 2023.  MS: chronic multiple myalgias, cervical radiculopathy, chronic shoulder pain, rib sublux,  Skin/Immune reactions: skin sensitivity to bug bites Nervous system: dizziness with sit-to-stand, N/T B UE and B feet, worsening balance Head/Spine: frequent HA,  CV: tachycardia,  GI: IBU, chronic constipation Genitourinary: hx of frequent UTI Hands & Feet: piezogenic papules of the feet  Pertinent review of systems: No fevers or chills  Relevant historical information: Hypertension and diabetes   Exam:  BP 124/84   Pulse 84   Ht 5' 6.25 (1.683 m)   Wt 219 lb (99.3 kg)   SpO2 98%   BMI 35.08 kg/m  General: Well Developed, well nourished, and in no acute distress.   MSK: Hypermobility evaluation is positive with a Beighton score of 5/9. Ehlers-Danlos evaluation is positive with 5 out of the 12 criteria needed including soft over the skin, mild skin hyperextensibility, unexplained striae, bilateral piezogenic papules, atrophic scarring      Assessment and Plan: 58 y.o. 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  Physical therapy should be quite helpful for this.  Will use a physical therapy location that is extremely knowledgeable for hypermobility syndrome and Ehlers-Danlos.  For now we will use integrative therapies.  This may be a bit of a change for her which I think would be helpful.  Additionally she has significant lightheadedness and dizziness symptoms with a history of syncope.  This would be secondary to dysautonomia/POTS which is commonly  associated with Erler's Danlos syndrome.  We talked about treatment options.  Will start with lifestyle modification compression and salt fluids and consider adding medication such as metoprolol or fludrocortisone.  Recheck back in 2 months.   PDMP not reviewed this encounter. Orders Placed This Encounter  Procedures   Ambulatory referral to Physical Therapy    Referral Priority:   Routine    Referral Type:   Physical Medicine    Referral Reason:   Specialty Services Required    Requested Specialty:   Physical Therapy    Number of Visits Requested:   1   No orders of the defined types were placed in this encounter.    Discussed warning signs or symptoms. Please see discharge instructions. Patient expresses understanding.   The above documentation has been reviewed and is accurate and complete Artist Lloyd, [name]

## 2024-01-17 NOTE — Patient Instructions (Addendum)
 Thank you for coming in today.   I've referred you to Physical Therapy.  Let us  know if you don't hear from them in one week.   Let me know about POTS  Check back in 2 months

## 2024-01-18 DIAGNOSIS — I951 Orthostatic hypotension: Secondary | ICD-10-CM | POA: Insufficient documentation

## 2024-01-18 DIAGNOSIS — Q7962 Hypermobile Ehlers-Danlos syndrome: Secondary | ICD-10-CM | POA: Insufficient documentation

## 2024-01-22 ENCOUNTER — Ambulatory Visit: Admitting: Orthopedic Surgery

## 2024-01-23 ENCOUNTER — Ambulatory Visit: Admitting: Physical Therapy

## 2024-01-24 ENCOUNTER — Ambulatory Visit: Admitting: Orthopedic Surgery

## 2024-01-25 ENCOUNTER — Ambulatory Visit: Admitting: Medical-Surgical

## 2024-01-25 ENCOUNTER — Encounter: Payer: Self-pay | Admitting: Family Medicine

## 2024-01-26 ENCOUNTER — Telehealth: Payer: Self-pay

## 2024-01-26 NOTE — Telephone Encounter (Signed)
 Disability - [employer]  Due 01/25/24  Called [employer] to request extension as form was not received today.   Due date extended to 01/31/24.

## 2024-01-31 NOTE — Telephone Encounter (Addendum)
 Form completed, placed on Dr. Virgilio desk to review and sign.   Requesting to remain out of work x 1 year.   Called pt, left VM to call the office.

## 2024-01-31 NOTE — Telephone Encounter (Signed)
 Form reviewed and signed by Dr. Joane, placed at the front for faxing scanning.   Can we hold off on faxing until the end of the day, waiting to hear back from pt.

## 2024-01-31 NOTE — Telephone Encounter (Signed)
 Forwarding to Dr Joane to review and advise. See below:   I have dropped to 25 mg on Losartan  since I saw you as we discussed. Since I already have fatigue, I was wondering how that goes for your patients that take metoprolol?     Also, I need to check with some pharmacies on cost for LDN and I will let you know soon.     Many thanks, Melanie [name]

## 2024-02-01 ENCOUNTER — Ambulatory Visit: Admitting: Sports Medicine

## 2024-02-01 ENCOUNTER — Encounter: Payer: Self-pay | Admitting: Physical Medicine and Rehabilitation

## 2024-02-01 MED ORDER — NALTREXONE HCL (PAIN) 1.5 MG PO CAPS: 1.5000 mg | ORAL_CAPSULE | Freq: Two times a day (BID) | ORAL | 3 refills | Status: DC

## 2024-02-01 NOTE — Addendum Note (Signed)
 Addended by: JOANE ARTIST RAMAN on: 02/01/2024 03:57 PM   Modules accepted: Orders

## 2024-02-02 ENCOUNTER — Encounter: Admitting: Physical Medicine and Rehabilitation

## 2024-02-06 DIAGNOSIS — M5459 Other low back pain: Secondary | ICD-10-CM | POA: Diagnosis not present

## 2024-02-06 DIAGNOSIS — M6281 Muscle weakness (generalized): Secondary | ICD-10-CM | POA: Diagnosis not present

## 2024-02-06 DIAGNOSIS — M542 Cervicalgia: Secondary | ICD-10-CM | POA: Diagnosis not present

## 2024-02-06 DIAGNOSIS — M25561 Pain in right knee: Secondary | ICD-10-CM | POA: Diagnosis not present

## 2024-02-06 DIAGNOSIS — M357 Hypermobility syndrome: Secondary | ICD-10-CM | POA: Diagnosis not present

## 2024-02-06 DIAGNOSIS — M25512 Pain in left shoulder: Secondary | ICD-10-CM | POA: Diagnosis not present

## 2024-02-06 DIAGNOSIS — M25511 Pain in right shoulder: Secondary | ICD-10-CM | POA: Diagnosis not present

## 2024-02-06 DIAGNOSIS — R262 Difficulty in walking, not elsewhere classified: Secondary | ICD-10-CM | POA: Diagnosis not present

## 2024-02-07 ENCOUNTER — Telehealth: Payer: Self-pay

## 2024-02-07 ENCOUNTER — Ambulatory Visit: Admitting: Orthopedic Surgery

## 2024-02-07 ENCOUNTER — Encounter: Payer: Self-pay | Admitting: Orthopedic Surgery

## 2024-02-07 DIAGNOSIS — M79604 Pain in right leg: Secondary | ICD-10-CM | POA: Diagnosis not present

## 2024-02-07 DIAGNOSIS — M7551 Bursitis of right shoulder: Secondary | ICD-10-CM | POA: Diagnosis not present

## 2024-02-07 NOTE — Progress Notes (Signed)
 Office Visit Note   Patient: Melanie [name]           Date of Birth: 19-Apr-1966           MRN: [id number] Visit Date: 02/07/2024 Requested by: Willo Mini, NP [address] [place],  [place] [address] PCP: Willo Mini, NP  Subjective: Chief Complaint  Patient presents with   Other    Follow up right shoulder and back pain    HPI: Melanie [name] is a 58 y.o. female who presents to the office reporting multiple orthopedic complaints.  Patient describes right shoulder and neck pain.  Patient has had a cervical spine MRI which showed fusion with no foraminal stenosis or canal stenosis.  We were considering cervical spine ESI for diagnostic and therapeutic intervention for her shoulder and scapular pain.  Decided against doing that.  She is considering doing physical therapy and dry needling.  She also believes that some of her symptoms could be related to thoracic outlet.  Patient also reports right knee pain.  She had good relief from gel injection in the right knee last summer.  Patient also reports some low back pain and SI joint symptoms.  She localizes it to the SI joint on the right-hand side.  Radiographs from 2022 do show some degenerative disc disease which is mild in the lumbar spine.  She reports some radicular pain as well as episodic paresthesias on the right-hand side.  She also reports some right wrist pain.  She does have a known history of carpal tunnel syndrome from nerve study more than a year old.  She describes having wrist pain symptoms along with clicking in the wrist.  Patient uses Voltaren  as well as occasional Ultram  for her symptoms.  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  She was recently diagnosed with Ehlers-Danlos syndrome.  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                ROS: All systems reviewed are negative as they relate to the chief complaint within the history of present illness.  Patient denies fevers or chills.  Assessment & Plan: Visit Diagnoses:  1. Pain in right leg     Plan: Impression is multiple orthopedic problems preventing her from returning to work as an Nutritional therapist.  Plan at this time is repeat subacromial injection into the shoulder.  Patient does have potentially symptomatic calcific tendinitis in the supraspinatus.  We talked about surgical decompression of that space which is not completely off the table.  However she does have other things going on below the elbow which would not be related to the calcific tendinitis as well as in the neck and scapula which also would not really be helped by any type of shoulder arthroscopy.  We talked about having her see a specialist for evaluation for possible thoracic outlet.  Would like to preapproved her for gel injection in the right knee.  Plan to refer her to Dr. Eldonna for SI joint injection on that right-hand side.  I think some of her wrist symptoms could be related to carpal tunnel but also she does have some laxity in that distal radial ulnar joint which is voluntarily reproducible.  That would require hand surgeon evaluation. Melanie [name] has a  lot of orthopedic areas of concern.  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  Follow-Up Instructions: No follow-ups on file.   Orders:  Orders Placed This Encounter  Procedures   Ambulatory referral to Physical Medicine Rehab   No orders of the defined types were placed in this encounter.     Procedures: Large Joint Inj: R subacromial bursa on 02/07/2024 7:46 PM Indications: diagnostic evaluation and pain Details: 18 G 1.5 in needle, posterior approach  Arthrogram: No  Medications: 9 mL bupivacaine  0.5 %; 5 mL lidocaine  1 %; 40 mg  triamcinolone  acetonide 40 MG/ML Outcome: tolerated well, no immediate complications Procedure, treatment alternatives, risks and benefits explained, specific risks discussed. Consent was given by the patient. Immediately prior to procedure a time out was called to verify the correct patient, procedure, equipment, support staff and site/side marked as required. Patient was prepped and draped in the usual sterile fashion.       Clinical Data: No additional findings.  Objective: Vital Signs: There were no vitals taken for this visit.  Physical Exam:  Constitutional: Patient appears well-developed HEENT:  Head: Normocephalic Eyes:EOM are normal Neck: Normal range of motion Cardiovascular: Normal rate Pulmonary/chest: Effort normal Neurologic: Patient is alert Skin: Skin is warm Psychiatric: Patient has normal mood and affect  Ortho Exam: Ortho exam demonstrates pretty reasonable cervical spine range of motion.  5 out of 5 grip EPL FPL interosseous wrist flexion extension biceps triceps and deltoid strength.  Does have some pain in the anterior right shoulder with range of motion actively and passively at 90 degrees of abduction.  Rotator cuff strength is intact.  Negative Tinel's cubital tunnel bilaterally.  Patient is able to make her distal radial ulnar joint clicks on the right-hand side.  Mild on the left-hand side.  No ECU subluxation with pronation supination in either hand.  Radial pulse intact bilaterally including with abduction and external rotation.  Patient does have some SI joint tenderness on the right.  No nerve root tension signs.  Does have pain with FADIR lateral bending.  Good motor and sensory strength in that right and left lower extremity.  Specialty Comments:  MRI CERVICAL SPINE WITHOUT CONTRAST   TECHNIQUE: Multiplanar, multisequence MR imaging of the cervical spine was performed. No intravenous contrast was administered.   COMPARISON:  11/13/2021    FINDINGS: Alignment: Normal   Vertebrae: Distant ACDF C5 through C7.  Other levels appear normal.   Cord: No cord compression or focal cord lesion.   Posterior Fossa, vertebral arteries, paraspinal tissues: Negative   Disc levels:   No abnormality from the foramen magnum through C2-3.   C3-4: Small central disc protrusion indents the ventral subarachnoid space but does not affect the cord or show foraminal extension. No change.   C4-5: Small central disc protrusion indents the ventral subarachnoid space but does not affect the cord or show foraminal extension. No change.   C5 through C7: Previous ACDF. Good appearance with apparent sufficient patency of the canal and foramina.   C7-T1: No disc abnormality. Minimal facet degeneration. No canal or foraminal stenosis.   IMPRESSION: 1. Previous ACDF C5 through C7. Good appearance with apparent sufficient patency of the canal and foramina. 2. Small central disc protrusions at C3-4 and C4-5 that indent the ventral subarachnoid space but do not affect the cord or show foraminal extension. No change since 11/13/2021. 3. No abnormality seen to explain the patient's right  shoulder symptoms.     Electronically Signed   By: Oneil Officer [name]   On: 10/16/2023 15:43 --------------------------------------- 05/20/2021 Electrodiagnostic study of right upper extremity Impression: 1. 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   2. There is no evidence of a cervical radiculopathy affecting the right upper extremity.     ___________________________ Tonita Blanch, DO  Imaging: No results found.   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   Dysautonomia orthostatic hypotension syndrome 01/18/2024   Menopausal symptoms 09/25/2023   Allergic conjunctivitis of both eyes 05/25/2022    Acute medial meniscus tear of right knee    Plica syndrome of knee, right    Calcific tendinitis of right shoulder 12/24/2021   Carpal tunnel syndrome, right 06/23/2021   HLD (hyperlipidemia) 04/29/2020   Type 2 diabetes mellitus without complication, without long-term current use of insulin  (HCC) 04/24/2020   Tenosynovitis, de Quervain 06/24/2019   Primary osteoarthritis of first carpometacarpal joint of right hand 03/18/2019   Chronic right shoulder pain 03/18/2019   Bilateral foot pain 03/18/2019   Primary osteoarthritis of both knees 03/18/2019   Fibromyalgia 03/15/2019   Lumbar degenerative disc disease 12/19/2018   Myalgia 11/27/2018   Morning stiffness of joints 11/27/2018   DDD (degenerative disc disease), cervical 11/27/2018   Attention deficit hyperactivity disorder (ADHD), predominantly inattentive type 02/27/2018   Arthralgia 02/27/2018   Hypothyroidism 02/27/2018   NASH (nonalcoholic steatohepatitis) 02/27/2018   GAD (generalized anxiety disorder) 01/06/2017   Moderate episode of recurrent major depressive disorder (HCC) 01/06/2017   Environmental allergies 09/12/2015   Essential hypertension 09/12/2015   OSA on CPAP 09/12/2015   Past Medical History:  Diagnosis Date   ADHD (attention deficit hyperactivity disorder)    Arthritis    Bowel obstruction (HCC)    Constipation 07/01/2020   Diabetes mellitus without complication (HCC)    Diverticulosis    Fibromyalgia    H/O: hysterectomy 02/27/2018   History of prediabetes 02/27/2018   Hypertension    Post-operative nausea and vomiting    SBO (small bowel obstruction) (HCC) 04/29/2020   Sleep apnea    uses CPAP    Family History  Problem Relation Age of Onset   Diabetes Mother    Skin cancer Mother    Lung cancer Father    Skin cancer Sister    Colon cancer Neg Hx    Colon polyps Neg Hx    Esophageal cancer Neg Hx    Rectal cancer Neg Hx    Stomach cancer Neg Hx     Past Surgical History:  Procedure  Laterality Date   ANTERIOR CERVICAL DECOMP/DISCECTOMY FUSION N/A 02/02/2022   Procedure: ANTERIOR CERVICAL DECOMPRESSION AND FUSION CERVICAL 5- CERVICAL 6, CERVICAL 6- CERVICAL 7 WITH INSTRUMENTATION AND ALLOGRAFT;  Surgeon: Beuford Oneil, MD;  Location: [facility] OR;  Service: Orthopedics;  Laterality: N/A;   COLONOSCOPY  20 years ago    in [place]=diverticulosis    KNEE ARTHROSCOPY WITH EXCISION PLICA Right 01/18/2022   Procedure: RIGHT KNEE ARTHROSCOPY WITH EXCISION PLICA;  Surgeon: Genelle Standing, MD;  Location: [facility];  Service: Orthopedics;  Laterality: Right;   LAPAROSCOPY     due to bowel perforation   MYOMECTOMY     OOPHORECTOMY     unilateral   TOTAL ABDOMINAL HYSTERECTOMY     Social History   Occupational History   Not on file  Tobacco Use   Smoking status: Never    Passive exposure: Past   Smokeless tobacco: Never  Vaping Use   Vaping status: Never Used  Substance and Sexual Activity   Alcohol use: Yes    Comment: Occassionally   Drug use: Never   Sexual activity: Not Currently    Birth control/protection: Surgical

## 2024-02-07 NOTE — Telephone Encounter (Signed)
 Fax received from [employer] regarding Long Term Disability claim. They state, based on information provided, pt should be able to do sedentary work. Additional information requested as to why pt is unable to do sedentary work.   Called pt, left VM that I would be send MyChart message with draft of letter to review for accuracy and advise if any changes need to be made.

## 2024-02-07 NOTE — Telephone Encounter (Signed)
Auth needed right knee gel

## 2024-02-07 NOTE — Telephone Encounter (Signed)
 Patient called the office to ask if she could speak with Leotis regarding these messages sent in [place]. Please give patient a call when you can.

## 2024-02-08 NOTE — Progress Notes (Deleted)
        Established patient visit   History of Present Illness   Discussed the use of AI scribe software for clinical note transcription with the patient, who gave verbal consent to proceed.  History of Present Illness            Physical Exam   Physical Exam  Assessment & Plan   Assessment and Plan               Follow up   No follow-ups on file.  __________________________________ Zada FREDRIK Palin, DNP, APRN, FNP-BC Primary Care and Sports Medicine [facility] [place]

## 2024-02-08 NOTE — Telephone Encounter (Signed)
Called pt, left VM to call the office.  

## 2024-02-08 NOTE — Telephone Encounter (Signed)
 Form / letter completed and faxed to [facility].

## 2024-02-08 NOTE — Telephone Encounter (Signed)
 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  Functional Limitations:  hEDS causes chronic joint instability, widespread musculoskeletal pain, and frequent subluxations, particularly of the ribs and cervical spine. These events are unpredictable and severely painful, often requiring rest and medical intervention.  POTS results in orthostatic intolerance, tachycardia, dizziness, and fatigue upon standing or sitting upright for extended periods. These symptoms are exacerbated by minimal physical or cognitive exertion, making sustained desk work unfeasible.  MCAS contributes to systemic inflammation, allergic-type reactions, and neurological symptoms such as brain fog, headaches, and sensory sensitivities. These episodes are often triggered by environmental factors that are difficult to control in a workplace setting.  Cervical Radiculopathy and Cervical/Lumbar DDD cause radiating pain, numbness, and weakness in the upper and lower extremities, limiting Emmry's ability to sit, stand, or perform fine motor tasks for prolonged periods.  Recurrent Rib Subluxation leads to acute pain and restricted  mobility, often requiring days of recovery and limiting the ability to sit upright or perform basic movements without discomfort.  Arthralgia, Myalgia, and Chronic Pain Syndromes contribute to persistent pain and fatigue, further reducing functional capacity and tolerance for physical activity.  Upper Extremity Conditions--including OA of the first CMC joint, De Quervain's tenosynovitis, Carpal Tunnel Syndrome, and Calcific Tendinitis--result in pain, weakness, and limited range of motion in the right hand and shoulder, severely restricting Lateia's ability to type, write, lift, or perform repetitive tasks.  Work Capacity:  Due to the unpredictable nature and severity of these symptoms, Ifrah is unable to maintain a regular work schedule or perform the essential duties of even sedentary employment. The combination of chronic pain, autonomic dysfunction, cognitive impairment, and physical instability renders them functionally disabled.  It is my professional opinion that Latisia is medically unable to engage in any gainful employment at this time and for the foreseeable future. I strongly support their application for long-term disability benefits.  Please feel free to contact me directly should you require any additional information or clarification.  Sincerely,

## 2024-02-09 ENCOUNTER — Ambulatory Visit: Admitting: Medical-Surgical

## 2024-02-10 ENCOUNTER — Encounter: Payer: Self-pay | Admitting: Orthopedic Surgery

## 2024-02-10 MED ORDER — BUPIVACAINE HCL 0.5 % IJ SOLN
9.0000 mL | INTRAMUSCULAR | Status: AC | PRN
  Administered 2024-02-07: 9 mL via INTRA_ARTICULAR

## 2024-02-10 MED ORDER — LIDOCAINE HCL 1 % IJ SOLN
5.0000 mL | INTRAMUSCULAR | Status: AC | PRN
  Administered 2024-02-07: 5 mL

## 2024-02-10 MED ORDER — TRIAMCINOLONE ACETONIDE 40 MG/ML IJ SUSP
40.0000 mg | INTRAMUSCULAR | Status: AC | PRN
  Administered 2024-02-07: 40 mg via INTRA_ARTICULAR

## 2024-02-12 ENCOUNTER — Ambulatory Visit: Admitting: Orthopedic Surgery

## 2024-02-12 NOTE — Telephone Encounter (Signed)
 VOB submitted for Monovisc, right knee

## 2024-02-19 DIAGNOSIS — M25512 Pain in left shoulder: Secondary | ICD-10-CM | POA: Diagnosis not present

## 2024-02-19 DIAGNOSIS — M5459 Other low back pain: Secondary | ICD-10-CM | POA: Diagnosis not present

## 2024-02-19 DIAGNOSIS — M542 Cervicalgia: Secondary | ICD-10-CM | POA: Diagnosis not present

## 2024-02-19 DIAGNOSIS — M6281 Muscle weakness (generalized): Secondary | ICD-10-CM | POA: Diagnosis not present

## 2024-02-19 DIAGNOSIS — M357 Hypermobility syndrome: Secondary | ICD-10-CM | POA: Diagnosis not present

## 2024-02-19 DIAGNOSIS — R262 Difficulty in walking, not elsewhere classified: Secondary | ICD-10-CM | POA: Diagnosis not present

## 2024-02-19 DIAGNOSIS — M25561 Pain in right knee: Secondary | ICD-10-CM | POA: Diagnosis not present

## 2024-02-19 DIAGNOSIS — M25511 Pain in right shoulder: Secondary | ICD-10-CM | POA: Diagnosis not present

## 2024-02-20 ENCOUNTER — Telehealth: Payer: Self-pay

## 2024-02-20 NOTE — Telephone Encounter (Signed)
 Completed PA form has been faxed to [facility] at [phone] for Monovisc, right knee PA pending

## 2024-02-26 ENCOUNTER — Encounter: Payer: Self-pay | Admitting: Medical-Surgical

## 2024-02-26 ENCOUNTER — Ambulatory Visit (INDEPENDENT_AMBULATORY_CARE_PROVIDER_SITE_OTHER): Admitting: Medical-Surgical

## 2024-02-26 VITALS — BP 113/72 | HR 67 | Resp 20 | Ht 66.25 in | Wt 220.0 lb

## 2024-02-26 DIAGNOSIS — W57XXXA Bitten or stung by nonvenomous insect and other nonvenomous arthropods, initial encounter: Secondary | ICD-10-CM

## 2024-02-26 DIAGNOSIS — E349 Endocrine disorder, unspecified: Secondary | ICD-10-CM

## 2024-02-26 DIAGNOSIS — F411 Generalized anxiety disorder: Secondary | ICD-10-CM | POA: Diagnosis not present

## 2024-02-26 DIAGNOSIS — E119 Type 2 diabetes mellitus without complications: Secondary | ICD-10-CM

## 2024-02-26 DIAGNOSIS — Q7962 Hypermobile Ehlers-Danlos syndrome: Secondary | ICD-10-CM

## 2024-02-26 DIAGNOSIS — I951 Orthostatic hypotension: Secondary | ICD-10-CM | POA: Diagnosis not present

## 2024-02-26 DIAGNOSIS — L719 Rosacea, unspecified: Secondary | ICD-10-CM | POA: Insufficient documentation

## 2024-02-26 DIAGNOSIS — F900 Attention-deficit hyperactivity disorder, predominantly inattentive type: Secondary | ICD-10-CM

## 2024-02-26 DIAGNOSIS — S80862A Insect bite (nonvenomous), left lower leg, initial encounter: Secondary | ICD-10-CM | POA: Diagnosis not present

## 2024-02-26 LAB — POCT GLYCOSYLATED HEMOGLOBIN (HGB A1C): Hemoglobin A1C: 6 % — AB (ref 4.0–5.6)

## 2024-02-26 MED ORDER — HYDROXYZINE HCL 50 MG PO TABS: 25.0000 mg | ORAL_TABLET | Freq: Three times a day (TID) | ORAL | 1 refills | Status: DC | PRN

## 2024-02-26 MED ORDER — HYDROXYZINE HCL 25 MG PO TABS: 25.0000 mg | ORAL_TABLET | Freq: Three times a day (TID) | ORAL | 1 refills | Status: DC | PRN

## 2024-02-26 NOTE — Progress Notes (Signed)
 Established patient visit   History of Present Illness   Discussed the use of AI scribe software for clinical note transcription with the patient, who gave verbal consent to proceed.  History of Present Illness   Melanie [name] is a 58 year old female with hypermobility EDS and dysautonomia who presents for follow-up on her current treatment regimen.  Chronic pain and stiffness - Hypermobility Ehlers-Danlos syndrome with associated pain and stiffness - Low-dose naltrexone  provides symptomatic relief of pain and stiffness - Initial sleep disturbance with naltrexone , but sleep quality is now improving  ADHD - ADHD managed with Adderall - Adderall intake reduced due to irritability while adjusting to low-dose naltrexone   Type 2 DM - Hemoglobin A1c 6.1% in March - Continue Berberine supplementation  Dermatologic lesion - Painful insect bite from July 31 with delayed healing - Two scabbed lesions on the posterior left leg with a small ring of redness but no infection - Tea tree oil applied for pruritus and irritation  Menopausal symptoms - Estradiol  therapy has resulted in visible changes - Decreased vaginal dryness and reduced frequency of hot flashes  Anxiety and sleep disturbance - Hydroxyzine  used infrequently for anxiety or sleep disturbances - Tablet form preferred for flexible dosing  Hypertension - Losartan  dose reduced to 25 mg daily - No significant adverse effects from medication adjustment     Physical Exam   Physical Exam Vitals reviewed.  Constitutional:      General: She is not in acute distress.    Appearance: Normal appearance.  HENT:     Head: Normocephalic and atraumatic.  Cardiovascular:     Rate and Rhythm: Normal rate and regular rhythm.     Pulses: Normal pulses.     Heart sounds: Normal heart sounds. No murmur heard.    No friction rub. No gallop.  Pulmonary:     Effort: Pulmonary effort is normal. No respiratory distress.      Breath sounds: Normal breath sounds. No wheezing.  Skin:    General: Skin is warm and dry.     Findings: Lesion (scabbed insect bites on the left calf) present.  Neurological:     Mental Status: She is alert and oriented to person, place, and time.  Psychiatric:        Mood and Affect: Mood normal.        Behavior: Behavior normal.        Thought Content: Thought content normal.        Judgment: Judgment normal.    Assessment & Plan   Assessment and Plan    Hypermobility Ehlers-Danlos syndrome with dysautonomia LDN effective for pain and stiffness. Adjustment challenges noted. - Continue low-dose naltrexone  (LDN). - Encourage hydration and electrolyte balance.  Type 2 diabetes mellitus A1c improved from 6.1 to 6.0 with berberine. - Continue berberine. - Re-evaluate A1c in six months unless indicated sooner.  Essential hypertension Well-controlled with losartan  25 mg. - Continue losartan  25 mg.  Perimenopausal symptoms Improved with estradiol . Concerns about patch adherence. - Continue estradiol  therapy.  Attention-deficit hyperactivity disorder Managed with reduced Adderall dosage due to LDN adjustment. - Continue prn Adderall dosage.  Anxiety disorder Occasional symptoms managed with hydroxyzine . - Prescribe hydroxyzine  50mg  tablets for dosing flexibility. - Advise use up to three times daily as needed.  Healing insect bite Healing without infection, irritation present. - Continue tea tree oil for symptom relief.      Follow up   Return in about 6 months (around 08/28/2024)  for DM/HTN/HLD follow up. __________________________________ Zada FREDRIK Palin, DNP, APRN, FNP-BC Primary Care and Sports Medicine [name]

## 2024-02-28 ENCOUNTER — Other Ambulatory Visit: Payer: Self-pay

## 2024-02-28 ENCOUNTER — Telehealth: Payer: Self-pay

## 2024-02-28 DIAGNOSIS — G8929 Other chronic pain: Secondary | ICD-10-CM

## 2024-02-28 NOTE — Telephone Encounter (Signed)
 Talked with patient and she will call back when she is ready to proceed with gel injection with Dr. Addie.  See referrals tab

## 2024-02-29 DIAGNOSIS — M25512 Pain in left shoulder: Secondary | ICD-10-CM | POA: Diagnosis not present

## 2024-02-29 DIAGNOSIS — M25561 Pain in right knee: Secondary | ICD-10-CM | POA: Diagnosis not present

## 2024-02-29 DIAGNOSIS — M357 Hypermobility syndrome: Secondary | ICD-10-CM | POA: Diagnosis not present

## 2024-02-29 DIAGNOSIS — M5459 Other low back pain: Secondary | ICD-10-CM | POA: Diagnosis not present

## 2024-02-29 DIAGNOSIS — M542 Cervicalgia: Secondary | ICD-10-CM | POA: Diagnosis not present

## 2024-02-29 DIAGNOSIS — R262 Difficulty in walking, not elsewhere classified: Secondary | ICD-10-CM | POA: Diagnosis not present

## 2024-02-29 DIAGNOSIS — M6281 Muscle weakness (generalized): Secondary | ICD-10-CM | POA: Diagnosis not present

## 2024-02-29 DIAGNOSIS — M25511 Pain in right shoulder: Secondary | ICD-10-CM | POA: Diagnosis not present

## 2024-03-01 ENCOUNTER — Other Ambulatory Visit: Payer: Self-pay | Admitting: Medical-Surgical

## 2024-03-01 DIAGNOSIS — Z1231 Encounter for screening mammogram for malignant neoplasm of breast: Secondary | ICD-10-CM

## 2024-03-12 ENCOUNTER — Ambulatory Visit: Admitting: Medical-Surgical

## 2024-03-13 DIAGNOSIS — M357 Hypermobility syndrome: Secondary | ICD-10-CM | POA: Diagnosis not present

## 2024-03-13 DIAGNOSIS — M25511 Pain in right shoulder: Secondary | ICD-10-CM | POA: Diagnosis not present

## 2024-03-13 DIAGNOSIS — M6281 Muscle weakness (generalized): Secondary | ICD-10-CM | POA: Diagnosis not present

## 2024-03-13 DIAGNOSIS — M5459 Other low back pain: Secondary | ICD-10-CM | POA: Diagnosis not present

## 2024-03-13 DIAGNOSIS — M542 Cervicalgia: Secondary | ICD-10-CM | POA: Diagnosis not present

## 2024-03-13 DIAGNOSIS — R262 Difficulty in walking, not elsewhere classified: Secondary | ICD-10-CM | POA: Diagnosis not present

## 2024-03-13 DIAGNOSIS — M25512 Pain in left shoulder: Secondary | ICD-10-CM | POA: Diagnosis not present

## 2024-03-13 DIAGNOSIS — M25561 Pain in right knee: Secondary | ICD-10-CM | POA: Diagnosis not present

## 2024-03-18 ENCOUNTER — Ambulatory Visit: Admitting: Orthopedic Surgery

## 2024-03-18 DIAGNOSIS — M542 Cervicalgia: Secondary | ICD-10-CM | POA: Diagnosis not present

## 2024-03-18 DIAGNOSIS — M25512 Pain in left shoulder: Secondary | ICD-10-CM | POA: Diagnosis not present

## 2024-03-18 DIAGNOSIS — M5459 Other low back pain: Secondary | ICD-10-CM | POA: Diagnosis not present

## 2024-03-18 DIAGNOSIS — R262 Difficulty in walking, not elsewhere classified: Secondary | ICD-10-CM | POA: Diagnosis not present

## 2024-03-18 DIAGNOSIS — M25561 Pain in right knee: Secondary | ICD-10-CM | POA: Diagnosis not present

## 2024-03-18 DIAGNOSIS — M357 Hypermobility syndrome: Secondary | ICD-10-CM | POA: Diagnosis not present

## 2024-03-18 DIAGNOSIS — M25511 Pain in right shoulder: Secondary | ICD-10-CM | POA: Diagnosis not present

## 2024-03-18 DIAGNOSIS — M6281 Muscle weakness (generalized): Secondary | ICD-10-CM | POA: Diagnosis not present

## 2024-03-27 ENCOUNTER — Encounter: Payer: Self-pay | Admitting: Family Medicine

## 2024-03-27 ENCOUNTER — Other Ambulatory Visit (HOSPITAL_COMMUNITY): Payer: Self-pay

## 2024-03-27 ENCOUNTER — Ambulatory Visit (INDEPENDENT_AMBULATORY_CARE_PROVIDER_SITE_OTHER): Admitting: Family Medicine

## 2024-03-27 ENCOUNTER — Other Ambulatory Visit: Payer: Self-pay | Admitting: Physical Medicine and Rehabilitation

## 2024-03-27 VITALS — BP 128/78 | HR 78 | Ht 66.25 in | Wt 210.0 lb

## 2024-03-27 DIAGNOSIS — M461 Sacroiliitis, not elsewhere classified: Secondary | ICD-10-CM

## 2024-03-27 DIAGNOSIS — M25561 Pain in right knee: Secondary | ICD-10-CM | POA: Diagnosis not present

## 2024-03-27 DIAGNOSIS — M5459 Other low back pain: Secondary | ICD-10-CM | POA: Diagnosis not present

## 2024-03-27 DIAGNOSIS — Q7962 Hypermobile Ehlers-Danlos syndrome: Secondary | ICD-10-CM | POA: Diagnosis not present

## 2024-03-27 DIAGNOSIS — M791 Myalgia, unspecified site: Secondary | ICD-10-CM | POA: Diagnosis not present

## 2024-03-27 DIAGNOSIS — M542 Cervicalgia: Secondary | ICD-10-CM | POA: Diagnosis not present

## 2024-03-27 DIAGNOSIS — R262 Difficulty in walking, not elsewhere classified: Secondary | ICD-10-CM | POA: Diagnosis not present

## 2024-03-27 DIAGNOSIS — G894 Chronic pain syndrome: Secondary | ICD-10-CM

## 2024-03-27 DIAGNOSIS — I951 Orthostatic hypotension: Secondary | ICD-10-CM

## 2024-03-27 DIAGNOSIS — L42 Pityriasis rosea: Secondary | ICD-10-CM

## 2024-03-27 DIAGNOSIS — M6281 Muscle weakness (generalized): Secondary | ICD-10-CM | POA: Diagnosis not present

## 2024-03-27 DIAGNOSIS — M25512 Pain in left shoulder: Secondary | ICD-10-CM | POA: Diagnosis not present

## 2024-03-27 DIAGNOSIS — M25511 Pain in right shoulder: Secondary | ICD-10-CM | POA: Diagnosis not present

## 2024-03-27 DIAGNOSIS — M357 Hypermobility syndrome: Secondary | ICD-10-CM | POA: Diagnosis not present

## 2024-03-27 MED ORDER — DIAZEPAM 5 MG PO TABS: ORAL_TABLET | ORAL | 0 refills | Status: DC

## 2024-03-27 MED ORDER — TRIAMCINOLONE ACETONIDE 0.1 % EX CREA
1.0000 | TOPICAL_CREAM | Freq: Two times a day (BID) | CUTANEOUS | 6 refills | Status: DC
  Filled 2024-03-27: qty 120, 30d supply, fill #0

## 2024-03-27 NOTE — Progress Notes (Unsigned)
   I, Leotis Batter, CMA acting as a scribe for Artist Lloyd, MD.  Melanie [name] is a 58 y.o. female who presents to [facility] today for f/u hEDS. Pt was last seen by Dr. Lloyd on 01/17/24 and was advised to use compression, salt/fluid intake, and lifestyle modifications.  Also referred to [facility].  Today, pt reports new rash for the past 2 weeks on the chest and stomach. Takes Xyzal  at times. Tightness in the right lower back / SI joint / and ribs. Planning on having SI joint injection. Shoulders sublux at times. PT has been helpful for wrist sx, with manipulation and taping. Has slight leg length discrepancy. Has been focusing on staying hydrated and increasing sodium intake, which has helped some with sx.   Pertinent review of systems: No fevers or chills.  Positive for rash across torso that is a bit itchy.    Relevant historical information: Ehlers-Danlos syndrome.   Exam:  BP 128/78   Pulse 78   Ht 5' 6.25 (1.683 m)   Wt 210 lb (95.3 kg)   SpO2 96%   BMI 33.64 kg/m  General: Well Developed, well nourished, and in no acute distress.   MSK: Normal upper and lower extremity motion.  Normal gait.  Skin: Rash macular erythematous spot in rash across trunk and a bit of a Christmas tree pattern.  No obvious herald patch.       Assessment and Plan: 58 y.o. female with Ehlers-Danlos syndrome doing okay.  From my orthopedic situation she is scheduled to have a SI joint injection with Dr. Eldonna in about a week which I think could be helpful.  We can initiate hyaluronic acid injections in her knee whenever she would like she will let me know.  We talked about mast cell activation syndrome.  Continue Xyzal  and add famotidine.  Patient is feeling a bit better from dysautonomia with fluids and hydration and salt appropriately.  As for the rash looks consistent with pityriasis rosea.  Describe what this rash is and what it means.   Will go ahead and prescribe triamcinolone  cream to use as needed.  Recheck in about 3 months. PDMP not reviewed this encounter. No orders of the defined types were placed in this encounter.  Meds ordered this encounter  Medications   triamcinolone  cream (KENALOG ) 0.1 %    Sig: Apply 1 Application topically 2 (two) times daily.    Dispense:  454 g    Refill:  6     Discussed warning signs or symptoms. Please see discharge instructions. Patient expresses understanding.   The above documentation has been reviewed and is accurate and complete Artist Lloyd, [name]

## 2024-03-27 NOTE — Patient Instructions (Signed)
 Thank you for coming in today.   Recommend Famotidine  Steroid cream prescribed for your rash  Keep appointment with Dr. Eldonna for your injection  Let us  know if you would like to try a gel shot for your knee  See you back in 3 months.

## 2024-03-28 ENCOUNTER — Telehealth: Payer: Self-pay | Admitting: Family Medicine

## 2024-03-28 ENCOUNTER — Other Ambulatory Visit (HOSPITAL_COMMUNITY): Payer: Self-pay

## 2024-03-28 ENCOUNTER — Other Ambulatory Visit: Payer: Self-pay

## 2024-03-28 MED ORDER — TRIAMCINOLONE ACETONIDE 0.1 % EX CREA: 1.0000 | TOPICAL_CREAM | Freq: Two times a day (BID) | CUTANEOUS | 6 refills | Status: AC

## 2024-03-28 NOTE — Telephone Encounter (Signed)
 Triamcinolone  cream sent to [facility] in error, can we cancel and resend to [facility]?

## 2024-03-28 NOTE — Telephone Encounter (Signed)
 Rx sent to [facility]

## 2024-03-29 ENCOUNTER — Other Ambulatory Visit: Payer: Self-pay | Admitting: Medical-Surgical

## 2024-04-01 MED ORDER — TRAMADOL HCL 50 MG PO TABS: 50.0000 mg | ORAL_TABLET | Freq: Two times a day (BID) | ORAL | 0 refills | Status: DC | PRN

## 2024-04-02 ENCOUNTER — Other Ambulatory Visit: Payer: Self-pay | Admitting: Medical-Surgical

## 2024-04-02 DIAGNOSIS — I10 Essential (primary) hypertension: Secondary | ICD-10-CM

## 2024-04-02 DIAGNOSIS — Z9109 Other allergy status, other than to drugs and biological substances: Secondary | ICD-10-CM

## 2024-04-08 ENCOUNTER — Other Ambulatory Visit: Payer: Self-pay

## 2024-04-08 ENCOUNTER — Ambulatory Visit: Admitting: Physical Medicine and Rehabilitation

## 2024-04-08 VITALS — BP 118/81 | HR 84

## 2024-04-08 DIAGNOSIS — M461 Sacroiliitis, not elsewhere classified: Secondary | ICD-10-CM | POA: Diagnosis not present

## 2024-04-08 NOTE — Progress Notes (Signed)
 Melanie [name] - 58 y.o. female MRN [id number]  Date of birth: 04/17/1966  Office Visit Note: Visit Date: 04/08/2024 PCP: Willo Mini, NP Referred by: Willo Mini, NP  Subjective: Chief Complaint  Patient presents with   Right Hip - Pain   HPI:  Melanie [name] is a 58 y.o. female who comes in today at the request of Dr. JUDITHANN Glendia Hutchinson for planned Right anesthetic Sacroiliac joint arthrogram with fluoroscopic guidance.  The patient has failed conservative care including home exercise, medications, time and activity modification.  This injection will be diagnostic and hopefully therapeutic.  Please see requesting physician notes for further details and justification.   Positive Fortin finger sign, Patrick's testing.   ROS Otherwise per HPI.  Assessment & Plan: Visit Diagnoses:    ICD-10-CM   1. Sacroiliitis  M46.1 XR C-ARM NO REPORT    Sacroiliac Joint Inj -Right      Plan: No additional findings.   Meds & Orders: No orders of the defined types were placed in this encounter.   Orders Placed This Encounter  Procedures   Sacroiliac Joint Inj -Right   XR C-ARM NO REPORT    Follow-up: No follow-ups on file.   Procedures: Sacroiliac Joint Inj -Right on 04/08/2024 3:09 PM Indications: pain and diagnostic evaluation Details: 22 G 3.5 in needle, fluoroscopy-guided posterior approach Medications: 2 mL bupivacaine  0.5 %; 40 mg methylPREDNISolone  acetate 80 MG/ML; 40 mg methylPREDNISolone  acetate 40 MG/ML Outcome: tolerated well, no immediate complications  Sacroiliac Joint Intra-Articular Injection - Posterior Approach with Fluoroscopic Guidance   Position: PRONE  Additional Comments: Vital signs were monitored before and after the procedure. Patient was prepped and draped in the usual sterile fashion. The correct patient, procedure, and site was verified.   Injection Procedure Details:   Location/Site:  Sacroiliac joint  Needle size: 3.5 in Spinal  Needle  Needle type: Spinal  Needle Placement: Intra-articular  Findings:  -Comments: There was excellent flow of contrast producing a partial arthrogram of the sacroiliac joint.   Procedure Details: Starting with a 90 degree vertical and midline orientation the fluoroscope was tilted cranially 20 to 25 degrees and the target area of the inferior most part of the SI joint on the side mentioned above was visualized.  The soft tissues overlying this target were infiltrated with 4 ml. of 1% Lidocaine  without Epinephrine . A #22 gauge spinal needle was inserted perpendicular to the fluoroscope table and advanced into the posterior inferior joint space using fluoroscopic guidance.  Position in the joint space was confirmed by obtaining a partial arthrogram using a 2 ml. volume of Isovue -250 contrast agent. After negative aspirate for gross pus or blood, the injectate was delivered to the joint. Radiographs were obtained for documentation purposes.   Additional Comments:   Dressing: Bandaid    Post-procedure details: Patient was observed during the procedure. Post-procedure instructions were reviewed.  Patient left the clinic in stable condition.    There was excellent flow of contrast producing a partial arthrogram of the sacroiliac joint.  Procedure, treatment alternatives, risks and benefits explained, specific risks discussed. Consent was given by the patient. Immediately prior to procedure a time out was called to verify the correct patient, procedure, equipment, support staff and site/side marked as required. Patient was prepped and draped in the usual sterile fashion.          Clinical History: MRI CERVICAL SPINE WITHOUT CONTRAST   TECHNIQUE: Multiplanar, multisequence MR imaging of the cervical spine was performed. No  intravenous contrast was administered.   COMPARISON:  11/13/2021   FINDINGS: Alignment: Normal   Vertebrae: Distant ACDF C5 through C7.  Other levels  appear normal.   Cord: No cord compression or focal cord lesion.   Posterior Fossa, vertebral arteries, paraspinal tissues: Negative   Disc levels:   No abnormality from the foramen magnum through C2-3.   C3-4: Small central disc protrusion indents the ventral subarachnoid space but does not affect the cord or show foraminal extension. No change.   C4-5: Small central disc protrusion indents the ventral subarachnoid space but does not affect the cord or show foraminal extension. No change.   C5 through C7: Previous ACDF. Good appearance with apparent sufficient patency of the canal and foramina.   C7-T1: No disc abnormality. Minimal facet degeneration. No canal or foraminal stenosis.   IMPRESSION: 1. Previous ACDF C5 through C7. Good appearance with apparent sufficient patency of the canal and foramina. 2. Small central disc protrusions at C3-4 and C4-5 that indent the ventral subarachnoid space but do not affect the cord or show foraminal extension. No change since 11/13/2021. 3. No abnormality seen to explain the patient's right shoulder symptoms.     Electronically Signed   By: Oneil Officer [name]   On: 10/16/2023 15:43 --------------------------------------- 05/20/2021 Electrodiagnostic study of right upper extremity Impression: 1. 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   2. There is no evidence of a cervical radiculopathy affecting the right upper extremity.     ___________________________ Tonita Blanch, DO     Objective:  VS:  HT:    WT:   BMI:     BP:118/81  HR:84bpm  TEMP: ( )  RESP:  Physical Exam Vitals and nursing note reviewed.  Constitutional:      General: She is not in acute distress.    Appearance: Normal appearance. She is not ill-appearing.  HENT:     Head: Normocephalic and atraumatic.     Right Ear: External ear normal.     Left Ear: External ear normal.   Eyes:     Extraocular Movements: Extraocular movements intact.  Cardiovascular:     Rate and Rhythm: Normal rate.     Pulses: Normal pulses.  Pulmonary:     Effort: Pulmonary effort is normal. No respiratory distress.  Abdominal:     General: There is no distension.     Palpations: Abdomen is soft.  Musculoskeletal:        General: Tenderness present.     Cervical back: Neck supple.     Right lower leg: No edema.     Left lower leg: No edema.     Comments: Patient has good distal strength with no pain over the greater trochanters.  No clonus or focal weakness.  Skin:    Findings: No erythema, lesion or rash.  Neurological:     General: No focal deficit present.     Mental Status: She is alert and oriented to person, place, and time.     Sensory: No sensory deficit.     Motor: No weakness or abnormal muscle tone.     Coordination: Coordination normal.  Psychiatric:        Mood and Affect: Mood normal.        Behavior: Behavior normal.      Imaging: No results found.

## 2024-04-08 NOTE — Progress Notes (Signed)
 Pain Scale   Average Pain 4 Patient advising she has chronic lower hip area on right side pain        +Driver, -BT, -Dye Allergies.

## 2024-04-15 MED ORDER — METHYLPREDNISOLONE ACETATE 40 MG/ML IJ SUSP
40.0000 mg | INTRAMUSCULAR | Status: AC | PRN
  Administered 2024-04-08: 40 mg via INTRA_ARTICULAR

## 2024-04-15 MED ORDER — METHYLPREDNISOLONE ACETATE 80 MG/ML IJ SUSP
40.0000 mg | INTRAMUSCULAR | Status: AC | PRN
  Administered 2024-04-08: 40 mg via INTRA_ARTICULAR

## 2024-04-15 MED ORDER — BUPIVACAINE HCL 0.5 % IJ SOLN
2.0000 mL | INTRAMUSCULAR | Status: AC | PRN
  Administered 2024-04-08: 2 mL via INTRA_ARTICULAR

## 2024-04-16 ENCOUNTER — Telehealth: Payer: Self-pay

## 2024-04-16 NOTE — Telephone Encounter (Signed)
 Please check benefits for gel shot series for right knee OA.

## 2024-04-16 NOTE — Telephone Encounter (Signed)
 Melanie [name] to [facility] (supporting Artist GORMAN Lloyd, MD) (Selected Message)     04/15/24 12:31 PM Thank for for doing that. I have also gotten famotidine.  I would like to try gel injections in the knee spaced out over 3x if my insurance company will allow. Otherwise, Dr Addie has an approval for the single dose. Can that be done with you guys instead?

## 2024-04-16 NOTE — Telephone Encounter (Signed)
 Ran benefits for right knee orhtovisc case ID [id number]

## 2024-04-17 DIAGNOSIS — M6281 Muscle weakness (generalized): Secondary | ICD-10-CM | POA: Diagnosis not present

## 2024-04-17 DIAGNOSIS — M357 Hypermobility syndrome: Secondary | ICD-10-CM | POA: Diagnosis not present

## 2024-04-17 DIAGNOSIS — M25511 Pain in right shoulder: Secondary | ICD-10-CM | POA: Diagnosis not present

## 2024-04-17 DIAGNOSIS — R262 Difficulty in walking, not elsewhere classified: Secondary | ICD-10-CM | POA: Diagnosis not present

## 2024-04-17 DIAGNOSIS — M542 Cervicalgia: Secondary | ICD-10-CM | POA: Diagnosis not present

## 2024-04-17 DIAGNOSIS — M25561 Pain in right knee: Secondary | ICD-10-CM | POA: Diagnosis not present

## 2024-04-17 DIAGNOSIS — M25512 Pain in left shoulder: Secondary | ICD-10-CM | POA: Diagnosis not present

## 2024-04-17 DIAGNOSIS — M5459 Other low back pain: Secondary | ICD-10-CM | POA: Diagnosis not present

## 2024-04-18 ENCOUNTER — Ambulatory Visit

## 2024-04-18 DIAGNOSIS — Z1231 Encounter for screening mammogram for malignant neoplasm of breast: Secondary | ICD-10-CM | POA: Diagnosis not present

## 2024-04-22 DIAGNOSIS — M357 Hypermobility syndrome: Secondary | ICD-10-CM | POA: Diagnosis not present

## 2024-04-22 DIAGNOSIS — R262 Difficulty in walking, not elsewhere classified: Secondary | ICD-10-CM | POA: Diagnosis not present

## 2024-04-22 DIAGNOSIS — M542 Cervicalgia: Secondary | ICD-10-CM | POA: Diagnosis not present

## 2024-04-22 DIAGNOSIS — M25511 Pain in right shoulder: Secondary | ICD-10-CM | POA: Diagnosis not present

## 2024-04-22 DIAGNOSIS — M25512 Pain in left shoulder: Secondary | ICD-10-CM | POA: Diagnosis not present

## 2024-04-22 DIAGNOSIS — M5459 Other low back pain: Secondary | ICD-10-CM | POA: Diagnosis not present

## 2024-04-22 DIAGNOSIS — M25561 Pain in right knee: Secondary | ICD-10-CM | POA: Diagnosis not present

## 2024-04-22 DIAGNOSIS — M6281 Muscle weakness (generalized): Secondary | ICD-10-CM | POA: Diagnosis not present

## 2024-04-23 ENCOUNTER — Ambulatory Visit: Payer: Self-pay | Admitting: Medical-Surgical

## 2024-04-24 NOTE — Telephone Encounter (Signed)
 Insurance needed a pre cert faxed the form to [employer]

## 2024-04-26 ENCOUNTER — Ambulatory Visit: Admitting: Physical Medicine and Rehabilitation

## 2024-04-26 DIAGNOSIS — M7918 Myalgia, other site: Secondary | ICD-10-CM

## 2024-04-26 DIAGNOSIS — G8929 Other chronic pain: Secondary | ICD-10-CM | POA: Diagnosis not present

## 2024-04-26 DIAGNOSIS — M25511 Pain in right shoulder: Secondary | ICD-10-CM

## 2024-04-26 DIAGNOSIS — M542 Cervicalgia: Secondary | ICD-10-CM | POA: Diagnosis not present

## 2024-04-26 DIAGNOSIS — M898X1 Other specified disorders of bone, shoulder: Secondary | ICD-10-CM | POA: Diagnosis not present

## 2024-04-26 NOTE — Progress Notes (Signed)
 Pain Scale   Average Pain 4 Patient advising her pain is from her neck down her right side.        +Driver, -BT, -Dye Allergies.

## 2024-04-29 DIAGNOSIS — M5459 Other low back pain: Secondary | ICD-10-CM | POA: Diagnosis not present

## 2024-04-29 DIAGNOSIS — R262 Difficulty in walking, not elsewhere classified: Secondary | ICD-10-CM | POA: Diagnosis not present

## 2024-04-29 DIAGNOSIS — M25512 Pain in left shoulder: Secondary | ICD-10-CM | POA: Diagnosis not present

## 2024-04-29 DIAGNOSIS — M25561 Pain in right knee: Secondary | ICD-10-CM | POA: Diagnosis not present

## 2024-04-29 DIAGNOSIS — M25511 Pain in right shoulder: Secondary | ICD-10-CM | POA: Diagnosis not present

## 2024-04-29 DIAGNOSIS — M542 Cervicalgia: Secondary | ICD-10-CM | POA: Diagnosis not present

## 2024-04-29 DIAGNOSIS — M6281 Muscle weakness (generalized): Secondary | ICD-10-CM | POA: Diagnosis not present

## 2024-04-29 DIAGNOSIS — M357 Hypermobility syndrome: Secondary | ICD-10-CM | POA: Diagnosis not present

## 2024-04-29 NOTE — Telephone Encounter (Signed)
 Please schedule patient when medication is stocked   Orthovisc authorized for right knee Pre cert required  Copay $80 Patient responsible for 40% coinsurance Deductible $3000 has met $3000 OOP MAX $6400 has met $3990.64 Once OOP has been met patient coverage goes to 100$ AUTHORIZATION NUMBER [id number] 04/24/24-04/23/2025

## 2024-04-30 NOTE — Telephone Encounter (Signed)
 Scheduled

## 2024-05-01 ENCOUNTER — Encounter: Payer: Self-pay | Admitting: Physical Medicine and Rehabilitation

## 2024-05-01 MED ORDER — LIDOCAINE HCL (PF) 1 % IJ SOLN
5.0000 mL | INTRAMUSCULAR | Status: AC | PRN
  Administered 2024-04-26: 5 mL

## 2024-05-01 NOTE — Progress Notes (Signed)
 Melanie [name] - 58 y.o. female MRN [id number]  Date of birth: 1965/09/20  Office Visit Note: Visit Date: 04/26/2024 PCP: Willo Mini, NP Referred by: Willo Mini, NP  Subjective: Chief Complaint  Patient presents with   Neck - Pain   HPI:  Melanie [name] is a 58 y.o. female who comes in today for evaluation and management of chronic right neck shoulder and shoulder blade pain.  This is a chronic situation where she has tried really almost everything.  She has had physical therapy and different injections and medications really without much relief.  This been going on quite some time.  Please see prior notes for further review and justification.  I do think that trying a trigger point injection in selected muscles will likely help her symptoms.   Review of Systems  Musculoskeletal:  Positive for back pain, joint pain and neck pain.  All other systems reviewed and are negative.  Otherwise per HPI.  Assessment & Plan: Visit Diagnoses:    ICD-10-CM   1. Cervicalgia  M54.2     2. Chronic right shoulder pain  M25.511    G89.29     3. Pain of right scapula  M89.8X1     4. Myofascial pain syndrome  M79.18       Plan: No additional findings.   Meds & Orders: No orders of the defined types were placed in this encounter.   Orders Placed This Encounter  Procedures   Trigger Point Inj    Follow-up: No follow-ups on file.   Procedures: Trigger Point Inj  Date/Time: 04/26/2024 10:00 AM  Performed by: Eldonna Novel, MD Authorized by: Eldonna Novel, MD   Consent Given by:  Patient Site marked: the procedure site was marked   Timeout: prior to procedure the correct patient, procedure, and site was verified   Indications:  Pain Total # of Trigger Points:  3 or more Location: neck, back and shoulder   Needle Size:  25 G Approach:  Dorsal Medications #1:  5 mL lidocaine  (PF) 1 % Additional Injections?: No   Patient tolerance:  Patient tolerated the  procedure well with no immediate complications Comments: Trigger points were palpated along the supraspinatus levator scapula and rhomboid at the mid to inferior angle of the right scapula.  Separate trigger point palpated in the paraspinal musculature in the lower lumbar area.  These trigger points were injected with lidocaine  and a needling technique.       Clinical History: MRI CERVICAL SPINE WITHOUT CONTRAST   TECHNIQUE: Multiplanar, multisequence MR imaging of the cervical spine was performed. No intravenous contrast was administered.   COMPARISON:  11/13/2021   FINDINGS: Alignment: Normal   Vertebrae: Distant ACDF C5 through C7.  Other levels appear normal.   Cord: No cord compression or focal cord lesion.   Posterior Fossa, vertebral arteries, paraspinal tissues: Negative   Disc levels:   No abnormality from the foramen magnum through C2-3.   C3-4: Small central disc protrusion indents the ventral subarachnoid space but does not affect the cord or show foraminal extension. No change.   C4-5: Small central disc protrusion indents the ventral subarachnoid space but does not affect the cord or show foraminal extension. No change.   C5 through C7: Previous ACDF. Good appearance with apparent sufficient patency of the canal and foramina.   C7-T1: No disc abnormality. Minimal facet degeneration. No canal or foraminal stenosis.   IMPRESSION: 1. Previous ACDF C5 through C7. Good appearance with apparent sufficient  patency of the canal and foramina. 2. Small central disc protrusions at C3-4 and C4-5 that indent the ventral subarachnoid space but do not affect the cord or show foraminal extension. No change since 11/13/2021. 3. No abnormality seen to explain the patient's right shoulder symptoms.     Electronically Signed   By: Oneil Officer [name]   On: 10/16/2023 15:43 --------------------------------------- 05/20/2021 Electrodiagnostic study of right upper  extremity Impression: 1. 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   2. There is no evidence of a cervical radiculopathy affecting the right upper extremity.     ___________________________ Tonita Blanch, DO     Objective:  VS:  HT:    WT:   BMI:     BP:   HR: bpm  TEMP: ( )  RESP:  Physical Exam Vitals and nursing note reviewed.  Constitutional:      General: She is not in acute distress.    Appearance: Normal appearance. She is well-developed. She is not ill-appearing.  HENT:     Head: Normocephalic and atraumatic.  Eyes:     Conjunctiva/sclera: Conjunctivae normal.     Pupils: Pupils are equal, round, and reactive to light.  Cardiovascular:     Rate and Rhythm: Normal rate.     Pulses: Normal pulses.  Pulmonary:     Effort: Pulmonary effort is normal.  Musculoskeletal:        General: Tenderness present.     Right lower leg: No edema.     Left lower leg: No edema.     Comments: Trigger points palpated as noted in the procedure note.  The patient does have some limited range of motion neck rotation and some scapular dysfunction with movement.  She has good strength in the upper extremities.  Skin:    General: Skin is warm and dry.     Findings: No erythema or rash.  Neurological:     General: No focal deficit present.     Mental Status: She is alert and oriented to person, place, and time.     Cranial Nerves: No cranial nerve deficit.     Sensory: No sensory deficit.     Motor: No weakness or abnormal muscle tone.     Coordination: Coordination normal.     Gait: Gait normal.  Psychiatric:        Mood and Affect: Mood normal.        Behavior: Behavior normal.      Imaging: No results found.

## 2024-05-01 NOTE — Telephone Encounter (Signed)
 Please check coverage and prior auth requirements for [facility] for RIGHT knee OA.

## 2024-05-01 NOTE — Telephone Encounter (Signed)
 Luckily I accidentally ran her for Monovisc in the first place so that one is also approved, I called insurance to get them to re-fax the approval letter so I can scan it in.  Information stays the exact same with everything except it is not MONOVISC injection AUTHORIZATION NUMBER [id number] 04/24/24-04/23/25

## 2024-05-01 NOTE — Telephone Encounter (Signed)
 Scheduled for monovisc.

## 2024-05-01 NOTE — Telephone Encounter (Signed)
 Per pt message:  Melanie [name] to [facility] Clinical (supporting Artist GORMAN Lloyd, MD) (Selected Message)     04/30/24  5:58 PM Hi Leotis,  I didn't think about having three copays this way, which are $80 each. I doubt there is any way around that. I may have to just do the one injection. I apologize because I know you and others had work to get approval for 3 injections.

## 2024-05-06 ENCOUNTER — Encounter: Payer: Self-pay | Admitting: Radiology

## 2024-05-08 ENCOUNTER — Ambulatory Visit: Admitting: Family Medicine

## 2024-05-12 ENCOUNTER — Encounter: Payer: Self-pay | Admitting: Medical-Surgical

## 2024-05-12 DIAGNOSIS — F900 Attention-deficit hyperactivity disorder, predominantly inattentive type: Secondary | ICD-10-CM

## 2024-05-13 MED ORDER — AMPHETAMINE-DEXTROAMPHETAMINE 10 MG PO TABS: ORAL_TABLET | ORAL | 0 refills | Status: AC

## 2024-05-15 ENCOUNTER — Ambulatory Visit: Admitting: Family Medicine

## 2024-05-15 ENCOUNTER — Ambulatory Visit: Attending: Family Medicine

## 2024-05-15 ENCOUNTER — Other Ambulatory Visit: Payer: Self-pay

## 2024-05-15 VITALS — BP 110/70 | HR 82 | Ht 66.25 in | Wt 219.0 lb

## 2024-05-15 DIAGNOSIS — Q7962 Hypermobile Ehlers-Danlos syndrome: Secondary | ICD-10-CM | POA: Diagnosis not present

## 2024-05-15 DIAGNOSIS — M25561 Pain in right knee: Secondary | ICD-10-CM

## 2024-05-15 DIAGNOSIS — R002 Palpitations: Secondary | ICD-10-CM | POA: Diagnosis not present

## 2024-05-15 DIAGNOSIS — M170 Bilateral primary osteoarthritis of knee: Secondary | ICD-10-CM | POA: Diagnosis not present

## 2024-05-15 DIAGNOSIS — G8929 Other chronic pain: Secondary | ICD-10-CM

## 2024-05-15 DIAGNOSIS — M797 Fibromyalgia: Secondary | ICD-10-CM

## 2024-05-15 DIAGNOSIS — M1711 Unilateral primary osteoarthritis, right knee: Secondary | ICD-10-CM | POA: Diagnosis not present

## 2024-05-15 MED ORDER — HYALURONAN 88 MG/4ML IX SOSY
88.0000 mg | PREFILLED_SYRINGE | Freq: Once | INTRA_ARTICULAR | Status: AC
  Administered 2024-05-15: 88 mg via INTRA_ARTICULAR

## 2024-05-15 NOTE — Progress Notes (Unsigned)
 EP to read.

## 2024-05-15 NOTE — Progress Notes (Signed)
 I, Melanie [name] am a scribe for Dr. Artist Lloyd, MD.  Melanie [name] is a 58 y.o. female who presents to [facility] at [facility] today for R knee Monovisc injection. Pt was last seen by Dr. Lloyd on 03/27/24 for hEDS.  Today, pt reports that she is here for Monovisc injection for the right knee. Doing the salt and fluid intake. It has helped a lot with the symptoms. The weather change has been helpful. No fluid retention from the salt intake. The rash on the torso has gone away. Got the flu shot and she was flushed. Has a rash on the hands after washing the dogs and wearing gloves. Less tacy than last visit. Si injection, trigger point, and body work to low back and hip recently and started to feel really good on the right side. The LDN helps with the pain and stiffness. Would like to increase it wbut wants to get the doctor's thoughts on it first. Concerned about affording insurance come January.   Additionally she notes some palpitations.  She notes that in the past she think she has been diagnosed with mitral valve prolapse but cannot recall.  She has not had much of a heart workup.  Dx testing; 05/16/22 R knee MRI  Pertinent review of systems: No fevers or chills  Relevant historical information: Ehlers-Danlos syndrome and dysautonomia.   Exam:  BP 110/70   Pulse 82   Ht 5' 6.25 (1.683 m)   Wt 219 lb (99.3 kg)   SpO2 95%   BMI 35.08 kg/m  General: Well Developed, well nourished, and in no acute distress.   MSK: Right knee mild effusion normal-appearing otherwise normal motion.    Lab and Radiology Results  [facility] [name] presents to clinic today for Monovisc injection right knee 1/1 Procedure: Real-time Ultrasound Guided Injection of right knee joint superior lateral patellar space Device: Philips Affiniti 50G/GE Logiq Images permanently stored and available for review in PACS Verbal informed consent obtained.  Discussed risks and  benefits of procedure. Warned about infection, bleeding, damage to structures among others. Patient expresses understanding and agreement Time-out conducted.   Noted no overlying erythema, induration, or other signs of local infection.   Skin prepped in a sterile fashion.   Local anesthesia: Topical Ethyl chloride.   With sterile technique and under real time ultrasound guidance: Monovisc 4 mL injected into knee joint. Fluid seen entering the joint capsule.   Completed without difficulty   Advised to call if fevers/chills, erythema, induration, drainage, or persistent bleeding.   Images permanently stored and available for review in the ultrasound unit.  Impression: Technically successful ultrasound guided injection. Lot number: [id number]    Assessment and Plan: 58 y.o. female with right knee pain due to DJD.   Plan for Monovisc injection today.  Additionally we talked about overall pain attributable to fibromyalgia and hypermobile Ehlers-Danlos syndrome.  She has had benefit with low-dose naltrexone .  Plan to consider increasing dose to 2 mg.  She will let me know.  Additionally we talked about palpitations and lightheadedness and dizziness.  Most likely due to dysautonomia/POTS but patient would benefit from a more thorough cardiac workup.  Plan for echocardiogram and long-term monitor.  She will likely be losing her health insurance in the near.  We talked about strategies for that.  Happy to continue seeing her if needed.  Provided handout for [facility] charity program.   PDMP not reviewed this encounter. Orders Placed This Encounter  Procedures  US  LIMITED JOINT SPACE STRUCTURES LOW RIGHT(NO LINKED CHARGES)    Reason for Exam (SYMPTOM  OR DIAGNOSIS REQUIRED):   knee pain    Preferred imaging location?:   [facility]   LONG TERM MONITOR (3-14 DAYS)    Standing Status:   Future    Number of Occurrences:   1    Expiration Date:   05/15/2025    Where should this  test be performed?:   [facility]    Does the patient have an implanted cardiac device?:   No    Prescribed days of wear:   7    Type of enrollment:   Clinic Enrollment    Reason for Exam:   Tachycardia R00.0   ECHOCARDIOGRAM COMPLETE    Standing Status:   Future    Expected Date:   06/14/2024    Expiration Date:   05/15/2025    Where [facility] this test [facility] performed[facility]    Does the patient weigh less than or greater than 250 lbs?:   Patient weighs less than 250 lbs    Perflutren DEFINITY (image enhancing agent) should be administered unless hypersensitivity or allergy exist:   Administer Perflutren    Reason for exam-Echo:   Other abnormalities of the heart R00.8   Meds ordered this encounter  Medications   Hyaluronan (MONOVISC) intra-articular injection 88 mg     Discussed warning signs or symptoms. Please see discharge instructions. Patient expresses understanding.   The above documentation has been reviewed and is accurate and complete Artist Lloyd, [name]

## 2024-05-15 NOTE — Patient Instructions (Addendum)
 Thank you for coming in today.   You received an injection today. Seek immediate medical attention if the joint becomes red, extremely painful, or is oozing fluid.   I've ordered an Echocardiogram & Long-term Heart Monitor. You should hear soon about setting those up.

## 2024-05-22 ENCOUNTER — Ambulatory Visit: Admitting: Family Medicine

## 2024-05-27 ENCOUNTER — Telehealth: Payer: Self-pay | Admitting: Family Medicine

## 2024-05-27 NOTE — Telephone Encounter (Signed)
 Patient called to let Dr Joane know that she had to delay using the heart monitor due to being sick. She is going to start using it soon but wanted Dr Joane to be aware.

## 2024-05-28 NOTE — Telephone Encounter (Signed)
 Forwarding to Dr. Denyse Amass to review and advise.

## 2024-05-31 DIAGNOSIS — G4733 Obstructive sleep apnea (adult) (pediatric): Secondary | ICD-10-CM | POA: Diagnosis not present

## 2024-06-04 ENCOUNTER — Encounter (HOSPITAL_COMMUNITY): Payer: Self-pay | Admitting: Family Medicine

## 2024-06-06 DIAGNOSIS — M542 Cervicalgia: Secondary | ICD-10-CM | POA: Diagnosis not present

## 2024-06-06 DIAGNOSIS — M5459 Other low back pain: Secondary | ICD-10-CM | POA: Diagnosis not present

## 2024-06-06 DIAGNOSIS — M6281 Muscle weakness (generalized): Secondary | ICD-10-CM | POA: Diagnosis not present

## 2024-06-06 DIAGNOSIS — M25511 Pain in right shoulder: Secondary | ICD-10-CM | POA: Diagnosis not present

## 2024-06-06 DIAGNOSIS — M25512 Pain in left shoulder: Secondary | ICD-10-CM | POA: Diagnosis not present

## 2024-06-06 DIAGNOSIS — R262 Difficulty in walking, not elsewhere classified: Secondary | ICD-10-CM | POA: Diagnosis not present

## 2024-06-06 DIAGNOSIS — M25561 Pain in right knee: Secondary | ICD-10-CM | POA: Diagnosis not present

## 2024-06-06 DIAGNOSIS — M357 Hypermobility syndrome: Secondary | ICD-10-CM | POA: Diagnosis not present

## 2024-06-07 ENCOUNTER — Other Ambulatory Visit: Payer: Self-pay | Admitting: Medical-Surgical

## 2024-06-07 ENCOUNTER — Ambulatory Visit (HOSPITAL_COMMUNITY)

## 2024-06-07 DIAGNOSIS — Z9109 Other allergy status, other than to drugs and biological substances: Secondary | ICD-10-CM

## 2024-06-10 NOTE — Telephone Encounter (Signed)
 Requesting rx rf of ipratropium 0.03%  Last written 03/22025 Last OV 12/07/2023 Upcoming appt 08/29/2024

## 2024-06-13 ENCOUNTER — Encounter: Payer: Self-pay | Admitting: Family Medicine

## 2024-06-13 NOTE — Telephone Encounter (Signed)
 Forwarding to Dr. Joane to review pt message and refill Tramadol .

## 2024-06-14 DIAGNOSIS — M25511 Pain in right shoulder: Secondary | ICD-10-CM | POA: Diagnosis not present

## 2024-06-14 DIAGNOSIS — M5459 Other low back pain: Secondary | ICD-10-CM | POA: Diagnosis not present

## 2024-06-14 DIAGNOSIS — M542 Cervicalgia: Secondary | ICD-10-CM | POA: Diagnosis not present

## 2024-06-14 DIAGNOSIS — M25512 Pain in left shoulder: Secondary | ICD-10-CM | POA: Diagnosis not present

## 2024-06-14 DIAGNOSIS — M6281 Muscle weakness (generalized): Secondary | ICD-10-CM | POA: Diagnosis not present

## 2024-06-14 DIAGNOSIS — M25561 Pain in right knee: Secondary | ICD-10-CM | POA: Diagnosis not present

## 2024-06-14 DIAGNOSIS — R262 Difficulty in walking, not elsewhere classified: Secondary | ICD-10-CM | POA: Diagnosis not present

## 2024-06-14 DIAGNOSIS — M357 Hypermobility syndrome: Secondary | ICD-10-CM | POA: Diagnosis not present

## 2024-06-14 MED ORDER — TRAMADOL HCL 50 MG PO TABS: 50.0000 mg | ORAL_TABLET | Freq: Two times a day (BID) | ORAL | 0 refills | Status: AC | PRN

## 2024-06-20 DIAGNOSIS — M797 Fibromyalgia: Secondary | ICD-10-CM

## 2024-06-20 DIAGNOSIS — M1711 Unilateral primary osteoarthritis, right knee: Secondary | ICD-10-CM

## 2024-06-20 DIAGNOSIS — Q7962 Hypermobile Ehlers-Danlos syndrome: Secondary | ICD-10-CM

## 2024-06-20 DIAGNOSIS — R002 Palpitations: Secondary | ICD-10-CM

## 2024-06-20 DIAGNOSIS — G8929 Other chronic pain: Secondary | ICD-10-CM

## 2024-06-20 DIAGNOSIS — M170 Bilateral primary osteoarthritis of knee: Secondary | ICD-10-CM

## 2024-06-21 ENCOUNTER — Ambulatory Visit: Payer: Self-pay | Admitting: Family Medicine

## 2024-06-21 DIAGNOSIS — M6281 Muscle weakness (generalized): Secondary | ICD-10-CM | POA: Diagnosis not present

## 2024-06-21 DIAGNOSIS — M25512 Pain in left shoulder: Secondary | ICD-10-CM | POA: Diagnosis not present

## 2024-06-21 DIAGNOSIS — R262 Difficulty in walking, not elsewhere classified: Secondary | ICD-10-CM | POA: Diagnosis not present

## 2024-06-21 DIAGNOSIS — M25561 Pain in right knee: Secondary | ICD-10-CM | POA: Diagnosis not present

## 2024-06-21 DIAGNOSIS — M25511 Pain in right shoulder: Secondary | ICD-10-CM | POA: Diagnosis not present

## 2024-06-21 DIAGNOSIS — M357 Hypermobility syndrome: Secondary | ICD-10-CM | POA: Diagnosis not present

## 2024-06-21 DIAGNOSIS — M5459 Other low back pain: Secondary | ICD-10-CM | POA: Diagnosis not present

## 2024-06-21 DIAGNOSIS — M542 Cervicalgia: Secondary | ICD-10-CM | POA: Diagnosis not present

## 2024-06-21 NOTE — Progress Notes (Signed)
 7-day monitor does not show any dangerous rhythm.  This is great news.

## 2024-06-25 ENCOUNTER — Ambulatory Visit: Admitting: Family Medicine

## 2024-06-25 NOTE — Progress Notes (Deleted)
"       ° °  LILLETTE Ileana Collet, PhD, LAT, ATC acting as a scribe for Artist Lloyd, MD.  Melanie [name] is a 58 y.o. female who presents to [facility] at [facility] today for f/u fibromyalgia and hEDS. Pt was last seen by Dr. Lloyd on 05/15/24 and her R knee was injected w/ Monovisc and naltrexone  increased to 2mg . Echo and long-term monitor ordered.  Today, pt reports ***  Dx testing: 06/17/24 Long term monitor 05/16/22 R knee MRI   Pertinent review of systems: ***  Relevant historical information: ***   Exam:  There were no vitals taken for this visit. General: Well Developed, well nourished, and in no acute distress.   MSK: ***    Lab and Radiology Results No results found for this or any previous visit (from the past 72 hours). No results found.     Assessment and Plan: 58 y.o. female with ***   PDMP not reviewed this encounter. No orders of the defined types were placed in this encounter.  No orders of the defined types were placed in this encounter.    Discussed warning signs or symptoms. Please see discharge instructions. Patient expresses understanding.   ***  "

## 2024-06-26 ENCOUNTER — Encounter: Payer: Self-pay | Admitting: Physical Medicine and Rehabilitation

## 2024-06-26 ENCOUNTER — Encounter: Payer: Self-pay | Admitting: Family Medicine

## 2024-06-28 ENCOUNTER — Encounter: Payer: Self-pay | Admitting: Medical-Surgical

## 2024-06-28 ENCOUNTER — Ambulatory Visit: Admitting: Medical-Surgical

## 2024-06-28 VITALS — BP 126/87 | HR 79 | Temp 98.6°F | Resp 20 | Ht 66.25 in | Wt 224.0 lb

## 2024-06-28 DIAGNOSIS — R519 Headache, unspecified: Secondary | ICD-10-CM

## 2024-06-28 DIAGNOSIS — F338 Other recurrent depressive disorders: Secondary | ICD-10-CM | POA: Diagnosis not present

## 2024-06-28 DIAGNOSIS — E119 Type 2 diabetes mellitus without complications: Secondary | ICD-10-CM | POA: Diagnosis not present

## 2024-06-28 LAB — POCT UA - MICROALBUMIN
Albumin/Creatinine Ratio, Urine, POC: <30
Creatinine, POC: 50 mg/dL
Microalbumin Ur, POC: 10 mg/L

## 2024-06-28 LAB — POCT GLYCOSYLATED HEMOGLOBIN (HGB A1C)
HbA1c, POC (controlled diabetic range): 6 % (ref 0.0–7.0)
Hemoglobin A1C: 6 % — AB (ref 4.0–5.6)

## 2024-06-28 MED ORDER — SERTRALINE HCL 50 MG PO TABS: ORAL_TABLET | ORAL | 3 refills | Status: DC

## 2024-06-28 NOTE — Progress Notes (Signed)
 "       Established patient visit   History of Present Illness   Discussed the use of AI scribe software for clinical note transcription with the patient, who gave verbal consent to proceed.  History of Present Illness   Melanie [name] is a 58 year old female with dysautonomia and hypertension who presents for follow-up of her blood pressure and behavioral health concerns.  Blood pressure management and dysautonomia - Hypertension with comorbid dysautonomia and POTS. - Currently taking half of prescribed losartan  dose due to dysautonomia and POTS. - Occasional low blood pressure readings in the 110s/60s. - Believes today's blood pressure measurement may be inaccurate due to use of a large cuff.  Anxiety and seasonal mood disturbance - Seasonal worsening of anxiety and feeling overwhelmed during fall and winter. - Describes mood as 'existing, not living.' - Interested in starting sertraline  for anxiety and mood symptoms. - Previous use of Wellbutrin  with mixed results. - No use of sertraline  or Lexapro  for over three years.  Headache and neurological symptoms - Continuous headaches for the past month, worsened by weather changes. - Pain originates at the occiput and radiates to the face. - Associated symptoms include nausea and tingling. - Chronic difference in sensation between right and left foot since knee surgery, with right foot feeling tingly and 'in the needles.'  Glycemic control - Recent hemoglobin A1c is 6.0, unchanged from prior measurements.  Stimulant medication use - Currently taking Adderall with an active prescription on file.      Physical Exam   Physical Exam Vitals reviewed.  Constitutional:      General: She is not in acute distress.    Appearance: Normal appearance. She is well-developed. She is obese. She is not ill-appearing.  HENT:     Head: Normocephalic and atraumatic.  Cardiovascular:     Rate and Rhythm: Normal rate and regular rhythm.      Pulses: Normal pulses.     Heart sounds: Normal heart sounds. No murmur heard.    No friction rub. No gallop.  Pulmonary:     Effort: Pulmonary effort is normal. No respiratory distress.     Breath sounds: Normal breath sounds. No wheezing.  Skin:    General: Skin is warm and dry.  Neurological:     Mental Status: She is alert and oriented to person, place, and time.  Psychiatric:        Mood and Affect: Mood normal.        Behavior: Behavior normal.        Thought Content: Thought content normal.        Judgment: Judgment normal.    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 Previous injections beneficial. Neurologist referral and MRI considered. - Referred to neurologist for further evaluation and management. - Ordered MRI of the brain without contrast to rule out other causes. - Connect to see if injections are an option.   Seasonal affective disorder with anxiety Seasonal affective disorder with anxiety, Wellbutrin  ineffective. Sertraline  considered due to family history of efficacy. Discussed GeneSight testing. - Ordered GeneSight test to guide medication choice. - Prescribed sertraline  50 mg, start with half a tablet once daily for one week, then increase to a full tablet if tolerated. - Discussed potential side effects of sertraline , including headaches, nausea, and fatigue, which typically resolve within two weeks.  Type 2  diabetes mellitus without complication, without long-term current use of insulin  (HCC)  Well-controlled with A1c of 6.0. - Continue current diabetes management plan.  Hypertension Blood pressure slightly elevated, possibly situational. Currently on reduced losartan  dose due to dysautonomia. - Rechecked blood pressure- 126/87. - Continue losartan  25mg  daily, with potential to increase dose if blood pressure remains  elevated.  General Health Maintenance Discussed transition to Medicare and need for annual wellness visit with EKG. Safety measures considered. - Will schedule annual wellness visit with EKG.   Follow up   Return for mood follow up in 4-6 weeks. __________________________________ Zada FREDRIK Palin, DNP, APRN, FNP-BC Primary Care and Sports Medicine [facility] [place] "

## 2024-07-01 ENCOUNTER — Ambulatory Visit: Admitting: Family Medicine

## 2024-07-03 ENCOUNTER — Ambulatory Visit (HOSPITAL_BASED_OUTPATIENT_CLINIC_OR_DEPARTMENT_OTHER)
Admission: RE | Admit: 2024-07-03 | Discharge: 2024-07-03 | Disposition: A | Discharge status: Home / Self Care | Source: Ambulatory Visit | Attending: Family Medicine | Admitting: Family Medicine

## 2024-07-03 DIAGNOSIS — R002 Palpitations: Secondary | ICD-10-CM | POA: Diagnosis present

## 2024-07-03 DIAGNOSIS — I361 Nonrheumatic tricuspid (valve) insufficiency: Secondary | ICD-10-CM | POA: Diagnosis not present

## 2024-07-04 LAB — ECHOCARDIOGRAM COMPLETE
AR max vel: 2.43 cm2
AV Area VTI: 2.52 cm2
AV Area mean vel: 2.34 cm2
AV Mean grad: 3 mmHg
AV Peak grad: 4.8 mmHg
Ao pk vel: 1.09 m/s
Area-P 1/2: 2.84 cm2
Calc EF: 61.1 %
S' Lateral: 2.4 cm
Single Plane A2C EF: 61.1 %
Single Plane A4C EF: 60 %

## 2024-07-05 NOTE — Progress Notes (Signed)
 Echocardiogram looks okay

## 2024-07-07 ENCOUNTER — Other Ambulatory Visit: Payer: Self-pay | Admitting: Medical-Surgical

## 2024-07-07 DIAGNOSIS — N951 Menopausal and female climacteric states: Secondary | ICD-10-CM

## 2024-07-11 ENCOUNTER — Encounter: Payer: Self-pay | Admitting: Family Medicine

## 2024-07-11 ENCOUNTER — Ambulatory Visit: Admitting: Family Medicine

## 2024-07-11 VITALS — BP 122/84 | HR 82 | Ht 66.25 in | Wt 222.0 lb

## 2024-07-11 DIAGNOSIS — R002 Palpitations: Secondary | ICD-10-CM | POA: Diagnosis not present

## 2024-07-11 DIAGNOSIS — M5481 Occipital neuralgia: Secondary | ICD-10-CM | POA: Diagnosis not present

## 2024-07-11 DIAGNOSIS — Q7962 Hypermobile Ehlers-Danlos syndrome: Secondary | ICD-10-CM | POA: Diagnosis not present

## 2024-07-11 MED ORDER — METOPROLOL SUCCINATE ER 25 MG PO TB24: 12.5000 mg | ORAL_TABLET | Freq: Every day | ORAL | 3 refills | Status: AC

## 2024-07-11 NOTE — Progress Notes (Signed)
"       ° °  I, Leotis Batter, CMA acting as a scribe for Artist Lloyd, MD.  Melanie [name] is a 59 y.o. female who presents to [facility] at [facility] today for f/u fibromyalgia and hEDS. Pt was last seen by Dr. Lloyd on 05/15/24 and her R knee was injected w/ Monovisc and naltrexone  increased to 2mg . Echo and long-term monitor ordered.  Today, pt reports not increasing Naltrexone  yet, would like to discuss today. Reports slight improvement of right knee sx with Monovisc injection. Continues having pain wit pivoting and stepping down on the stairs. Would like to discuss results of heart monitor and Echo. Notes worsening HA's over the past month. Reports HA 4 out of 7 days weekly, lasting at least 1 day each. Having almost daily HA's in Dec 2025.  Headaches are associated with posterior neck pain.  Dx testing: 06/17/24 Long term monitor 05/16/22 R knee MRI   Pertinent review of systems: No fevers or chills  Relevant historical information: Hypertension dysautonomia diabetes EDS.   Exam:  BP 122/84   Pulse 82   Ht 5' 6.25 (1.683 m)   Wt 222 lb (100.7 kg)   SpO2 99%   BMI 35.56 kg/m  General: Well Developed, well nourished, and in no acute distress.   MSK: C-spine: Normal-appearing Nontender palpation spinal midline.  Tender palpation cervical paraspinal musculature.    Lab and Radiology Results  Trigger point injection: Consent obtained and timeout performed. 20 mg of Kenalog  and 2 mL of Marcaine  were injected each side for a total of 40 mg of Kenalog  at the superior cervical paraspinal musculature area.  Skin was cleaned with isopropyl alcohol prior to injection.  Patient tolerated procedure well.     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  For neck pain already has had a good trial of physical therapy.  Plan for trigger point injections today.  This should help neck pain and headache  from occipital neuralgia.  POTS: Reviewed heart monitor and echocardiogram.  Plan for low-dose metoprolol .   PDMP not reviewed this encounter. No orders of the defined types were placed in this encounter.  Meds ordered this encounter  Medications   metoprolol  succinate (TOPROL  XL) 25 MG 24 hr tablet    Sig: Take 0.5 tablets (12.5 mg total) by mouth daily.    Dispense:  30 tablet    Refill:  3     Discussed warning signs or symptoms. Please see discharge instructions. Patient expresses understanding.   The above documentation has been reviewed and is accurate and complete Artist Lloyd, [name]   "

## 2024-07-11 NOTE — Patient Instructions (Addendum)
 Thank you for coming in today.   Call or go to the ER if you develop a large red swollen joint with extreme pain or oozing puss.    Lets try the metoprolol   Try the naltraxone twice daily.

## 2024-07-22 ENCOUNTER — Other Ambulatory Visit: Payer: Self-pay | Admitting: Medical-Surgical

## 2024-07-22 DIAGNOSIS — F338 Other recurrent depressive disorders: Secondary | ICD-10-CM

## 2024-07-25 ENCOUNTER — Encounter: Payer: Self-pay | Admitting: Medical-Surgical

## 2024-07-25 DIAGNOSIS — F338 Other recurrent depressive disorders: Secondary | ICD-10-CM

## 2024-07-26 ENCOUNTER — Ambulatory Visit: Admitting: Medical-Surgical

## 2024-07-26 DIAGNOSIS — F338 Other recurrent depressive disorders: Secondary | ICD-10-CM

## 2024-07-26 MED ORDER — SERTRALINE HCL 50 MG PO TABS: 50.0000 mg | ORAL_TABLET | Freq: Every day | ORAL | 3 refills | Status: DC

## 2024-07-26 MED ORDER — SERTRALINE HCL 50 MG PO TABS: 50.0000 mg | ORAL_TABLET | Freq: Every day | ORAL | 1 refills | Status: AC

## 2024-08-02 ENCOUNTER — Encounter: Payer: Self-pay | Admitting: Family Medicine

## 2024-08-02 DIAGNOSIS — M797 Fibromyalgia: Secondary | ICD-10-CM

## 2024-08-02 DIAGNOSIS — Q7962 Hypermobile Ehlers-Danlos syndrome: Secondary | ICD-10-CM

## 2024-08-02 DIAGNOSIS — M791 Myalgia, unspecified site: Secondary | ICD-10-CM

## 2024-08-02 MED ORDER — NALTREXONE HCL (PAIN) 1.5 MG PO CAPS: 1.5000 mg | ORAL_CAPSULE | Freq: Two times a day (BID) | ORAL | 3 refills | Status: AC

## 2024-08-04 ENCOUNTER — Other Ambulatory Visit: Payer: Self-pay | Admitting: Medical-Surgical

## 2024-08-04 DIAGNOSIS — N951 Menopausal and female climacteric states: Secondary | ICD-10-CM

## 2024-08-07 ENCOUNTER — Other Ambulatory Visit: Payer: Self-pay | Admitting: Medical-Surgical

## 2024-08-07 DIAGNOSIS — Z9109 Other allergy status, other than to drugs and biological substances: Secondary | ICD-10-CM

## 2024-08-29 ENCOUNTER — Ambulatory Visit: Admitting: Medical-Surgical
